# Patient Record
Sex: Female | Born: 1975 | Race: Black or African American | Hispanic: No | Smoking: Former smoker
Health system: Southern US, Community
[De-identification: ages and names within clinical notes are randomized; demographics above are authoritative.]

## PROBLEM LIST (undated history)

## (undated) DIAGNOSIS — E119 Type 2 diabetes mellitus without complications: Secondary | ICD-10-CM

## (undated) DIAGNOSIS — F32A Depression, unspecified: Secondary | ICD-10-CM

## (undated) DIAGNOSIS — G43909 Migraine, unspecified, not intractable, without status migrainosus: Secondary | ICD-10-CM

## (undated) DIAGNOSIS — E785 Hyperlipidemia, unspecified: Secondary | ICD-10-CM

## (undated) DIAGNOSIS — R569 Unspecified convulsions: Secondary | ICD-10-CM

## (undated) DIAGNOSIS — F329 Major depressive disorder, single episode, unspecified: Secondary | ICD-10-CM

## (undated) DIAGNOSIS — I10 Essential (primary) hypertension: Secondary | ICD-10-CM

## (undated) HISTORY — PX: CYST EXCISION: SHX5701

## (undated) HISTORY — DX: Depression, unspecified: F32.A

## (undated) HISTORY — DX: Hyperlipidemia, unspecified: E78.5

## (undated) HISTORY — PX: TUBAL LIGATION: SHX77

## (undated) HISTORY — PX: SHOULDER OPEN ROTATOR CUFF REPAIR: SHX2407

## (undated) HISTORY — PX: CHOLECYSTECTOMY: SHX55

## (undated) HISTORY — PX: TONSILLECTOMY: SUR1361

---
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
## 2003-11-11 ENCOUNTER — Emergency Department (HOSPITAL_COMMUNITY): Admission: EM | Admit: 2003-11-11 | Discharge: 2003-11-12 | Payer: Self-pay | Admitting: Emergency Medicine

## 2005-08-24 ENCOUNTER — Observation Stay (HOSPITAL_COMMUNITY): Admission: AD | Admit: 2005-08-24 | Discharge: 2005-08-25 | Payer: Self-pay | Admitting: Obstetrics & Gynecology

## 2005-08-31 ENCOUNTER — Ambulatory Visit (HOSPITAL_COMMUNITY): Admission: AD | Admit: 2005-08-31 | Discharge: 2005-09-01 | Payer: Self-pay | Admitting: Obstetrics & Gynecology

## 2005-09-22 ENCOUNTER — Inpatient Hospital Stay (HOSPITAL_COMMUNITY): Admission: RE | Admit: 2005-09-22 | Discharge: 2005-09-24 | Payer: Self-pay | Admitting: Obstetrics and Gynecology

## 2005-09-30 ENCOUNTER — Ambulatory Visit (HOSPITAL_COMMUNITY): Admission: AD | Admit: 2005-09-30 | Discharge: 2005-09-30 | Payer: Self-pay | Admitting: Obstetrics & Gynecology

## 2005-10-10 ENCOUNTER — Ambulatory Visit (HOSPITAL_COMMUNITY): Admission: AD | Admit: 2005-10-10 | Discharge: 2005-10-10 | Payer: Self-pay | Admitting: Obstetrics and Gynecology

## 2005-10-13 ENCOUNTER — Ambulatory Visit (HOSPITAL_COMMUNITY): Admission: AD | Admit: 2005-10-13 | Discharge: 2005-10-13 | Payer: Self-pay | Admitting: Obstetrics and Gynecology

## 2005-10-17 ENCOUNTER — Ambulatory Visit (HOSPITAL_COMMUNITY): Admission: AD | Admit: 2005-10-17 | Discharge: 2005-10-17 | Payer: Self-pay | Admitting: Obstetrics & Gynecology

## 2005-10-20 ENCOUNTER — Ambulatory Visit (HOSPITAL_COMMUNITY): Admission: AD | Admit: 2005-10-20 | Discharge: 2005-10-20 | Payer: Self-pay | Admitting: Obstetrics & Gynecology

## 2005-10-24 ENCOUNTER — Ambulatory Visit (HOSPITAL_COMMUNITY): Admission: AD | Admit: 2005-10-24 | Discharge: 2005-10-24 | Payer: Self-pay | Admitting: Obstetrics & Gynecology

## 2005-10-27 ENCOUNTER — Ambulatory Visit (HOSPITAL_COMMUNITY): Admission: AD | Admit: 2005-10-27 | Discharge: 2005-10-27 | Payer: Self-pay | Admitting: Obstetrics & Gynecology

## 2005-10-31 ENCOUNTER — Ambulatory Visit (HOSPITAL_COMMUNITY): Admission: AD | Admit: 2005-10-31 | Discharge: 2005-10-31 | Payer: Self-pay | Admitting: Obstetrics & Gynecology

## 2005-11-03 ENCOUNTER — Ambulatory Visit (HOSPITAL_COMMUNITY): Admission: AD | Admit: 2005-11-03 | Discharge: 2005-11-03 | Payer: Self-pay | Admitting: Obstetrics & Gynecology

## 2005-11-10 ENCOUNTER — Ambulatory Visit (HOSPITAL_COMMUNITY): Admission: AD | Admit: 2005-11-10 | Discharge: 2005-11-10 | Payer: Self-pay | Admitting: Obstetrics & Gynecology

## 2005-11-16 ENCOUNTER — Encounter (INDEPENDENT_AMBULATORY_CARE_PROVIDER_SITE_OTHER): Payer: Self-pay | Admitting: Specialist

## 2005-11-16 ENCOUNTER — Inpatient Hospital Stay (HOSPITAL_COMMUNITY): Admission: RE | Admit: 2005-11-16 | Discharge: 2005-11-19 | Payer: Self-pay | Admitting: Obstetrics & Gynecology

## 2006-08-26 ENCOUNTER — Emergency Department (HOSPITAL_COMMUNITY): Admission: EM | Admit: 2006-08-26 | Discharge: 2006-08-26 | Payer: Self-pay | Admitting: Emergency Medicine

## 2008-05-01 ENCOUNTER — Ambulatory Visit: Payer: Self-pay | Admitting: Orthopedic Surgery

## 2008-05-01 DIAGNOSIS — M7580 Other shoulder lesions, unspecified shoulder: Secondary | ICD-10-CM

## 2008-05-01 DIAGNOSIS — M25519 Pain in unspecified shoulder: Secondary | ICD-10-CM | POA: Insufficient documentation

## 2008-06-02 ENCOUNTER — Encounter (HOSPITAL_COMMUNITY): Admission: RE | Admit: 2008-06-02 | Discharge: 2008-07-03 | Payer: Self-pay | Admitting: Orthopedic Surgery

## 2008-06-11 ENCOUNTER — Encounter: Payer: Self-pay | Admitting: Orthopedic Surgery

## 2008-06-27 ENCOUNTER — Encounter: Payer: Self-pay | Admitting: Orthopedic Surgery

## 2008-06-30 ENCOUNTER — Ambulatory Visit: Payer: Self-pay | Admitting: Orthopedic Surgery

## 2008-07-08 ENCOUNTER — Encounter (HOSPITAL_COMMUNITY): Admission: RE | Admit: 2008-07-08 | Discharge: 2008-08-07 | Payer: Self-pay | Admitting: Orthopedic Surgery

## 2008-07-14 ENCOUNTER — Encounter: Payer: Self-pay | Admitting: Orthopedic Surgery

## 2008-07-22 ENCOUNTER — Ambulatory Visit (HOSPITAL_COMMUNITY): Admission: RE | Admit: 2008-07-22 | Discharge: 2008-07-22 | Payer: Self-pay | Admitting: Family Medicine

## 2008-09-09 ENCOUNTER — Ambulatory Visit: Payer: Self-pay | Admitting: Orthopedic Surgery

## 2008-09-12 ENCOUNTER — Encounter (INDEPENDENT_AMBULATORY_CARE_PROVIDER_SITE_OTHER): Payer: Self-pay | Admitting: *Deleted

## 2008-09-12 ENCOUNTER — Encounter: Payer: Self-pay | Admitting: Orthopedic Surgery

## 2008-09-15 ENCOUNTER — Ambulatory Visit (HOSPITAL_COMMUNITY): Admission: RE | Admit: 2008-09-15 | Discharge: 2008-09-15 | Payer: Self-pay | Admitting: Orthopedic Surgery

## 2008-09-15 ENCOUNTER — Encounter: Payer: Self-pay | Admitting: Orthopedic Surgery

## 2008-09-19 ENCOUNTER — Encounter: Payer: Self-pay | Admitting: Orthopedic Surgery

## 2008-09-22 ENCOUNTER — Ambulatory Visit: Payer: Self-pay | Admitting: Orthopedic Surgery

## 2008-09-25 ENCOUNTER — Telehealth: Payer: Self-pay | Admitting: Orthopedic Surgery

## 2008-09-30 ENCOUNTER — Ambulatory Visit: Payer: Self-pay | Admitting: Orthopedic Surgery

## 2008-09-30 ENCOUNTER — Ambulatory Visit (HOSPITAL_COMMUNITY): Admission: RE | Admit: 2008-09-30 | Discharge: 2008-09-30 | Payer: Self-pay | Admitting: Orthopedic Surgery

## 2008-10-02 ENCOUNTER — Ambulatory Visit: Payer: Self-pay | Admitting: Orthopedic Surgery

## 2008-10-06 ENCOUNTER — Encounter (HOSPITAL_COMMUNITY): Admission: RE | Admit: 2008-10-06 | Discharge: 2008-11-05 | Payer: Self-pay | Admitting: Orthopedic Surgery

## 2008-10-06 ENCOUNTER — Encounter: Payer: Self-pay | Admitting: Orthopedic Surgery

## 2008-10-15 ENCOUNTER — Telehealth: Payer: Self-pay | Admitting: Orthopedic Surgery

## 2008-10-15 ENCOUNTER — Encounter: Payer: Self-pay | Admitting: Orthopedic Surgery

## 2008-10-16 ENCOUNTER — Telehealth: Payer: Self-pay | Admitting: Orthopedic Surgery

## 2008-10-20 ENCOUNTER — Ambulatory Visit: Payer: Self-pay | Admitting: Orthopedic Surgery

## 2008-10-22 ENCOUNTER — Emergency Department (HOSPITAL_COMMUNITY): Admission: EM | Admit: 2008-10-22 | Discharge: 2008-10-22 | Payer: Self-pay | Admitting: Emergency Medicine

## 2008-10-24 ENCOUNTER — Encounter: Payer: Self-pay | Admitting: Orthopedic Surgery

## 2008-10-27 ENCOUNTER — Ambulatory Visit: Payer: Self-pay | Admitting: Orthopedic Surgery

## 2008-10-27 DIAGNOSIS — M19019 Primary osteoarthritis, unspecified shoulder: Secondary | ICD-10-CM | POA: Insufficient documentation

## 2008-11-04 ENCOUNTER — Ambulatory Visit: Payer: Self-pay | Admitting: Orthopedic Surgery

## 2008-11-11 ENCOUNTER — Encounter (HOSPITAL_COMMUNITY): Admission: RE | Admit: 2008-11-11 | Discharge: 2008-12-04 | Payer: Self-pay | Admitting: Orthopedic Surgery

## 2008-11-12 ENCOUNTER — Encounter: Payer: Self-pay | Admitting: Orthopedic Surgery

## 2008-12-01 ENCOUNTER — Ambulatory Visit: Payer: Self-pay | Admitting: Orthopedic Surgery

## 2009-01-13 ENCOUNTER — Ambulatory Visit (HOSPITAL_COMMUNITY): Admission: RE | Admit: 2009-01-13 | Discharge: 2009-01-13 | Payer: Self-pay | Admitting: Neurology

## 2009-01-19 ENCOUNTER — Encounter: Admission: RE | Admit: 2009-01-19 | Discharge: 2009-01-19 | Payer: Self-pay | Admitting: Neurology

## 2009-01-19 ENCOUNTER — Ambulatory Visit: Admission: RE | Admit: 2009-01-19 | Discharge: 2009-01-19 | Payer: Self-pay

## 2009-02-20 ENCOUNTER — Encounter: Payer: Self-pay | Admitting: Orthopedic Surgery

## 2010-06-13 LAB — BASIC METABOLIC PANEL
BUN: 9 mg/dL (ref 6–23)
Creatinine, Ser: 0.69 mg/dL (ref 0.4–1.2)
GFR calc Af Amer: >60 mL/min (ref >60)
Sodium: 132 mEq/L — ABNORMAL LOW (ref 135–145)

## 2010-06-13 LAB — CBC
HCT: 36.5 % (ref 36.0–46.0)
Hemoglobin: 12.7 g/dL (ref 12.0–15.0)
MCHC: 34.8 g/dL (ref 30.0–36.0)
MCV: 90.1 fL (ref 78.0–100.0)
Platelets: 292 10*3/uL (ref 150–400)
RBC: 4.05 MIL/uL (ref 3.87–5.11)
RDW: 13.9 % (ref 11.5–15.5)
WBC: 5.9 10*3/uL (ref 4.0–10.5)

## 2010-07-20 NOTE — Op Note (Signed)
Mariah Miller, GREENLY NO.:  [id number]   MEDICAL RECORD NO.:  [id number]          PATIENT TYPE:  AMB   LOCATION:  DAY                           FACILITY:  [facility]   PHYSICIAN:  Vickki Hearing, M.D.DATE OF BIRTH:  09/07/1975   DATE OF PROCEDURE:  09/30/2008  DATE OF DISCHARGE:                               OPERATIVE REPORT   PREOPERATIVE DIAGNOSIS:  Impingement syndrome, left shoulder.   POSTOPERATIVE DIAGNOSIS:  Impingement syndrome, left shoulder.   PROCEDURE:  Arthroscopic subacromial decompression, left shoulder.   SURGEON:  Vickki Hearing, MD   ASSISTANTS:  None.   ANESTHETIC:  General.   FINDINGS:  Abrasion of the superior bursal side of the rotator cuff;  normal glenohumeral joint bursitis, left shoulder; impingement, left  shoulder.   SPECIMENS:  None.   BLOOD LOSS:  Minimal.   COMPLICATIONS:  None.   COUNTS:  Correct.   The patient went to PACU in good condition.   HISTORY:  This is a 35 year old female.  She had pain in her left  shoulder.  She was treated with injection, physical therapy, Relafen,  Norco, did not improve.  She had an MRI, showed a focal area of  intrasubstance tendonitis in the supraspinatus tendon and subdeltoid  bursitis.  Because she did not improve, we offered her surgical  treatment.  She agreed.   Informed consent was done in the office.   This patient was identified as Santiago Glad in the preop area.  The  left shoulder was marked as a surgical site, countersigned.  History and  physical was updated.  The patient was given a gram of Ancef, taken to  Surgery for general anesthesia.  She was placed in a modified beach-  chair position, left shoulder was prepped with chlorhexidine and draped  sterilely.  Time-out procedure was completed.   Posterior portal was made in the standard fashion.  Scope was introduced  into the glenohumeral joint.  Diagnostic arthroscopy was performed.  Glenohumeral joint  was normal including the biceps anchor, glenohumeral  articular surfaces, glenoid labrum, axillary pouch, rotator cuff,  subscapularis.   A second portal was then made in a lateral superior position and the  scope was placed in the subacromial space, where diagnostic arthroscopy  was again done there.  A lateral portal was established.  A  decompression was performed using a shaver and ArthroCare wand and a  barrel-shaped bur.  The cuff was examined in internal and external  rotation as well as abduction and there was a superior abrasion at the  lateral edge of the acromion, which was most marked by forward elevation  and internal rotation.   This was also debrided.   The wounds were then closed with 3-0 nylon and Steri-Strips.  Marcaine  30 mL was injected in the subacromial space.  The patient was placed in  a Cryo/Cuff and extubated and taken to recovery room.  Postop plan is  for physical therapy to start on Friday.  She will be discharged home  today.   DISCHARGE MEDICINES:  She will resume all preop medications.  She will  be given some Lorcet Plus 1 q.4 p.r.n. pain #42 with 5 refills and  Phenergan 25 mg q.6 h. p.r.n. nausea #28.      Vickki Hearing, M.D.  Electronically Signed     SEH/MEDQ  D:  09/30/2008  T:  09/30/2008  Job:  [id number]

## 2010-07-23 NOTE — Discharge Summary (Signed)
Mariah Miller, GEDDIS NO.:  [id number]   MEDICAL RECORD NO.:  [id number]          PATIENT TYPE:  OIB   LOCATION:  LDR5                          FACILITY:  [facility]   PHYSICIAN:  Lazaro Arms, M.D.   DATE OF BIRTH:  08-31-1975   DATE OF ADMISSION:  09/22/2005  DATE OF DISCHARGE:  07/21/2007LH                                 DISCHARGE SUMMARY   DISCHARGE DIAGNOSES:  1.  Intrauterine pregnancy at 30-weeks' gestation.  2.  Class C diabetic.  3.  Marginal sinus abruption, resolved.   Please refer to the transcribed history and physical as well as the  intrapartum chart and details of admission to the hospital.   HOSPITAL COURSE:  The patient was admitted and had one episode of bleeding  with some right sided discomfort. She was placed on observation. Bleeding  really had stopped before she got to the hospital. She did not any at all  here. She had a reactive NST, a little bit of mild uterine activity that was  not significant. She had an ultrasound which was normal, normal fluid,  normal growth parameters, actually slightly advanced growth for the  gestation consistent with diabetes, and her leading edge of the placenta was  not well visualized, but there was no evidence of a retroplacental clot. Her  laboratory data were normal, except for a very small elevation of the  Kleihauer-Betke of 0.1% which would be consistent with the 5 cc fetal to  maternal hemorrhage. Again, the patient was observed for nearly 72 hours,  had no further bleeding, had reactive NSTs several times. She had  significant diminishment in her pain. Her blood sugars were under variable  control. They are much better at home, and her A1c has dropped  significantly, so I am going to keep her on her usual dose. As a result, she  was discharged to home on the morning of September 24, 2005 to follow up in the  office on Wednesday. She was given strict instructions and precautions  to  return should she have  any more bleeding or any other problems to please  contact us. She voices understanding.      Lazaro Arms, M.D.  Electronically Signed     LHE/MEDQ  D:  09/24/2005  T:  09/24/2005  Job:  [id number]

## 2010-07-23 NOTE — H&P (Signed)
NAME:  Mariah Miller, Mariah Miller NO.:  [id number]   MEDICAL RECORD NO.:  [id number]          PATIENT TYPE:  OIB   LOCATION:  LDR5                          FACILITY:  [facility]   PHYSICIAN:  Tilda Burrow, M.D. DATE OF BIRTH:  02-18-1976   DATE OF ADMISSION:  09/22/2005  DATE OF DISCHARGE:  LH                                HISTORY & PHYSICAL   HISTORY OF PRESENT ILLNESS:  Mariah Miller is a 35 year old gravida 5, para 2, who  was an insulin-dependent diabetic since age 32.  She states she was at work  and had this sharp stabbing pain in her side which was horrendous in her  words.  When she was able to straighten up and walk to the bathroom at the  time she sat down, she had a large gush of bright bleed bleeding.  The pain  continued so she presented to the hospital.   MEDICAL HISTORY:  She is a insulin-dependent diabetic since age 66.   SURGICAL HISTORY:  Positive for gallbladder and C-section.   FAMILY HISTORY:  Positive for hypertension, diabetes, stroke, coronary  artery disease.   Prenatal course has been complicated by an elevated hemoglobin A1c which is  down to 7.2 with last check.  Blood type is O positive.  UDS negative.  Rubella immune.  Hepatitis B surface antigen negative.  HIV nonreactive.  Serologies nonreactive.  Pap is normal.  GC and Chlamydia normal.  AFP  normal.  Hemoglobin A1c initially was 10.2 which 3 months later with  management dropped to 7.2.  Sickle cell screen is negative.  GC and  Chlamydia on repeat is also negative.   PHYSICAL EXAMINATION TODAY:  VITAL SIGNS:  Stable.  HERAT:  Regular rhythm and rate.  LUNGS:  Clear to auscultation bilaterally.  Fetus is stable on external fetal monitor.  ABDOMEN:  Slightly tender to palpation.   PLAN:  I discussed plan and care with Dr. Despina Hidden.  He wrote orders for the  patient in regard to insulin dosage and labs.  She is to have an ultrasound  and labs.  She is to be admitted until all labs are in and  patient is  stable.     Zerita Boers, Lanier Clam      Tilda Burrow, M.D.  Electronically Signed   DL/MEDQ  D:  16/12/9602  T:  09/22/2005  Job:  [id number]

## 2010-07-23 NOTE — Discharge Summary (Signed)
[name], Mariah Miller NO.:  [id number]   MEDICAL RECORD NO.:  [id number]          PATIENT TYPE:  INP   LOCATION:  A401                          FACILITY:  [facility]   PHYSICIAN:  Tilda Burrow, M.D. DATE OF BIRTH:  Feb 09, 1976   DATE OF ADMISSION:  11/16/2005  DATE OF DISCHARGE:  09/15/2007LH                                 DISCHARGE SUMMARY   ADMITTING DIAGNOSES:  1. Pregnancy at 38-and-a-half weeks gestation.  2. Class C diabetes mellitus.  3. Prior cesarean section.  4. Desire for elective permanent sterilization.   DISCHARGE DIAGNOSES:  1. Pregnancy at 38-and-a-half weeks, delivered.  2. Class C diabetes.  3. Repeat cesarean section.  4. Elective sterilization.  5. Postoperative anemia, stable.   DISCHARGE MEDICATIONS:  1. Lantus 23 units q.h.s.  2. Humulin NPH 13 units subcu q.a.m.  3. Humulin R 10 units q.a.m., 17 units q.p.m.  4. Prenatal vitamins one daily.  5. Vicodin 5/500 one q.4h. p.r.n. pain, dispense #30.  6. Motrin 800 mg one q.8h. for cramps.   FOLLOWUP:  November 23, 2005, at 2 p.m. - incision check.   HOSPITAL SUMMARY:  This 35 year old female was admitted for repeat cesarean  section and tubal ligation after pregnancy followed [facility] as  documented in the accompanying prenatal records.  Final pregnancy insulin  doses were 40 units Lantus at bedtime; 26 units NPH and 20 units of regular  in the morning; 26 units of regular in the evenings.   HOSPITAL COURSE:  The patient was admitted, underwent repeat cesarean  section and tubal ligation by Dr. Turner Daniels on November 16, 2005; see his  operative notes and history.  Infant was a healthy female infant, Apgars 8 and  9.  A 500-cc blood loss was estimated.   Admission hemoglobin 11.5, hematocrit 33.6; postoperative hemoglobin 9.0,  hematocrit 26.2.  The patient had maternal blood type O positive.  Was  followed postoperatively uneventfully with reduction in blood sugars due to  the patient's honeymooning period.  She was managed on half of her pre-  delivery insulin regimen and will be discharged home on that dose to  maintain blood sugar monitoring and bring those results to Dr. Forestine Chute visit  on September 19.  Routine post surgical instructions given.      Tilda Burrow, M.D.  Electronically Signed     JVF/MEDQ  D:  11/19/2005  T:  11/21/2005  Job:  [id number]

## 2010-07-23 NOTE — Op Note (Signed)
NAMESHAQUILLA, KEHRES NO.:  [id number]   MEDICAL RECORD NO.:  [id number]          PATIENT TYPE:  INP   LOCATION:  A401                          FACILITY:  [facility]   PHYSICIAN:  Lazaro Arms, M.D.   DATE OF BIRTH:  Oct 11, 1975   DATE OF PROCEDURE:  11/16/2005  DATE OF DISCHARGE:                                 OPERATIVE REPORT   PREOPERATIVE DIAGNOSES:  1. Intrauterine pregnancy at 38-1/2 weeks' gestation.  2. Previous cesarean section.  3. Desires sterilization.  4. Class C diabetes.   POSTOPERATIVE DIAGNOSES:  1. Intrauterine pregnancy at 38-1/2 weeks' gestation.  2. Previous cesarean section.  3. Desires sterilization.  4. Class C diabetes.   PROCEDURES:  1. Repeat cesarean section.  2. Bilateral tubal ligation.   SURGEON:  Lazaro Arms, M.D.   ANESTHESIA:  Spinal.   FINDINGS:  Over a low transverse hysterotomy incision was delivered a viable  female infant at 60 with Apgars of 8 and 9, weight will be determined in the  nursery.  There was a 3-vessel cord.  Cord blood and cord gas were sent.  Placenta was delivered spontaneously and was intact.  It was sent for  evaluation.  The infant underwent routine neonatal resuscitation.   DESCRIPTION OF OPERATION:  The patient was taken to the operating room and  placed in the sitting position and underwent a spinal anesthetic, then  placed in the supine position with a roll under the right hip.  She was  prepped and draped in the usual sterile fashion.  A Pfannenstiel skin  incision was made and carried down sharply to the rectus fascia, scored in  the midline and extended laterally.  The fascia was taken off the muscles  superiorly and inferiorly without difficulty.  The muscles were divided, the  peritoneal cavity was entered, the bladder blade was placed.  A  vesicouterine serosal flap was created.  A low transverse uterine incision  was made.  Over the incision was delivered a viable female infant.  A  vacuum  assist was performed.  A 3-vessel cord was seen and cord blood and cord gas  were sent.  The infant underwent routine neonatal resuscitation by Dr. Boyd Kerbs, who was in attendance.  The placenta was delivered spontaneously.  The uterus was exteriorized, closed in 2 layers after being wiped down, the  first being a running interlocking layer and the second being an imbricating  layer.  There was good hemostasis.  The fallopian tubes were grasped with  Babcock clamps and a modified Pomeroy bilateral tubal ligation was performed  using 0 plain, approximately 2 cm was removed bilaterally.  There was good  hemostasis.  The uterus was replaced in the peritoneal cavity.  The pelvis  was irrigated vigorously, all pedicles found to be hemostatic, including the  tubal pedicles.  The muscles and peritoneum were reapproximated loosely, the  fascia closed using 0 Vicryl running.  The subcutaneous tissue was made  hemostatic and irrigated.  The skin was closed using skin staples.  The  patient tolerated the procedure well.  She experienced 500 mL  of blood loss  and was taken to the recovery room in good, stable condition.  All counts  were correct.      Lazaro Arms, M.D.  Electronically Signed     LHE/MEDQ  D:  11/16/2005  T:  11/16/2005  Job:  [id number]

## 2010-12-22 LAB — DIFFERENTIAL
Basophils Absolute: 0
Lymphocytes Relative: 30
Lymphs Abs: 1.9
Monocytes Absolute: 0.5
Monocytes Relative: 8

## 2010-12-22 LAB — CBC
HCT: 36.1
MCHC: 34.9
MCV: 88.4
WBC: 6.4

## 2010-12-22 LAB — BASIC METABOLIC PANEL
CO2: 25
Calcium: 9.1
GFR calc Af Amer: >60
GFR calc non Af Amer: >60
Glucose, Bld: 238 — ABNORMAL HIGH

## 2012-06-13 ENCOUNTER — Encounter (HOSPITAL_COMMUNITY): Payer: Self-pay | Admitting: Family Medicine

## 2012-06-13 ENCOUNTER — Emergency Department (HOSPITAL_COMMUNITY): Payer: Self-pay

## 2012-06-13 ENCOUNTER — Emergency Department (HOSPITAL_COMMUNITY)
Admission: EM | Admit: 2012-06-13 | Discharge: 2012-06-13 | Disposition: A | Discharge status: Home / Self Care | Payer: Self-pay | Attending: Emergency Medicine | Admitting: Emergency Medicine

## 2012-06-13 DIAGNOSIS — I10 Essential (primary) hypertension: Secondary | ICD-10-CM | POA: Insufficient documentation

## 2012-06-13 DIAGNOSIS — R29810 Facial weakness: Secondary | ICD-10-CM | POA: Insufficient documentation

## 2012-06-13 DIAGNOSIS — Z79899 Other long term (current) drug therapy: Secondary | ICD-10-CM | POA: Insufficient documentation

## 2012-06-13 DIAGNOSIS — R202 Paresthesia of skin: Secondary | ICD-10-CM

## 2012-06-13 DIAGNOSIS — Z794 Long term (current) use of insulin: Secondary | ICD-10-CM | POA: Insufficient documentation

## 2012-06-13 DIAGNOSIS — R200 Anesthesia of skin: Secondary | ICD-10-CM

## 2012-06-13 DIAGNOSIS — R4789 Other speech disturbances: Secondary | ICD-10-CM | POA: Insufficient documentation

## 2012-06-13 DIAGNOSIS — R209 Unspecified disturbances of skin sensation: Secondary | ICD-10-CM

## 2012-06-13 DIAGNOSIS — E119 Type 2 diabetes mellitus without complications: Secondary | ICD-10-CM | POA: Insufficient documentation

## 2012-06-13 DIAGNOSIS — R51 Headache: Secondary | ICD-10-CM

## 2012-06-13 HISTORY — DX: Type 2 diabetes mellitus without complications: E11.9

## 2012-06-13 HISTORY — DX: Essential (primary) hypertension: I10

## 2012-06-13 LAB — PROTIME-INR
INR: 0.9 (ref 0.00–1.49)
Prothrombin Time: 12.1 seconds (ref 11.6–15.2)

## 2012-06-13 LAB — CBC
Hemoglobin: 13.5 g/dL (ref 12.0–15.0)
MCH: 31.1 pg (ref 26.0–34.0)
Platelets: 317 10*3/uL (ref 150–400)
RBC: 4.34 MIL/uL (ref 3.87–5.11)
WBC: 7.1 10*3/uL (ref 4.0–10.5)

## 2012-06-13 LAB — POCT I-STAT, CHEM 8
BUN: 7 mg/dL (ref 6–23)
Calcium, Ion: 1.25 mmol/L — ABNORMAL HIGH (ref 1.12–1.23)
Chloride: 103 mEq/L (ref 96–112)
Creatinine, Ser: 0.7 mg/dL (ref 0.50–1.10)
Glucose, Bld: 381 mg/dL — ABNORMAL HIGH (ref 70–99)

## 2012-06-13 LAB — DIFFERENTIAL
Lymphocytes Relative: 37 % (ref 12–46)
Lymphs Abs: 2.6 10*3/uL (ref 0.7–4.0)
Monocytes Relative: 7 % (ref 3–12)
Neutrophils Relative %: 54 % (ref 43–77)

## 2012-06-13 LAB — RAPID URINE DRUG SCREEN, HOSP PERFORMED
Amphetamines: NOT DETECTED
Benzodiazepines: NOT DETECTED
Cocaine: NOT DETECTED
Opiates: NOT DETECTED
Tetrahydrocannabinol: NOT DETECTED

## 2012-06-13 LAB — POCT PREGNANCY, URINE: Preg Test, Ur: NEGATIVE

## 2012-06-13 LAB — URINALYSIS, ROUTINE W REFLEX MICROSCOPIC
Bilirubin Urine: NEGATIVE
Glucose, UA: >1000 mg/dL — AB
Hgb urine dipstick: NEGATIVE
Ketones, ur: 40 mg/dL — AB
Protein, ur: NEGATIVE mg/dL
Urobilinogen, UA: 0.2 mg/dL (ref 0.0–1.0)

## 2012-06-13 LAB — GLUCOSE, CAPILLARY: Glucose-Capillary: 369 mg/dL — ABNORMAL HIGH (ref 70–99)

## 2012-06-13 LAB — URINE MICROSCOPIC-ADD ON

## 2012-06-13 LAB — TROPONIN I: Troponin I: <0.3 ng/mL (ref <0.30)

## 2012-06-13 MED ORDER — ASPIRIN EC 81 MG PO TBEC
81.0000 mg | DELAYED_RELEASE_TABLET | Freq: Every day | ORAL | Status: DC
  Administered 2012-06-13: 81 mg via ORAL
  Filled 2012-06-13: qty 1

## 2012-06-13 MED ORDER — SODIUM CHLORIDE 0.9 % IV BOLUS (SEPSIS)
1000.0000 mL | Freq: Once | INTRAVENOUS | Status: AC
  Administered 2012-06-13: 1000 mL via INTRAVENOUS

## 2012-06-13 MED ORDER — INSULIN ASPART 100 UNIT/ML ~~LOC~~ SOLN
10.0000 [IU] | Freq: Once | SUBCUTANEOUS | Status: AC
  Administered 2012-06-13: 10 [IU] via INTRAVENOUS
  Filled 2012-06-13: qty 1

## 2012-06-13 NOTE — ED Notes (Signed)
Dr. Reynolds at bedside.

## 2012-06-13 NOTE — Consult Note (Signed)
Referring Physician: Ray     Chief Complaint: left leg numbness, left facial droop,HA  HPI:                                                                                                                                         Mariah Miller is an 37 y.o. female with known uncontrolled DM (admitting BG 381), and HTN. Patient has no history of sickle cell disease in family or known clotting deficiency in family. Patietn has no history of migraine and denies HA in past.  She noted la left supraorbital HA about 11 last night, about 0000 hours she felt like her face was "melting/drooping" on the left cheek. She went to sleep and awoke with continued HA. Her sister went to her house to pick her up and noted her speech was "drawn out" and her left part of her face --"cheek" looked swollen. Shew was brought to ED for further evaluation. On entering ED she felt her left leg had decreased sensation from "knee to foot".  Currently her HA is a 4/10 and significantly improved, she no longer has facial or leg symptoms.    Date last known well: 4.8.14 Time last known well: 10 pm tPA Given: No: out of window and resolved symptoms  Past Medical History  Diagnosis Date  . Hypertension   . Diabetes mellitus without complication     Past surgical history: left subacromial decompression  Family history: M: lupus, HTN, thyroid problems F: unknown  Social History:  reports that she has never smoked. She does not have any smokeless tobacco history on file. She reports that she does not drink alcohol. Her drug history is not on file.  Allergies: No Known Allergies  Medications:                                                                                                                          No current facility-administered medications for this encounter.   Current Outpatient Prescriptions  Medication Sig Dispense Refill  . insulin glargine (LANTUS) 100 UNIT/ML injection Inject 75 Units into the  skin at bedtime.      . insulin lispro (HUMALOG) 100 UNIT/ML injection Inject 10-20 Units into the skin 5 (five) times daily. SSI. Patient has uncontrolled diabetes and may take SSI up to five times daily.      Marland Kitchen lisinopril-hydrochlorothiazide (PRINZIDE,ZESTORETIC)  20-12.5 MG per tablet Take 1 tablet by mouth daily.      . metFORMIN (GLUCOPHAGE) 1000 MG tablet Take 1,000 mg by mouth 2 (two) times daily.         ROS:                                                                                                                                       History obtained from the patient  General ROS: negative for - chills, fatigue, fever, night sweats, weight gain or weight loss Psychological ROS: negative for - behavioral disorder, hallucinations, memory difficulties, mood swings or suicidal ideation Ophthalmic ROS: negative for - blurry vision, double vision, eye pain or loss of vision ENT ROS: negative for - epistaxis, nasal discharge, oral lesions, sore throat, tinnitus or vertigo Allergy and Immunology ROS: negative for - hives or itchy/watery eyes Hematological and Lymphatic ROS: negative for - bleeding problems, bruising or swollen lymph nodes Endocrine ROS: negative for - galactorrhea, hair pattern changes, polydipsia/polyuria or temperature intolerance Respiratory ROS: negative for - cough, hemoptysis, shortness of breath or wheezing Cardiovascular ROS: negative for - chest pain, dyspnea on exertion, edema or irregular heartbeat Gastrointestinal ROS: negative for - abdominal pain, diarrhea, hematemesis, nausea/vomiting or stool incontinence Genito-Urinary ROS: negative for - dysuria, hematuria, incontinence or urinary frequency/urgency Musculoskeletal ROS: negative for - joint swelling or muscular weakness Neurological ROS: as noted in HPI Dermatological ROS: negative for rash and skin lesion changes  Neurologic Examination:                                                                                                       Blood pressure 178/103, pulse 82, temperature 98.7 F (37.1 C), temperature source Oral, resp. rate 18, last menstrual period 06/02/2012, SpO2 100.00%. Mental Status: Alert, oriented, thought content appropriate.  Speech fluent without evidence of aphasia.  Able to follow 3 step commands without difficulty. Cranial Nerves: II: Discs flat bilaterally; Visual fields grossly normal, pupils equal, round, reactive to light and accommodation III,IV, VI: ptosis not present, extra-ocular motions intact bilaterally V,VII: smile symmetric, facial light touch sensation normal bilaterally VIII: hearing normal bilaterally IX,X: gag reflex present XI: bilateral shoulder shrug XII: midline tongue extension Motor: Right : Upper extremity   5/5    Left:     Upper extremity   5/5  Lower extremity   5/5     Lower extremity   5/5 Tone and bulk:normal tone throughout; no atrophy noted Sensory: Pinprick  and light touch intact throughout, bilaterally Deep Tendon Reflexes: 2+ and symmetric throughout Plantars: Right: downgoing   Left: downgoing Cerebellar: normal finger-to-nose,  normal heel-to-shin test CV: pulses palpable throughout    Results for orders placed during the hospital encounter of 06/13/12 (from the past 48 hour(s))  GLUCOSE, CAPILLARY     Status: Abnormal   Collection Time    06/13/12 10:04 AM      Result Value Range   Glucose-Capillary 369 (*) 70 - 99 mg/dL  ETHANOL     Status: None   Collection Time    06/13/12 10:07 AM      Result Value Range   Alcohol, Ethyl (B) <11  0 - 11 mg/dL   Comment:            LOWEST DETECTABLE LIMIT FOR     SERUM ALCOHOL IS 11 mg/dL     FOR MEDICAL PURPOSES ONLY  PROTIME-INR     Status: None   Collection Time    06/13/12 10:07 AM      Result Value Range   Prothrombin Time 12.1  11.6 - 15.2 seconds   INR 0.90  0.00 - 1.49  APTT     Status: None   Collection Time    06/13/12 10:07 AM      Result Value Range    aPTT 30  24 - 37 seconds  CBC     Status: None   Collection Time    06/13/12 10:07 AM      Result Value Range   WBC 7.1  4.0 - 10.5 K/uL   RBC 4.34  3.87 - 5.11 MIL/uL   Hemoglobin 13.5  12.0 - 15.0 g/dL   HCT 16.1  09.6 - 04.5 %   MCV 88.0  78.0 - 100.0 fL   MCH 31.1  26.0 - 34.0 pg   MCHC 35.3  30.0 - 36.0 g/dL   RDW 40.9  81.1 - 91.4 %   Platelets 317  150 - 400 K/uL  DIFFERENTIAL     Status: None   Collection Time    06/13/12 10:07 AM      Result Value Range   Neutrophils Relative 54  43 - 77 %   Neutro Abs 3.8  1.7 - 7.7 K/uL   Lymphocytes Relative 37  12 - 46 %   Lymphs Abs 2.6  0.7 - 4.0 K/uL   Monocytes Relative 7  3 - 12 %   Monocytes Absolute 0.5  0.1 - 1.0 K/uL   Eosinophils Relative 1  0 - 5 %   Eosinophils Absolute 0.1  0.0 - 0.7 K/uL   Basophils Relative 0  0 - 1 %   Basophils Absolute 0.0  0.0 - 0.1 K/uL  TROPONIN I     Status: None   Collection Time    06/13/12 10:07 AM      Result Value Range   Troponin I <0.30  <0.30 ng/mL   Comment:            Due to the release kinetics of cTnI,     a negative result within the first hours     of the onset of symptoms does not rule out     myocardial infarction with certainty.     If myocardial infarction is still suspected,     repeat the test at appropriate intervals.  POCT I-STAT, CHEM 8     Status: Abnormal   Collection Time    06/13/12 10:51 AM  Result Value Range   Sodium 137  135 - 145 mEq/L   Potassium 4.4  3.5 - 5.1 mEq/L   Chloride 103  96 - 112 mEq/L   BUN 7  6 - 23 mg/dL   Creatinine, Ser 1.61  0.50 - 1.10 mg/dL   Glucose, Bld 096 (*) 70 - 99 mg/dL   Calcium, Ion 0.45 (*) 1.12 - 1.23 mmol/L   TCO2 24  0 - 100 mmol/L   Hemoglobin 13.9  12.0 - 15.0 g/dL   HCT 40.9  81.1 - 91.4 %  URINE RAPID DRUG SCREEN (HOSP PERFORMED)     Status: None   Collection Time    06/13/12 11:19 AM      Result Value Range   Opiates NONE DETECTED  NONE DETECTED   Cocaine NONE DETECTED  NONE DETECTED    Benzodiazepines NONE DETECTED  NONE DETECTED   Amphetamines NONE DETECTED  NONE DETECTED   Tetrahydrocannabinol NONE DETECTED  NONE DETECTED   Barbiturates NONE DETECTED  NONE DETECTED   Comment:            DRUG SCREEN FOR MEDICAL PURPOSES     ONLY.  IF CONFIRMATION IS NEEDED     FOR ANY PURPOSE, NOTIFY LAB     WITHIN 5 DAYS.                LOWEST DETECTABLE LIMITS     FOR URINE DRUG SCREEN     Drug Class       Cutoff (ng/mL)     Amphetamine      1000     Barbiturate      200     Benzodiazepine   200     Tricyclics       300     Opiates          300     Cocaine          300     THC              50  URINALYSIS, ROUTINE W REFLEX MICROSCOPIC     Status: Abnormal   Collection Time    06/13/12 11:19 AM      Result Value Range   Color, Urine YELLOW  YELLOW   APPearance CLEAR  CLEAR   Specific Gravity, Urine 1.044 (*) 1.005 - 1.030   pH 5.5  5.0 - 8.0   Glucose, UA >1000 (*) NEGATIVE mg/dL   Hgb urine dipstick NEGATIVE  NEGATIVE   Bilirubin Urine NEGATIVE  NEGATIVE   Ketones, ur 40 (*) NEGATIVE mg/dL   Protein, ur NEGATIVE  NEGATIVE mg/dL   Urobilinogen, UA 0.2  0.0 - 1.0 mg/dL   Nitrite NEGATIVE  NEGATIVE   Leukocytes, UA NEGATIVE  NEGATIVE  URINE MICROSCOPIC-ADD ON     Status: Abnormal   Collection Time    06/13/12 11:19 AM      Result Value Range   Squamous Epithelial / LPF FEW (*) RARE   WBC, UA 0-2  <3 WBC/hpf   Urine-Other TRICHOMONAS PRESENT    POCT PREGNANCY, URINE     Status: None   Collection Time    06/13/12 11:40 AM      Result Value Range   Preg Test, Ur NEGATIVE  NEGATIVE   Comment:            THE SENSITIVITY OF THIS     METHODOLOGY IS >24 mIU/mL   Ct Head Wo Contrast  06/13/2012  *RADIOLOGY REPORT*  Clinical Data: Left sided pain and left leg numbness.  CT HEAD WITHOUT CONTRAST  Technique:  Contiguous axial images were obtained from the base of the skull through the vertex without contrast.  Comparison: MR brain 01/19/2009 and CT head 08/26/2006.  Findings:  No evidence of acute infarct, acute hemorrhage, mass lesion, mass effect or hydrocephalus.  Visualized portions of the paranasal sinuses and mastoid air cells are clear.  IMPRESSION: Negative.   Original Report Authenticated By: Leanna Battles, M.D.     Assessment and plan discussed with with attending physician and they are in agreement.    Felicie Morn PA-C [facility] [phone]  06/13/2012, 1:36 PM   Patient seen and examined.  Clinical course and management discussed.  Necessary edits performed.  I agree with the above.  Assessment and plan of care developed and discussed below.    Assessment: 37 y.o. female presenting with transient symptoms of facial droop, left face and leg numbness, along with headache.  Symptoms have now completely resolved.  MRI of the brain is unremarkable.  If ischemic in origin likely small vessel in origin.  Patient with poorly controlled diabetes.  On no antiplatelet therapy at home.    Stroke Risk Factors - diabetes mellitus and hypertension  Plan: 1. Prophylactic therapy-Antiplatelet med: Aspirin - dose 81mg  daily 2. Patient may be discharged to home.  To follow up with outpatient physician.    Thana Farr, MD [facility] [phone]  06/13/2012  4:05 PM

## 2012-06-13 NOTE — ED Notes (Signed)
Dr Ray at bedside. 

## 2012-06-13 NOTE — ED Notes (Addendum)
Per pt yesterday she started having pain over her eye, left sided facial numbness, slurred speech, and then this am started having left leg numbness and weakness. Pt grip strength weaker on the left and her speech is slightly changed due to left sided facial numbness. No facial droop noted. Pt A&O. BP elevated and triage

## 2012-06-13 NOTE — ED Provider Notes (Addendum)
History     CSN: [id number]  Arrival date & time 06/13/12  4098   First MD Initiated Contact with Patient 06/13/12 1133      Chief Complaint  Patient presents with  . Cerebrovascular Accident    (Consider location/radiation/quality/duration/timing/severity/associated sxs/prior treatment) Patient is a 37 y.o. female presenting with Acute Neurological Problem.  Cerebrovascular Accident   37 y.o. Female with sudden onset of severe left frontal headache at 1230 last night while watching tv followed by sensation of face melting on left.  Speech slurred this a.m. And left leg numb.  When she got out of the car here unable to walk and friend had to get wheelchair.  Patient states she had to be helped to bathroom after getting in room.  Similar facial symptoms in January lasted two days then resolved.  Headache last night 10/10 now 7/10.  No intervention.pmd [place].   Past Medical History  Diagnosis Date  . Hypertension   . Diabetes mellitus without complication     History reviewed. No pertinent past surgical history.  History reviewed. No pertinent family history.  History  Substance Use Topics  . Smoking status: Never Smoker   . Smokeless tobacco: Not on file  . Alcohol Use: No    OB History   Grav Para Term Preterm Abortions TAB SAB Ect Mult Living                  Review of Systems  Constitutional: Negative.   HENT: Negative.   Eyes: Negative.   Respiratory: Negative.   Cardiovascular: Negative.   Gastrointestinal: Negative.   Endocrine: Negative.   Genitourinary: Negative.   Musculoskeletal: Negative.   Skin: Negative.   Allergic/Immunologic: Negative.   Neurological: Negative.   Hematological: Negative.   Psychiatric/Behavioral: Negative.   All other systems reviewed and are negative.    Allergies  Review of patient's allergies indicates no known allergies.  Home Medications   Current Outpatient Rx  Name  Route  Sig  Dispense  Refill  . insulin  glargine (LANTUS) 100 UNIT/ML injection   Subcutaneous   Inject 75 Units into the skin at bedtime.         . insulin lispro (HUMALOG) 100 UNIT/ML injection   Subcutaneous   Inject 10-20 Units into the skin 5 (five) times daily. SSI. Patient has uncontrolled diabetes and may take SSI up to five times daily.         Marland Kitchen lisinopril-hydrochlorothiazide (PRINZIDE,ZESTORETIC) 20-12.5 MG per tablet   Oral   Take 1 tablet by mouth daily.         . metFORMIN (GLUCOPHAGE) 1000 MG tablet   Oral   Take 1,000 mg by mouth 2 (two) times daily.           BP 178/103  Pulse 82  Temp(Src) 98.9 F (37.2 C) (Oral)  Resp 18  SpO2 100%  LMP 06/02/2012  Physical Exam  Nursing note and vitals reviewed. Constitutional: She is oriented to person, place, and time. She appears well-developed and well-nourished.  HENT:  Head: Normocephalic and atraumatic.  Right Ear: External ear normal.  Left Ear: External ear normal.  Nose: Nose normal.  Mouth/Throat: Oropharynx is clear and moist.  Eyes: Conjunctivae and EOM are normal. Pupils are equal, round, and reactive to light. Right eye exhibits no discharge. Left eye exhibits no discharge. No scleral icterus.  Neck: Normal range of motion. Neck supple.  Cardiovascular: Normal rate, regular rhythm, normal heart sounds and intact distal pulses.  Pulmonary/Chest: Effort normal and breath sounds normal.  Abdominal: Soft. Bowel sounds are normal.  Musculoskeletal: Normal range of motion.  Neurological: She is alert and oriented to person, place, and time. She has normal strength and normal reflexes. A sensory deficit is present. She displays a negative Romberg sign. Coordination normal. GCS eye subscore is 4. GCS verbal subscore is 5. GCS motor subscore is 6.  Gait slow and halting but no abnormality noted    ED Course  Procedures (including critical care time)  Labs Reviewed  GLUCOSE, CAPILLARY - Abnormal; Notable for the following:     Glucose-Capillary 369 (*)    All other components within normal limits  POCT I-STAT, CHEM 8 - Abnormal; Notable for the following:    Glucose, Bld 381 (*)    Calcium, Ion 1.25 (*)    All other components within normal limits  CBC  DIFFERENTIAL  ETHANOL  PROTIME-INR  APTT  TROPONIN I  URINE RAPID DRUG SCREEN (HOSP PERFORMED)  URINALYSIS, ROUTINE W REFLEX MICROSCOPIC   Ct Head Wo Contrast  06/13/2012  *RADIOLOGY REPORT*  Clinical Data: Left sided pain and left leg numbness.  CT HEAD WITHOUT CONTRAST  Technique:  Contiguous axial images were obtained from the base of the skull through the vertex without contrast.  Comparison: MR brain 01/19/2009 and CT head 08/26/2006.  Findings: No evidence of acute infarct, acute hemorrhage, mass lesion, mass effect or hydrocephalus.  Visualized portions of the paranasal sinuses and mastoid air cells are clear.  IMPRESSION: Negative.   Original Report Authenticated By: Leanna Battles, M.D.      No diagnosis found.  lmp three weeks normal, s/p btl  Date: 06/13/2012  Rate: 82  Rhythm: normal sinus rhythm  QRS Axis: normal  Intervals: normal  ST/T Wave abnormalities: normal  Conduction Disutrbances: none  Narrative Interpretation: unremarkable        MDM    cva- head ct negative, sensation less on left side throughout.  Discussed with Dr. Thad Ranger, neurology, and will see and assess. Patient is not code stroke due to being outside window  Hyperglycemia- known iddm,  Iv ns and insulin ordered plan recheck.   3- hypertension.      Dr. Thad Ranger saw and evaluated the patient. She has a normal MRI per Dr. Thad Ranger. Her symptoms have resolved. Neurology advises discharging the patient to home to followup with her primary care Dr. Doreatha Lew be advised to take one aspirin per day  Hilario Quarry, MD 06/13/12 1558  Filed Vitals:   06/13/12 1415  BP: 148/85  Pulse: 72  Temp:   Resp:    Dr. Thad Ranger states the patient's headache has  resolved.  Discussed with patient.   Hilario Quarry, MD 06/13/12 [phone]

## 2012-06-13 NOTE — ED Notes (Signed)
Pt states she has been having stroke like sx. Pt c/o left sided facial numbness, " felt like the left side of my face was melting", and left sided weakness. No facial droop noted. Pt ambulated to restroom with minimum assistance.

## 2012-06-13 NOTE — ED Notes (Signed)
Discharge instructions reviewed. Pt verbalized understanding.  

## 2012-12-31 ENCOUNTER — Encounter (HOSPITAL_COMMUNITY): Payer: Self-pay | Admitting: Emergency Medicine

## 2012-12-31 ENCOUNTER — Emergency Department (INDEPENDENT_AMBULATORY_CARE_PROVIDER_SITE_OTHER)
Admission: EM | Admit: 2012-12-31 | Discharge: 2012-12-31 | Disposition: A | Discharge status: Home / Self Care | Payer: Self-pay | Source: Home / Self Care | Attending: Emergency Medicine | Admitting: Emergency Medicine

## 2012-12-31 DIAGNOSIS — E119 Type 2 diabetes mellitus without complications: Secondary | ICD-10-CM

## 2012-12-31 DIAGNOSIS — E785 Hyperlipidemia, unspecified: Secondary | ICD-10-CM

## 2012-12-31 DIAGNOSIS — I10 Essential (primary) hypertension: Secondary | ICD-10-CM

## 2012-12-31 LAB — POCT I-STAT, CHEM 8
Creatinine, Ser: 0.9 mg/dL (ref 0.50–1.10)
HCT: 43 % (ref 36.0–46.0)
Hemoglobin: 14.6 g/dL (ref 12.0–15.0)
Potassium: 4.4 mEq/L (ref 3.5–5.1)
Sodium: 140 mEq/L (ref 135–145)

## 2012-12-31 LAB — POCT URINALYSIS DIP (DEVICE)
Bilirubin Urine: NEGATIVE
Glucose, UA: 250 mg/dL — AB
Ketones, ur: NEGATIVE mg/dL
Leukocytes, UA: NEGATIVE

## 2012-12-31 LAB — LIPID PANEL
Total CHOL/HDL Ratio: 4.2 RATIO
VLDL: 15 mg/dL (ref 0–40)

## 2012-12-31 LAB — HEMOGLOBIN A1C: Mean Plasma Glucose: 263 mg/dL — ABNORMAL HIGH (ref <117)

## 2012-12-31 MED ORDER — METFORMIN HCL 1000 MG PO TABS: 1000.0000 mg | ORAL_TABLET | Freq: Two times a day (BID) | ORAL | Status: DC

## 2012-12-31 MED ORDER — INSULIN LISPRO 100 UNIT/ML ~~LOC~~ SOLN: SUBCUTANEOUS | Status: DC

## 2012-12-31 MED ORDER — INSULIN GLARGINE 100 UNIT/ML ~~LOC~~ SOLN: 75.0000 [IU] | Freq: Every day | SUBCUTANEOUS | Status: DC

## 2012-12-31 MED ORDER — LISINOPRIL-HYDROCHLOROTHIAZIDE 20-12.5 MG PO TABS: 1.0000 | ORAL_TABLET | Freq: Every day | ORAL | Status: DC

## 2012-12-31 NOTE — ED Notes (Signed)
Reports out of DM and BP meds. Last dose taken 4 days ago. Pt states that she doesn't have an appointment with the wellness center until dec 4 and was told to come here for refill.

## 2012-12-31 NOTE — ED Provider Notes (Signed)
Medical screening examination/treatment/procedure(s) were performed by non-physician practitioner and as supervising physician I was immediately available for consultation/collaboration.  Leslee Home, M.D.  Reuben Likes, MD 12/31/12 [phone]

## 2012-12-31 NOTE — ED Provider Notes (Signed)
CSN: [id number]     Arrival date & time 12/31/12  1023 History   First MD Initiated Contact with Patient 12/31/12 1220     Chief Complaint  Patient presents with  . Medication Refill    sent by adult care center. pt has appointment with them in Dec.    (Consider location/radiation/quality/duration/timing/severity/associated sxs/prior Treatment) HPI Comments: Pt without primary provider in this area. Made an appt at [facility] but earliest could get in is December so they referred her here.  Pt out of diabetes and bp meds for 4 days. Reports blood sugar has been in 250's without medicine. Also reports hx of hyperlipidemia but hasn't taken simvistatin for many months. Requests refills of lantus, humalog, lisinopril/htcz and metformin. Does report increased urination.   The history is provided by the patient.    Past Medical History  Diagnosis Date  . Hypertension   . Diabetes mellitus without complication    Past Surgical History  Procedure Laterality Date  . Cholecystectomy    . Tonsillectomy    . Tubal ligation    . Cyst excision     History reviewed. No pertinent family history. History  Substance Use Topics  . Smoking status: Never Smoker   . Smokeless tobacco: Not on file  . Alcohol Use: No   OB History   Grav Para Term Preterm Abortions TAB SAB Ect Mult Living                 Review of Systems  Respiratory: Negative for chest tightness and shortness of breath.   Cardiovascular: Negative for chest pain.  Endocrine: Positive for polydipsia. Negative for polyuria.    Allergies  Review of patient's allergies indicates no known allergies.  Home Medications   Current Outpatient Rx  Name  Route  Sig  Dispense  Refill  . insulin glargine (LANTUS) 100 UNIT/ML injection   Subcutaneous   Inject 75 Units into the skin at bedtime.         . insulin lispro (HUMALOG) 100 UNIT/ML injection   Subcutaneous   Inject 10-20 Units into the skin 5  (five) times daily. SSI. Patient has uncontrolled diabetes and may take SSI up to five times daily.         Marland Kitchen lisinopril-hydrochlorothiazide (PRINZIDE,ZESTORETIC) 20-12.5 MG per tablet   Oral   Take 1 tablet by mouth daily.         . metFORMIN (GLUCOPHAGE) 1000 MG tablet   Oral   Take 1,000 mg by mouth 2 (two) times daily.         . insulin glargine (LANTUS) 100 UNIT/ML injection   Subcutaneous   Inject 0.75 mLs (75 Units total) into the skin at bedtime.   30 mL   2   . insulin lispro (HUMALOG) 100 UNIT/ML injection      Check blood sugar TID AC. If <150: 0 units. If 150-189: 5 units sq. If 190-229: 10 units sq. If 230 or greater: 15 units sq   10 mL   2   . lisinopril-hydrochlorothiazide (PRINZIDE,ZESTORETIC) 20-12.5 MG per tablet   Oral   Take 1 tablet by mouth daily.   30 tablet   2   . metFORMIN (GLUCOPHAGE) 1000 MG tablet   Oral   Take 1 tablet (1,000 mg total) by mouth 2 (two) times daily with a meal.   60 tablet   2    BP 164/97  Pulse 74  Temp(Src) 98.5 F (36.9 C) (Oral)  Resp 20  SpO2 100%  LMP 12/16/2012 Physical Exam  Constitutional: She appears well-developed and well-nourished. No distress.  Cardiovascular: Normal rate and regular rhythm.   Pulmonary/Chest: Effort normal and breath sounds normal.    ED Course  Procedures (including critical care time) Labs Review Labs Reviewed  LIPID PANEL - Abnormal; Notable for the following:    Cholesterol 210 (*)    LDL Cholesterol 145 (*)    All other components within normal limits  POCT I-STAT, CHEM 8 - Abnormal; Notable for the following:    Glucose, Bld 157 (*)    Calcium, Ion 1.30 (*)    All other components within normal limits  POCT URINALYSIS DIP (DEVICE) - Abnormal; Notable for the following:    Glucose, UA 250 (*)    All other components within normal limits  HEMOGLOBIN A1C   Imaging Review No results found.  EKG Interpretation     Ventricular Rate:    PR Interval:    QRS  Duration:   QT Interval:    QTC Calculation:   R Axis:     Text Interpretation:              MDM   1. Diabetes mellitus   2. Hypertension   3. Hyperlipidemia   Hgb A1C, lipid panel for f/u with [facility], and, istat and ua for here. Rx humalog 100u/mL per patient's usual sliding scale (5u for bs 150-189, 10u for bs 190-229, and 15u for bs >/= 230; 30mL with 2 refill. Rx lantus 100u/mL, 75u qhs #17mL, 2 refill. Rx lisinopril/hctz 20/12.5 po qd #30, 2 refill. Rx metformin 1000mg  BID #60, 2 refill.      Cathlyn Parsons, NP 12/31/12 1406

## 2013-01-22 ENCOUNTER — Ambulatory Visit: Payer: Self-pay | Attending: Internal Medicine | Admitting: Internal Medicine

## 2013-01-22 ENCOUNTER — Encounter: Payer: Self-pay | Admitting: Internal Medicine

## 2013-01-22 VITALS — BP 156/91 | HR 109 | Temp 98.4°F | Resp 18 | Ht 64.0 in | Wt 215.0 lb

## 2013-01-22 DIAGNOSIS — Z0000 Encounter for general adult medical examination without abnormal findings: Secondary | ICD-10-CM

## 2013-01-22 DIAGNOSIS — I10 Essential (primary) hypertension: Secondary | ICD-10-CM | POA: Insufficient documentation

## 2013-01-22 DIAGNOSIS — E785 Hyperlipidemia, unspecified: Secondary | ICD-10-CM | POA: Insufficient documentation

## 2013-01-22 DIAGNOSIS — IMO0001 Reserved for inherently not codable concepts without codable children: Secondary | ICD-10-CM | POA: Insufficient documentation

## 2013-01-22 DIAGNOSIS — E1169 Type 2 diabetes mellitus with other specified complication: Secondary | ICD-10-CM | POA: Insufficient documentation

## 2013-01-22 MED ORDER — GLUCOSE BLOOD VI STRP: ORAL_STRIP | Status: DC

## 2013-01-22 MED ORDER — SIMVASTATIN 10 MG PO TABS: 10.0000 mg | ORAL_TABLET | Freq: Every day | ORAL | Status: DC

## 2013-01-22 MED ORDER — INSULIN NPH ISOPHANE & REGULAR (70-30) 100 UNIT/ML ~~LOC~~ SUSP: 30.0000 [IU] | Freq: Two times a day (BID) | SUBCUTANEOUS | Status: DC

## 2013-01-22 MED ORDER — ASPIRIN EC 81 MG PO TBEC: 81.0000 mg | DELAYED_RELEASE_TABLET | Freq: Every day | ORAL | Status: DC

## 2013-01-22 MED ORDER — GABAPENTIN 100 MG PO CAPS: 100.0000 mg | ORAL_CAPSULE | Freq: Three times a day (TID) | ORAL | Status: DC

## 2013-01-22 MED ORDER — ACCU-CHEK MULTICLIX LANCETS MISC: Status: DC

## 2013-01-22 MED ORDER — FREESTYLE SYSTEM KIT: 1.0000 | PACK | Status: DC | PRN

## 2013-01-22 NOTE — Progress Notes (Signed)
PT HERE TO ESTABLISH CARE DIABETES,HTN AND DEPRESSION MEDICATION REFILL LAST A1C-2 WEEKS AGO 10.8 NEUROPATHY LEFT WHOLE SIDE WITH WEAKNESS

## 2013-01-22 NOTE — Progress Notes (Signed)
Patient ID: Mariah Miller, female   DOB: Jul 16, 1975, 37 y.o.   MRN: [id number]   PCP:  No PCP Per Patient    Chief Complaint:  Establish care  HPI: 37 year old obese female with history of type 2 diabetes mellitus (last A1c of 10.8), with associated neuropathy, uncontrolled hypertension, ? depression here to establish care. She moved from [place] about 6 months back and was on Lantus and metformin. She had refills  until recently. She was seen in the urgent care recently and was given prescription for blood pressure medications and metformin. She reports being off of her medications for 1-2 weeks recently as she did not have any refills and also not afford Lantus. He reports having polyuria, nocturia ( gets up 2-3 times during the night. She also reports some headache, denies dizziness or blurry vision. She also reports tingling and numbness of her extremities and burning pain over her left legs. He was seen by neurology back in April for some left-sided weakness and numbness and recommended for starting low-dose aspirin for TIA like symptoms. She reports being adherent to her diet but has not been able to lose any weight. Does not exercise. Denies history of smoking or alcohol use. She denies any chest pain, shortness of breath, abdominal pain, nausea, vomiting, fever, chills, bowel symptoms. Denies any joint pains. Last Pap was several years ago.  Allergies: No Known Allergies  Prior to Admission medications   Medication Sig Start Date End Date Taking? Authorizing Provider  albuterol (PROVENTIL HFA;VENTOLIN HFA) 108 (90 BASE) MCG/ACT inhaler Inhale 2 puffs into the lungs every 6 (six) hours as needed for wheezing or shortness of breath.   Yes Historical Provider, MD  insulin glargine (LANTUS) 100 UNIT/ML injection Inject 0.75 mLs (75 Units total) into the skin at bedtime. 12/31/12  Yes Cathlyn Parsons, NP  insulin lispro (HUMALOG) 100 UNIT/ML injection Inject 10-20 Units into the skin 5  (five) times daily. SSI. Patient has uncontrolled diabetes and may take SSI up to five times daily.   Yes Historical Provider, MD  lisinopril-hydrochlorothiazide (PRINZIDE,ZESTORETIC) 20-12.5 MG per tablet Take 1 tablet by mouth daily. 12/31/12  Yes Cathlyn Parsons, NP  metFORMIN (GLUCOPHAGE) 1000 MG tablet Take 1 tablet (1,000 mg total) by mouth 2 (two) times daily with a meal. 12/31/12  Yes Cathlyn Parsons, NP  glucose blood test strip Use as instructed 01/22/13   Dorethia Jeanmarie, MD  glucose monitoring kit (FREESTYLE) monitoring kit 1 each by Does not apply route as needed for other. 01/22/13   Nyiah Pianka, MD  insulin lispro (HUMALOG) 100 UNIT/ML injection Check blood sugar TID AC. If <150: 0 units. If 150-189: 5 units sq. If 190-229: 10 units sq. If 230 or greater: 15 units sq 12/31/12   Cathlyn Parsons, NP  insulin NPH-regular (NOVOLIN 70/30) (70-30) 100 UNIT/ML injection Inject 30 Units into the skin 2 (two) times daily with a meal. 01/22/13   Litzy Dicker, MD  Lancets (ACCU-CHEK MULTICLIX) lancets Use as instructed 01/22/13   Carmen Vallecillo, MD  lisinopril-hydrochlorothiazide (PRINZIDE,ZESTORETIC) 20-12.5 MG per tablet Take 1 tablet by mouth daily.    Historical Provider, MD  metFORMIN (GLUCOPHAGE) 1000 MG tablet Take 1,000 mg by mouth 2 (two) times daily.    Historical Provider, MD  simvastatin (ZOCOR) 10 MG tablet Take 1 tablet (10 mg total) by mouth at bedtime. 01/22/13   Eddie North, MD    Past Medical History  Diagnosis Date  . Hypertension   . Diabetes mellitus  without complication     Past Surgical History  Procedure Laterality Date  . Cholecystectomy    . Tonsillectomy    . Tubal ligation    . Cyst excision      Social History:  reports that she has never smoked. She does not have any smokeless tobacco history on file. She reports that she does not drink alcohol or use illicit drugs.  History reviewed. No pertinent family history.  Review of Systems:  As  outlined in history of present illness   Physical Exam:  Filed Vitals:   01/22/13 1655  BP: 156/91  Pulse: 109  Temp: 98.4 F (36.9 C)  TempSrc: Oral  Resp: 18  Height: 5\' 4"  (1.626 m)  Weight: 215 lb (97.523 kg)  SpO2: 99%    Constitutional: Vital signs reviewed.  Patient is a well-developed obese female in no acute distress and cooperative with exam.  HEENT: No pallor, moist oral mucosa, no icterus, no cervical lymphadenopathy  Cardiovascular: RRR, S1 normal, S2 normal, no MRG,  Pulmonary/Chest: CTAB, no wheezes, rales, or rhonchi Abdominal: Soft. Non-tender, non-distended, bowel sounds are normal, Musculoskeletal: No joint deformities, erythema, or stiffness, ROM full and no nontender Ext: no edema  Neurological: A&O x3, nonfocal   Labs on Admission:  No results found for this or any previous visit (from the past 48 hour(s)).  Radiological Exams on Admission: No results found.  Assessment/Plan Uncontrolled diabetes mellitus A1c of 10.8. Patient unable to afford Lantus at this time. I would place her on NovoLog 70-30,  30 units bid . I will also prescribe a glucometer and have asked her to keep a log of her blood glucose twice daily and bring it in the next visit Patient is on metformin which she should continue Will add neurontin for neuropathy symptoms  Uncontrolled hypertension Patient was also blood pressure medications for several days prior to recent to urgent care. She should continue current blood pressure medications and followup with [place] in 3 weeks. Her blood pressure is still elevated we will adjust her  blood pressure medication.  hyperlipidemia I will add a note was Zocor  Obesity Counseled on dietary adherence and exercise to lose weight  Health maintenance refer to GYN for Pap smear  Follow up in 3 weeks   Keanen Dohse 01/22/2013, 6:10 PM

## 2013-01-25 ENCOUNTER — Other Ambulatory Visit: Payer: Self-pay | Admitting: Emergency Medicine

## 2013-01-25 MED ORDER — INSULIN SYRINGES (DISPOSABLE) U-100 0.3 ML MISC: Status: DC

## 2013-02-07 ENCOUNTER — Ambulatory Visit: Payer: Self-pay

## 2013-02-25 ENCOUNTER — Ambulatory Visit: Payer: Self-pay | Admitting: Internal Medicine

## 2013-03-18 ENCOUNTER — Ambulatory Visit: Payer: Self-pay | Attending: Internal Medicine

## 2013-03-21 ENCOUNTER — Ambulatory Visit: Payer: No Typology Code available for payment source | Attending: Internal Medicine | Admitting: Internal Medicine

## 2013-03-21 ENCOUNTER — Encounter: Payer: Self-pay | Admitting: Internal Medicine

## 2013-03-21 VITALS — BP 164/88 | HR 100 | Temp 98.0°F | Resp 16 | Ht 64.0 in | Wt 214.0 lb

## 2013-03-21 DIAGNOSIS — E1149 Type 2 diabetes mellitus with other diabetic neurological complication: Secondary | ICD-10-CM | POA: Insufficient documentation

## 2013-03-21 DIAGNOSIS — I10 Essential (primary) hypertension: Secondary | ICD-10-CM

## 2013-03-21 DIAGNOSIS — E1310 Other specified diabetes mellitus with ketoacidosis without coma: Secondary | ICD-10-CM

## 2013-03-21 DIAGNOSIS — E1142 Type 2 diabetes mellitus with diabetic polyneuropathy: Secondary | ICD-10-CM | POA: Insufficient documentation

## 2013-03-21 DIAGNOSIS — E1140 Type 2 diabetes mellitus with diabetic neuropathy, unspecified: Secondary | ICD-10-CM

## 2013-03-21 DIAGNOSIS — E1110 Type 2 diabetes mellitus with ketoacidosis without coma: Secondary | ICD-10-CM | POA: Insufficient documentation

## 2013-03-21 LAB — GLUCOSE, POCT (MANUAL RESULT ENTRY): POC GLUCOSE: 251 mg/dL — AB (ref 70–99)

## 2013-03-21 LAB — POCT GLYCOSYLATED HEMOGLOBIN (HGB A1C): HEMOGLOBIN A1C: 9.1

## 2013-03-21 MED ORDER — INSULIN NPH ISOPHANE & REGULAR (70-30) 100 UNIT/ML ~~LOC~~ SUSP: 40.0000 [IU] | Freq: Two times a day (BID) | SUBCUTANEOUS | Status: DC

## 2013-03-21 MED ORDER — GABAPENTIN 300 MG PO CAPS: 300.0000 mg | ORAL_CAPSULE | Freq: Three times a day (TID) | ORAL | Status: DC

## 2013-03-21 NOTE — Patient Instructions (Signed)
Diabetes Meal Planning Guide The diabetes meal planning guide is a tool to help you plan your meals and snacks. It is important for people with diabetes to manage their blood glucose (sugar) levels. Choosing the right foods and the right amounts throughout your day will help control your blood glucose. Eating right can even help you improve your blood pressure and reach or maintain a healthy weight. CARBOHYDRATE COUNTING MADE EASY When you eat carbohydrates, they turn to sugar. This raises your blood glucose level. Counting carbohydrates can help you control this level so you feel better. When you plan your meals by counting carbohydrates, you can have more flexibility in what you eat and balance your medicine with your food intake. Carbohydrate counting simply means adding up the total amount of carbohydrate grams in your meals and snacks. Try to eat about the same amount at each meal. Foods with carbohydrates are listed below. Each portion below is 1 carbohydrate serving or 15 grams of carbohydrates. Ask your dietician how many grams of carbohydrates you should eat at each meal or snack. Grains and Starches  1 slice bread.   English muffin or hotdog/hamburger bun.   cup cold cereal (unsweetened).   cup cooked pasta or rice.   cup starchy vegetables (corn, potatoes, peas, beans, winter squash).  1 tortilla (6 inches).   bagel.  1 waffle or pancake (size of a CD).   cup cooked cereal.  4 to 6 small crackers. *Whole grain is recommended. Fruit  1 cup fresh unsweetened berries, melon, papaya, pineapple.  1 small fresh fruit.   banana or mango.   cup fruit juice (4 oz unsweetened).   cup canned fruit in natural juice or water.  2 tbs dried fruit.  12 to 15 grapes or cherries. Milk and Yogurt  1 cup fat-free or 1% milk.  1 cup soy milk.  6 oz light yogurt with sugar-free sweetener.  6 oz low-fat soy yogurt.  6 oz plain yogurt. Vegetables  1 cup raw or  cup  cooked is counted as 0 carbohydrates or a "free" food.  If you eat 3 or more servings at 1 meal, count them as 1 carbohydrate serving. Other Carbohydrates   oz chips or pretzels.   cup ice cream or frozen yogurt.   cup sherbet or sorbet.  2 inch square cake, no frosting.  1 tbs honey, sugar, jam, jelly, or syrup.  2 small cookies.  3 squares of graham crackers.  3 cups popcorn.  6 crackers.  1 cup broth-based soup.  Count 1 cup casserole or other mixed foods as 2 carbohydrate servings.  Foods with less than 20 calories in a serving may be counted as 0 carbohydrates or a "free" food. You may want to purchase a book or computer software that lists the carbohydrate gram counts of different foods. In addition, the nutrition facts panel on the labels of the foods you eat are a good source of this information. The label will tell you how big the serving size is and the total number of carbohydrate grams you will be eating per serving. Divide this number by 15 to obtain the number of carbohydrate servings in a portion. Remember, 1 carbohydrate serving equals 15 grams of carbohydrate. SERVING SIZES Measuring foods and serving sizes helps you make sure you are getting the right amount of food. The list below tells how big or small some common serving sizes are.  1 oz.........4 stacked dice.  3 oz.........Deck of cards.  1 tsp........Tip   of little finger.  1 tbs........Thumb.  2 tbs........Golf ball.   cup.......Half of a fist.  1 cup........A fist. SAMPLE DIABETES MEAL PLAN Below is a sample meal plan that includes foods from the grain and starches, dairy, vegetable, fruit, and meat groups. A dietician can individualize a meal plan to fit your calorie needs and tell you the number of servings needed from each food group. However, controlling the total amount of carbohydrates in your meal or snack is more important than making sure you include all of the food groups at every  meal. You may interchange carbohydrate containing foods (dairy, starches, and fruits). The meal plan below is an example of a 2000 calorie diet using carbohydrate counting. This meal plan has 17 carbohydrate servings. Breakfast  1 cup oatmeal (2 carb servings).   cup light yogurt (1 carb serving).  1 cup blueberries (1 carb serving).   cup almonds. Snack  1 large apple (2 carb servings).  1 low-fat string cheese stick. Lunch  Chicken breast salad.  1 cup spinach.   cup chopped tomatoes.  2 oz chicken breast, sliced.  2 tbs low-fat Italian dressing.  12 whole-wheat crackers (2 carb servings).  12 to 15 grapes (1 carb serving).  1 cup low-fat milk (1 carb serving). Snack  1 cup carrots.   cup hummus (1 carb serving). Dinner  3 oz broiled salmon.  1 cup brown rice (3 carb servings). Snack  1  cups steamed broccoli (1 carb serving) drizzled with 1 tsp olive oil and lemon juice.  1 cup light pudding (2 carb servings). DIABETES MEAL PLANNING WORKSHEET Your dietician can use this worksheet to help you decide how many servings of foods and what types of foods are right for you.  BREAKFAST Food Group and Servings / Carb Servings Grain/Starches __________________________________ Dairy __________________________________________ Vegetable ______________________________________ Fruit ___________________________________________ Meat __________________________________________ Fat ____________________________________________ LUNCH Food Group and Servings / Carb Servings Grain/Starches ___________________________________ Dairy ___________________________________________ Fruit ____________________________________________ Meat ___________________________________________ Fat _____________________________________________ DINNER Food Group and Servings / Carb Servings Grain/Starches ___________________________________ Dairy  ___________________________________________ Fruit ____________________________________________ Meat ___________________________________________ Fat _____________________________________________ SNACKS Food Group and Servings / Carb Servings Grain/Starches ___________________________________ Dairy ___________________________________________ Vegetable _______________________________________ Fruit ____________________________________________ Meat ___________________________________________ Fat _____________________________________________ DAILY TOTALS Starches _________________________ Vegetable ________________________ Fruit ____________________________ Dairy ____________________________ Meat ____________________________ Fat ______________________________ Document Released: 11/18/2004 Document Revised: 05/16/2011 Document Reviewed: 09/29/2008 Exit[employer] Patient Information 2014 [employer]. DASH Diet The DASH diet stands for "Dietary Approaches to Stop Hypertension." It is a healthy eating plan that has been shown to reduce high blood pressure (hypertension) in as little as 14 days, while also possibly providing other significant health benefits. These other health benefits include reducing the risk of breast cancer after menopause and reducing the risk of type 2 diabetes, heart disease, colon cancer, and stroke. Health benefits also include weight loss and slowing kidney failure in patients with chronic kidney disease.  DIET GUIDELINES  Limit salt (sodium). Your diet should contain less than 1500 mg of sodium daily.  Limit refined or processed carbohydrates. Your diet should include mostly whole grains. Desserts and added sugars should be used sparingly.  Include small amounts of heart-healthy fats. These types of fats include nuts, oils, and tub margarine. Limit saturated and trans fats. These fats have been shown to be harmful in the body. CHOOSING FOODS  The following food groups  are based on a 2000 calorie diet. See your Registered Dietitian for individual calorie needs. Grains and Grain Products (6 to 8 servings daily)  Eat More Often:   Whole-wheat bread, brown rice, whole-grain or wheat pasta, quinoa, popcorn without added fat or salt (air popped).  Eat Less Often: White bread, white pasta, white rice, cornbread. Vegetables (4 to 5 servings daily)  Eat More Often: Fresh, frozen, and canned vegetables. Vegetables may be raw, steamed, roasted, or grilled with a minimal amount of fat.  Eat Less Often/Avoid: Creamed or fried vegetables. Vegetables in a cheese sauce. Fruit (4 to 5 servings daily)  Eat More Often: All fresh, canned (in natural juice), or frozen fruits. Dried fruits without added sugar. One hundred percent fruit juice ( cup [237 mL] daily).  Eat Less Often: Dried fruits with added sugar. Canned fruit in light or heavy syrup. Lean Meats, Fish, and Poultry (2 servings or less daily. One serving is 3 to 4 oz [85-114 g]).  Eat More Often: Ninety percent or leaner ground beef, tenderloin, sirloin. Round cuts of beef, chicken breast, turkey breast. All fish. Grill, bake, or broil your meat. Nothing should be fried.  Eat Less Often/Avoid: Fatty cuts of meat, turkey, or chicken leg, thigh, or wing. Fried cuts of meat or fish. Dairy (2 to 3 servings)  Eat More Often: Low-fat or fat-free milk, low-fat plain or light yogurt, reduced-fat or part-skim cheese.  Eat Less Often/Avoid: Milk (whole, 2%).Whole milk yogurt. Full-fat cheeses. Nuts, Seeds, and Legumes (4 to 5 servings per week)  Eat More Often: All without added salt.  Eat Less Often/Avoid: Salted nuts and seeds, canned beans with added salt. Fats and Sweets (limited)  Eat More Often: Vegetable oils, tub margarines without trans fats, sugar-free gelatin. Mayonnaise and salad dressings.  Eat Less Often/Avoid: Coconut oils, palm oils, butter, stick margarine, cream, half and half, cookies, candy,  pie. FOR MORE INFORMATION The Dash Diet Eating Plan: www.dashdiet.org Document Released: 02/10/2011 Document Revised: 05/16/2011 Document Reviewed: 02/10/2011 ExitCare Patient Information 2014 [employer].  

## 2013-03-21 NOTE — Progress Notes (Signed)
Patient ID: Mariah Miller, female   DOB: [birth year], 38 y.o.   MRN: [id number] Patient Demographics  Mariah Miller, is a 38 y.o. female  [id number]  [id number]  DOB - [birth year]  Chief Complaint  Patient presents with  . Follow-up  . Hypertension  . Diabetes        Subjective:   Mariah Miller is a 38 y.o. female here today for a follow up visit. Patient is known to have hypertension and diabetes. Blood sugar has been uncontrolled ranging between 200 and 300. She claims compliant with medications and she has been trying very hard on nutritional control and exercise, she is almost at the point of frustration because of uncontrolled blood sugar despite all efforts. She used to be on Lantus insulin 75 units at night but when she lost her insurance she no longer could afford Lantus. She is now on NovoLog 7030 at 30 units twice a day and metformin 1000 mg tablet by mouth twice a day. Had neuropathic pain persists despite 100 mg tablets of gabapentin 3 times a day. Her blood pressure is controlled at home on medications but now she is upset about her blood sugar and her blood pressure is high. Patient has no symptoms today, has not had eye examination in more than a year, had diabetic education in [place] long time ago when she was first diagnosed. She does not smoke cigarette she does not drink alcohol. Patient has No headache, No chest pain, No abdominal pain - No Nausea, No new weakness tingling or numbness, No Cough - SOB.  ALLERGIES: No Known Allergies  PAST MEDICAL HISTORY: Past Medical History  Diagnosis Date  . Hypertension   . Diabetes mellitus without complication     MEDICATIONS AT HOME: Prior to Admission medications   Medication Sig Start Date End Date Taking? Authorizing Provider  albuterol (PROVENTIL HFA;VENTOLIN HFA) 108 (90 BASE) MCG/ACT inhaler Inhale 2 puffs into the lungs every 6 (six) hours as needed for wheezing or shortness of breath.   Yes  Historical Provider, MD  aspirin EC 81 MG tablet Take 1 tablet (81 mg total) by mouth daily. 01/22/13  Yes Nishant Dhungel, MD  glucose blood test strip Use as instructed 01/22/13  Yes Nishant Dhungel, MD  insulin NPH-regular Human (NOVOLIN 70/30) (70-30) 100 UNIT/ML injection Inject 40 Units into the skin 2 (two) times daily with a meal. 03/21/13  Yes Angelica Chessman, MD  metFORMIN (GLUCOPHAGE) 1000 MG tablet Take 1 tablet (1,000 mg total) by mouth 2 (two) times daily with a meal. 12/31/12  Yes Carvel Getting, NP  simvastatin (ZOCOR) 10 MG tablet Take 1 tablet (10 mg total) by mouth at bedtime. 01/22/13  Yes Nishant Dhungel, MD  gabapentin (NEURONTIN) 300 MG capsule Take 1 capsule (300 mg total) by mouth 3 (three) times daily. 03/21/13   Angelica Chessman, MD  glucose monitoring kit (FREESTYLE) monitoring kit 1 each by Does not apply route as needed for other. 01/22/13   Nishant Dhungel, MD  insulin glargine (LANTUS) 100 UNIT/ML injection Inject 0.75 mLs (75 Units total) into the skin at bedtime. 12/31/12   Carvel Getting, NP  Insulin Syringes, Disposable, U-100 0.3 ML MISC Use to inject as directed by the physician 01/25/13   Angelica Chessman, MD  Lancets (ACCU-CHEK MULTICLIX) lancets Use as instructed 01/22/13   Nishant Dhungel, MD  lisinopril-hydrochlorothiazide (PRINZIDE,ZESTORETIC) 20-12.5 MG per tablet Take 1 tablet by mouth daily.    Historical Provider, MD     Objective:  Filed Vitals:   03/21/13 1728  BP: 164/88  Pulse: 100  Temp: 98 F (36.7 C)  TempSrc: Oral  Resp: 16  Height: $Remove'5\' 4"'XyaoDtX$  (1.626 m)  Weight: 214 lb (97.07 kg)  SpO2: 100%    Exam General appearance : Awake, alert, not in any distress. Speech Clear. Not toxic looking, obese HEENT: Atraumatic and Normocephalic, pupils equally reactive to light and accomodation Neck: supple, no JVD. No cervical lymphadenopathy.  Chest:Good air entry bilaterally, no added sounds  CVS: S1 S2 regular, no murmurs.  Abdomen: Bowel  sounds present, Non tender and not distended with no gaurding, rigidity or rebound. Extremities: B/L Lower Ext shows no edema, both legs are warm to touch Neurology: Awake alert, and oriented X 3, CN II-XII intact, Non focal Skin:No Rash Wounds:N/A   Data Review   CBC No results found for this basename: WBC, HGB, HCT, PLT, MCV, MCH, MCHC, RDW, NEUTRABS, LYMPHSABS, MONOABS, EOSABS, BASOSABS, BANDABS, BANDSABD,  in the last 168 hours  Chemistries   No results found for this basename: NA, K, CL, CO2, GLUCOSE, BUN, CREATININE, GFRCGP, CALCIUM, MG, AST, ALT, ALKPHOS, BILITOT,  in the last 168 hours ------------------------------------------------------------------------------------------------------------------ No results found for this basename: HGBA1C,  in the last 72 hours ------------------------------------------------------------------------------------------------------------------ No results found for this basename: CHOL, HDL, LDLCALC, TRIG, CHOLHDL, LDLDIRECT,  in the last 72 hours ------------------------------------------------------------------------------------------------------------------ No results found for this basename: TSH, T4TOTAL, FREET3, T3FREE, THYROIDAB,  in the last 72 hours ------------------------------------------------------------------------------------------------------------------ No results found for this basename: VITAMINB12, FOLATE, FERRITIN, TIBC, IRON, RETICCTPCT,  in the last 72 hours  Coagulation profile  No results found for this basename: INR, PROTIME,  in the last 168 hours    Assessment & Plan   1. DM (diabetes mellitus) type 2, uncontrolled - HgB A1c - Glucose (CBG) Will increase insulin dose to 40 units twice a day - insulin NPH-regular Human (NOVOLIN 70/30) (70-30) 100 UNIT/ML injection; Inject 40 Units into the skin 2 (two) times daily with a meal.  Dispense: 30 mL; Refill: 12 - Ambulatory referral to Ophthalmology - Ambulatory referral  to diabetic education  2. Essential hypertension, benign Patient has been counseled extensively about exercise and nutrition DASH diet Continue present dose of medication and keep a blood pressure log, may need to go up on the dose next visit if blood pressure remains high  3. Diabetic neuropathy, painful We will increase gabapentin dose - gabapentin (NEURONTIN) 300 MG capsule; Take 1 capsule (300 mg total) by mouth 3 (three) times daily.  Dispense: 60 capsule; Refill: 2   Follow up in 2 weeks for blood sugar log check in 3 months for regular followup   The patient was given clear instructions to go to ER or return to medical center if symptoms don't improve, worsen or new problems develop. The patient verbalized understanding. The patient was told to call to get lab results if they haven't heard anything in the next week.    Angelica Chessman, MD, [place], [place], [place] and [place], [place]   03/21/2013, 5:57 PM

## 2013-03-21 NOTE — Progress Notes (Signed)
Pt here f/u Diabetes,HTN  C/o nerve pain. Need gabapentin increased Nosebleeds x 1 week Left side flank pain radiating to left back BP-164/88 Uncontrolled blood sugars

## 2013-04-08 ENCOUNTER — Emergency Department (HOSPITAL_COMMUNITY): Payer: MEDICAID

## 2013-04-08 ENCOUNTER — Inpatient Hospital Stay (HOSPITAL_COMMUNITY)
Admission: EM | Admit: 2013-04-08 | Discharge: 2013-04-10 | DRG: 639 | Disposition: A | Discharge status: Home / Self Care | Payer: MEDICAID | Attending: Internal Medicine | Admitting: Internal Medicine

## 2013-04-08 ENCOUNTER — Encounter (HOSPITAL_COMMUNITY): Payer: Self-pay | Admitting: Emergency Medicine

## 2013-04-08 DIAGNOSIS — E1140 Type 2 diabetes mellitus with diabetic neuropathy, unspecified: Secondary | ICD-10-CM

## 2013-04-08 DIAGNOSIS — R079 Chest pain, unspecified: Secondary | ICD-10-CM | POA: Diagnosis present

## 2013-04-08 DIAGNOSIS — Z79899 Other long term (current) drug therapy: Secondary | ICD-10-CM

## 2013-04-08 DIAGNOSIS — IMO0001 Reserved for inherently not codable concepts without codable children: Secondary | ICD-10-CM

## 2013-04-08 DIAGNOSIS — E1142 Type 2 diabetes mellitus with diabetic polyneuropathy: Secondary | ICD-10-CM | POA: Diagnosis present

## 2013-04-08 DIAGNOSIS — E1149 Type 2 diabetes mellitus with other diabetic neurological complication: Secondary | ICD-10-CM | POA: Diagnosis present

## 2013-04-08 DIAGNOSIS — E1169 Type 2 diabetes mellitus with other specified complication: Principal | ICD-10-CM

## 2013-04-08 DIAGNOSIS — E1310 Other specified diabetes mellitus with ketoacidosis without coma: Secondary | ICD-10-CM

## 2013-04-08 DIAGNOSIS — Z794 Long term (current) use of insulin: Secondary | ICD-10-CM

## 2013-04-08 DIAGNOSIS — IMO0002 Reserved for concepts with insufficient information to code with codable children: Principal | ICD-10-CM | POA: Diagnosis present

## 2013-04-08 DIAGNOSIS — E1165 Type 2 diabetes mellitus with hyperglycemia: Principal | ICD-10-CM | POA: Diagnosis present

## 2013-04-08 DIAGNOSIS — Z7982 Long term (current) use of aspirin: Secondary | ICD-10-CM

## 2013-04-08 DIAGNOSIS — R6883 Chills (without fever): Secondary | ICD-10-CM | POA: Diagnosis present

## 2013-04-08 DIAGNOSIS — I10 Essential (primary) hypertension: Secondary | ICD-10-CM | POA: Diagnosis present

## 2013-04-08 DIAGNOSIS — E1110 Type 2 diabetes mellitus with ketoacidosis without coma: Secondary | ICD-10-CM

## 2013-04-08 DIAGNOSIS — E162 Hypoglycemia, unspecified: Secondary | ICD-10-CM | POA: Diagnosis present

## 2013-04-08 LAB — URINALYSIS, ROUTINE W REFLEX MICROSCOPIC
Bilirubin Urine: NEGATIVE
Bilirubin Urine: NEGATIVE
GLUCOSE, UA: NEGATIVE mg/dL
Glucose, UA: NEGATIVE mg/dL
Hgb urine dipstick: NEGATIVE
Hgb urine dipstick: NEGATIVE
Ketones, ur: 15 mg/dL — AB
Ketones, ur: 15 mg/dL — AB
LEUKOCYTES UA: NEGATIVE
Leukocytes, UA: NEGATIVE
NITRITE: NEGATIVE
Nitrite: NEGATIVE
PH: 8 (ref 5.0–8.0)
PROTEIN: 30 mg/dL — AB
Protein, ur: 30 mg/dL — AB
Specific Gravity, Urine: 1.03 (ref 1.005–1.030)
Specific Gravity, Urine: 1.03 (ref 1.005–1.030)
Urobilinogen, UA: 1 mg/dL (ref 0.0–1.0)
Urobilinogen, UA: 1 mg/dL (ref 0.0–1.0)
pH: 8.5 — ABNORMAL HIGH (ref 5.0–8.0)

## 2013-04-08 LAB — PREGNANCY, URINE: Preg Test, Ur: NEGATIVE

## 2013-04-08 LAB — CBC WITH DIFFERENTIAL/PLATELET
Basophils Absolute: 0 10*3/uL (ref 0.0–0.1)
Basophils Relative: 0 % (ref 0–1)
EOS ABS: 0.1 10*3/uL (ref 0.0–0.7)
Eosinophils Relative: 1 % (ref 0–5)
HEMATOCRIT: 35 % — AB (ref 36.0–46.0)
HEMOGLOBIN: 11.9 g/dL — AB (ref 12.0–15.0)
Lymphocytes Relative: 35 % (ref 12–46)
Lymphs Abs: 2.8 10*3/uL (ref 0.7–4.0)
MCH: 30.7 pg (ref 26.0–34.0)
MCHC: 34 g/dL (ref 30.0–36.0)
MCV: 90.2 fL (ref 78.0–100.0)
MONO ABS: 0.9 10*3/uL (ref 0.1–1.0)
MONOS PCT: 12 % (ref 3–12)
Neutro Abs: 4.2 10*3/uL (ref 1.7–7.7)
Neutrophils Relative %: 52 % (ref 43–77)
Platelets: 373 10*3/uL (ref 150–400)
RBC: 3.88 MIL/uL (ref 3.87–5.11)
RDW: 13.2 % (ref 11.5–15.5)
WBC: 8 10*3/uL (ref 4.0–10.5)

## 2013-04-08 LAB — COMPREHENSIVE METABOLIC PANEL
ALBUMIN: 3.4 g/dL — AB (ref 3.5–5.2)
ALT: 13 U/L (ref 0–35)
AST: 17 U/L (ref 0–37)
Alkaline Phosphatase: 56 U/L (ref 39–117)
BUN: 7 mg/dL (ref 6–23)
CO2: 26 mEq/L (ref 19–32)
CREATININE: 0.83 mg/dL (ref 0.50–1.10)
Calcium: 9.2 mg/dL (ref 8.4–10.5)
Chloride: 106 mEq/L (ref 96–112)
GFR calc Af Amer: >90 mL/min (ref >90)
GFR calc non Af Amer: 90 mL/min — ABNORMAL LOW (ref >90)
Glucose, Bld: 34 mg/dL — CL (ref 70–99)
Potassium: 4 mEq/L (ref 3.7–5.3)
Sodium: 145 mEq/L (ref 137–147)
TOTAL PROTEIN: 7.5 g/dL (ref 6.0–8.3)
Total Bilirubin: <0.2 mg/dL — ABNORMAL LOW (ref 0.3–1.2)

## 2013-04-08 LAB — GLUCOSE, CAPILLARY
GLUCOSE-CAPILLARY: 115 mg/dL — AB (ref 70–99)
GLUCOSE-CAPILLARY: 87 mg/dL (ref 70–99)
Glucose-Capillary: 47 mg/dL — ABNORMAL LOW (ref 70–99)
Glucose-Capillary: 81 mg/dL (ref 70–99)

## 2013-04-08 LAB — BASIC METABOLIC PANEL
BUN: 8 mg/dL (ref 6–23)
CALCIUM: 8.8 mg/dL (ref 8.4–10.5)
CO2: 24 mEq/L (ref 19–32)
Chloride: 105 mEq/L (ref 96–112)
Creatinine, Ser: 0.82 mg/dL (ref 0.50–1.10)
GLUCOSE: 86 mg/dL (ref 70–99)
Potassium: 3.9 mEq/L (ref 3.7–5.3)
Sodium: 141 mEq/L (ref 137–147)

## 2013-04-08 LAB — RAPID URINE DRUG SCREEN, HOSP PERFORMED
Amphetamines: NOT DETECTED
Barbiturates: NOT DETECTED
Benzodiazepines: NOT DETECTED
Cocaine: NOT DETECTED
Opiates: NOT DETECTED
Tetrahydrocannabinol: NOT DETECTED

## 2013-04-08 LAB — URINE MICROSCOPIC-ADD ON

## 2013-04-08 LAB — POCT I-STAT TROPONIN I: Troponin i, poc: 0 ng/mL (ref 0.00–0.08)

## 2013-04-08 LAB — TROPONIN I: Troponin I: <0.3 ng/mL (ref <0.30)

## 2013-04-08 MED ORDER — GLUCAGON HCL (RDNA) 1 MG IJ SOLR
1.0000 mg | Freq: Once | INTRAMUSCULAR | Status: AC
  Administered 2013-04-08: 1 mg via INTRAMUSCULAR
  Filled 2013-04-08: qty 1

## 2013-04-08 MED ORDER — HYDROCHLOROTHIAZIDE 12.5 MG PO CAPS
12.5000 mg | ORAL_CAPSULE | Freq: Every day | ORAL | Status: DC
  Administered 2013-04-09 – 2013-04-10 (×2): 12.5 mg via ORAL
  Filled 2013-04-08 (×2): qty 1

## 2013-04-08 MED ORDER — ONDANSETRON HCL 4 MG PO TABS: 4.0000 mg | ORAL_TABLET | Freq: Four times a day (QID) | ORAL | Status: DC | PRN

## 2013-04-08 MED ORDER — HYDROCODONE-ACETAMINOPHEN 5-325 MG PO TABS: 1.0000 | ORAL_TABLET | ORAL | Status: DC | PRN

## 2013-04-08 MED ORDER — LISINOPRIL 20 MG PO TABS
20.0000 mg | ORAL_TABLET | Freq: Every day | ORAL | Status: DC
  Administered 2013-04-09 – 2013-04-10 (×2): 20 mg via ORAL
  Filled 2013-04-08 (×2): qty 1

## 2013-04-08 MED ORDER — ONDANSETRON HCL 4 MG/2ML IJ SOLN: 4.0000 mg | Freq: Four times a day (QID) | INTRAMUSCULAR | Status: DC | PRN

## 2013-04-08 MED ORDER — DEXTROSE-NACL 5-0.45 % IV SOLN
INTRAVENOUS | Status: DC
  Administered 2013-04-08: 1000 mL via INTRAVENOUS
  Administered 2013-04-09: 07:00:00 via INTRAVENOUS

## 2013-04-08 MED ORDER — ASPIRIN EC 81 MG PO TBEC
81.0000 mg | DELAYED_RELEASE_TABLET | Freq: Every day | ORAL | Status: DC
  Administered 2013-04-08 – 2013-04-10 (×3): 81 mg via ORAL
  Filled 2013-04-08 (×3): qty 1

## 2013-04-08 MED ORDER — ENOXAPARIN SODIUM 40 MG/0.4ML ~~LOC~~ SOLN
40.0000 mg | SUBCUTANEOUS | Status: DC
  Administered 2013-04-08 – 2013-04-09 (×2): 40 mg via SUBCUTANEOUS
  Filled 2013-04-08 (×3): qty 0.4

## 2013-04-08 MED ORDER — SIMVASTATIN 10 MG PO TABS
10.0000 mg | ORAL_TABLET | Freq: Every day | ORAL | Status: DC
  Administered 2013-04-08 – 2013-04-09 (×2): 10 mg via ORAL
  Filled 2013-04-08 (×3): qty 1

## 2013-04-08 MED ORDER — LISINOPRIL-HYDROCHLOROTHIAZIDE 20-12.5 MG PO TABS: 1.0000 | ORAL_TABLET | Freq: Every day | ORAL | Status: DC

## 2013-04-08 MED ORDER — GABAPENTIN 300 MG PO CAPS
300.0000 mg | ORAL_CAPSULE | Freq: Three times a day (TID) | ORAL | Status: DC
  Administered 2013-04-08 – 2013-04-10 (×5): 300 mg via ORAL
  Filled 2013-04-08 (×7): qty 1

## 2013-04-08 NOTE — ED Notes (Signed)
Pt  States she feels lightheaded and nauseated, CBG 87 Dr. Silverio LayYao informed.

## 2013-04-08 NOTE — Progress Notes (Signed)
Pt admitted to the unit. Pt is alert and oriented. Pt oriented to room, staff, and call bell. Bed in lowest position. Full assessment to Epic. Call bell with in reach. Told to call for assists. Will continue to monitor.  Mariah Miller E  

## 2013-04-08 NOTE — ED Notes (Signed)
Patient transported to X-ray 

## 2013-04-08 NOTE — ED Notes (Signed)
Per EMS pt had a CBG of 40 on first arrival with CP and cold sweats . Pt reported CP radiated down Lt arm. Pt given 2 NGT with initial CP 7/10 ,pain decreased to 2/10 . Pt given D10 IV and CBG increased to 78. Pt A/O on arrival to ED.

## 2013-04-08 NOTE — H&P (Signed)
[facility] History and Physical  Mariah Miller XVQ:[id number] DOB: [birth year] DOA: 04/08/2013  Referring physician: ED physician PCP: No PCP Per Patient   Chief Complaint: confusion   HPI:  38 y.o. female hx of HTN, DM presented to [facility] ED with main concern of sudden onset generalized weakness and low sugar levels at home. She explains she is compliant with her medications and she was checking her sugar levels and noted numbers in 40-60's. She called EMS after she broke out in sweats and chills. This was associated with anterior chest discomfort with no specific radiating symptoms, no similar events in the past. Pt currently denies chest pain or shortness of breath, no specific abdominal or urinary concerns, no specific focal neurological symptoms.  In ED, initial CBG in 40's, no signs of acute events that could explain hypoglycemia events, no findings on CXR and UA unremarkable. TR[facility] asked to admit to medical floor. Pt's CBG in 70-80-'s at the time of the interview.  Assessment and Plan: Active Problems:   Hypoglycemia - no clear triggering event other than too much insulin taken    Diabetes mellitus, uncontrolled and with complications of neuropathy  - last A1C 03/2013 = 9.1 - hold home insulin regimen for now until CBG more stable - will monitor per floor protocol and will need to readjust the regimen as indicated - continue neurontin for neuropathies    HTN  - stable on admission - continue home medical regimen    Chest pain - no chest pain at this time, this was likely from hypoglycemia  - will cycle CE's, 12 lead EKG, UDS, TSH   Code Status: Full Family Communication: Pt at bedside Disposition Plan: Admit to medical floor    Review of Systems:  Constitutional: Negative for fever, chills HENT: Negative for hearing loss, ear pain, nosebleeds, congestion, sore throat, neck pain, tinnitus and ear discharge.   Eyes: Negative for blurred vision, double vision,  photophobia, pain, discharge and redness.  Respiratory: Negative for cough, hemoptysis, sputum production, shortness of breath, wheezing and stridor.   Cardiovascular: Negative for palpitations, orthopnea, claudication and leg swelling.  Gastrointestinal: Negative for heartburn, constipation, blood in stool and melena.  Genitourinary: Negative for dysuria, urgency, frequency, hematuria and flank pain.  Musculoskeletal: Negative for myalgias, back pain, joint pain and falls.  Skin: Negative for itching and rash.  Neurological: Negative for tingling, tremors, sensory change, speech change, focal weakness, loss of consciousness and headaches.  Endo/Heme/Allergies: Negative for environmental allergies and polydipsia. Does not bruise/bleed easily.  Psychiatric/Behavioral: Negative for suicidal ideas. The patient is not nervous/anxious.      Past Medical History  Diagnosis Date  . Hypertension   . Diabetes mellitus without complication     Past Surgical History  Procedure Laterality Date  . Cholecystectomy    . Tonsillectomy    . Tubal ligation    . Cyst excision      Social History:  reports that she has never smoked. She does not have any smokeless tobacco history on file. She reports that she does not drink alcohol or use illicit drugs.  No Known Allergies  No known family medical history   Prior to Admission medications   Medication Sig Start Date End Date Taking? Authorizing Provider  aspirin EC 81 MG tablet Take 1 tablet (81 mg total) by mouth daily. 01/22/13  Yes Nishant Dhungel, MD  gabapentin (NEURONTIN) 300 MG capsule Take 1 capsule (300 mg total) by mouth 3 (three) times daily. 03/21/13  Yes  Angelica Chessman, MD  insulin NPH-regular Human (NOVOLIN 70/30) (70-30) 100 UNIT/ML injection Inject 40 Units into the skin 2 (two) times daily with a meal. 03/21/13  Yes Angelica Chessman, MD  lisinopril-hydrochlorothiazide (PRINZIDE,ZESTORETIC) 20-12.5 MG per tablet Take 1 tablet by  mouth daily.   Yes Historical Provider, MD  metFORMIN (GLUCOPHAGE) 1000 MG tablet Take 1 tablet (1,000 mg total) by mouth 2 (two) times daily with a meal. 12/31/12  Yes Carvel Getting, NP  simvastatin (ZOCOR) 10 MG tablet Take 1 tablet (10 mg total) by mouth at bedtime. 01/22/13  Yes Nishant Dhungel, MD  glucose blood test strip Use as instructed 01/22/13   Nishant Dhungel, MD  glucose monitoring kit (FREESTYLE) monitoring kit 1 each by Does not apply route as needed for other. 01/22/13   Nishant Dhungel, MD  Insulin Syringes, Disposable, U-100 0.3 ML MISC Use to inject as directed by the physician 01/25/13   Angelica Chessman, MD  Lancets (ACCU-CHEK MULTICLIX) lancets Use as instructed 01/22/13   Louellen Molder, MD    Physical Exam: Filed Vitals:   04/08/13 1651 04/08/13 1741  BP: 119/69 118/59  Pulse: 106 99  Temp: 98 F (36.7 C)   TempSrc: Oral   Resp: 18 18  SpO2: 100% 100%    Physical Exam  Constitutional: Appears well-developed and well-nourished. No distress.  HENT: Normocephalic. External right and left ear normal. Oropharynx is clear and moist.  Eyes: Conjunctivae and EOM are normal. PERRLA, no scleral icterus.  Neck: Normal ROM. Neck supple. No JVD. No tracheal deviation. No thyromegaly.  CVS: RRR, S1/S2 +, no murmurs, no gallops, no carotid bruit.  Pulmonary: Effort and breath sounds normal, no stridor, rhonchi, wheezes, rales.  Abdominal: Soft. BS +,  no distension, tenderness, rebound or guarding.  Musculoskeletal: Normal range of motion. No edema and no tenderness.  Lymphadenopathy: No lymphadenopathy noted, cervical, inguinal. Neuro: Alert. Normal reflexes, muscle tone coordination. No cranial nerve deficit. Skin: Skin is warm and dry. No rash noted. Not diaphoretic. No erythema. No pallor.  Psychiatric: Normal mood and affect. Behavior, judgment, thought content normal.   Labs on Admission:  Basic Metabolic Panel:  Recent Labs Lab 04/08/13 1659  NA 145  K  4.0  CL 106  CO2 26  GLUCOSE 34*  BUN 7  CREATININE 0.83  CALCIUM 9.2   Liver Function Tests:  Recent Labs Lab 04/08/13 1659  AST 17  ALT 13  ALKPHOS 56  BILITOT <0.2*  PROT 7.5  ALBUMIN 3.4*   CBC:  Recent Labs Lab 04/08/13 1659  WBC 8.0  NEUTROABS 4.2  HGB 11.9*  HCT 35.0*  MCV 90.2  PLT 373   CBG:  Recent Labs Lab 04/08/13 1645 04/08/13 1817 04/08/13 1931  GLUCAP 47* 115* 87    Radiological Exams on Admission: Dg Chest 2 View   04/08/2013   No acute abnormality.    EKG: Normal sinus rhythm, no ST/T wave changes  Faye Ramsay, MD  [facility] Pager [phone]  If 7PM-7AM, please contact night-coverage [email or web address] Password [email or web address] 04/08/2013, 7:34 PM

## 2013-04-08 NOTE — ED Notes (Signed)
PT also reported falling 3 times at home today.

## 2013-04-08 NOTE — Progress Notes (Signed)
Called and received report from ED RN Thayer Ohmhris.

## 2013-04-08 NOTE — ED Provider Notes (Addendum)
CSN: [id number]     Arrival date & time 04/08/13  1637 History   First MD Initiated Contact with Patient 04/08/13 1702     Chief Complaint  Patient presents with  . Hypoglycemia  . Chest Pain   (Consider location/radiation/quality/duration/timing/severity/associated sxs/prior Treatment) The history is provided by the patient.  Mariah Miller is a 38 y.o. female hx of HTN, DM here presenting with hypoglycemia. She's been taking her humalog and metformin. However over the last week she noticed that her blood sugar has been running slightly low. However this morning she noticed that her blood sugar decreased to 40 and she had some cold sweats and blurry vision and chest pain. She called EMS and was given nitroglycerin, D50 and blood sugar increased to 78. She uses Humalog in the morning and at night. She ate breakfast and lunch. Denies any fevers or chills or abdominal pain or vomiting. She has been compliant with her meds.    Past Medical History  Diagnosis Date  . Hypertension   . Diabetes mellitus without complication    Past Surgical History  Procedure Laterality Date  . Cholecystectomy    . Tonsillectomy    . Tubal ligation    . Cyst excision     No family history on file. History  Substance Use Topics  . Smoking status: Never Smoker   . Smokeless tobacco: Not on file  . Alcohol Use: No   OB History   Grav Para Term Preterm Abortions TAB SAB Ect Mult Living                 Review of Systems  Cardiovascular: Positive for chest pain.  Neurological: Positive for weakness.  All other systems reviewed and are negative.    Allergies  Review of patient's allergies indicates no known allergies.  Home Medications   Current Outpatient Rx  Name  Route  Sig  Dispense  Refill  . aspirin EC 81 MG tablet   Oral   Take 1 tablet (81 mg total) by mouth daily.   30 tablet   5   . gabapentin (NEURONTIN) 300 MG capsule   Oral   Take 1 capsule (300 mg total) by mouth 3  (three) times daily.   60 capsule   2   . insulin NPH-regular Human (NOVOLIN 70/30) (70-30) 100 UNIT/ML injection   Subcutaneous   Inject 40 Units into the skin 2 (two) times daily with a meal.   30 mL   12   . lisinopril-hydrochlorothiazide (PRINZIDE,ZESTORETIC) 20-12.5 MG per tablet   Oral   Take 1 tablet by mouth daily.         . metFORMIN (GLUCOPHAGE) 1000 MG tablet   Oral   Take 1 tablet (1,000 mg total) by mouth 2 (two) times daily with a meal.   60 tablet   2   . simvastatin (ZOCOR) 10 MG tablet   Oral   Take 1 tablet (10 mg total) by mouth at bedtime.   90 tablet   3   . glucose blood test strip      Use as instructed   100 each   12   . glucose monitoring kit (FREESTYLE) monitoring kit   Does not apply   1 each by Does not apply route as needed for other.   1 each   0   . Insulin Syringes, Disposable, U-100 0.3 ML MISC      Use to inject as directed by the physician  100 each   0   . Lancets (ACCU-CHEK MULTICLIX) lancets      Use as instructed   100 each   12    BP 118/59  Pulse 99  Temp(Src) 98 F (36.7 C) (Oral)  Resp 18  SpO2 100%  LMP 03/25/2013 Physical Exam  Nursing note and vitals reviewed. Constitutional: She is oriented to person, place, and time.  Overweight, slightly anxious   HENT:  Head: Normocephalic.  Mouth/Throat: Oropharynx is clear and moist.  Eyes: Conjunctivae are normal. Pupils are equal, round, and reactive to light.  Neck: Normal range of motion. Neck supple.  Cardiovascular: Regular rhythm and normal heart sounds.   Slightly tachy   Pulmonary/Chest: Effort normal and breath sounds normal. No respiratory distress. She has no wheezes. She has no rales. She exhibits no tenderness.  Abdominal: Soft. Bowel sounds are normal. She exhibits no distension. There is no tenderness. There is no rebound and no guarding.  Musculoskeletal: Normal range of motion. She exhibits no edema and no tenderness.  Neurological: She is  alert and oriented to person, place, and time. No cranial nerve deficit. Coordination normal.  Skin: Skin is warm and dry.  Psychiatric: She has a normal mood and affect. Her behavior is normal. Judgment and thought content normal.    ED Course  Procedures (including critical care time)  CRITICAL CARE Performed by: Darl Householder, DAVID   Total critical care time: 30 min   Critical care time was exclusive of separately billable procedures and treating other patients.  Critical care was necessary to treat or prevent imminent or life-threatening deterioration.  Critical care was time spent personally by me on the following activities: development of treatment plan with patient and/or surrogate as well as nursing, discussions with consultants, evaluation of patient's response to treatment, examination of patient, obtaining history from patient or surrogate, ordering and performing treatments and interventions, ordering and review of laboratory studies, ordering and review of radiographic studies, pulse oximetry and re-evaluation of patient's condition.   Labs Review Labs Reviewed  GLUCOSE, CAPILLARY - Abnormal; Notable for the following:    Glucose-Capillary 47 (*)    All other components within normal limits  CBC WITH DIFFERENTIAL - Abnormal; Notable for the following:    Hemoglobin 11.9 (*)    HCT 35.0 (*)    All other components within normal limits  COMPREHENSIVE METABOLIC PANEL - Abnormal; Notable for the following:    Glucose, Bld 34 (*)    Albumin 3.4 (*)    Total Bilirubin <0.2 (*)    GFR calc non Af Amer 90 (*)    All other components within normal limits  URINALYSIS, ROUTINE W REFLEX MICROSCOPIC - Abnormal; Notable for the following:    Ketones, ur 15 (*)    Protein, ur 30 (*)    All other components within normal limits  URINE MICROSCOPIC-ADD ON - Abnormal; Notable for the following:    Squamous Epithelial / LPF FEW (*)    All other components within normal limits  GLUCOSE,  CAPILLARY - Abnormal; Notable for the following:    Glucose-Capillary 115 (*)    All other components within normal limits  PREGNANCY, URINE  GLUCOSE, CAPILLARY  POCT I-STAT TROPONIN I   Imaging Review Dg Chest 2 View  04/08/2013   CLINICAL DATA:  Hyperglycemia. Weakness. Left chest pain. Left-sided body numbness.  EXAM: CHEST  2 VIEW  COMPARISON:  None.  FINDINGS: Poor inspiration. Normal sized heart. Clear lungs with normal vascularity. Mild thoracic spine  degenerative changes. Cholecystectomy clips.  IMPRESSION: No acute abnormality.   Electronically Signed   By: Enrique Sack M.D.   On: 04/08/2013 18:39    EKG Interpretation    Date/Time:  Monday April 08 2013 16:50:43 EST Ventricular Rate:  103 PR Interval:  118 QRS Duration: 90 QT Interval:  341 QTC Calculation: 446 R Axis:   68 Text Interpretation:  Sinus tachycardia Nonspecific repol abnormality, inferior leads No significant change since last tracing Confirmed by YAO  MD, DAVID [phone]) on 04/08/2013 5:01:52 PM            MDM   1. Hypoglycemia    Mariah Miller is a 38 y.o. female here with symptomatic hypoglycemia. She is alert now, nl neuro exam with CBG of 47. Will get labs to check kidney function. Will get CXR and UA to r/o infection. Will give glucagon IM and observe blood sugar.   7 PM CBG improved with glucagon and food. Labs otherwise unremarkable. I called Dr. Rockne Menghini regarding med adjustment, he recommend admit for observation for now.   7:36 PM She has chills now. Repeat CBG 87. Will start on D5 1/5 NS drip.   Wandra Arthurs, MD 04/08/13 1902  Wandra Arthurs, MD 04/08/13 [phone]

## 2013-04-09 DIAGNOSIS — I10 Essential (primary) hypertension: Secondary | ICD-10-CM

## 2013-04-09 LAB — BASIC METABOLIC PANEL
BUN: 6 mg/dL (ref 6–23)
CHLORIDE: 106 meq/L (ref 96–112)
CO2: 23 mEq/L (ref 19–32)
CREATININE: 0.85 mg/dL (ref 0.50–1.10)
Calcium: 8.4 mg/dL (ref 8.4–10.5)
GFR, EST NON AFRICAN AMERICAN: 87 mL/min — AB (ref >90)
Glucose, Bld: 84 mg/dL (ref 70–99)
POTASSIUM: 4 meq/L (ref 3.7–5.3)
Sodium: 139 mEq/L (ref 137–147)

## 2013-04-09 LAB — GLUCOSE, CAPILLARY
GLUCOSE-CAPILLARY: 164 mg/dL — AB (ref 70–99)
GLUCOSE-CAPILLARY: 178 mg/dL — AB (ref 70–99)
GLUCOSE-CAPILLARY: 77 mg/dL (ref 70–99)
GLUCOSE-CAPILLARY: 94 mg/dL (ref 70–99)
GLUCOSE-CAPILLARY: 95 mg/dL (ref 70–99)
Glucose-Capillary: 162 mg/dL — ABNORMAL HIGH (ref 70–99)
Glucose-Capillary: 169 mg/dL — ABNORMAL HIGH (ref 70–99)
Glucose-Capillary: 184 mg/dL — ABNORMAL HIGH (ref 70–99)
Glucose-Capillary: 187 mg/dL — ABNORMAL HIGH (ref 70–99)
Glucose-Capillary: 69 mg/dL — ABNORMAL LOW (ref 70–99)
Glucose-Capillary: 92 mg/dL (ref 70–99)
Glucose-Capillary: 93 mg/dL (ref 70–99)

## 2013-04-09 LAB — TSH: TSH: 1.22 u[IU]/mL (ref 0.350–4.500)

## 2013-04-09 LAB — CBC
HEMATOCRIT: 32.6 % — AB (ref 36.0–46.0)
Hemoglobin: 10.8 g/dL — ABNORMAL LOW (ref 12.0–15.0)
MCH: 30 pg (ref 26.0–34.0)
MCHC: 33.1 g/dL (ref 30.0–36.0)
MCV: 90.6 fL (ref 78.0–100.0)
Platelets: 323 10*3/uL (ref 150–400)
RBC: 3.6 MIL/uL — ABNORMAL LOW (ref 3.87–5.11)
RDW: 13.4 % (ref 11.5–15.5)
WBC: 9.4 10*3/uL (ref 4.0–10.5)

## 2013-04-09 LAB — HEMOGLOBIN A1C
HEMOGLOBIN A1C: 9 % — AB (ref <5.7)
MEAN PLASMA GLUCOSE: 212 mg/dL — AB (ref <117)

## 2013-04-09 MED ORDER — ACETAMINOPHEN 325 MG PO TABS
650.0000 mg | ORAL_TABLET | Freq: Four times a day (QID) | ORAL | Status: DC | PRN
  Administered 2013-04-09: 650 mg via ORAL
  Filled 2013-04-09: qty 2

## 2013-04-09 MED ORDER — INSULIN ASPART PROT & ASPART (70-30 MIX) 100 UNIT/ML ~~LOC~~ SUSP
15.0000 [IU] | Freq: Two times a day (BID) | SUBCUTANEOUS | Status: DC
  Administered 2013-04-09 – 2013-04-10 (×2): 15 [IU] via SUBCUTANEOUS
  Filled 2013-04-09 (×3): qty 10

## 2013-04-09 NOTE — Progress Notes (Signed)
Hypoglycemic Event  CBG: 69  Treatment: 15 GM carbohydrate snack  Symptoms: None  Follow-up CBG: Time:12:38 AM CBG Result:77  Possible Reasons for Event: Unknown  Comments/MD notified:    Mariah Miller E  Remember to initiate Hypoglycemia Order Set & complete

## 2013-04-09 NOTE — Progress Notes (Signed)
[facility] PROGRESS NOTE  [name] ZOX:[id number] DOB: May 20, 1975 DOA: 04/08/2013 PCP: No PCP Per Patient  HPI/Subjective: Mariah Miller, 38 yo female, with PMH of HTN and DM was brought to the ED with confusion, generalized weakness and low sugar levels at home. Pt states doing much better this morning. She states that her current regimen of insulin was increased in late December because of high blood sugars. Says it was increased to 40 units of Novolin 70/30.  Everything was going well until about a week ago when she started noticing that her blood sugars would drop down dramatically. She denies skipping meals and states eating enough and frequently. She denies drawing too much insulin out of bottle that may have caused these symptoms. Denies any recent fever or illness.  Assessment/Plan: Hypoglycemia - Unclear etiology-denies inadvertant excessive insulin adminstration, has not missed a meal.Insulin 70/30 was increased from 30 units to 40 units BID - CBG in 40s in ED, currently 93 - TSH is unremarkable - Stopped IVF, will monitor CBGs and will start on half of her insulin regimen if sugars do not drop again-monitor for another 24 hours-if no further hypoglycemic episodes then home on 2/4  Diabetes mellitus, uncontrolled and with complications of neuropathy  - Last A1C  = 9.0  - hold home insulin regimen for now until CBG more stable  - will monitor readjust the regimen as indicated  - continue gabapentin for neuropathies   HTN  - stable on admission  - continue home medical regimen   Chest pain  - no chest pain at this time, this was likely from hypoglycemia  - will cycle CE's, 12 lead EKG, UDS, TSH   DVT Prophylaxis:  Lovenox  Code Status: Full Family Communication: None Disposition Plan: Inpatient  Consultants:  None  Procedures:  None  Antibiotics:  None  Objective: Filed Vitals:   04/08/13 2021 04/08/13 2041 04/09/13 0432 04/09/13 0958  BP:    105/67 125/78  Pulse:   89 84  Temp:  98.6 F (37 C) 97.6 F (36.4 C) 98.9 F (37.2 C)  TempSrc:  Oral Oral Oral  Resp:   18 18  Height: 5\' 4"  (1.626 m)     Weight: 101.3 kg (223 lb 5.2 oz)     SpO2:   99% 100%    Intake/Output Summary (Last 24 hours) at 04/09/13 1116 Last data filed at 04/09/13 1010  Gross per 24 hour  Intake    120 ml  Output      0 ml  Net    120 ml   Filed Weights   04/08/13 2021  Weight: 101.3 kg (223 lb 5.2 oz)    Exam: General: Well developed, well nourished, NAD, appears stated age  HEENT:  PERR, EOMI, Anicteic Sclera, MMM. No pharyngeal erythema or exudates  Neck: Supple, no JVD, no masses  Cardiovascular: RRR, S1 S2 auscultated, no rubs, murmurs or gallops.   Respiratory: Clear to auscultation bilaterally with equal chest rise  Abdomen: Soft, nontender, nondistended, + bowel sounds  Extremities: warm dry without cyanosis clubbing or edema.  Neuro: AAOx3, cranial nerves grossly intact. Strength 5/5 in upper and lower extremities  Skin: Without rashes exudates or nodules.   Psych: Normal affect and demeanor with intact judgement and insight   Data Reviewed: Basic Metabolic Panel:  Recent Labs Lab 04/08/13 1659 04/08/13 2153 04/09/13 0540  NA 145 141 139  K 4.0 3.9 4.0  CL 106 105 106  CO2 26 24 23  GLUCOSE 34* 86 84  BUN 7 8 6   CREATININE 0.83 0.82 0.85  CALCIUM 9.2 8.8 8.4   Liver Function Tests:  Recent Labs Lab 04/08/13 1659  AST 17  ALT 13  ALKPHOS 56  BILITOT <0.2*  PROT 7.5  ALBUMIN 3.4*   No results found for this basename: LIPASE, AMYLASE,  in the last 168 hours No results found for this basename: AMMONIA,  in the last 168 hours CBC:  Recent Labs Lab 04/08/13 1659 04/09/13 0540  WBC 8.0 9.4  NEUTROABS 4.2  --   HGB 11.9* 10.8*  HCT 35.0* 32.6*  MCV 90.2 90.6  PLT 373 323   Cardiac Enzymes:  Recent Labs Lab 04/08/13 1945  TROPONINI <0.30   BNP (last 3 results) No results found for this basename:  PROBNP,  in the last 8760 hours CBG:  Recent Labs Lab 04/09/13 0037 04/09/13 0216 04/09/13 0253 04/09/13 0430 04/09/13 0737  GLUCAP 77 95 94 92 93    No results found for this or any previous visit (from the past 240 hour(s)).   Studies: Dg Chest 2 View  04/08/2013   CLINICAL DATA:  Hyperglycemia. Weakness. Left chest pain. Left-sided body numbness.  EXAM: CHEST  2 VIEW  COMPARISON:  None.  FINDINGS: Poor inspiration. Normal sized heart. Clear lungs with normal vascularity. Mild thoracic spine degenerative changes. Cholecystectomy clips.  IMPRESSION: No acute abnormality.   Electronically Signed   By: Gordan PaymentSteve  Reid M.D.   On: 04/08/2013 18:39    Scheduled Meds: . aspirin EC  81 mg Oral Daily  . enoxaparin (LOVENOX) injection  40 mg Subcutaneous Q24H  . gabapentin  300 mg Oral TID  . lisinopril  20 mg Oral Daily   And  . hydrochlorothiazide  12.5 mg Oral Daily  . simvastatin  10 mg Oral QHS   Continuous Infusions:   Active Problems:   Hypoglycemia    Martina SinnerIsaac Stappas, PA-S  [facility] Pager [phone]. If 7PM-7AM, please contact night-coverage at [email or web address], password [facility] 04/09/2013, 11:16 AM  LOS: 1 day   Attending Patient seen and examined,agree with the assessment and plan. Unclear etiology why she became hypoglycemic, stop IVF, Monitor CBG's if CBG>150 persistently will start half of her home regime of Insulin.  Windell NorfolkS Ghimire MD

## 2013-04-10 DIAGNOSIS — E1165 Type 2 diabetes mellitus with hyperglycemia: Secondary | ICD-10-CM

## 2013-04-10 DIAGNOSIS — IMO0001 Reserved for inherently not codable concepts without codable children: Secondary | ICD-10-CM

## 2013-04-10 LAB — GLUCOSE, CAPILLARY
GLUCOSE-CAPILLARY: 154 mg/dL — AB (ref 70–99)
GLUCOSE-CAPILLARY: 160 mg/dL — AB (ref 70–99)
GLUCOSE-CAPILLARY: 247 mg/dL — AB (ref 70–99)
Glucose-Capillary: 164 mg/dL — ABNORMAL HIGH (ref 70–99)
Glucose-Capillary: 169 mg/dL — ABNORMAL HIGH (ref 70–99)
Glucose-Capillary: 201 mg/dL — ABNORMAL HIGH (ref 70–99)

## 2013-04-10 MED ORDER — INSULIN NPH ISOPHANE & REGULAR (70-30) 100 UNIT/ML ~~LOC~~ SUSP: 30.0000 [IU] | Freq: Two times a day (BID) | SUBCUTANEOUS | Status: DC

## 2013-04-10 NOTE — Progress Notes (Signed)
NURSING PROGRESS NOTE  Mariah Miller [id number] Discharge Data: 04/10/2013 2:39 PM Attending Provider: Verlee Monte, MD YBR:[id number], Gabrielle Dare, MD     Karin Golden to be D/C'd Home per MD order.  Discussed with the patient the After Visit Summary and all questions fully answered. All IV's discontinued with no bleeding noted. All belongings returned to patient for patient to take home.   Last Vital Signs:  Blood pressure 107/69, pulse 87, temperature 98.1 F (36.7 C), temperature source Oral, resp. rate 18, height $RemoveBe'5\' 4"'GmULtgjlh$  (1.626 m), weight 101.3 kg (223 lb 5.2 oz), last menstrual period 03/25/2013, SpO2 100.00%.  Discharge Medication List   Medication List         accu-chek multiclix lancets  Use as instructed     aspirin EC 81 MG tablet  Take 1 tablet (81 mg total) by mouth daily.     gabapentin 300 MG capsule  Commonly known as:  NEURONTIN  Take 1 capsule (300 mg total) by mouth 3 (three) times daily.     glucose blood test strip  Use as instructed     glucose monitoring kit monitoring kit  1 each by Does not apply route as needed for other.     insulin NPH-regular Human (70-30) 100 UNIT/ML injection  Commonly known as:  NOVOLIN 70/30  Inject 30 Units into the skin 2 (two) times daily with a meal.     Insulin Syringes (Disposable) U-100 0.3 ML Misc  Use to inject as directed by the physician     lisinopril-hydrochlorothiazide 20-12.5 MG per tablet  Commonly known as:  PRINZIDE,ZESTORETIC  Take 1 tablet by mouth daily.     metFORMIN 1000 MG tablet  Commonly known as:  GLUCOPHAGE  Take 1 tablet (1,000 mg total) by mouth 2 (two) times daily with a meal.     simvastatin 10 MG tablet  Commonly known as:  ZOCOR  Take 1 tablet (10 mg total) by mouth at bedtime.

## 2013-04-10 NOTE — Discharge Summary (Signed)
Physician Discharge Summary  Mariah Miller JQB:[id number] DOB: [birth year] DOA: 04/08/2013  PCP: No PCP Per Patient  Admit date: 04/08/2013 Discharge date: 04/10/2013  Time spent: 45 minutes  Recommendations for Outpatient Follow-up:  1. F/u with PCP for reevaluation of current insulin regimen in 1 week. Was discharged on 30 units of current regimen  Discharge Diagnoses:  Active Problems:   Hypoglycemia   Discharge Condition: Stable  Diet recommendation: Heart healthy and carb modified  Filed Weights   04/08/13 2021  Weight: 101.3 kg (223 lb 5.2 oz)    History of present illness:  Mariah Miller, 38 yo female, with PMH of HTN and DM was brought to the ED with confusion, generalized weakness and low sugar levels at home.  Pt states doing much better this morning. She states that her current regimen of insulin was increased in late December because of high blood sugars. Says it was increased to 40 units of Novolin 70/30. Everything was going well until about a week ago when she started noticing that her blood sugars would drop down dramatically. She denies skipping meals and states eating enough and frequently. She denies drawing too much insulin out of bottle that may have caused these symptoms. Denies any recent fever or illness. She understands that she must f/u with her PCP within 7-10 days for evaluation of insulin regimen.   Hospital Course:  Hypoglycemia  - Unclear etiology-denies inadvertant excessive insulin adminstration, has not missed a meal. Insulin 70/30 was increased from 30 units to 40 units BID  - CBG in 40s in ED, currently 247 - TSH is unremarkable  - Stopped IVF, monitored CBGs for 24 hours.  - Will discharge pt on 30 units of current regimen and have her f/u with PCP within 1 week. -Instructed to followup with primary care physician, check her blood sugars twice a day at least and review the log with her PCP.  Diabetes mellitus, uncontrolled and with  complications of neuropathy  - Last A1C = 9.0  - held home insulin regimen until CBG were more stable  - continued gabapentin for neuropathies   HTN  - stable on admission  - continued home medical regimen   Chest pain  - no chest pain at this time, this was likely from hypoglycemia    Procedures: None  Consultations:  None  Discharge Exam: Filed Vitals:   04/09/13 2107  BP: 107/69  Pulse: 87  Temp: 98.1 F (36.7 C)  Resp: 18    General: Well developed, well nourished, NAD, appears stated age  HEENT: PERR, EOMI, Anicteic Sclera, MMM. No pharyngeal erythema or exudates  Neck: Supple, no JVD, no masses  Cardiovascular: RRR, S1 S2 auscultated, no rubs, murmurs or gallops.  Respiratory: Clear to auscultation bilaterally with equal chest rise  Abdomen: Soft, nontender, nondistended, + bowel sounds  Extremities: warm dry without cyanosis clubbing or edema.  Neuro: AAOx3, cranial nerves grossly intact. Strength 5/5 in upper and lower extremities  Skin: Without rashes exudates or nodules.  Psych: Normal affect and demeanor with intact judgement and insight  Discharge Instructions   Future Appointments Provider Department Dept Phone   04/25/2013 1:45 PM Catalina Antigua, MD [facility] [phone]   05/07/2013 4:45 PM Vevelyn Royals, [place] [facility] [phone]   06/17/2013 5:00 PM Jeanann Lewandowsky, MD [facility] [phone]       Medication List         accu-chek multiclix lancets  Use as instructed     aspirin EC 81 MG tablet  Take 1 tablet (81 mg total) by mouth daily.     gabapentin 300 MG capsule  Commonly known as:  NEURONTIN  Take 1 capsule (300 mg total) by mouth 3 (three) times daily.     glucose blood test strip  Use as instructed     glucose monitoring kit monitoring kit  1 each by Does not apply route as needed for other.     insulin NPH-regular Human (70-30) 100  UNIT/ML injection  Commonly known as:  NOVOLIN 70/30  Inject 30 Units into the skin 2 (two) times daily with a meal.     Insulin Syringes (Disposable) U-100 0.3 ML Misc  Use to inject as directed by the physician     lisinopril-hydrochlorothiazide 20-12.5 MG per tablet  Commonly known as:  PRINZIDE,ZESTORETIC  Take 1 tablet by mouth daily.     metFORMIN 1000 MG tablet  Commonly known as:  GLUCOPHAGE  Take 1 tablet (1,000 mg total) by mouth 2 (two) times daily with a meal.     simvastatin 10 MG tablet  Commonly known as:  ZOCOR  Take 1 tablet (10 mg total) by mouth at bedtime.       No Known Allergies     Follow-up Information   Follow up with No PCP Per Patient In 1 week.   Specialty:  General Practice   Contact information:   [place] [address] [phone]        The results of significant diagnostics from this hospitalization (including imaging, microbiology, ancillary and laboratory) are listed below for reference.    Significant Diagnostic Studies: Dg Chest 2 View  04/08/2013   CLINICAL DATA:  Hyperglycemia. Weakness. Left chest pain. Left-sided body numbness.  EXAM: CHEST  2 VIEW  COMPARISON:  None.  FINDINGS: Poor inspiration. Normal sized heart. Clear lungs with normal vascularity. Mild thoracic spine degenerative changes. Cholecystectomy clips.  IMPRESSION: No acute abnormality.   Electronically Signed   By: Enrique Sack M.D.   On: 04/08/2013 18:39     Labs: Basic Metabolic Panel:  Recent Labs Lab 04/08/13 1659 04/08/13 2153 04/09/13 0540  NA 145 141 139  K 4.0 3.9 4.0  CL 106 105 106  CO2 $Re'26 24 23  'zfE$ GLUCOSE 34* 86 84  BUN $Re'7 8 6  'bnZ$ CREATININE 0.83 0.82 0.85  CALCIUM 9.2 8.8 8.4   Liver Function Tests:  Recent Labs Lab 04/08/13 1659  AST 17  ALT 13  ALKPHOS 56  BILITOT <0.2*  PROT 7.5  ALBUMIN 3.4*   CBC:  Recent Labs Lab 04/08/13 1659 04/09/13 0540  WBC 8.0 9.4  NEUTROABS 4.2  --   HGB 11.9* 10.8*  HCT 35.0*  32.6*  MCV 90.2 90.6  PLT 373 323   Cardiac Enzymes:  Recent Labs Lab 04/08/13 1945  TROPONINI <0.30   CBG:  Recent Labs Lab 04/10/13 0240 04/10/13 0448 04/10/13 0648 04/10/13 0752 04/10/13 1122  GLUCAP 164* 169* 154* 201* 247*       Signed:  Carolan Clines, PA-S  [facility] 04/10/2013, 11:56 AM   Addendum  Patient seen and examined, chart and data base reviewed.  I agree with the above assessment and plan.  For full details please see Mr. Carolan Clines, PA-S note.  Mariah Miller, came in to the hospital with hypoglycemia after recent change in her diabetic insulin regimen.  Patient said since insulin was changed she  was struggling with low blood sugar.  Insulin switched back to 30 units twice a day, patient to followup with primary care physician.   Birdie Hopes, MD [facility] Pager: [phone] 04/10/2013, 2:20 PM

## 2013-04-10 NOTE — Care Management Note (Signed)
    Page 1 of 1   04/10/2013     3:00:21 PM   CARE MANAGEMENT NOTE 04/10/2013  Patient:  Mariah Miller,Mariah Miller   Account Number:  [id number]  Date Initiated:  04/10/2013  Documentation initiated by:  Letha CapeAYLOR,Jamileth Putzier  Subjective/Objective Assessment:   dx hypoglycemia/cp  admit- lives with mother.  Has orange card.     Action/Plan:   Anticipated DC Date:  04/10/2013   Anticipated DC Plan:  HOME/SELF CARE      DC Planning Services  CM consult      Choice offered to / List presented to:             Status of service:  Completed, signed off Medicare Important Message given?   (If response is "NO", the following Medicare IM given date fields will be blank) Date Medicare IM given:   Date Additional Medicare IM given:    Discharge Disposition:  HOME/SELF CARE  Per UR Regulation:  Reviewed for med. necessity/level of care/duration of stay  If discussed at Long Length of Stay Meetings, dates discussed:    Comments:

## 2013-04-10 NOTE — Progress Notes (Signed)
Inpatient Diabetes Program Recommendations  AACE/ADA: New Consensus Statement on Inpatient Glycemic Control (2013)  Target Ranges:  Prepandial:   less than 140 mg/dL      Peak postprandial:   less than 180 mg/dL (1-2 hours)      Critically ill patients:  140 - 180 mg/dL   Discharge orders:Please change to 30 units 70/30 bid  Pt was taking a total of 80 units per day: 40 units in the am and 40 units ac supper. Current orders for Inpatient glycemic control:  Inpatient Diabetes Program Recommendations Insulin - Basal: Noted 70/30 decreased to 15 units bid, starting yesterday evening. (Home dose is 40 units 70/30?  20 units bid or 40 units bid) Spoke with patient who states her PCP increased her to 40 units bid and understands her discharge orders to be 30 units bid (due to hypoglycemia with the 40 units/day. Text paged Dr Arthor CaptainElmahi.  Pt waiting for discharge orders. Thank you, Lenor CoffinAnn Tinita Brooker, RN, CNS, Diabetes Coordinator [phone])

## 2013-04-10 NOTE — Discharge Instructions (Signed)
You were admitted because your blood sugar had decreased to dangerous levels. Consequently, you suffered the symptoms that you presented with. You are being discharged on 30 units of your current regimen. Follow up with your PCP within 7-10 days for reevaluation of your regimen.

## 2013-04-11 ENCOUNTER — Telehealth: Payer: Self-pay | Admitting: Internal Medicine

## 2013-04-11 NOTE — Telephone Encounter (Signed)
Pt left message saying her sugar level felt like it was dropping and she felt tingling in her hands and feet. Please f/u with pt.

## 2013-04-11 NOTE — Telephone Encounter (Signed)
PT.  Dropped off disability paperwork. Paperwork needs to be filled out by Dr. Hyman HopesJegede.

## 2013-04-25 ENCOUNTER — Encounter: Payer: Self-pay | Admitting: Obstetrics and Gynecology

## 2013-05-07 ENCOUNTER — Encounter: Payer: No Typology Code available for payment source | Attending: Internal Medicine | Admitting: *Deleted

## 2013-05-07 ENCOUNTER — Encounter: Payer: Self-pay | Admitting: *Deleted

## 2013-05-07 VITALS — Ht 64.0 in | Wt 221.4 lb

## 2013-05-07 DIAGNOSIS — Z713 Dietary counseling and surveillance: Secondary | ICD-10-CM | POA: Insufficient documentation

## 2013-05-07 DIAGNOSIS — E119 Type 2 diabetes mellitus without complications: Secondary | ICD-10-CM | POA: Insufficient documentation

## 2013-05-07 DIAGNOSIS — E1110 Type 2 diabetes mellitus with ketoacidosis without coma: Secondary | ICD-10-CM

## 2013-05-07 NOTE — Progress Notes (Signed)
Appt start time: 1700 end time:  1800.  Assessment:  Patient was seen on  05/07/13 for individual diabetes education. She has had diabetes for about 15 years. Recent hospitalization for hypoglycemia last month. She buys and cooks her own foods. She exercises by taking the stairs when she can't get out to walk. SMBG 4 times a day, with reported range in last 2 weeks was 68-178. She lives with her Mom who attended about half of the visit.  Current HbA1c: 9.0%  Preferred Learning Style:   No preference indicated   Learning Readiness:   Ready  Change in progress  MEDICATIONS: see list. Diabetes medications are Metformin and Humulin 70/30  DIETARY INTAKE:  24-hr recall:  B ( AM): eggs, with bacon or sausage OR regular oatmeal, milk or juice to drink Snk ( AM): no  L ( PM): left overs  Snk ( PM): no D ( PM): meat, occasionally a starch, vegetables, diet soda or Kool - aid Snk ( PM): no Beverages: milk, juice or diet soda  Usual physical activity: walks the steps at her apartment complex  Estimated energy needs: 1400 calories 158 g carbohydrates 105 g protein 39 g fat    Intervention:  Nutrition counseling provided.  Discussed diabetes disease process and treatment options.  Discussed physiology of diabetes and role of obesity on insulin resistance.  Encouraged moderate weight reduction to improve glucose levels.  Discussed role of medications and diet in glucose control  Provided education on macronutrients on glucose levels.  Provided education on carb counting, importance of regularly scheduled meals/snacks, and meal planning  Discussed effects of physical activity on glucose levels and long-term glucose control.  Recommended she include daily activity as tolerated.  Reviewed patient medications.  Discussed role of medication on blood glucose and possible side effects. Instructed on insulin action and risk of hypoglycemia if not taken correctly.  Discussed blood glucose  monitoring and interpretation.  Discussed recommended target ranges and individual ranges.    Described short-term complications: hyper- and hypo-glycemia.  Discussed causes,symptoms, and treatment options.  Discussed prevention, detection, and treatment of long-term complications.  Discussed the role of prolonged elevated glucose levels on body systems.  Discussed role of stress on blood glucose levels and discussed strategies to manage psychosocial issues.  Discussed recommendations for long-term diabetes self-care.  Established checklist for medical, dental, and emotional self-care.  Plan to discuss grocery list and meal planning at next visit.  Plan:  Aim for 2 Carb Choices per meal (30 grams) +/- 1 either way  Aim for 0-1 Carbs per snack if hungry  Consider reading food labels for Total Carbohydrate of foods Continue with your activity level daily as tolerated Continue  checking BG at alternate times per day as directed by MD  Continue taking medication: Humulin 70/30 before breakfast and supper as directed by MD  Teaching Method Utilized: Visual, Auditory and Hands on  Handouts given during visit include: Living Well with Diabetes Carb Counting and Food Label handouts Meal Plan Card Insulin action handout  Barriers to learning/adherence to lifestyle change: none  Diabetes self-care support plan:   [facility] support group offered  Demonstrated degree of understanding via:  Teach Back   Monitoring/Evaluation:  Dietary intake, exercise, SMBG, and body weight in 6 week(s).

## 2013-05-07 NOTE — Patient Instructions (Signed)
Plan:  Aim for 2 Carb Choices per meal (30 grams) +/- 1 either way  Aim for 0-1 Carbs per snack if hungry  Consider reading food labels for Total Carbohydrate of foods Continue with your activity level daily as tolerated Continue  checking BG at alternate times per day as directed by MD  Continue taking medication: Humulin 70/30 before breakfast and supper as directed by MD

## 2013-05-10 NOTE — Telephone Encounter (Signed)
Pt called about disability paperwork. Says she dropped it off but has not heard anything back. Pt dropped off paperwork on 04/11/13

## 2013-06-05 ENCOUNTER — Encounter: Payer: Self-pay | Admitting: Obstetrics & Gynecology

## 2013-06-17 ENCOUNTER — Encounter: Payer: Self-pay | Admitting: Internal Medicine

## 2013-06-17 ENCOUNTER — Ambulatory Visit: Payer: No Typology Code available for payment source | Attending: Internal Medicine | Admitting: Internal Medicine

## 2013-06-17 VITALS — BP 145/83 | HR 83 | Temp 98.3°F | Resp 16 | Ht 64.0 in | Wt 223.0 lb

## 2013-06-17 DIAGNOSIS — E119 Type 2 diabetes mellitus without complications: Secondary | ICD-10-CM | POA: Insufficient documentation

## 2013-06-17 DIAGNOSIS — IMO0002 Reserved for concepts with insufficient information to code with codable children: Secondary | ICD-10-CM | POA: Insufficient documentation

## 2013-06-17 DIAGNOSIS — E1165 Type 2 diabetes mellitus with hyperglycemia: Secondary | ICD-10-CM | POA: Insufficient documentation

## 2013-06-17 DIAGNOSIS — IMO0001 Reserved for inherently not codable concepts without codable children: Secondary | ICD-10-CM | POA: Insufficient documentation

## 2013-06-17 LAB — GLUCOSE, POCT (MANUAL RESULT ENTRY): POC Glucose: 188 mg/dl — AB (ref 70–99)

## 2013-06-17 MED ORDER — INSULIN ASPART 100 UNIT/ML ~~LOC~~ SOLN: SUBCUTANEOUS | Status: DC

## 2013-06-17 NOTE — Progress Notes (Signed)
Pt is here following up on her HTN and diabetes. Pt reports having occasional swelling of her feet and legs w/ pain.

## 2013-06-17 NOTE — Patient Instructions (Signed)

## 2013-06-19 ENCOUNTER — Ambulatory Visit: Payer: No Typology Code available for payment source | Attending: Internal Medicine

## 2013-06-19 NOTE — Progress Notes (Signed)
Patient ID: Mariah Miller, female   DOB: 18-Feb-1976, 38 y.o.   MRN: [id number]   Klare Criss, is a 38 y.o. female  ZTI:[id number]  ASN:[id number]  DOB - 1976-01-31  Chief Complaint  Patient presents with  . Follow-up        Subjective:   Mariah Miller is a 38 y.o. female here today for a follow up visit. Pt is here following up on her HTN and diabetes.  Pt reports having occasional swelling of her feet and legs w/ pain especially after standing for long time. Patient's blood sugar is still uncontrolled with some blood sugar readings above 250 despite current insulin regimen of 35 units of NovoLog 70/30 every 12 hr. because of uncontrolled blood sugar it was increased to 40 units twice a day but she developed hypoglycemic episode and had to go to the ER where it was reduced to 35 and blood sugar is uncontrolled yet. She is also on metformin 1000 mg tablet daily twice a day. Her blood pressure is better controlled. Her leg swelling is only occasionally.  Denies chest pain or shortness of breath. She does not smoke cigarette she does not drink alcohol Patient has No headache, No chest pain, No abdominal pain - No Nausea, No new weakness tingling or numbness, No Cough - SOB.  No problems updated.  ALLERGIES: No Known Allergies  PAST MEDICAL HISTORY: Past Medical History  Diagnosis Date  . Hypertension   . Diabetes mellitus without complication     MEDICATIONS AT HOME: Prior to Admission medications   Medication Sig Start Date End Date Taking? Authorizing Provider  aspirin EC 81 MG tablet Take 1 tablet (81 mg total) by mouth daily. 01/22/13  Yes Nishant Dhungel, MD  gabapentin (NEURONTIN) 300 MG capsule Take 1 capsule (300 mg total) by mouth 3 (three) times daily. 03/21/13  Yes Angelica Chessman, MD  glucose blood test strip Use as instructed 01/22/13  Yes Nishant Dhungel, MD  glucose monitoring kit (FREESTYLE) monitoring kit 1 each by Does not apply route as needed for  other. 01/22/13  Yes Nishant Dhungel, MD  insulin NPH-regular Human (NOVOLIN 70/30) (70-30) 100 UNIT/ML injection Inject 30 Units into the skin 2 (two) times daily with a meal. 04/10/13  Yes Verlee Monte, MD  Insulin Syringes, Disposable, U-100 0.3 ML MISC Use to inject as directed by the physician 01/25/13  Yes Angelica Chessman, MD  Lancets (ACCU-CHEK MULTICLIX) lancets Use as instructed 01/22/13  Yes Nishant Dhungel, MD  lisinopril-hydrochlorothiazide (PRINZIDE,ZESTORETIC) 20-12.5 MG per tablet Take 1 tablet by mouth daily.   Yes Historical Provider, MD  metFORMIN (GLUCOPHAGE) 1000 MG tablet Take 1 tablet (1,000 mg total) by mouth 2 (two) times daily with a meal. 12/31/12  Yes Carvel Getting, NP  simvastatin (ZOCOR) 10 MG tablet Take 1 tablet (10 mg total) by mouth at bedtime. 01/22/13  Yes Nishant Dhungel, MD  insulin aspart (NOVOLOG) 100 UNIT/ML injection 150 - 200 = 3 units, 201 - 250 = 5 units, 251 - 300 = 7 units 301 - 350 = 10 units, > 350, call or come to clinic 06/17/13   Angelica Chessman, MD     Objective:   Filed Vitals:   06/17/13 1737  BP: 145/83  Pulse: 83  Temp: 98.3 F (36.8 C)  TempSrc: Oral  Resp: 16  Height: $Remove'5\' 4"'BdXHVmC$  (1.626 m)  Weight: 223 lb (101.152 kg)  SpO2: 100%    Exam General appearance : Awake, alert, not in any distress. Speech Clear. Not  toxic looking, obese HEENT: Atraumatic and Normocephalic, pupils equally reactive to light and accomodation Neck: supple, no JVD. No cervical lymphadenopathy.  Chest:Good air entry bilaterally, no added sounds  CVS: S1 S2 regular, no murmurs.  Abdomen: Bowel sounds present, Non tender and not distended with no gaurding, rigidity or rebound. Extremities: B/L Lower Ext shows no edema, both legs are warm to touch Neurology: Awake alert, and oriented X 3, CN II-XII intact, Non focal Skin:No Rash Wounds:N/A  Data Review Lab Results  Component Value Date   HGBA1C 9.0* 04/08/2013   HGBA1C 9.1 03/21/2013   HGBA1C 10.8*  12/31/2012     Assessment & Plan   2. Uncontrolled diabetes mellitus Will add insulin to scale, patient has been advised to record blood sugar and how much insulin to scale she uses, no determined how much to her to use insulin going forward. 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  The patient was given clear instructions to go to ER or return to medical center if symptoms don't improve, worsen or new problems develop. The patient verbalized understanding. The patient was told to call to get lab results if they haven't heard anything in the next week.   This note has been created with Surveyor, quantity. Any transcriptional errors are unintentional.    Angelica Chessman, MD, [place], [place], [place] and [facility] [place], [place]   06/19/2013, 8:47 PM

## 2013-06-20 ENCOUNTER — Ambulatory Visit: Payer: Self-pay | Admitting: Internal Medicine

## 2013-06-20 ENCOUNTER — Encounter: Payer: Self-pay | Admitting: *Deleted

## 2013-06-20 ENCOUNTER — Other Ambulatory Visit: Payer: Self-pay | Admitting: Internal Medicine

## 2013-06-20 DIAGNOSIS — E119 Type 2 diabetes mellitus without complications: Secondary | ICD-10-CM

## 2013-06-20 DIAGNOSIS — E1110 Type 2 diabetes mellitus with ketoacidosis without coma: Secondary | ICD-10-CM

## 2013-06-20 MED ORDER — INSULIN NPH ISOPHANE & REGULAR (70-30) 100 UNIT/ML ~~LOC~~ SUSP: 30.0000 [IU] | Freq: Two times a day (BID) | SUBCUTANEOUS | Status: DC

## 2013-06-20 MED ORDER — INSULIN ASPART 100 UNIT/ML ~~LOC~~ SOLN: SUBCUTANEOUS | Status: DC

## 2013-06-25 ENCOUNTER — Ambulatory Visit: Payer: No Typology Code available for payment source | Admitting: *Deleted

## 2013-07-05 ENCOUNTER — Ambulatory Visit: Payer: No Typology Code available for payment source | Admitting: Internal Medicine

## 2013-07-11 ENCOUNTER — Ambulatory Visit: Payer: No Typology Code available for payment source | Admitting: Internal Medicine

## 2013-08-14 ENCOUNTER — Telehealth: Payer: Self-pay | Admitting: Internal Medicine

## 2013-08-14 NOTE — Telephone Encounter (Signed)
Pt has called in today to request a letter with documentation about her being diabetic; pt has just recently started new employment; pt says she would like to come by the office to pick up the letter at your earliest convenience; please f/u with pt for a pick up time/date

## 2013-08-14 NOTE — Telephone Encounter (Signed)
Pt has called in today to request a letterhead detailing her diagnosis as a diabetic; pt has just recently began new employment and needs to provide this documentation; please f/u with pt to coordinate a date/time for pick up

## 2013-08-14 NOTE — Telephone Encounter (Signed)
Please f/u with pt

## 2013-08-15 ENCOUNTER — Ambulatory Visit: Payer: No Typology Code available for payment source | Admitting: Internal Medicine

## 2013-08-15 NOTE — Telephone Encounter (Signed)
Pt requesting letterhead of Diagnosis of Diabetes for recent employment.  Is this ok to write letter?

## 2013-08-18 ENCOUNTER — Emergency Department (HOSPITAL_COMMUNITY): Payer: No Typology Code available for payment source

## 2013-08-18 ENCOUNTER — Encounter (HOSPITAL_COMMUNITY): Payer: Self-pay | Admitting: Emergency Medicine

## 2013-08-18 ENCOUNTER — Emergency Department (HOSPITAL_COMMUNITY)
Admission: EM | Admit: 2013-08-18 | Discharge: 2013-08-18 | Disposition: A | Discharge status: Home / Self Care | Payer: Self-pay | Attending: Emergency Medicine | Admitting: Emergency Medicine

## 2013-08-18 DIAGNOSIS — Z79899 Other long term (current) drug therapy: Secondary | ICD-10-CM | POA: Insufficient documentation

## 2013-08-18 DIAGNOSIS — R519 Headache, unspecified: Secondary | ICD-10-CM

## 2013-08-18 DIAGNOSIS — Z794 Long term (current) use of insulin: Secondary | ICD-10-CM | POA: Insufficient documentation

## 2013-08-18 DIAGNOSIS — R638 Other symptoms and signs concerning food and fluid intake: Secondary | ICD-10-CM | POA: Insufficient documentation

## 2013-08-18 DIAGNOSIS — E119 Type 2 diabetes mellitus without complications: Secondary | ICD-10-CM | POA: Insufficient documentation

## 2013-08-18 DIAGNOSIS — I10 Essential (primary) hypertension: Secondary | ICD-10-CM | POA: Insufficient documentation

## 2013-08-18 DIAGNOSIS — G43909 Migraine, unspecified, not intractable, without status migrainosus: Secondary | ICD-10-CM | POA: Insufficient documentation

## 2013-08-18 DIAGNOSIS — R51 Headache: Secondary | ICD-10-CM

## 2013-08-18 DIAGNOSIS — R42 Dizziness and giddiness: Secondary | ICD-10-CM | POA: Insufficient documentation

## 2013-08-18 DIAGNOSIS — Z7982 Long term (current) use of aspirin: Secondary | ICD-10-CM | POA: Insufficient documentation

## 2013-08-18 HISTORY — DX: Migraine, unspecified, not intractable, without status migrainosus: G43.909

## 2013-08-18 LAB — BASIC METABOLIC PANEL
BUN: 12 mg/dL (ref 6–23)
CHLORIDE: 101 meq/L (ref 96–112)
CO2: 23 meq/L (ref 19–32)
Calcium: 9.6 mg/dL (ref 8.4–10.5)
Creatinine, Ser: 0.8 mg/dL (ref 0.50–1.10)
GFR calc Af Amer: >90 mL/min (ref >90)
GFR calc non Af Amer: >90 mL/min (ref >90)
Glucose, Bld: 189 mg/dL — ABNORMAL HIGH (ref 70–99)
POTASSIUM: 4 meq/L (ref 3.7–5.3)
SODIUM: 139 meq/L (ref 137–147)

## 2013-08-18 LAB — CBC
HCT: 36.3 % (ref 36.0–46.0)
Hemoglobin: 12.1 g/dL (ref 12.0–15.0)
MCH: 28.9 pg (ref 26.0–34.0)
MCHC: 33.3 g/dL (ref 30.0–36.0)
MCV: 86.6 fL (ref 78.0–100.0)
PLATELETS: 351 10*3/uL (ref 150–400)
RBC: 4.19 MIL/uL (ref 3.87–5.11)
RDW: 13.7 % (ref 11.5–15.5)
WBC: 7.6 10*3/uL (ref 4.0–10.5)

## 2013-08-18 LAB — CBG MONITORING, ED: GLUCOSE-CAPILLARY: 184 mg/dL — AB (ref 70–99)

## 2013-08-18 MED ORDER — METOCLOPRAMIDE HCL 5 MG/ML IJ SOLN
10.0000 mg | Freq: Once | INTRAMUSCULAR | Status: AC
  Administered 2013-08-18: 10 mg via INTRAVENOUS
  Filled 2013-08-18: qty 2

## 2013-08-18 MED ORDER — DIPHENHYDRAMINE HCL 50 MG/ML IJ SOLN
25.0000 mg | Freq: Once | INTRAMUSCULAR | Status: AC
  Administered 2013-08-18: 25 mg via INTRAVENOUS
  Filled 2013-08-18: qty 1

## 2013-08-18 MED ORDER — NAPROXEN 375 MG PO TABS: 375.0000 mg | ORAL_TABLET | Freq: Two times a day (BID) | ORAL | Status: DC

## 2013-08-18 MED ORDER — SODIUM CHLORIDE 0.9 % IV BOLUS (SEPSIS)
1000.0000 mL | Freq: Once | INTRAVENOUS | Status: AC
  Administered 2013-08-18: 1000 mL via INTRAVENOUS

## 2013-08-18 MED ORDER — ONDANSETRON 4 MG PO TBDP
8.0000 mg | ORAL_TABLET | Freq: Once | ORAL | Status: AC
  Administered 2013-08-18: 8 mg via ORAL
  Filled 2013-08-18: qty 2

## 2013-08-18 MED ORDER — METOCLOPRAMIDE HCL 10 MG PO TABS: 10.0000 mg | ORAL_TABLET | Freq: Four times a day (QID) | ORAL | Status: DC

## 2013-08-18 NOTE — ED Notes (Signed)
Treatment delayed the pt has been in mri for the past 45 minutes and she still needs to get her c-t head done.  Med and iv was not started before she left and her blood has not been drawn

## 2013-08-18 NOTE — ED Notes (Signed)
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  She is due to work [employer] and  She is afraid that she will get woozy and fall into one of the large fans

## 2013-08-18 NOTE — ED Notes (Signed)
The pt returned from mri c-t cancelled.  Iv started and iv med given

## 2013-08-18 NOTE — ED Notes (Signed)
Pt reports having headache since Thursday night. Having n/v today and blurred vision. Hx of migraines.

## 2013-08-18 NOTE — ED Notes (Signed)
The pt is c/o a headache since Friday with nausea.

## 2013-08-18 NOTE — ED Notes (Signed)
Pt med given vitals checked she is drowxy from the meds

## 2013-08-18 NOTE — ED Notes (Signed)
Marcelle SmilingNatasha, NT aware of pt

## 2013-08-18 NOTE — ED Provider Notes (Signed)
CSN: [id number]     Arrival date & time 08/18/13  1520 History   First MD Initiated Contact with Patient 08/18/13 1637     Chief Complaint  Patient presents with  . Migraine     (Consider location/radiation/quality/duration/timing/severity/associated sxs/prior Treatment) HPI Comments: 38 year old female with history of diabetes, high blood pressure, hypoglycemia, migraines presents with headache and vision changes. Patient had gradual onset headache since after her night shift on Thursday has gradually worsened since. This is overall similar migraines however she's not had one over a year. No head injury, no neck stiffness or fever. Mild nausea no vomiting.  Patient feels intermittent dizziness worse with standing and walking. Patient has floaters in bilateral eyes without decreased vision loss clarified after reading nurses note. Headache generalized.  Patient is a 38 y.o. female presenting with migraines. The history is provided by the patient.  Migraine Pertinent negatives include no chest pain, no abdominal pain, no headaches and no shortness of breath.    Past Medical History  Diagnosis Date  . Hypertension   . Diabetes mellitus without complication   . Migraine    Past Surgical History  Procedure Laterality Date  . Cholecystectomy    . Tonsillectomy    . Tubal ligation    . Cyst excision     History reviewed. No pertinent family history. History  Substance Use Topics  . Smoking status: Never Smoker   . Smokeless tobacco: Not on file  . Alcohol Use: No   OB History   Grav Para Term Preterm Abortions TAB SAB Ect Mult Living                 Review of Systems  Constitutional: Positive for appetite change. Negative for fever and chills.  HENT: Negative for congestion.   Eyes: Positive for photophobia and visual disturbance.  Respiratory: Negative for shortness of breath.   Cardiovascular: Negative for chest pain.  Gastrointestinal: Negative for vomiting and abdominal  pain.  Genitourinary: Negative for dysuria and flank pain.  Musculoskeletal: Negative for back pain, neck pain and neck stiffness.  Skin: Negative for rash.  Neurological: Positive for dizziness and light-headedness. Negative for headaches.      Allergies  Review of patient's allergies indicates no known allergies.  Home Medications   Prior to Admission medications   Medication Sig Start Date End Date Taking? Authorizing Provider  aspirin EC 81 MG tablet Take 1 tablet (81 mg total) by mouth daily. 01/22/13   Nishant Dhungel, MD  gabapentin (NEURONTIN) 300 MG capsule Take 1 capsule (300 mg total) by mouth 3 (three) times daily. 03/21/13   Angelica Chessman, MD  glucose blood test strip Use as instructed 01/22/13   Nishant Dhungel, MD  glucose monitoring kit (FREESTYLE) monitoring kit 1 each by Does not apply route as needed for other. 01/22/13   Nishant Dhungel, MD  insulin aspart (NOVOLOG) 100 UNIT/ML injection 150 - 200 = 3 units, 201 - 250 = 5 units, 251 - 300 = 7 units 301 - 350 = 10 units, > 350, call or come to clinic 06/20/13   Angelica Chessman, MD  insulin NPH-regular Human (NOVOLIN 70/30) (70-30) 100 UNIT/ML injection Inject 30 Units into the skin 2 (two) times daily with a meal. 06/20/13   Angelica Chessman, MD  Insulin Syringes, Disposable, U-100 0.3 ML MISC Use to inject as directed by the physician 01/25/13   Angelica Chessman, MD  Lancets (ACCU-CHEK MULTICLIX) lancets Use as instructed 01/22/13   Louellen Molder, MD  lisinopril-hydrochlorothiazide (  PRINZIDE,ZESTORETIC) 20-12.5 MG per tablet Take 1 tablet by mouth daily.    Historical Provider, MD  metFORMIN (GLUCOPHAGE) 1000 MG tablet Take 1 tablet (1,000 mg total) by mouth 2 (two) times daily with a meal. 12/31/12   Carvel Getting, NP  simvastatin (ZOCOR) 10 MG tablet Take 1 tablet (10 mg total) by mouth at bedtime. 01/22/13   Nishant Dhungel, MD   BP 150/67  Pulse 90  Temp(Src) 98.5 F (36.9 C) (Oral)  Resp 18  Ht _0   (1.626 m)  Wt 220 lb 12.8 oz (100.154 kg)  BMI 37.88 kg/m2  SpO2 100%  LMP 07/28/2013 Physical Exam  Nursing note and vitals reviewed. Constitutional: She is oriented to person, place, and time. She appears well-developed and well-nourished.  HENT:  Head: Normocephalic and atraumatic.  Mild dry mucous membranes  Eyes: Conjunctivae are normal. Right eye exhibits no discharge. Left eye exhibits no discharge.  Neck: Normal range of motion. Neck supple. No tracheal deviation present.  Cardiovascular: Normal rate and regular rhythm.   Pulmonary/Chest: Effort normal and breath sounds normal.  Abdominal: Soft. She exhibits no distension. There is no tenderness. There is no guarding.  Musculoskeletal: She exhibits no edema.  Neurological: She is alert and oriented to person, place, and time. Coordination and gait normal.  5+ strength in UE and LE with f/e at major joints. Sensation to palpation intact in UE and LE. CNs 2-12 grossly intact except-in in the right eye both visual fields  EOMFI.  PERRL.   Finger nose and coordination intact bilateral.      Skin: Skin is warm. No rash noted.  Psychiatric: She has a normal mood and affect.    ED Course  Procedures (including critical care time) Labs Review Labs Reviewed  BASIC METABOLIC PANEL - Abnormal; Notable for the following:    Glucose, Bld 189 (*)    All other components within normal limits  CBG MONITORING, ED - Abnormal; Notable for the following:    Glucose-Capillary 184 (*)    All other components within normal limits  CBC    Imaging Review Mr Brain Wo Contrast  08/18/2013   CLINICAL DATA:  Headache.  Diplopia.  Dizziness.  EXAM: MRI HEAD WITHOUT CONTRAST  TECHNIQUE: Multiplanar, multiecho pulse sequences of the brain and surrounding structures were obtained without intravenous contrast.  COMPARISON:  MRI brain 06/13/2012.  FINDINGS: The diffusion-weighted images demonstrate no evidence for acute or subacute infarction. T1  marrow signal is depressed in the upper cervical spine. Midline structures are otherwise normal. Minimal periventricular white matter changes are stable. No hemorrhage or mass lesion is present. The ventricles are of normal size. No significant extra-axial fluid collection is present.  Flow is present in the major intracranial arteries. The globes and orbits are intact. The paranasal sinuses and mastoid air cells are clear.  IMPRESSION: 1. No acute intracranial abnormality. 2. Similar appearance of periventricular white matter changes adjacent to the frontal horn of the lateral ventricles bilaterally. The finding is nonspecific but can be seen in the setting of chronic microvascular ischemia, a demyelinating process such as multiple sclerosis, vasculitis, complicated migraine headaches, or as the sequelae of a prior infectious or inflammatory process.   Electronically Signed   By: Lawrence Santiago M.D.   On: 08/18/2013 18:13     EKG Interpretation None      MDM   Final diagnoses:  Headache  Hyperglycemia DM  Overall headache similar to previous migraines however it has been greater than a  year and patient does have double vision in the right visual field which is new per her. Normal gait and other posterior circulation testing unremarkable. Plan for IV fluid bolus, migraine medicines, CT head and MRI brain to look for bleeding or posterior circulation stroke.  Patient improved significantly on recheck. MRI negative for bleeding or stroke. Followup outpatient primary doctor with possible referral to neurology of headaches continue. Vision issues resolved in ED.  Results and differential diagnosis were discussed with the patient/parent/guardian. Close follow up outpatient was discussed, comfortable with the plan.   Medications  ondansetron (ZOFRAN-ODT) disintegrating tablet 8 mg (8 mg Oral Given 08/18/13 1658)  sodium chloride 0.9 % bolus 1,000 mL (1,000 mLs Intravenous New Bag/Given 08/18/13  1820)  metoCLOPramide (REGLAN) injection 10 mg (10 mg Intravenous Given 08/18/13 1822)  diphenhydrAMINE (BENADRYL) injection 25 mg (25 mg Intravenous Given 08/18/13 1821)    Filed Vitals:   08/18/13 1534 08/18/13 1839  BP: 150/67 143/80  Pulse: 90 78  Temp: 98.5 F (36.9 C) 98.8 F (37.1 C)  TempSrc: Oral   Resp: 18 16  Height: _0  (1.626 m)   Weight: 220 lb 12.8 oz (100.154 kg)   SpO2: 100% 100%         Mariea Clonts, MD 08/18/13 1950

## 2013-08-18 NOTE — Discharge Instructions (Signed)
If you were given medicines take as directed.  If you are on coumadin or contraceptives realize their levels and effectiveness is altered by many different medicines.  If you have any reaction (rash, tongues swelling, other) to the medicines stop taking and see a physician.   Take benadryl with reglan. Please follow up as directed and return to the ER or see a physician for new or worsening symptoms.  Thank you. Filed Vitals:   08/18/13 1534 08/18/13 1839  BP: 150/67 143/80  Pulse: 90 78  Temp: 98.5 F (36.9 C) 98.8 F (37.1 C)  TempSrc: Oral   Resp: 18 16  Height: 5\' 4"  (1.626 m)   Weight: 220 lb 12.8 oz (100.154 kg)   SpO2: 100% 100%    Headaches, Frequently Asked Questions MIGRAINE HEADACHES Q: What is migraine? What causes it? How can I treat it? A: Generally, migraine headaches begin as a dull ache. Then they develop into a constant, throbbing, and pulsating pain. You may experience pain at the temples. You may experience pain at the front or back of one or both sides of the head. The pain is usually accompanied by a combination of:  Nausea.  Vomiting.  Sensitivity to light and noise. Some people (about 15%) experience an aura (see below) before an attack. The cause of migraine is believed to be chemical reactions in the brain. Treatment for migraine may include over-the-counter or prescription medications. It may also include self-help techniques. These include relaxation training and biofeedback.  Q: What is an aura? A: About 15% of people with migraine get an "aura". This is a sign of neurological symptoms that occur before a migraine headache. You may see wavy or jagged lines, dots, or flashing lights. You might experience tunnel vision or blind spots in one or both eyes. The aura can include visual or auditory hallucinations (something imagined). It may include disruptions in smell (such as strange odors), taste or touch. Other symptoms include:  Numbness.  A "pins and  needles" sensation.  Difficulty in recalling or speaking the correct word. These neurological events may last as long as 60 minutes. These symptoms will fade as the headache begins. Q: What is a trigger? A: Certain physical or environmental factors can lead to or "trigger" a migraine. These include:  Foods.  Hormonal changes.  Weather.  Stress. It is important to remember that triggers are different for everyone. To help prevent migraine attacks, you need to figure out which triggers affect you. Keep a headache diary. This is a good way to track triggers. The diary will help you talk to your healthcare professional about your condition. Q: Does weather affect migraines? A: Bright sunshine, hot, humid conditions, and drastic changes in barometric pressure may lead to, or "trigger," a migraine attack in some people. But studies have shown that weather does not act as a trigger for everyone with migraines. Q: What is the link between migraine and hormones? A: Hormones start and regulate many of your body's functions. Hormones keep your body in balance within a constantly changing environment. The levels of hormones in your body are unbalanced at times. Examples are during menstruation, pregnancy, or menopause. That can lead to a migraine attack. In fact, about three quarters of all women with migraine report that their attacks are related to the menstrual cycle.  Q: Is there an increased risk of stroke for migraine sufferers? A: The likelihood of a migraine attack causing a stroke is very remote. That is not to say  that migraine sufferers cannot have a stroke associated with their migraines. In persons under age 3, the most common associated factor for stroke is migraine headache. But over the course of a person's normal life span, the occurrence of migraine headache may actually be associated with a reduced risk of dying from cerebrovascular disease due to stroke.  Q: What are acute medications  for migraine? A: Acute medications are used to treat the pain of the headache after it has started. Examples over-the-counter medications, NSAIDs, ergots, and triptans.  Q: What are the triptans? A: Triptans are the newest class of abortive medications. They are specifically targeted to treat migraine. Triptans are vasoconstrictors. They moderate some chemical reactions in the brain. The triptans work on receptors in your brain. Triptans help to restore the balance of a neurotransmitter called serotonin. Fluctuations in levels of serotonin are thought to be a main cause of migraine.  Q: Are over-the-counter medications for migraine effective? A: Over-the-counter, or "OTC," medications may be effective in relieving mild to moderate pain and associated symptoms of migraine. But you should see your caregiver before beginning any treatment regimen for migraine.  Q: What are preventive medications for migraine? A: Preventive medications for migraine are sometimes referred to as "prophylactic" treatments. They are used to reduce the frequency, severity, and length of migraine attacks. Examples of preventive medications include antiepileptic medications, antidepressants, beta-blockers, calcium channel blockers, and NSAIDs (nonsteroidal anti-inflammatory drugs). Q: Why are anticonvulsants used to treat migraine? A: During the past few years, there has been an increased interest in antiepileptic drugs for the prevention of migraine. They are sometimes referred to as "anticonvulsants". Both epilepsy and migraine may be caused by similar reactions in the brain.  Q: Why are antidepressants used to treat migraine? A: Antidepressants are typically used to treat people with depression. They may reduce migraine frequency by regulating chemical levels, such as serotonin, in the brain.  Q: What alternative therapies are used to treat migraine? A: The term "alternative therapies" is often used to describe treatments  considered outside the scope of conventional Western medicine. Examples of alternative therapy include acupuncture, acupressure, and yoga. Another common alternative treatment is herbal therapy. Some herbs are believed to relieve headache pain. Always discuss alternative therapies with your caregiver before proceeding. Some herbal products contain arsenic and other toxins. TENSION HEADACHES Q: What is a tension-type headache? What causes it? How can I treat it? A: Tension-type headaches occur randomly. They are often the result of temporary stress, anxiety, fatigue, or anger. Symptoms include soreness in your temples, a tightening band-like sensation around your head (a "vice-like" ache). Symptoms can also include a pulling feeling, pressure sensations, and contracting head and neck muscles. The headache begins in your forehead, temples, or the back of your head and neck. Treatment for tension-type headache may include over-the-counter or prescription medications. Treatment may also include self-help techniques such as relaxation training and biofeedback. CLUSTER HEADACHES Q: What is a cluster headache? What causes it? How can I treat it? A: Cluster headache gets its name because the attacks come in groups. The pain arrives with little, if any, warning. It is usually on one side of the head. A tearing or bloodshot eye and a runny nose on the same side of the headache may also accompany the pain. Cluster headaches are believed to be caused by chemical reactions in the brain. They have been described as the most severe and intense of any headache type. Treatment for cluster headache includes prescription medication and  oxygen. SINUS HEADACHES Q: What is a sinus headache? What causes it? How can I treat it? A: When a cavity in the bones of the face and skull (a sinus) becomes inflamed, the inflammation will cause localized pain. This condition is usually the result of an allergic reaction, a tumor, or an  infection. If your headache is caused by a sinus blockage, such as an infection, you will probably have a fever. An x-ray will confirm a sinus blockage. Your caregiver's treatment might include antibiotics for the infection, as well as antihistamines or decongestants.  REBOUND HEADACHES Q: What is a rebound headache? What causes it? How can I treat it? A: A pattern of taking acute headache medications too often can lead to a condition known as "rebound headache." A pattern of taking too much headache medication includes taking it more than 2 days per week or in excessive amounts. That means more than the label or a caregiver advises. With rebound headaches, your medications not only stop relieving pain, they actually begin to cause headaches. Doctors treat rebound headache by tapering the medication that is being overused. Sometimes your caregiver will gradually substitute a different type of treatment or medication. Stopping may be a challenge. Regularly overusing a medication increases the potential for serious side effects. Consult a caregiver if you regularly use headache medications more than 2 days per week or more than the label advises. ADDITIONAL QUESTIONS AND ANSWERS Q: What is biofeedback? A: Biofeedback is a self-help treatment. Biofeedback uses special equipment to monitor your body's involuntary physical responses. Biofeedback monitors:  Breathing.  Pulse.  Heart rate.  Temperature.  Muscle tension.  Brain activity. Biofeedback helps you refine and perfect your relaxation exercises. You learn to control the physical responses that are related to stress. Once the technique has been mastered, you do not need the equipment any more. Q: Are headaches hereditary? A: Four out of five (80%) of people that suffer report a family history of migraine. Scientists are not sure if this is genetic or a family predisposition. Despite the uncertainty, a child has a 50% chance of having migraine if  one parent suffers. The child has a 75% chance if both parents suffer.  Q: Can children get headaches? A: By the time they reach high school, most young people have experienced some type of headache. Many safe and effective approaches or medications can prevent a headache from occurring or stop it after it has begun.  Q: What type of doctor should I see to diagnose and treat my headache? A: Start with your primary caregiver. Discuss his or her experience and approach to headaches. Discuss methods of classification, diagnosis, and treatment. Your caregiver may decide to recommend you to a headache specialist, depending upon your symptoms or other physical conditions. Having diabetes, allergies, etc., may require a more comprehensive and inclusive approach to your headache. The National Headache Foundation will provide, upon request, a list of [facility] Center[facility] physician members in your state. Document Released: 05/14/2003 Document Revised: 05/16/2011 Document Reviewed: 10/22/2007 [facility] Patient Information 2014 [place]ExitCare, [place]LLC.

## 2013-08-18 NOTE — ED Notes (Signed)
Patient brought to hallway bed

## 2013-08-23 ENCOUNTER — Telehealth: Payer: Self-pay | Admitting: Emergency Medicine

## 2013-08-23 ENCOUNTER — Encounter: Payer: Self-pay | Admitting: Emergency Medicine

## 2013-08-23 NOTE — Telephone Encounter (Signed)
Spoke with pt regarding letter for employer. Pt is requesting general header of diagnosis and medication management. Letter done and sent to front desk for pt to pick up. Pt is aware

## 2013-09-02 ENCOUNTER — Encounter (HOSPITAL_COMMUNITY): Payer: Self-pay | Admitting: Emergency Medicine

## 2013-09-02 ENCOUNTER — Emergency Department (HOSPITAL_COMMUNITY): Payer: MEDICAID

## 2013-09-02 ENCOUNTER — Emergency Department (HOSPITAL_COMMUNITY)
Admission: EM | Admit: 2013-09-02 | Discharge: 2013-09-02 | Disposition: A | Discharge status: Home / Self Care | Payer: MEDICAID | Attending: Emergency Medicine | Admitting: Emergency Medicine

## 2013-09-02 DIAGNOSIS — Z794 Long term (current) use of insulin: Secondary | ICD-10-CM | POA: Insufficient documentation

## 2013-09-02 DIAGNOSIS — I10 Essential (primary) hypertension: Secondary | ICD-10-CM | POA: Insufficient documentation

## 2013-09-02 DIAGNOSIS — Z7982 Long term (current) use of aspirin: Secondary | ICD-10-CM | POA: Insufficient documentation

## 2013-09-02 DIAGNOSIS — S46909A Unspecified injury of unspecified muscle, fascia and tendon at shoulder and upper arm level, unspecified arm, initial encounter: Secondary | ICD-10-CM | POA: Insufficient documentation

## 2013-09-02 DIAGNOSIS — Z87828 Personal history of other (healed) physical injury and trauma: Secondary | ICD-10-CM | POA: Insufficient documentation

## 2013-09-02 DIAGNOSIS — Z791 Long term (current) use of non-steroidal anti-inflammatories (NSAID): Secondary | ICD-10-CM | POA: Insufficient documentation

## 2013-09-02 DIAGNOSIS — E119 Type 2 diabetes mellitus without complications: Secondary | ICD-10-CM | POA: Insufficient documentation

## 2013-09-02 DIAGNOSIS — M25512 Pain in left shoulder: Secondary | ICD-10-CM

## 2013-09-02 DIAGNOSIS — G43909 Migraine, unspecified, not intractable, without status migrainosus: Secondary | ICD-10-CM | POA: Insufficient documentation

## 2013-09-02 DIAGNOSIS — Y9389 Activity, other specified: Secondary | ICD-10-CM | POA: Insufficient documentation

## 2013-09-02 DIAGNOSIS — Y929 Unspecified place or not applicable: Secondary | ICD-10-CM | POA: Insufficient documentation

## 2013-09-02 DIAGNOSIS — X58XXXA Exposure to other specified factors, initial encounter: Secondary | ICD-10-CM | POA: Insufficient documentation

## 2013-09-02 DIAGNOSIS — S4980XA Other specified injuries of shoulder and upper arm, unspecified arm, initial encounter: Secondary | ICD-10-CM | POA: Insufficient documentation

## 2013-09-02 DIAGNOSIS — Z79899 Other long term (current) drug therapy: Secondary | ICD-10-CM | POA: Insufficient documentation

## 2013-09-02 MED ORDER — METHOCARBAMOL 500 MG PO TABS: 500.0000 mg | ORAL_TABLET | Freq: Two times a day (BID) | ORAL | Status: DC

## 2013-09-02 MED ORDER — HYDROCODONE-ACETAMINOPHEN 5-325 MG PO TABS
2.0000 | ORAL_TABLET | Freq: Once | ORAL | Status: AC
  Administered 2013-09-02: 2 via ORAL
  Filled 2013-09-02: qty 2

## 2013-09-02 MED ORDER — HYDROCODONE-ACETAMINOPHEN 5-325 MG PO TABS: 1.0000 | ORAL_TABLET | ORAL | Status: DC | PRN

## 2013-09-02 MED ORDER — METHOCARBAMOL 500 MG PO TABS
500.0000 mg | ORAL_TABLET | Freq: Once | ORAL | Status: AC
  Administered 2013-09-02: 500 mg via ORAL
  Filled 2013-09-02: qty 1

## 2013-09-02 MED ORDER — IBUPROFEN 400 MG PO TABS
400.0000 mg | ORAL_TABLET | Freq: Once | ORAL | Status: AC
  Administered 2013-09-02: 400 mg via ORAL
  Filled 2013-09-02: qty 1

## 2013-09-02 NOTE — ED Provider Notes (Signed)
Medical screening examination/treatment/procedure(s) were performed by non-physician practitioner and as supervising physician I was immediately available for consultation/collaboration.   EKG Interpretation None        Kristen N Ward, DO 09/02/13 2320 

## 2013-09-02 NOTE — ED Provider Notes (Signed)
CSN: [id number]     Arrival date & time 09/02/13  1657 History  This chart was scribed for non-physician practitioner Theodis Sato, PA-C working with [place], DO by Eston Mould, ED Scribe. This patient was seen in room TR07C/TR07C and the patient's care was started at 9:22 PM .   Chief Complaint  Patient presents with  . Shoulder Pain   The history is provided by the patient and medical records. No language interpreter was used.   HPI Comments: Mariah Miller is a 38 y.o. female with a hx of HTN and DM who presents to the Emergency Department complaining of acute left shoulder pain that began 3 days ago. Pt states she had surgery in the L shoulder for bone spur and rotator cuff tear in 2009.  She reports she was pushing something heavy on Friday when she heard something "pop" and she had pain immediately and now has pain in the left shoulder.  Pt has been taking ibuprofen without relief.  Pt reports nothing makes it better and movement makes it worse.  Pt also endorses paresthesias in the fingertips and her arm feels heavy.  Pt reports no initial neck pain but has gradually worked its way into the left side of the neck.  Pt denies fever, chills, history of frozen shoulder.   Past Medical History  Diagnosis Date  . Hypertension   . Diabetes mellitus without complication   . Migraine    Past Surgical History  Procedure Laterality Date  . Cholecystectomy    . Tonsillectomy    . Tubal ligation    . Cyst excision     History reviewed. No pertinent family history. History  Substance Use Topics  . Smoking status: Never Smoker   . Smokeless tobacco: Not on file  . Alcohol Use: No   OB History   Grav Para Term Preterm Abortions TAB SAB Ect Mult Living                 Review of Systems  Constitutional: Negative for fever and chills.  Gastrointestinal: Negative for nausea and vomiting.  Musculoskeletal: Positive for arthralgias and joint swelling. Negative  for back pain, neck pain and neck stiffness.  Skin: Negative for wound.  Neurological: Negative for numbness.  Hematological: Does not bruise/bleed easily.  Psychiatric/Behavioral: The patient is not nervous/anxious.   All other systems reviewed and are negative.   Allergies  Review of patient's allergies indicates no known allergies.  Home Medications   Prior to Admission medications   Medication Sig Start Date End Date Taking? Authorizing Provider  aspirin EC 81 MG tablet Take 1 tablet (81 mg total) by mouth daily. 01/22/13  Yes Nishant Dhungel, MD  gabapentin (NEURONTIN) 300 MG capsule Take 1 capsule (300 mg total) by mouth 3 (three) times daily. 03/21/13  Yes Angelica Chessman, MD  insulin aspart (NOVOLOG) 100 UNIT/ML injection Inject 3-10 Units into the skin once. 150 - 200 = 3 units, 201 - 250 = 5 units, 251 - 300 = 7 units 301 - 350 = 10 units, > 350, call or come to clinic 06/20/13  Yes Angelica Chessman, MD  insulin NPH-regular Human (NOVOLIN 70/30) (70-30) 100 UNIT/ML injection Inject 30 Units into the skin 2 (two) times daily with a meal. 06/20/13  Yes Angelica Chessman, MD  lisinopril-hydrochlorothiazide (PRINZIDE,ZESTORETIC) 20-12.5 MG per tablet Take 1 tablet by mouth daily.   Yes Historical Provider, MD  metFORMIN (GLUCOPHAGE) 1000 MG tablet Take 1 tablet (1,000 mg total) by  mouth 2 (two) times daily with a meal. 12/31/12  Yes Carvel Getting, NP  metoCLOPramide (REGLAN) 10 MG tablet Take 1 tablet (10 mg total) by mouth every 6 (six) hours. 08/18/13  Yes Mariea Clonts, MD  naproxen (NAPROSYN) 375 MG tablet Take 1 tablet (375 mg total) by mouth 2 (two) times daily. 08/18/13  Yes Mariea Clonts, MD  simvastatin (ZOCOR) 10 MG tablet Take 1 tablet (10 mg total) by mouth at bedtime. 01/22/13  Yes Nishant Dhungel, MD  glucose blood test strip Use as instructed 01/22/13   Nishant Dhungel, MD  glucose monitoring kit (FREESTYLE) monitoring kit 1 each by Does not apply route as needed for  other. 01/22/13   Nishant Dhungel, MD  HYDROcodone-acetaminophen (NORCO/VICODIN) 5-325 MG per tablet Take 1-2 tablets by mouth every 4 (four) hours as needed for moderate pain or severe pain. 09/02/13   Jamariyah Johannsen, PA-C  Insulin Syringes, Disposable, U-100 0.3 ML MISC Use to inject as directed by the physician 01/25/13   Angelica Chessman, MD  Lancets (ACCU-CHEK MULTICLIX) lancets Use as instructed 01/22/13   Nishant Dhungel, MD  methocarbamol (ROBAXIN) 500 MG tablet Take 1 tablet (500 mg total) by mouth 2 (two) times daily. 09/02/13   Regina Coppolino, PA-C   BP 136/69  Pulse 68  Temp(Src) 98.6 F (37 C) (Oral)  Resp 18  Ht $R'5\' 4"'eQ$  (1.626 m)  Wt 220 lb (99.791 kg)  BMI 37.74 kg/m2  SpO2 100%  LMP 08/22/2013  Physical Exam  Nursing note and vitals reviewed. Constitutional: She appears well-developed and well-nourished. No distress.  HENT:  Head: Normocephalic and atraumatic.  Eyes: Conjunctivae are normal.  Neck: Normal range of motion.  Cardiovascular: Normal rate, regular rhythm, normal heart sounds and intact distal pulses.   No murmur heard. Capillary refill < 3 sec  Pulmonary/Chest: Effort normal and breath sounds normal.  Musculoskeletal: She exhibits tenderness. She exhibits no edema.  ROM: full ROM of the left hand, wrist and elbow Significantly decreased ROM to the left shoulder  Neurological: She is alert. Coordination normal.  Sensation intact to dull and sharp Strength 5/5 in the left hand and elbow,  2/5 in the left shoulder due to pain  Skin: Skin is warm and dry. She is not diaphoretic.  No tenting of the skin  Psychiatric: She has a normal mood and affect.    ED Course  Procedures  DIAGNOSTIC STUDIES: Oxygen Saturation is 100% on RA, normal by my interpretation.    COORDINATION OF CARE: 9:22 PM-Discussed treatment plan which includes pain medicine, swelling and muscle relaxer along with orthopedic followup with pt at bedside and pt agreed to plan.    Labs Review Labs Reviewed - No data to display  Imaging Review Dg Shoulder Left  09/02/2013   CLINICAL DATA:  Left shoulder pain  EXAM: LEFT SHOULDER - 2+ VIEW  COMPARISON:  None.  FINDINGS: There is no evidence of fracture or dislocation. There is no evidence of arthropathy or other focal bone abnormality. Soft tissues are unremarkable.  IMPRESSION: There is no acute fracture dislocation.   Electronically Signed   By: Abelardo Diesel M.D.   On: 09/02/2013 18:29     EKG Interpretation None     MDM   Final diagnoses:  Arthralgia of shoulder region, left    Mariah Miller presents with left shoulder pain.  No fall or traumatic injury.  Patient reports paresthesias in the arm and weakness however it is difficult to assess due to  poor effort from pain.  No indication for x-ray this time. Pain managed in ED. Pt advised to follow up with orthopedics if symptoms persist for further evaluation. Patient given sling while in ED, conservative therapy recommended and discussed. Patient will be dc home & is agreeable with above plan.  I have personally reviewed patient's vitals, nursing note and any pertinent labs or imaging.  I performed an undressed physical exam.  At this time, it has been determined that no acute conditions requiring further emergency intervention. The patient/guardian have been advised of the diagnosis and plan. I reviewed all labs and imaging including any potential incidental findings. We have discussed signs and symptoms that warrant return to the ED, such as loading the shoulder, complete loss of shoulder movement.  Patient/guardian has voiced understanding and agreed to follow-up with the PCP or specialist in one week.  Vital signs are stable at discharge.   BP 136/69  Pulse 68  Temp(Src) 98.6 F (37 C) (Oral)  Resp 18  Ht $R'5\' 4"'io$  (1.626 m)  Wt 220 lb (99.791 kg)  BMI 37.74 kg/m2  SpO2 100%  LMP 08/22/2013   I personally performed the services described in this  documentation, which was scribed in my presence. The recorded information has been reviewed and is accurate.    Jarrett Soho Khai Torbert, PA-C 09/02/13 2122

## 2013-09-02 NOTE — ED Notes (Signed)
Pt in stating that on Friday while at work she pushed a heavy box and felt a pop, history of surgery on same shoulder, since that time pain has increased, pt has numbness and tingling in arm, pain worse with movement and patient with limited movement due to that, no distress noted.

## 2013-09-02 NOTE — Discharge Instructions (Signed)
1. Medications: Vicodin, arthralgia, usual home medications 2. Treatment: rest, drink plenty of fluids, take medications as prescribed, use sling as needed 3. Follow Up: Please followup with your primary doctor for discussion of your diagnoses and further evaluation after today's visit;     Arthralgia Your caregiver has diagnosed you as suffering from an arthralgia. Arthralgia means there is pain in a joint. This can come from many reasons including:  Bruising the joint which causes soreness (inflammation) in the joint.  Wear and tear on the joints which occur as we grow older (osteoarthritis).  Overusing the joint.  Various forms of arthritis.  Infections of the joint. Regardless of the cause of pain in your joint, most of these different pains respond to anti-inflammatory drugs and rest. The exception to this is when a joint is infected, and these cases are treated with antibiotics, if it is a bacterial infection. HOME CARE INSTRUCTIONS   Rest the injured area for as long as directed by your caregiver. Then slowly start using the joint as directed by your caregiver and as the pain allows. Crutches as directed may be useful if the ankles, knees or hips are involved. If the knee was splinted or casted, continue use and care as directed. If an stretchy or elastic wrapping bandage has been applied today, it should be removed and re-applied every 3 to 4 hours. It should not be applied tightly, but firmly enough to keep swelling down. Watch toes and feet for swelling, bluish discoloration, coldness, numbness or excessive pain. If any of these problems (symptoms) occur, remove the ace bandage and re-apply more loosely. If these symptoms persist, contact your caregiver or return to this location.  For the first 24 hours, keep the injured extremity elevated on pillows while lying down.  Apply ice for 15-20 minutes to the sore joint every couple hours while awake for the first half day. Then 03-04  times per day for the first 48 hours. Put the ice in a plastic bag and place a towel between the bag of ice and your skin.  Wear any splinting, casting, elastic bandage applications, or slings as instructed.  Only take over-the-counter or prescription medicines for pain, discomfort, or fever as directed by your caregiver. Do not use aspirin immediately after the injury unless instructed by your physician. Aspirin can cause increased bleeding and bruising of the tissues.  If you were given crutches, continue to use them as instructed and do not resume weight bearing on the sore joint until instructed. Persistent pain and inability to use the sore joint as directed for more than 2 to 3 days are warning signs indicating that you should see a caregiver for a follow-up visit as soon as possible. Initially, a hairline fracture (break in bone) may not be evident on X-rays. Persistent pain and swelling indicate that further evaluation, non-weight bearing or use of the joint (use of crutches or slings as instructed), or further X-rays are indicated. X-rays may sometimes not show a small fracture until a week or 10 days later. Make a follow-up appointment with your own caregiver or one to whom we have referred you. A radiologist (specialist in reading X-rays) may read your X-rays. Make sure you know how you are to obtain your X-ray results. Do not assume everything is normal if you do not hear from us. SEEK MEDICAL CARE IF: Bruising, swelling, or pain increases. SEEK IMMEDIATE MEDICAL CARE IF:   Your fingers or toes are numb or blue.  The pain  is not responding to medications and continues to stay the same or get worse.  The pain in your joint becomes severe.  You develop a fever over 102 F (38.9 C).  It becomes impossible to move or use the joint. MAKE SURE YOU:   Understand these instructions.  Will watch your condition.  Will get help right away if you are not doing well or get worse. Document  Released: 02/21/2005 Document Revised: 05/16/2011 Document Reviewed: 10/10/2007 [facility] Patient Information 2015 [place]ExitCare, [place]. This information is not intended to replace advice given to you by your health care provider. Make sure you discuss any questions you have with your health care provider.

## 2013-09-02 NOTE — ED Notes (Signed)
Sling applied for support of left shoulder.

## 2013-09-13 ENCOUNTER — Ambulatory Visit: Payer: No Typology Code available for payment source | Attending: Internal Medicine | Admitting: Internal Medicine

## 2013-09-13 ENCOUNTER — Encounter: Payer: Self-pay | Admitting: Internal Medicine

## 2013-09-13 VITALS — BP 140/80 | HR 62 | Temp 97.6°F | Resp 17 | Wt 221.8 lb

## 2013-09-13 DIAGNOSIS — M25512 Pain in left shoulder: Secondary | ICD-10-CM

## 2013-09-13 DIAGNOSIS — E139 Other specified diabetes mellitus without complications: Secondary | ICD-10-CM

## 2013-09-13 DIAGNOSIS — M25519 Pain in unspecified shoulder: Secondary | ICD-10-CM | POA: Insufficient documentation

## 2013-09-13 DIAGNOSIS — I10 Essential (primary) hypertension: Secondary | ICD-10-CM | POA: Insufficient documentation

## 2013-09-13 DIAGNOSIS — Z7982 Long term (current) use of aspirin: Secondary | ICD-10-CM | POA: Insufficient documentation

## 2013-09-13 DIAGNOSIS — Z79899 Other long term (current) drug therapy: Secondary | ICD-10-CM | POA: Insufficient documentation

## 2013-09-13 DIAGNOSIS — Z794 Long term (current) use of insulin: Secondary | ICD-10-CM | POA: Insufficient documentation

## 2013-09-13 DIAGNOSIS — E119 Type 2 diabetes mellitus without complications: Secondary | ICD-10-CM | POA: Insufficient documentation

## 2013-09-13 DIAGNOSIS — E089 Diabetes mellitus due to underlying condition without complications: Secondary | ICD-10-CM

## 2013-09-13 LAB — GLUCOSE, POCT (MANUAL RESULT ENTRY): POC GLUCOSE: 275 mg/dL — AB (ref 70–99)

## 2013-09-13 LAB — POCT GLYCOSYLATED HEMOGLOBIN (HGB A1C): HEMOGLOBIN A1C: 8.2

## 2013-09-13 MED ORDER — INSULIN ASPART 100 UNIT/ML ~~LOC~~ SOLN
10.0000 [IU] | Freq: Once | SUBCUTANEOUS | Status: AC
  Administered 2013-09-13: 10 [IU] via SUBCUTANEOUS

## 2013-09-13 MED ORDER — LISINOPRIL-HYDROCHLOROTHIAZIDE 20-12.5 MG PO TABS: 1.0000 | ORAL_TABLET | Freq: Every day | ORAL | Status: DC

## 2013-09-13 MED ORDER — METFORMIN HCL 1000 MG PO TABS: 1000.0000 mg | ORAL_TABLET | Freq: Two times a day (BID) | ORAL | Status: DC

## 2013-09-13 NOTE — Patient Instructions (Addendum)
DASH Eating Plan DASH stands for "Dietary Approaches to Stop Hypertension." The DASH eating plan is a healthy eating plan that has been shown to reduce high blood pressure (hypertension). Additional health benefits may include reducing the risk of type 2 diabetes mellitus, heart disease, and stroke. The DASH eating plan may also help with weight loss. WHAT DO I NEED TO KNOW ABOUT THE DASH EATING PLAN? For the DASH eating plan, you will follow these general guidelines:  Choose foods with a percent daily value for sodium of less than 5% (as listed on the food label).  Use salt-free seasonings or herbs instead of table salt or sea salt.  Check with your health care provider or pharmacist before using salt substitutes.  Eat lower-sodium products, often labeled as "lower sodium" or "no salt added."  Eat fresh foods.  Eat more vegetables, fruits, and low-fat dairy products.  Choose whole grains. Look for the word "whole" as the first word in the ingredient list.  Choose fish and skinless chicken or turkey more often than red meat. Limit fish, poultry, and meat to 6 oz (170 g) each day.  Limit sweets, desserts, sugars, and sugary drinks.  Choose heart-healthy fats.  Limit cheese to 1 oz (28 g) per day.  Eat more home-cooked food and less restaurant, buffet, and fast food.  Limit fried foods.  Cook foods using methods other than frying.  Limit canned vegetables. If you do use them, rinse them well to decrease the sodium.  When eating at a restaurant, ask that your food be prepared with less salt, or no salt if possible. WHAT FOODS CAN I EAT? Seek help from a dietitian for individual calorie needs. Grains Whole grain or whole wheat bread. Brown rice. Whole grain or whole wheat pasta. Quinoa, bulgur, and whole grain cereals. Low-sodium cereals. Corn or whole wheat flour tortillas. Whole grain cornbread. Whole grain crackers. Low-sodium crackers. Vegetables Fresh or frozen vegetables  (raw, steamed, roasted, or grilled). Low-sodium or reduced-sodium tomato and vegetable juices. Low-sodium or reduced-sodium tomato sauce and paste. Low-sodium or reduced-sodium canned vegetables.  Fruits All fresh, canned (in natural juice), or frozen fruits. Meat and Other Protein Products Ground beef (85% or leaner), grass-fed beef, or beef trimmed of fat. Skinless chicken or turkey. Ground chicken or turkey. Pork trimmed of fat. All fish and seafood. Eggs. Dried beans, peas, or lentils. Unsalted nuts and seeds. Unsalted canned beans. Dairy Low-fat dairy products, such as skim or 1% milk, 2% or reduced-fat cheeses, low-fat ricotta or cottage cheese, or plain low-fat yogurt. Low-sodium or reduced-sodium cheeses. Fats and Oils Tub margarines without trans fats. Light or reduced-fat mayonnaise and salad dressings (reduced sodium). Avocado. Safflower, olive, or canola oils. Natural peanut or almond butter. Other Unsalted popcorn and pretzels. The items listed above may not be a complete list of recommended foods or beverages. Contact your dietitian for more options. WHAT FOODS ARE NOT RECOMMENDED? Grains White bread. White pasta. White rice. Refined cornbread. Bagels and croissants. Crackers that contain trans fat. Vegetables Creamed or fried vegetables. Vegetables in a cheese sauce. Regular canned vegetables. Regular canned tomato sauce and paste. Regular tomato and vegetable juices. Fruits Dried fruits. Canned fruit in light or heavy syrup. Fruit juice. Meat and Other Protein Products Fatty cuts of meat. Ribs, chicken wings, bacon, sausage, bologna, salami, chitterlings, fatback, hot dogs, bratwurst, and packaged luncheon meats. Salted nuts and seeds. Canned beans with salt. Dairy Whole or 2% milk, cream, half-and-half, and cream cheese. Whole-fat or sweetened yogurt. Full-fat   cheeses or blue cheese. Nondairy creamers and whipped toppings. Processed cheese, cheese spreads, or cheese  curds. Condiments Onion and garlic salt, seasoned salt, table salt, and sea salt. Canned and packaged gravies. Worcestershire sauce. Tartar sauce. Barbecue sauce. Teriyaki sauce. Soy sauce, including reduced sodium. Steak sauce. Fish sauce. Oyster sauce. Cocktail sauce. Horseradish. Ketchup and mustard. Meat flavorings and tenderizers. Bouillon cubes. Hot sauce. Tabasco sauce. Marinades. Taco seasonings. Relishes. Fats and Oils Butter, stick margarine, lard, shortening, ghee, and bacon fat. Coconut, palm kernel, or palm oils. Regular salad dressings. Other Pickles and olives. Salted popcorn and pretzels. The items listed above may not be a complete list of foods and beverages to avoid. Contact your dietitian for more information. WHERE CAN I FIND MORE INFORMATION? National Heart, [employer], [employer]: www.nhlbi.nih.gov/health/health-topics/topics/dash/ Document Released: 02/10/2011 Document Revised: 02/26/2013 Document Reviewed: 12/26/2012 ExitCare Patient Information 2015 [employer]. This information is not intended to replace advice given to you by your health care provider. Make sure you discuss any questions you have with your health care provider. Diabetes Mellitus and Food It is important for you to manage your blood sugar (glucose) level. Your blood glucose level can be greatly affected by what you eat. Eating healthier foods in the appropriate amounts throughout the day at about the same time each day will help you control your blood glucose level. It can also help slow or prevent worsening of your diabetes mellitus. Healthy eating may even help you improve the level of your blood pressure and reach or maintain a healthy weight.  HOW CAN FOOD AFFECT ME? Carbohydrates Carbohydrates affect your blood glucose level more than any other type of food. Your dietitian will help you determine how many carbohydrates to eat at each meal and teach you how to count carbohydrates. Counting  carbohydrates is important to keep your blood glucose at a healthy level, especially if you are using insulin or taking certain medicines for diabetes mellitus. Alcohol Alcohol can cause sudden decreases in blood glucose (hypoglycemia), especially if you use insulin or take certain medicines for diabetes mellitus. Hypoglycemia can be a life-threatening condition. Symptoms of hypoglycemia (sleepiness, dizziness, and disorientation) are similar to symptoms of having too much alcohol.  If your health care provider has given you approval to drink alcohol, do so in moderation and use the following guidelines:  Women should not have more than one drink per day, and men should not have more than two drinks per day. One drink is equal to:  12 oz of beer.  5 oz of wine.  1 oz of hard liquor.  Do not drink on an empty stomach.  Keep yourself hydrated. Have water, diet soda, or unsweetened iced tea.  Regular soda, juice, and other mixers might contain a lot of carbohydrates and should be counted. WHAT FOODS ARE NOT RECOMMENDED? As you make food choices, it is important to remember that all foods are not the same. Some foods have fewer nutrients per serving than other foods, even though they might have the same number of calories or carbohydrates. It is difficult to get your body what it needs when you eat foods with fewer nutrients. Examples of foods that you should avoid that are high in calories and carbohydrates but low in nutrients include:  Trans fats (most processed foods list trans fats on the Nutrition Facts label).  Regular soda.  Juice.  Candy.  Sweets, such as cake, pie, doughnuts, and cookies.  Fried foods. WHAT FOODS CAN I EAT? Have nutrient-rich foods,   which will nourish your body and keep you healthy. The food you should eat also will depend on several factors, including:  The calories you need.  The medicines you take.  Your weight.  Your blood glucose level.  Your  blood pressure level.  Your cholesterol level. You also should eat a variety of foods, including:  Protein, such as meat, poultry, fish, tofu, nuts, and seeds (lean animal proteins are best).  Fruits.  Vegetables.  Dairy products, such as milk, cheese, and yogurt (low fat is best).  Breads, grains, pasta, cereal, rice, and beans.  Fats such as olive oil, trans fat-free margarine, canola oil, avocado, and olives. DOES EVERYONE WITH DIABETES MELLITUS HAVE THE SAME MEAL PLAN? Because every person with diabetes mellitus is different, there is not one meal plan that works for everyone. It is very important that you meet with a dietitian who will help you create a meal plan that is just right for you. Document Released: 11/18/2004 Document Revised: 02/26/2013 Document Reviewed: 01/18/2013 ExitCare Patient Information 2015 [employer]. This information is not intended to replace advice given to you by your health care provider. Make sure you discuss any questions you have with your health care provider.  

## 2013-09-13 NOTE — Progress Notes (Signed)
MRN: [id number] Name: Mariah Miller  Sex: female Age: 38 y.o. DOB: November 20, 1975  Allergies: Review of patient's allergies indicates no known allergies.  Chief Complaint  Patient presents with  . Diabetes    HPI: Patient is 38 y.o. female who has history of diabetes hypertension comes today for followup, she denies any hypoglycemic symptoms, she is on NovoLog 70/30 30 units twice a day and insulin sliding scale, and is taking metformin 1 g twice a day, today her A1c is 8.2% which has slightly improved, she also has history of chronic left shoulder pain, as per patient she is going to schedule appointment with her sports medicine Dr. she is taking Neurontin, today's blood pressure is borderline elevated, repeat manual blood pressure is 140/80, she reported to have right leg swelling, denies any orthopnea PND.  Past Medical History  Diagnosis Date  . Hypertension   . Diabetes mellitus without complication   . Migraine     Past Surgical History  Procedure Laterality Date  . Cholecystectomy    . Tonsillectomy    . Tubal ligation    . Cyst excision        Medication List       This list is accurate as of: 09/13/13  5:14 PM.  Always use your most recent med list.               accu-chek multiclix lancets  Use as instructed     aspirin EC 81 MG tablet  Take 1 tablet (81 mg total) by mouth daily.     gabapentin 300 MG capsule  Commonly known as:  NEURONTIN  Take 1 capsule (300 mg total) by mouth 3 (three) times daily.     glucose blood test strip  Use as instructed     glucose monitoring kit monitoring kit  1 each by Does not apply route as needed for other.     HYDROcodone-acetaminophen 5-325 MG per tablet  Commonly known as:  NORCO/VICODIN  Take 1-2 tablets by mouth every 4 (four) hours as needed for moderate pain or severe pain.     insulin aspart 100 UNIT/ML injection  Commonly known as:  novoLOG  Inject 3-10 Units into the skin once. 150 - 200 = 3  units, 201 - 250 = 5 units, 251 - 300 = 7 units 301 - 350 = 10 units, > 350, call or come to clinic     insulin NPH-regular Human (70-30) 100 UNIT/ML injection  Commonly known as:  NOVOLIN 70/30  Inject 30 Units into the skin 2 (two) times daily with a meal.     Insulin Syringes (Disposable) U-100 0.3 ML Misc  Use to inject as directed by the physician     lisinopril-hydrochlorothiazide 20-12.5 MG per tablet  Commonly known as:  PRINZIDE,ZESTORETIC  Take 1 tablet by mouth daily.     metFORMIN 1000 MG tablet  Commonly known as:  GLUCOPHAGE  Take 1 tablet (1,000 mg total) by mouth 2 (two) times daily with a meal.     methocarbamol 500 MG tablet  Commonly known as:  ROBAXIN  Take 1 tablet (500 mg total) by mouth 2 (two) times daily.     metoCLOPramide 10 MG tablet  Commonly known as:  REGLAN  Take 1 tablet (10 mg total) by mouth every 6 (six) hours.     naproxen 375 MG tablet  Commonly known as:  NAPROSYN  Take 1 tablet (375 mg total) by mouth 2 (two) times daily.  simvastatin 10 MG tablet  Commonly known as:  ZOCOR  Take 1 tablet (10 mg total) by mouth at bedtime.        Meds ordered this encounter  Medications  . insulin aspart (novoLOG) injection 10 Units    Sig:     Per office protocol  . metFORMIN (GLUCOPHAGE) 1000 MG tablet    Sig: Take 1 tablet (1,000 mg total) by mouth 2 (two) times daily with a meal.    Dispense:  60 tablet    Refill:  2    Order Specific Question:  Supervising Provider    Answer:  Jake Michaelis, DAVID C [[id number]]    There is no immunization history for the selected administration types on file for this patient.  History reviewed. No pertinent family history.  History  Substance Use Topics  . Smoking status: Never Smoker   . Smokeless tobacco: Not on file  . Alcohol Use: No    Review of Systems   As noted in HPI  Filed Vitals:   09/13/13 1714  BP: 140/80  Pulse:   Temp:   Resp:     Physical Exam  Physical Exam    Constitutional: No distress.  Eyes: EOM are normal. Pupils are equal, round, and reactive to light.  Cardiovascular: Normal rate and regular rhythm.   Pulmonary/Chest: Breath sounds normal. No respiratory distress. She has no wheezes. She has no rales.  Musculoskeletal:  No leg swelling or edema, no calf  tenderness, 2+ dorsalis pedis pulse    CBC    Component Value Date/Time   WBC 7.6 08/18/2013 1659   RBC 4.19 08/18/2013 1659   HGB 12.1 08/18/2013 1659   HCT 36.3 08/18/2013 1659   PLT 351 08/18/2013 1659   MCV 86.6 08/18/2013 1659   LYMPHSABS 2.8 04/08/2013 1659   MONOABS 0.9 04/08/2013 1659   EOSABS 0.1 04/08/2013 1659   BASOSABS 0.0 04/08/2013 1659    CMP     Component Value Date/Time   NA 139 08/18/2013 1659   K 4.0 08/18/2013 1659   CL 101 08/18/2013 1659   CO2 23 08/18/2013 1659   GLUCOSE 189* 08/18/2013 1659   BUN 12 08/18/2013 1659   CREATININE 0.80 08/18/2013 1659   CALCIUM 9.6 08/18/2013 1659   PROT 7.5 04/08/2013 1659   ALBUMIN 3.4* 04/08/2013 1659   AST 17 04/08/2013 1659   ALT 13 04/08/2013 1659   ALKPHOS 56 04/08/2013 1659   BILITOT <0.2* 04/08/2013 1659   GFRNONAA >90 08/18/2013 1659   GFRAA >90 08/18/2013 1659    Lab Results  Component Value Date/Time   CHOL 210* 12/31/2012 11:51 AM    No components found with this basename: hga1c    Lab Results  Component Value Date/Time   AST 17 04/08/2013  4:59 PM    Assessment and Plan  Diabetes mellitus due to underlying condition without complications - Plan:  Results for orders placed in visit on 09/13/13  GLUCOSE, POCT (MANUAL RESULT ENTRY)      Result Value Ref Range   POC Glucose 275 (*) 70 - 99 mg/dl  POCT GLYCOSYLATED HEMOGLOBIN (HGB A1C)      Result Value Ref Range   Hemoglobin A1C 8.2     I advised patient to increase the dose of NovoLog to 32 units twice a day and then she can gradually increased to 33 units, her fasting sugar is still higher than 130 milligrams deciliter, continue with metformin as well as insulin  sliding scale, advised for diabetes  meal planning. , insulin aspart (novoLOG) injection 10 Units, metFORMIN (GLUCOPHAGE) 1000 MG tablet  Essential hypertension, benign Borderline elevated blood pressure, advise for DASH diet continue with lisinopril/hydrochlorothiazide.  Shoulder pain, left Patient is on Neurontin she will follow up with sports medicine.    Return in about 3 months (around 12/14/2013) for diabetes, hypertension.  Lorayne Marek, MD

## 2013-09-13 NOTE — Progress Notes (Signed)
Patient here for follow up on her DM Complains of left shoulder pain and right foot has  Been swollen as well as the right leg

## 2013-09-18 ENCOUNTER — Emergency Department (HOSPITAL_COMMUNITY)
Admission: EM | Admit: 2013-09-18 | Discharge: 2013-09-18 | Disposition: A | Discharge status: Home / Self Care | Payer: MEDICAID | Attending: Emergency Medicine | Admitting: Emergency Medicine

## 2013-09-18 ENCOUNTER — Encounter (HOSPITAL_COMMUNITY): Payer: Self-pay | Admitting: Emergency Medicine

## 2013-09-18 DIAGNOSIS — I10 Essential (primary) hypertension: Secondary | ICD-10-CM | POA: Insufficient documentation

## 2013-09-18 DIAGNOSIS — E119 Type 2 diabetes mellitus without complications: Secondary | ICD-10-CM | POA: Insufficient documentation

## 2013-09-18 DIAGNOSIS — Z79899 Other long term (current) drug therapy: Secondary | ICD-10-CM | POA: Insufficient documentation

## 2013-09-18 DIAGNOSIS — Z7982 Long term (current) use of aspirin: Secondary | ICD-10-CM | POA: Insufficient documentation

## 2013-09-18 DIAGNOSIS — Z794 Long term (current) use of insulin: Secondary | ICD-10-CM | POA: Insufficient documentation

## 2013-09-18 DIAGNOSIS — G43909 Migraine, unspecified, not intractable, without status migrainosus: Secondary | ICD-10-CM

## 2013-09-18 LAB — CBG MONITORING, ED: GLUCOSE-CAPILLARY: 252 mg/dL — AB (ref 70–99)

## 2013-09-18 MED ORDER — SODIUM CHLORIDE 0.9 % IV BOLUS (SEPSIS)
1000.0000 mL | Freq: Once | INTRAVENOUS | Status: AC
  Administered 2013-09-18: 1000 mL via INTRAVENOUS

## 2013-09-18 MED ORDER — METOCLOPRAMIDE HCL 5 MG/ML IJ SOLN
10.0000 mg | Freq: Once | INTRAMUSCULAR | Status: AC
  Administered 2013-09-18: 10 mg via INTRAVENOUS
  Filled 2013-09-18: qty 2

## 2013-09-18 MED ORDER — SUMATRIPTAN SUCCINATE 25 MG PO TABS: 50.0000 mg | ORAL_TABLET | ORAL | Status: DC | PRN

## 2013-09-18 MED ORDER — NAPROXEN 500 MG PO TABS: 500.0000 mg | ORAL_TABLET | Freq: Two times a day (BID) | ORAL | Status: DC

## 2013-09-18 MED ORDER — DIPHENHYDRAMINE HCL 50 MG/ML IJ SOLN
25.0000 mg | Freq: Once | INTRAMUSCULAR | Status: AC
  Administered 2013-09-18: 25 mg via INTRAVENOUS
  Filled 2013-09-18: qty 1

## 2013-09-18 MED ORDER — KETOROLAC TROMETHAMINE 30 MG/ML IJ SOLN
30.0000 mg | Freq: Once | INTRAMUSCULAR | Status: AC
  Administered 2013-09-18: 30 mg via INTRAVENOUS
  Filled 2013-09-18: qty 1

## 2013-09-18 NOTE — ED Provider Notes (Signed)
CSN: [id number]     Arrival date & time 09/18/13  0049 History   First MD Initiated Contact with Patient 09/18/13 0220     Chief Complaint  Patient presents with  . Numbness  . Headache     (Consider location/radiation/quality/duration/timing/severity/associated sxs/prior Treatment) HPI Comments: 38 year old female with a history of headaches for the last year. She has had multiple evaluations for her headaches including an MRI which was performed last month and showedoccupying lesions. She reports having intermittent headaches which are becoming more frequent and this one has been present for several days. Over the last 2 days the headache has worsened and then constant throbbing and bitemporal. She has associated photophobia and mild nausea. She also endorses having intermittent blurred vision with the headaches. These symptoms resolved and the headache goes away. She has had no medications prior to arrival. She states that she works third shift and went to her job tonight stating that her headache was bad, her leg started to feel weak and she had to sit down. She states that her left side was weaker than the right side causing difficulty walking.Marland Kitchen  of note prior medical record entries show that the patient had similar symptoms though not exactly the same in the past, she reports having intermittent seizure-like activity over the last couple of months, this evening she states that when she was at home laying on the couch she felt like she could hear people talking around her but could not open her eyes and could not talk. Other people around her stated that they saw some shaking-like activity, this resolved spontaneously and has not recurred later in the day and this evening. She has not had evaluation for these symptoms. She denies fevers chills  vomiting cough shortness of breath chest pain back pain abdominal pain rash and swelling dysuria or diarrhea.  Patient is a 38 y.o. female presenting with  headaches. The history is provided by the patient and medical records.  Headache   Past Medical History  Diagnosis Date  . Hypertension   . Diabetes mellitus without complication   . Migraine    Past Surgical History  Procedure Laterality Date  . Cholecystectomy    . Tonsillectomy    . Tubal ligation    . Cyst excision     History reviewed. No pertinent family history. History  Substance Use Topics  . Smoking status: Never Smoker   . Smokeless tobacco: Not on file  . Alcohol Use: No   OB History   Grav Para Term Preterm Abortions TAB SAB Ect Mult Living                 Review of Systems  Neurological: Positive for headaches.  All other systems reviewed and are negative.     Allergies  Review of patient's allergies indicates no known allergies.  Home Medications   Prior to Admission medications   Medication Sig Start Date End Date Taking? Authorizing Provider  albuterol (PROVENTIL HFA;VENTOLIN HFA) 108 (90 BASE) MCG/ACT inhaler Inhale 2 puffs into the lungs every 6 (six) hours as needed for wheezing or shortness of breath.   Yes Historical Provider, MD  aspirin EC 81 MG tablet Take 1 tablet (81 mg total) by mouth daily. 01/22/13  Yes Nishant Dhungel, MD  gabapentin (NEURONTIN) 300 MG capsule Take 1 capsule (300 mg total) by mouth 3 (three) times daily. 03/21/13  Yes Jeanann Lewandowsky, MD  HYDROcodone-acetaminophen (NORCO/VICODIN) 5-325 MG per tablet Take 1-2 tablets by mouth every 4 (  four) hours as needed for moderate pain or severe pain. 09/02/13  Yes Hannah Muthersbaugh, PA-C  insulin aspart (NOVOLOG) 100 UNIT/ML injection Inject 3-10 Units into the skin 3 (three) times daily as needed for high blood sugar. 150 - 200 = 3 units, 201 - 250 = 5 units, 251 - 300 = 7 units 301 - 350 = 10 units, > 350, call or come to clinic 06/20/13  Yes Jeanann Lewandowskylugbemiga Jegede, MD  Insulin NPH Isophane & Regular (NOVOLIN 70/30 [place]) Inject 32 Units into the skin 2 (two) times daily.   Yes Historical  Provider, MD  lisinopril-hydrochlorothiazide (PRINZIDE,ZESTORETIC) 20-12.5 MG per tablet Take 1 tablet by mouth daily. 09/13/13  Yes Doris Cheadleeepak Advani, MD  metFORMIN (GLUCOPHAGE) 1000 MG tablet Take 1 tablet (1,000 mg total) by mouth 2 (two) times daily with a meal. 09/13/13  Yes Deepak Advani, MD  methocarbamol (ROBAXIN) 500 MG tablet Take 1 tablet (500 mg total) by mouth 2 (two) times daily. 09/02/13  Yes Hannah Muthersbaugh, PA-C  simvastatin (ZOCOR) 10 MG tablet Take 1 tablet (10 mg total) by mouth at bedtime. 01/22/13  Yes Nishant Dhungel, MD  naproxen (NAPROSYN) 500 MG tablet Take 1 tablet (500 mg total) by mouth 2 (two) times daily with a meal. 09/18/13   Vida RollerBrian D Tracie Dore, MD  SUMAtriptan (IMITREX) 25 MG tablet Take 2 tablets (50 mg total) by mouth every 2 (two) hours as needed for migraine (ongoing headache). Maximum daily dose 200mg  09/18/13   Vida RollerBrian D Myia Bergh, MD   BP 151/70  Pulse 71  Temp(Src) 98.3 F (36.8 C) (Oral)  Resp 11  Wt 221 lb (100.245 kg)  SpO2 100%  LMP 09/16/2013 Physical Exam  Nursing note and vitals reviewed. Constitutional: She appears well-developed and well-nourished. No distress.  HENT:  Head: Normocephalic and atraumatic.  Mouth/Throat: Oropharynx is clear and moist. No oropharyngeal exudate.  No tenderness over the temporal artery bilaterally, and no TMJ, clear oropharynx and moist mucous membranes  Eyes: Conjunctivae and EOM are normal. Pupils are equal, round, and reactive to light. Right eye exhibits no discharge. Left eye exhibits no discharge. No scleral icterus.  Neck: Normal range of motion. Neck supple. No JVD present. No thyromegaly present.  Cardiovascular: Normal rate, regular rhythm, normal heart sounds and intact distal pulses.  Exam reveals no gallop and no friction rub.   No murmur heard. Pulmonary/Chest: Effort normal and breath sounds normal. No respiratory distress. She has no wheezes. She has no rales.  Abdominal: Soft. Bowel sounds are normal. She  exhibits no distension and no mass. There is no tenderness.  Musculoskeletal: Normal range of motion. She exhibits no edema and no tenderness.  Lymphadenopathy:    She has no cervical adenopathy.  Neurological: She is alert. Coordination normal.  Speech is clear, cranial nerves III through XII are intact, memory is intact, strength is normal in all 4 extremities including grips, sensation is intact to light touch and pinprick in all 4 extremities. Coordination as tested by finger-nose-finger is normal, no limb ataxia. Normal gait, normal reflexes at the patellar tendons bilaterally  Skin: Skin is warm and dry. No rash noted. No erythema.  Psychiatric: She has a normal mood and affect. Her behavior is normal.    ED Course  Procedures (including critical care time) Labs Review Labs Reviewed  CBG MONITORING, ED - Abnormal; Notable for the following:    Glucose-Capillary 252 (*)    All other components within normal limits    Imaging Review No results found.  ED ECG REPORT  I personally interpreted this EKG   Date: 09/18/2013   Rate: 70  Rhythm: normal sinus rhythm  QRS Axis: normal  Intervals: normal  ST/T Wave abnormalities: normal  Conduction Disutrbances:none  Narrative Interpretation:   Old EKG Reviewed: none available   MDM   Final diagnoses:  Migraine without status migrainosus, not intractable, unspecified migraine type    The patient is well-appearing, there is no focality to her neurologic exam, she is not having any seizures and is very clearly alert and oriented. She has normal strength in all 4 extremities and is able to follow my commands without difficulty, vital signs normal, we'll treat headache, assumed complicated migraine.  Improved after medications as below, stable for discharge   Meds given in ED:  Medications  ketorolac (TORADOL) 30 MG/ML injection 30 mg (30 mg Intravenous Given 09/18/13 0341)  metoCLOPramide (REGLAN) injection 10 mg (10 mg  Intravenous Given 09/18/13 0341)  diphenhydrAMINE (BENADRYL) injection 25 mg (25 mg Intravenous Given 09/18/13 0341)  sodium chloride 0.9 % bolus 1,000 mL (1,000 mLs Intravenous New Bag/Given 09/18/13 0341)    New Prescriptions   NAPROXEN (NAPROSYN) 500 MG TABLET    Take 1 tablet (500 mg total) by mouth 2 (two) times daily with a meal.   SUMATRIPTAN (IMITREX) 25 MG TABLET    Take 2 tablets (50 mg total) by mouth every 2 (two) hours as needed for migraine (ongoing headache). Maximum daily dose 200mg        Vida Roller, MD 09/18/13 [phone]

## 2013-09-18 NOTE — ED Notes (Signed)
CBG-[id number]. Notified triage RN

## 2013-09-18 NOTE — ED Notes (Signed)
LAst known well at 10 am on 09/17/13- pt works third shift, woke up at 2 pm this afternoon with a headache and left leg numbness and tingling. Went back to sleep and went to work this evening and she states, "I could not walk well due to left leg numbness" she staes, "I had an absent seizure at 7 pm, "my eyes rolled back into my head and my head fell back. I could not move I could not say anything"  No facial droop, no slurred speech, left arm grip weaker than right, states her sensation in the left leg is different as well.

## 2013-10-22 ENCOUNTER — Ambulatory Visit: Payer: No Typology Code available for payment source | Admitting: Sports Medicine

## 2013-12-20 ENCOUNTER — Emergency Department (HOSPITAL_COMMUNITY): Payer: No Typology Code available for payment source

## 2013-12-20 ENCOUNTER — Emergency Department (HOSPITAL_COMMUNITY)
Admission: EM | Admit: 2013-12-20 | Discharge: 2013-12-21 | Disposition: A | Discharge status: Home / Self Care | Payer: No Typology Code available for payment source | Attending: Emergency Medicine | Admitting: Emergency Medicine

## 2013-12-20 ENCOUNTER — Encounter (HOSPITAL_COMMUNITY): Payer: Self-pay | Admitting: Emergency Medicine

## 2013-12-20 DIAGNOSIS — J029 Acute pharyngitis, unspecified: Secondary | ICD-10-CM | POA: Insufficient documentation

## 2013-12-20 DIAGNOSIS — R05 Cough: Secondary | ICD-10-CM | POA: Insufficient documentation

## 2013-12-20 DIAGNOSIS — R0602 Shortness of breath: Secondary | ICD-10-CM | POA: Insufficient documentation

## 2013-12-20 DIAGNOSIS — G43909 Migraine, unspecified, not intractable, without status migrainosus: Secondary | ICD-10-CM | POA: Insufficient documentation

## 2013-12-20 DIAGNOSIS — Z7982 Long term (current) use of aspirin: Secondary | ICD-10-CM | POA: Insufficient documentation

## 2013-12-20 DIAGNOSIS — I10 Essential (primary) hypertension: Secondary | ICD-10-CM | POA: Insufficient documentation

## 2013-12-20 DIAGNOSIS — Z794 Long term (current) use of insulin: Secondary | ICD-10-CM | POA: Insufficient documentation

## 2013-12-20 DIAGNOSIS — E119 Type 2 diabetes mellitus without complications: Secondary | ICD-10-CM | POA: Insufficient documentation

## 2013-12-20 DIAGNOSIS — Z79899 Other long term (current) drug therapy: Secondary | ICD-10-CM | POA: Insufficient documentation

## 2013-12-20 DIAGNOSIS — R059 Cough, unspecified: Secondary | ICD-10-CM

## 2013-12-20 MED ORDER — AZITHROMYCIN 250 MG PO TABS: 250.0000 mg | ORAL_TABLET | Freq: Every day | ORAL | Status: DC

## 2013-12-20 MED ORDER — ALBUTEROL SULFATE HFA 108 (90 BASE) MCG/ACT IN AERS
2.0000 | INHALATION_SPRAY | RESPIRATORY_TRACT | Status: DC | PRN
  Administered 2013-12-21: 2 via RESPIRATORY_TRACT
  Filled 2013-12-20: qty 6.7

## 2013-12-20 NOTE — Discharge Instructions (Signed)
Cough, Adult  A cough is a reflex that helps clear your throat and airways. It can help heal the body or may be a reaction to an irritated airway. A cough may only last 2 or 3 weeks (acute) or may last more than 8 weeks (chronic).  CAUSES Acute cough:  Viral or bacterial infections. Chronic cough:  Infections.  Allergies.  Asthma.  Post-nasal drip.  Smoking.  Heartburn or acid reflux.  Some medicines.  Chronic lung problems (COPD).  Cancer. SYMPTOMS   Cough.  Fever.  Chest pain.  Increased breathing rate.  High-pitched whistling sound when breathing (wheezing).  Colored mucus that you cough up (sputum). TREATMENT   A bacterial cough may be treated with antibiotic medicine.  A viral cough must run its course and will not respond to antibiotics.  Your caregiver may recommend other treatments if you have a chronic cough. HOME CARE INSTRUCTIONS   Only take over-the-counter or prescription medicines for pain, discomfort, or fever as directed by your caregiver. Use cough suppressants only as directed by your caregiver.  Use a cold steam vaporizer or humidifier in your bedroom or home to help loosen secretions.  Sleep in a semi-upright position if your cough is worse at night.  Rest as needed.  Stop smoking if you smoke. SEEK IMMEDIATE MEDICAL CARE IF:   You have pus in your sputum.  Your cough starts to worsen.  You cannot control your cough with suppressants and are losing sleep.  You begin coughing up blood.  You have difficulty breathing.  You develop pain which is getting worse or is uncontrolled with medicine.  You have a fever. MAKE SURE YOU:   Understand these instructions.  Will watch your condition.  Will get help right away if you are not doing well or get worse. Document Released: 08/20/2010 Document Revised: 05/16/2011 Document Reviewed: 08/20/2010 [employer] Patient Information 2015 [employer]. This information is not intended  to replace advice given to you by your health care provider. Make sure you discuss any questions you have with your health care provider.  

## 2013-12-20 NOTE — ED Notes (Signed)
Pt c/o persistent cough x's 3 weeks.  St's has taken OTC cough meds without relief.

## 2013-12-20 NOTE — ED Provider Notes (Signed)
CSN: [id number]     Arrival date & time 12/20/13  2227 History  This chart was scribed for non-physician practitioner working with Gilda Creasehristopher J. Pollina, * by Angelene GiovanniEmmanuella Mensah, ED Scribe. The patient was seen in room TR05C/TR05C and the patient's care was started at 10:51 PM     Chief Complaint  Patient presents with  . Cough    The history is provided by the patient. No language interpreter was used.   HPI Comments: Junius FinnerLatoria M Myung is a 38 y.o. female who presents to the Emergency Department complaining of a persistent worsening cough onset 3 weeks ago. She reports associated sore throat, mucous thickness and dyspnea. She has tried multiple OTC cough medicine with no relief. She states that she was diagnosed with asthma years ago. She denies fever. Patient is a non smoker with not no allergies to medications.   Past Medical History  Diagnosis Date  . Hypertension   . Diabetes mellitus without complication   . Migraine    Past Surgical History  Procedure Laterality Date  . Cholecystectomy    . Tonsillectomy    . Tubal ligation    . Cyst excision     No family history on file. History  Substance Use Topics  . Smoking status: Never Smoker   . Smokeless tobacco: Not on file  . Alcohol Use: No   OB History   Grav Para Term Preterm Abortions TAB SAB Ect Mult Living                 Review of Systems  Constitutional: Negative for fever and chills.  HENT: Positive for sore throat.   Respiratory: Positive for cough and shortness of breath.       Allergies  Review of patient's allergies indicates no known allergies.  Home Medications   Prior to Admission medications   Medication Sig Start Date End Date Taking? Authorizing Provider  aspirin EC 81 MG tablet Take 1 tablet (81 mg total) by mouth daily. 01/22/13  Yes Nishant Dhungel, MD  insulin aspart (NOVOLOG) 100 UNIT/ML injection Inject 3-10 Units into the skin 3 (three) times daily as needed for high blood sugar.  150 - 200 = 3 units, 201 - 250 = 5 units, 251 - 300 = 7 units 301 - 350 = 10 units, > 350, call or come to clinic 06/20/13  Yes Olugbemiga E Hyman HopesJegede, MD  Insulin NPH Isophane & Regular (NOVOLIN 70/30 [place]) Inject 32 Units into the skin 2 (two) times daily.   Yes Historical Provider, MD  lisinopril-hydrochlorothiazide (PRINZIDE,ZESTORETIC) 20-12.5 MG per tablet Take 1 tablet by mouth daily. 09/13/13  Yes Doris Cheadleeepak Advani, MD  metFORMIN (GLUCOPHAGE) 1000 MG tablet Take 1 tablet (1,000 mg total) by mouth 2 (two) times daily with a meal. 09/13/13  Yes Deepak Advani, MD  simvastatin (ZOCOR) 10 MG tablet Take 1 tablet (10 mg total) by mouth at bedtime. 01/22/13  Yes Nishant Dhungel, MD   BP 131/75  Pulse 87  Temp(Src) 98.5 F (36.9 C) (Oral)  Resp 14  Ht 5\' 4"  (1.626 m)  Wt 211 lb (95.709 kg)  BMI 36.20 kg/m2  SpO2 99%  LMP 12/06/2013 Physical Exam  Nursing note and vitals reviewed. Constitutional: She is oriented to person, place, and time. She appears well-developed and well-nourished. No distress.  HENT:  Head: Normocephalic and atraumatic.  Eyes: Conjunctivae and EOM are normal. Pupils are equal, round, and reactive to light.  Neck: Normal range of motion. Neck supple. No tracheal deviation present.  Cardiovascular: Normal rate and regular rhythm.  Exam reveals no gallop and no friction rub.   No murmur heard. Pulmonary/Chest: Effort normal and breath sounds normal. No respiratory distress. She has no wheezes. She has no rales. She exhibits no tenderness.  Clear to auscultation bilaterally  Abdominal: Soft. She exhibits no distension and no mass. There is no tenderness. There is no rebound and no guarding.  Musculoskeletal: Normal range of motion. She exhibits no edema and no tenderness.  Neurological: She is alert and oriented to person, place, and time.  Skin: Skin is warm and dry.  Psychiatric: She has a normal mood and affect. Her behavior is normal. Judgment and thought content normal.     ED Course  Procedures (including critical care time) DIAGNOSTIC STUDIES: Oxygen Saturation is 99% on RA, normal by my interpretation.    COORDINATION OF CARE: 10:57 PM- Pt advised of plan for treatment and pt agrees.  Labs Review Labs Reviewed - No data to display  Imaging Review Dg Chest 2 View  12/20/2013   CLINICAL DATA:  Cough for 3 weeks, shortness of breath for 1 week. Asthma.  EXAM: CHEST  2 VIEW  COMPARISON:  Chest radiograph April 08, 2013  FINDINGS: Cardiomediastinal silhouette is unremarkable. Minimal prominence of the central bronchovascular markings. The lungs are clear without pleural effusions or focal consolidations. Trachea projects midline and there is no pneumothorax. Soft tissue planes and included osseous structures are non-suspicious.  IMPRESSION: Minimal prominence of central bronchovascular markings may reflect reactive airway disease without focal consolidation.   Electronically Signed   By: Awilda Metroourtnay  Bloomer   On: 12/20/2013 23:50     EKG Interpretation None      MDM   Final diagnoses:  Cough    Patient with history of cough x3 weeks. Also has history of asthma. Will treat with a Z-Pak. Plain films negative for pneumonia. No fevers. Patient is stable and ready for discharge.  I personally performed the services described in this documentation, which was scribed in my presence. The recorded information has been reviewed and is accurate.     Roxy Horsemanobert Caydence Enck, PA-C 12/21/13 [phone]

## 2013-12-20 NOTE — ED Notes (Signed)
Pt. reports persistent productive cough for 3 weeks with tenacious phlegm , denies pain , respirations unlabored , denies fever or chills.

## 2013-12-21 NOTE — ED Provider Notes (Signed)
Medical screening examination/treatment/procedure(s) were performed by non-physician practitioner and as supervising physician I was immediately available for consultation/collaboration.  Christopher J. Pollina, MD 12/21/13 1706 

## 2014-01-02 ENCOUNTER — Ambulatory Visit: Payer: No Typology Code available for payment source | Admitting: Internal Medicine

## 2014-02-16 ENCOUNTER — Encounter (HOSPITAL_COMMUNITY): Payer: Self-pay | Admitting: Emergency Medicine

## 2014-02-16 ENCOUNTER — Emergency Department (HOSPITAL_COMMUNITY)
Admission: EM | Admit: 2014-02-16 | Discharge: 2014-02-17 | Disposition: A | Discharge status: Home / Self Care | Payer: MEDICAID | Attending: Emergency Medicine | Admitting: Emergency Medicine

## 2014-02-16 DIAGNOSIS — Z79899 Other long term (current) drug therapy: Secondary | ICD-10-CM | POA: Insufficient documentation

## 2014-02-16 DIAGNOSIS — Z7982 Long term (current) use of aspirin: Secondary | ICD-10-CM | POA: Insufficient documentation

## 2014-02-16 DIAGNOSIS — R739 Hyperglycemia, unspecified: Secondary | ICD-10-CM

## 2014-02-16 DIAGNOSIS — R51 Headache: Secondary | ICD-10-CM | POA: Insufficient documentation

## 2014-02-16 DIAGNOSIS — I10 Essential (primary) hypertension: Secondary | ICD-10-CM | POA: Insufficient documentation

## 2014-02-16 DIAGNOSIS — H538 Other visual disturbances: Secondary | ICD-10-CM | POA: Insufficient documentation

## 2014-02-16 DIAGNOSIS — E1165 Type 2 diabetes mellitus with hyperglycemia: Secondary | ICD-10-CM | POA: Insufficient documentation

## 2014-02-16 DIAGNOSIS — Z792 Long term (current) use of antibiotics: Secondary | ICD-10-CM | POA: Insufficient documentation

## 2014-02-16 DIAGNOSIS — Z8669 Personal history of other diseases of the nervous system and sense organs: Secondary | ICD-10-CM | POA: Insufficient documentation

## 2014-02-16 DIAGNOSIS — R519 Headache, unspecified: Secondary | ICD-10-CM

## 2014-02-16 LAB — COMPREHENSIVE METABOLIC PANEL
ALBUMIN: 3.3 g/dL — AB (ref 3.5–5.2)
ALT: 8 U/L (ref 0–35)
AST: 11 U/L (ref 0–37)
Alkaline Phosphatase: 62 U/L (ref 39–117)
Anion gap: 10 (ref 5–15)
BILIRUBIN TOTAL: 0.3 mg/dL (ref 0.3–1.2)
BUN: 7 mg/dL (ref 6–23)
CHLORIDE: 100 meq/L (ref 96–112)
CO2: 25 mEq/L (ref 19–32)
CREATININE: 0.8 mg/dL (ref 0.50–1.10)
Calcium: 9 mg/dL (ref 8.4–10.5)
GFR calc Af Amer: >90 mL/min (ref >90)
GFR calc non Af Amer: >90 mL/min (ref >90)
Glucose, Bld: 270 mg/dL — ABNORMAL HIGH (ref 70–99)
Potassium: 4.6 mEq/L (ref 3.7–5.3)
Sodium: 135 mEq/L — ABNORMAL LOW (ref 137–147)
Total Protein: 6.9 g/dL (ref 6.0–8.3)

## 2014-02-16 LAB — URINALYSIS, ROUTINE W REFLEX MICROSCOPIC
Bilirubin Urine: NEGATIVE
Glucose, UA: 250 mg/dL — AB
Hgb urine dipstick: NEGATIVE
Ketones, ur: NEGATIVE mg/dL
Leukocytes, UA: NEGATIVE
NITRITE: NEGATIVE
PH: 5.5 (ref 5.0–8.0)
Protein, ur: NEGATIVE mg/dL
SPECIFIC GRAVITY, URINE: 1.027 (ref 1.005–1.030)
Urobilinogen, UA: 0.2 mg/dL (ref 0.0–1.0)

## 2014-02-16 LAB — CBC WITH DIFFERENTIAL/PLATELET
BASOS ABS: 0 10*3/uL (ref 0.0–0.1)
Basophils Relative: 1 % (ref 0–1)
Eosinophils Absolute: 0.1 10*3/uL (ref 0.0–0.7)
Eosinophils Relative: 2 % (ref 0–5)
HEMATOCRIT: 33.6 % — AB (ref 36.0–46.0)
Hemoglobin: 11.1 g/dL — ABNORMAL LOW (ref 12.0–15.0)
LYMPHS PCT: 49 % — AB (ref 12–46)
Lymphs Abs: 2.6 10*3/uL (ref 0.7–4.0)
MCH: 28.8 pg (ref 26.0–34.0)
MCHC: 33 g/dL (ref 30.0–36.0)
MCV: 87 fL (ref 78.0–100.0)
Monocytes Absolute: 0.4 10*3/uL (ref 0.1–1.0)
Monocytes Relative: 8 % (ref 3–12)
NEUTROS ABS: 2.1 10*3/uL (ref 1.7–7.7)
NEUTROS PCT: 40 % — AB (ref 43–77)
Platelets: 353 10*3/uL (ref 150–400)
RBC: 3.86 MIL/uL — ABNORMAL LOW (ref 3.87–5.11)
RDW: 13.5 % (ref 11.5–15.5)
WBC: 5.2 10*3/uL (ref 4.0–10.5)

## 2014-02-16 LAB — CBG MONITORING, ED: GLUCOSE-CAPILLARY: 234 mg/dL — AB (ref 70–99)

## 2014-02-16 MED ORDER — SODIUM CHLORIDE 0.9 % IV BOLUS (SEPSIS)
2000.0000 mL | Freq: Once | INTRAVENOUS | Status: AC
  Administered 2014-02-17: 2000 mL via INTRAVENOUS

## 2014-02-16 MED ORDER — MORPHINE SULFATE 4 MG/ML IJ SOLN
4.0000 mg | Freq: Once | INTRAMUSCULAR | Status: AC
  Administered 2014-02-17: 4 mg via INTRAVENOUS
  Filled 2014-02-16: qty 1

## 2014-02-16 NOTE — ED Provider Notes (Signed)
CSN: [id number]     Arrival date & time 02/16/14  2209 History   First MD Initiated Contact with Patient 02/16/14 2309     Chief Complaint  Patient presents with  . Hyperglycemia     (Consider location/radiation/quality/duration/timing/severity/associated sxs/prior Treatment) HPI Comments: Mariah Miller is a 38 y.o. female with a PMHx of HTN, DM (controlled on metformin 1000mg  BID, novolin 32U BID, and novolog SSI), and chronic migraines, with a  PSHx of tubal ligation, who presents to the ED with complaints of hyperglycemia( her blood sugar was 298 prior to arrival she has an associated left-sided headache and blurry vision which always occurs when blood sugar is above 240. She reports the headache is gradual onset, 8/10 sharp constant nonradiating left-sided pain worse with light and improved with ibuprofen, denies this is the worse headache of her life or had a thunderclap onset. She reports that her blurry vision is similar to prior hyperglycemic episodes. She states that she last took her insulin yesterday, but that she ran out of insulin syringes, and sent her brother to purchase them he never returned with the syringes. She denies any fevers, chills, chest pain, shortness of breath, abdominal pain, nausea, vomiting, diarrhea, constipation, dysuria, hematuria, vaginal discharge, vaginal bleeding, lightheadedness, dizziness, weakness, paresthesias, numbness, or focal neuro deficits. Denies any other symptoms.    Patient is a 38 y.o. female presenting with hyperglycemia. The history is provided by the patient. No language interpreter was used.  Hyperglycemia Blood sugar level PTA:  298 Severity:  Mild Onset quality:  Sudden Duration:  1 hour Timing:  Constant Progression:  Unchanged Chronicity:  Recurrent Diabetes status:  Controlled with insulin and controlled with oral medications Current diabetic therapy:  Humulin 70/30 32U BID, metformin 1000mg  BID, and SSI novolin with  meals Time since last antidiabetic medication:  24 hours Context comment:  Ran out of insulin syringes Relieved by:  None tried Ineffective treatments:  None tried Associated symptoms: blurred vision   Associated symptoms: no abdominal pain, no altered mental status, no chest pain, no confusion, no diaphoresis, no dizziness, no dysuria, no fever, no increased appetite, no increased thirst, no malaise, no nausea, no shortness of breath, no syncope, no vomiting and no weakness   Risk factors: obesity   Risk factors: no hx of DKA     Past Medical History  Diagnosis Date  . Hypertension   . Diabetes mellitus without complication   . Migraine    Past Surgical History  Procedure Laterality Date  . Cholecystectomy    . Tonsillectomy    . Tubal ligation    . Cyst excision     No family history on file. History  Substance Use Topics  . Smoking status: Never Smoker   . Smokeless tobacco: Not on file  . Alcohol Use: No   OB History    No data available     Review of Systems  Constitutional: Negative for fever, chills and diaphoresis.  HENT: Negative for congestion and sinus pressure.   Eyes: Positive for blurred vision and visual disturbance (blurry).  Respiratory: Negative for shortness of breath.   Cardiovascular: Negative for chest pain and syncope.  Gastrointestinal: Negative for nausea, vomiting, abdominal pain, diarrhea and constipation.  Endocrine: Negative for polydipsia.  Genitourinary: Negative for dysuria, hematuria, vaginal bleeding and vaginal discharge.  Musculoskeletal: Negative for myalgias, back pain, arthralgias and neck pain.  Skin: Negative for color change.  Neurological: Positive for headaches (L temporal). Negative for dizziness, weakness, light-headedness and  numbness.  Psychiatric/Behavioral: Negative for confusion.   10 Systems reviewed and are negative for acute change except as noted in the HPI.    Allergies  Review of patient's allergies  indicates no known allergies.  Home Medications   Prior to Admission medications   Medication Sig Start Date End Date Taking? Authorizing Provider  aspirin EC 81 MG tablet Take 1 tablet (81 mg total) by mouth daily. 01/22/13   Nishant Dhungel, MD  azithromycin (ZITHROMAX Z-PAK) 250 MG tablet Take 1 tablet (250 mg total) by mouth daily. 500mg  PO day 1, then 250mg  PO days 205 12/20/13   Roxy Horsemanobert Browning, PA-C  insulin aspart (NOVOLOG) 100 UNIT/ML injection Inject 3-10 Units into the skin 3 (three) times daily as needed for high blood sugar. 150 - 200 = 3 units, 201 - 250 = 5 units, 251 - 300 = 7 units 301 - 350 = 10 units, > 350, call or come to clinic 06/20/13   Quentin Angstlugbemiga E Jegede, MD  Insulin NPH Isophane & Regular (NOVOLIN 70/30 [place]) Inject 32 Units into the skin 2 (two) times daily.    Historical Provider, MD  lisinopril-hydrochlorothiazide (PRINZIDE,ZESTORETIC) 20-12.5 MG per tablet Take 1 tablet by mouth daily. 09/13/13   Doris Cheadleeepak Advani, MD  metFORMIN (GLUCOPHAGE) 1000 MG tablet Take 1 tablet (1,000 mg total) by mouth 2 (two) times daily with a meal. 09/13/13   Doris Cheadleeepak Advani, MD  simvastatin (ZOCOR) 10 MG tablet Take 1 tablet (10 mg total) by mouth at bedtime. 01/22/13   Nishant Dhungel, MD   BP 154/66 mmHg  Pulse 70  Temp(Src) 98.8 F (37.1 C) (Oral)  Resp 13  Ht 5\' 4"  (1.626 m)  Wt 212 lb (96.163 kg)  BMI 36.37 kg/m2  SpO2 100%  LMP 02/10/2014 Physical Exam  Constitutional: She is oriented to person, place, and time. Vital signs are normal. She appears well-developed and well-nourished.  Non-toxic appearance. No distress.  Afebrile, nontoxic, NAD  HENT:  Head: Normocephalic and atraumatic.  Nose: Nose normal.  Mouth/Throat: Uvula is midline, oropharynx is clear and moist and mucous membranes are normal. No trismus in the jaw.  Eyes: Conjunctivae and EOM are normal. Pupils are equal, round, and reactive to light. Right eye exhibits no discharge. Left eye exhibits no discharge.  PERRL,  EOMI  Neck: Normal range of motion. Neck supple.  Cardiovascular: Normal rate, regular rhythm, normal heart sounds and intact distal pulses.  Exam reveals no gallop and no friction rub.   No murmur heard. Pulmonary/Chest: Effort normal and breath sounds normal. No respiratory distress. She has no decreased breath sounds. She has no wheezes. She has no rhonchi. She has no rales.  Abdominal: Soft. Normal appearance and bowel sounds are normal. She exhibits no distension. There is no tenderness. There is no rigidity, no rebound, no guarding, no tenderness at McBurney's point and negative Murphy's sign.  Musculoskeletal: Normal range of motion.  MAE x4 Strength 5/5 in all extremities Sensation grossly intact in all extremities Gait steady  Neurological: She is alert and oriented to person, place, and time. She has normal strength. No cranial nerve deficit or sensory deficit. Gait normal. GCS eye subscore is 4. GCS verbal subscore is 5. GCS motor subscore is 6.  CN 2-12 grossly intact A&O x4 GCS 15 Sensation and strength intact Gait nontaxic and steady  Skin: Skin is warm, dry and intact. No rash noted.  Psychiatric: She has a normal mood and affect.  Nursing note and vitals reviewed.   ED Course  Procedures (including critical care time) Labs Review Labs Reviewed  CBC WITH DIFFERENTIAL - Abnormal; Notable for the following:    RBC 3.86 (*)    Hemoglobin 11.1 (*)    HCT 33.6 (*)    Neutrophils Relative % 40 (*)    Lymphocytes Relative 49 (*)    All other components within normal limits  COMPREHENSIVE METABOLIC PANEL - Abnormal; Notable for the following:    Sodium 135 (*)    Glucose, Bld 270 (*)    Albumin 3.3 (*)    All other components within normal limits  URINALYSIS, ROUTINE W REFLEX MICROSCOPIC - Abnormal; Notable for the following:    Glucose, UA 250 (*)    All other components within normal limits  CBG MONITORING, ED - Abnormal; Notable for the following:     Glucose-Capillary 234 (*)    All other components within normal limits    Imaging Review No results found.   EKG Interpretation None      MDM   Final diagnoses:  Hyperglycemia without ketosis  Nonintractable episodic headache, unspecified headache type  Blurry vision, bilateral    38 y.o. female here with hyperglycemia. States she always gets HA and vision blurring with CBG>240. Hasn't had her insulin in 24hrs due to running out of syringes. Neuro exam benign with no red flag s/sx for HA, therefore doubt need for CT or LP. CBC w/diff showing baseline anemia but otherwise WNL. U/A with no ketones. CMP pending. Will give 2L bolus and see if CBG improves. Will give small dose of morphine for her HA now. Will reassess after CMP and boluses. Might consider giving subQ insulin if boluses don't help.  1:37 AM CMP showing Na 135 which corrects to 137. Gluc 270, no AG. HA improved with 1L bolus and morphine. Pt care signed out to Dr. Judd Lienelo at shift change who will continue pt's care. Please see his dictation for further documentation of care.   BP 147/67 mmHg  Pulse 61  Temp(Src) 98.8 F (37.1 C) (Oral)  Resp 16  Ht 5\' 4"  (1.626 m)  Wt 212 lb (96.163 kg)  BMI 36.37 kg/m2  SpO2 99%  LMP 02/10/2014  Meds ordered this encounter  Medications  . sodium chloride 0.9 % bolus 2,000 mL    Sig:   . morphine 4 MG/ML injection 4 mg    Sig:      Donnita FallsMercedes Strupp Burlingtonamprubi-Soms, PA-C 02/17/14 0147  Geoffery Lyonsouglas Delo, MD 02/17/14 [phone]

## 2014-02-16 NOTE — ED Notes (Signed)
Pt monitored by pulse ox, bp cuff, and 5-lead. 

## 2014-02-16 NOTE — ED Notes (Signed)
Pt. reports elevated blood sugar at home = 298 , pt. stated she did not take her insulin ( ran out of insulin syringes) this evening , mild blurred vision , no nausea or vomitting / respirations unlabored.

## 2014-02-17 LAB — CBG MONITORING, ED: Glucose-Capillary: 210 mg/dL — ABNORMAL HIGH (ref 70–99)

## 2014-02-17 MED ORDER — INSULIN ASPART PROT & ASPART (70-30 MIX) 100 UNIT/ML ~~LOC~~ SUSP
32.0000 [IU] | Freq: Once | SUBCUTANEOUS | Status: AC
  Administered 2014-02-17: 32 [IU] via SUBCUTANEOUS
  Filled 2014-02-17: qty 10

## 2014-02-17 NOTE — Discharge Instructions (Signed)
Resume your insulin dose as before.  Return to the emergency department if you experience any new and concerning symptoms.   General Headache Without Cause A headache is pain or discomfort felt around the head or neck area. The specific cause of a headache may not be found. There are many causes and types of headaches. A few common ones are:  Tension headaches.  Migraine headaches.  Cluster headaches.  Chronic daily headaches. HOME CARE INSTRUCTIONS   Keep all follow-up appointments with your caregiver or any specialist referral.  Only take over-the-counter or prescription medicines for pain or discomfort as directed by your caregiver.  Lie down in a dark, quiet room when you have a headache.  Keep a headache journal to find out what may trigger your migraine headaches. For example, write down:  What you eat and drink.  How much sleep you get.  Any change to your diet or medicines.  Try massage or other relaxation techniques.  Put ice packs or heat on the head and neck. Use these 3 to 4 times per day for 15 to 20 minutes each time, or as needed.  Limit stress.  Sit up straight, and do not tense your muscles.  Quit smoking if you smoke.  Limit alcohol use.  Decrease the amount of caffeine you drink, or stop drinking caffeine.  Eat and sleep on a regular schedule.  Get 7 to 9 hours of sleep, or as recommended by your caregiver.  Keep lights dim if bright lights bother you and make your headaches worse. SEEK MEDICAL CARE IF:   You have problems with the medicines you were prescribed.  Your medicines are not working.  You have a change from the usual headache.  You have nausea or vomiting. SEEK IMMEDIATE MEDICAL CARE IF:   Your headache becomes severe.  You have a fever.  You have a stiff neck.  You have loss of vision.  You have muscular weakness or loss of muscle control.  You start losing your balance or have trouble walking.  You feel faint or  pass out.  You have severe symptoms that are different from your first symptoms. MAKE SURE YOU:   Understand these instructions.  Will watch your condition.  Will get help right away if you are not doing well or get worse. Document Released: 02/21/2005 Document Revised: 05/16/2011 Document Reviewed: 03/09/2011 [facility] Patient Information 2015 [place], [place]. This information is not intended to replace advice given to you by your health care provider. Make sure you discuss any questions you have with your health care provider.  Hyperglycemia Hyperglycemia occurs when the glucose (sugar) in your blood is too high. Hyperglycemia can happen for many reasons, but it most often happens to people who do not know they have diabetes or are not managing their diabetes properly.  CAUSES  Whether you have diabetes or not, there are other causes of hyperglycemia. Hyperglycemia can occur when you have diabetes, but it can also occur in other situations that you might not be as aware of, such as: Diabetes  If you have diabetes and are having problems controlling your blood glucose, hyperglycemia could occur because of some of the following reasons:  Not following your meal plan.  Not taking your diabetes medications or not taking it properly.  Exercising less or doing less activity than you normally do.  Being sick. Pre-diabetes  This cannot be ignored. Before people develop Type 2 diabetes, they almost always have "pre-diabetes." This is when your blood glucose  levels are higher than normal, but not yet high enough to be diagnosed as diabetes. Research has shown that some long-term damage to the body, especially the heart and circulatory system, may already be occurring during pre-diabetes. If you take action to manage your blood glucose when you have pre-diabetes, you may delay or prevent Type 2 diabetes from developing. Stress  If you have diabetes, you may be "diet" controlled or on oral  medications or insulin to control your diabetes. However, you may find that your blood glucose is higher than usual in the hospital whether you have diabetes or not. This is often referred to as "stress hyperglycemia." Stress can elevate your blood glucose. This happens because of hormones put out by the body during times of stress. If stress has been the cause of your high blood glucose, it can be followed regularly by your caregiver. That way he/she can make sure your hyperglycemia does not continue to get worse or progress to diabetes. Steroids  Steroids are medications that act on the infection fighting system (immune system) to block inflammation or infection. One side effect can be a rise in blood glucose. Most people can produce enough extra insulin to allow for this rise, but for those who cannot, steroids make blood glucose levels go even higher. It is not unusual for steroid treatments to "uncover" diabetes that is developing. It is not always possible to determine if the hyperglycemia will go away after the steroids are stopped. A special blood test called an A1c is sometimes done to determine if your blood glucose was elevated before the steroids were started. SYMPTOMS  Thirsty.  Frequent urination.  Dry mouth.  Blurred vision.  Tired or fatigue.  Weakness.  Sleepy.  Tingling in feet or leg. DIAGNOSIS  Diagnosis is made by monitoring blood glucose in one or all of the following ways:  A1c test. This is a chemical found in your blood.  Fingerstick blood glucose monitoring.  Laboratory results. TREATMENT  First, knowing the cause of the hyperglycemia is important before the hyperglycemia can be treated. Treatment may include, but is not be limited to:  Education.  Change or adjustment in medications.  Change or adjustment in meal plan.  Treatment for an illness, infection, etc.  More frequent blood glucose monitoring.  Change in exercise plan.  Decreasing or  stopping steroids.  Lifestyle changes. HOME CARE INSTRUCTIONS   Test your blood glucose as directed.  Exercise regularly. Your caregiver will give you instructions about exercise. Pre-diabetes or diabetes which comes on with stress is helped by exercising.  Eat wholesome, balanced meals. Eat often and at regular, fixed times. Your caregiver or nutritionist will give you a meal plan to guide your sugar intake.  Being at an ideal weight is important. If needed, losing as little as 10 to 15 pounds may help improve blood glucose levels. SEEK MEDICAL CARE IF:   You have questions about medicine, activity, or diet.  You continue to have symptoms (problems such as increased thirst, urination, or weight gain). SEEK IMMEDIATE MEDICAL CARE IF:   You are vomiting or have diarrhea.  Your breath smells fruity.  You are breathing faster or slower.  You are very sleepy or incoherent.  You have numbness, tingling, or pain in your feet or hands.  You have chest pain.  Your symptoms get worse even though you have been following your caregiver's orders.  If you have any other questions or concerns. Document Released: 08/17/2000 Document Revised: 05/16/2011 Document Reviewed:  06/20/2011 ExitCare Patient Information 2015 [place], [place]. This information is not intended to replace advice given to you by your health care provider. Make sure you discuss any questions you have with your health care provider.

## 2014-02-18 ENCOUNTER — Other Ambulatory Visit: Payer: Self-pay | Admitting: Internal Medicine

## 2014-02-26 ENCOUNTER — Other Ambulatory Visit: Payer: Self-pay | Admitting: Emergency Medicine

## 2014-02-26 MED ORDER — GLUCOSE BLOOD VI STRP: ORAL_STRIP | Status: DC

## 2014-03-11 ENCOUNTER — Ambulatory Visit: Payer: No Typology Code available for payment source | Admitting: Internal Medicine

## 2014-03-17 ENCOUNTER — Ambulatory Visit (HOSPITAL_BASED_OUTPATIENT_CLINIC_OR_DEPARTMENT_OTHER): Payer: No Typology Code available for payment source | Admitting: *Deleted

## 2014-03-17 ENCOUNTER — Encounter: Payer: Self-pay | Admitting: Internal Medicine

## 2014-03-17 ENCOUNTER — Ambulatory Visit: Payer: No Typology Code available for payment source | Attending: Internal Medicine | Admitting: Internal Medicine

## 2014-03-17 VITALS — BP 145/77 | HR 85 | Temp 98.2°F | Resp 16 | Wt 220.0 lb

## 2014-03-17 DIAGNOSIS — Z79899 Other long term (current) drug therapy: Secondary | ICD-10-CM | POA: Insufficient documentation

## 2014-03-17 DIAGNOSIS — I10 Essential (primary) hypertension: Secondary | ICD-10-CM

## 2014-03-17 DIAGNOSIS — Z23 Encounter for immunization: Secondary | ICD-10-CM

## 2014-03-17 DIAGNOSIS — E089 Diabetes mellitus due to underlying condition without complications: Secondary | ICD-10-CM

## 2014-03-17 DIAGNOSIS — Z794 Long term (current) use of insulin: Secondary | ICD-10-CM | POA: Insufficient documentation

## 2014-03-17 DIAGNOSIS — E785 Hyperlipidemia, unspecified: Secondary | ICD-10-CM

## 2014-03-17 DIAGNOSIS — E119 Type 2 diabetes mellitus without complications: Secondary | ICD-10-CM

## 2014-03-17 DIAGNOSIS — D509 Iron deficiency anemia, unspecified: Secondary | ICD-10-CM

## 2014-03-17 DIAGNOSIS — Z7982 Long term (current) use of aspirin: Secondary | ICD-10-CM | POA: Insufficient documentation

## 2014-03-17 LAB — LIPID PANEL
CHOL/HDL RATIO: 3.8 ratio
Cholesterol: 169 mg/dL (ref 0–200)
HDL: 44 mg/dL (ref >39)
LDL CALC: 105 mg/dL — AB (ref 0–99)
TRIGLYCERIDES: 98 mg/dL (ref <150)
VLDL: 20 mg/dL (ref 0–40)

## 2014-03-17 LAB — CBC WITH DIFFERENTIAL/PLATELET
BASOS ABS: 0 10*3/uL (ref 0.0–0.1)
BASOS PCT: 0 % (ref 0–1)
EOS ABS: 0.1 10*3/uL (ref 0.0–0.7)
EOS PCT: 1 % (ref 0–5)
HCT: 33.9 % — ABNORMAL LOW (ref 36.0–46.0)
Hemoglobin: 10.9 g/dL — ABNORMAL LOW (ref 12.0–15.0)
Lymphocytes Relative: 45 % (ref 12–46)
Lymphs Abs: 3.9 10*3/uL (ref 0.7–4.0)
MCH: 28.5 pg (ref 26.0–34.0)
MCHC: 32.2 g/dL (ref 30.0–36.0)
MCV: 88.7 fL (ref 78.0–100.0)
MPV: 9.5 fL (ref 8.6–12.4)
Monocytes Absolute: 0.9 10*3/uL (ref 0.1–1.0)
Monocytes Relative: 10 % (ref 3–12)
NEUTROS PCT: 44 % (ref 43–77)
Neutro Abs: 3.8 10*3/uL (ref 1.7–7.7)
PLATELETS: 385 10*3/uL (ref 150–400)
RBC: 3.82 MIL/uL — ABNORMAL LOW (ref 3.87–5.11)
RDW: 14.9 % (ref 11.5–15.5)
WBC: 8.7 10*3/uL (ref 4.0–10.5)

## 2014-03-17 LAB — FERRITIN: FERRITIN: 7 ng/mL — AB (ref 10–291)

## 2014-03-17 LAB — IRON AND TIBC
%SAT: 9 % — AB (ref 20–55)
Iron: 35 ug/dL — ABNORMAL LOW (ref 42–145)
TIBC: 373 ug/dL (ref 250–470)
UIBC: 338 ug/dL (ref 125–400)

## 2014-03-17 LAB — POCT GLYCOSYLATED HEMOGLOBIN (HGB A1C): Hemoglobin A1C: 8

## 2014-03-17 LAB — GLUCOSE, POCT (MANUAL RESULT ENTRY): POC GLUCOSE: 85 mg/dL (ref 70–99)

## 2014-03-17 MED ORDER — SIMVASTATIN 10 MG PO TABS: 10.0000 mg | ORAL_TABLET | Freq: Every day | ORAL | Status: DC

## 2014-03-17 MED ORDER — METFORMIN HCL 1000 MG PO TABS: 1000.0000 mg | ORAL_TABLET | Freq: Two times a day (BID) | ORAL | Status: DC

## 2014-03-17 MED ORDER — GABAPENTIN 300 MG PO CAPS: 300.0000 mg | ORAL_CAPSULE | Freq: Three times a day (TID) | ORAL | Status: DC

## 2014-03-17 MED ORDER — LISINOPRIL-HYDROCHLOROTHIAZIDE 20-12.5 MG PO TABS
1.0000 | ORAL_TABLET | Freq: Every day | ORAL | Status: DC
Start: 2014-03-17 — End: 2015-04-30

## 2014-03-17 NOTE — Progress Notes (Signed)
Patient ID: Mariah Miller, female   DOB: April 14, 1975, 39 y.o.   MRN: [id number]   Mariah Miller, is a 39 y.o. female  [id number]  [id number]  DOB - November 22, 1975  Chief Complaint  Patient presents with  . Follow-up        Subjective:   Mariah Miller is a 39 y.o. female here today for a follow up visit. Patient has hypertension, diabetes mellitus uncontrolled, dyslipidemia here today for routine follow-up. Patient has not followed up in the clinic for more than 6 months. Her major complaint today is occasional hypoglycemia. She claimed about 2 weeks ago she was very weak at work and almost fell , 911 was called, her blood sugar was found to be 63 , she was given some drinks and her blood sugar went up to about 100, her blood pressure was found to be in the 200s systolic , she took her medication and felt better. Now she occasionally feels headache , but when she repeats her blood pressure sometimes is normal sometimes only slightly high especially when at work. She works third shift, at her job is intense. No fainting attack, no blurry vision, no outright fall. She feels weak occasionally, she would like to take a day off to rest as suggested by her supervisor. She does not smoke cigarettes, she does not drink alcohol. Patient has No chest pain, No abdominal pain - No Nausea, No new weakness tingling or numbness, No Cough - SOB.  Problem  Anemia, Iron Deficiency  Dyslipidemia  Essential Hypertension    ALLERGIES: No Known Allergies  PAST MEDICAL HISTORY: Past Medical History  Diagnosis Date  . Hypertension   . Diabetes mellitus without complication   . Migraine     MEDICATIONS AT HOME: Prior to Admission medications   Medication Sig Start Date End Date Taking? Authorizing Provider  albuterol (PROVENTIL HFA;VENTOLIN HFA) 108 (90 BASE) MCG/ACT inhaler Inhale 2 puffs into the lungs every 6 (six) hours as needed for wheezing or shortness of breath.   Yes Historical  Provider, MD  aspirin EC 81 MG tablet Take 1 tablet (81 mg total) by mouth daily. 01/22/13  Yes Nishant Dhungel, MD  gabapentin (NEURONTIN) 300 MG capsule Take 1 capsule (300 mg total) by mouth 3 (three) times daily. 03/17/14  Yes Quentin Angst, MD  glucose blood test strip Use as instructed 02/26/14  Yes Naithen Rivenburg E Hyman Hopes, MD  insulin aspart (NOVOLOG) 100 UNIT/ML injection Inject 3-10 Units into the skin 3 (three) times daily as needed for high blood sugar. 150 - 200 = 3 units, 201 - 250 = 5 units, 251 - 300 = 7 units 301 - 350 = 10 units, > 350, call or come to clinic 06/20/13  Yes Claire Dolores E Hyman Hopes, MD  Insulin NPH Isophane & Regular (NOVOLIN 70/30 [place]) Inject 32 Units into the skin 2 (two) times daily.   Yes Historical Provider, MD  lisinopril-hydrochlorothiazide (PRINZIDE,ZESTORETIC) 20-12.5 MG per tablet Take 1 tablet by mouth daily. 03/17/14  Yes Quentin Angst, MD  metFORMIN (GLUCOPHAGE) 1000 MG tablet Take 1 tablet (1,000 mg total) by mouth 2 (two) times daily with a meal. 03/17/14  Yes Quentin Angst, MD  simvastatin (ZOCOR) 10 MG tablet Take 1 tablet (10 mg total) by mouth at bedtime. 03/17/14  Yes Quentin Angst, MD  azithromycin (ZITHROMAX Z-PAK) 250 MG tablet Take 1 tablet (250 mg total) by mouth daily.  PO day 1, then  PO days 205 Patient not taking: Reported on 02/16/2014  12/20/13   Roxy Horsemanobert Browning, PA-C     Objective:   Filed Vitals:   03/17/14 0935  BP: 145/77  Pulse: 85  Temp: 98.2 F (36.8 C)  Resp: 16  Weight: 220 lb (99.791 kg)  SpO2: 100%  PF: 54 L/min    Exam General appearance : Awake, alert, not in any distress. Speech Clear. Not toxic looking, obese HEENT: Atraumatic and Normocephalic, pupils equally reactive to light and accomodation Neck: supple, no JVD. No cervical lymphadenopathy.  Chest:Good air entry bilaterally, no added sounds  CVS: S1 S2 regular, no murmurs.  Abdomen: Bowel sounds present, Non tender and not distended  with no gaurding, rigidity or rebound. Extremities: B/L Lower Ext shows no edema, both legs are warm to touch Neurology: Awake alert, and oriented X 3, CN II-XII intact, Non focal Skin:No Rash Wounds:N/A  Data Review Lab Results  Component Value Date   HGBA1C 8.0 03/17/2014   HGBA1C 8.2 09/13/2013   HGBA1C 9.0* 04/08/2013     Assessment & Plan   1. Type 2 diabetes mellitus without complication  - HgB A1c is 8.0% today from 8.2% 3 months ago - Glucose (CBG)  - CBC with Differential - Lipid panel - Urinalysis, Complete   - metFORMIN (GLUCOPHAGE) 1000 MG tablet; Take 1 tablet (1,000 mg total) by mouth 2 (two) times daily with a meal.  Dispense: 180 tablet; Refill: 3 - gabapentin (NEURONTIN) 300 MG capsule; Take 1 capsule (300 mg total) by mouth 3 (three) times daily.  Dispense: 270 capsule; Refill: 3   Aim for 2-3 Carb Choices per meal (30-45 grams) +/- 1 either way  Aim for 0-15 Carbs per snack if hungry  Include protein in moderation with your meals and snacks  Consider reading food labels for Total Carbohydrate and Fat Grams of foods  Consider checking BG at alternate times per day  Continue taking medication as directed Fruit Punch - find one with no sugar  Measure and decrease portions of carbohydrate foods  Make your plate and don't go back for seconds   2. Anemia, iron deficiency:  May be the cause of patient's weakness and easy fatigability  - Ferritin - Iron and TIBC  3. Dyslipidemia  - simvastatin (ZOCOR) 10 MG tablet; Take 1 tablet (10 mg total) by mouth at bedtime.  Dispense: 90 tablet; Refill: 3  4. Essential hypertension  - lisinopril-hydrochlorothiazide (PRINZIDE,ZESTORETIC) 20-12.5 MG per tablet; Take 1 tablet by mouth daily.  Dispense: 90 tablet; Refill: 3   Patient was extensively counseled about nutrition and exercise Patient has been educated on blood sugar AS well as blood pressure goals, she is encouraged to keep blood sugar log for the next  2 weeks and bring same to the clinic for review, will know how to adjust her medications then.   Return for Hemoglobin A1C and Follow up, DM, Follow up HTN, Follow up Pain and comorbidities.  The patient was given clear instructions to go to ER or return to medical center if symptoms don't improve, worsen or new problems develop. The patient verbalized understanding. The patient was told to call to get lab results if they haven't heard anything in the next week.   This note has been created with Education officer, environmentalDragon speech recognition software and smart phrase technology. Any transcriptional errors are unintentional.    Jeanann LewandowskyJEGEDE, Emmanuelle Hibbitts, MD, MHA, FACP, FAAP [facility] [place], [place] [phone]   03/17/2014, 10:33 AM

## 2014-03-17 NOTE — Patient Instructions (Signed)
DASH Eating Plan DASH stands for "Dietary Approaches to Stop Hypertension." The DASH eating plan is a healthy eating plan that has been shown to reduce high blood pressure (hypertension). Additional health benefits may include reducing the risk of type 2 diabetes mellitus, heart disease, and stroke. The DASH eating plan may also help with weight loss. WHAT DO I NEED TO KNOW ABOUT THE DASH EATING PLAN? For the DASH eating plan, you will follow these general guidelines:  Choose foods with a percent daily value for sodium of less than 5% (as listed on the food label).  Use salt-free seasonings or herbs instead of table salt or sea salt.  Check with your health care provider or pharmacist before using salt substitutes.  Eat lower-sodium products, often labeled as "lower sodium" or "no salt added."  Eat fresh foods.  Eat more vegetables, fruits, and low-fat dairy products.  Choose whole grains. Look for the word "whole" as the first word in the ingredient list.  Choose fish and skinless chicken or turkey more often than red meat. Limit fish, poultry, and meat to 6 oz (170 g) each day.  Limit sweets, desserts, sugars, and sugary drinks.  Choose heart-healthy fats.  Limit cheese to 1 oz (28 g) per day.  Eat more home-cooked food and less restaurant, buffet, and fast food.  Limit fried foods.  Cook foods using methods other than frying.  Limit canned vegetables. If you do use them, rinse them well to decrease the sodium.  When eating at a restaurant, ask that your food be prepared with less salt, or no salt if possible. WHAT FOODS CAN I EAT? Seek help from a dietitian for individual calorie needs. Grains Whole grain or whole wheat bread. Brown rice. Whole grain or whole wheat pasta. Quinoa, bulgur, and whole grain cereals. Low-sodium cereals. Corn or whole wheat flour tortillas. Whole grain cornbread. Whole grain crackers. Low-sodium crackers. Vegetables Fresh or frozen vegetables  (raw, steamed, roasted, or grilled). Low-sodium or reduced-sodium tomato and vegetable juices. Low-sodium or reduced-sodium tomato sauce and paste. Low-sodium or reduced-sodium canned vegetables.  Fruits All fresh, canned (in natural juice), or frozen fruits. Meat and Other Protein Products Ground beef (85% or leaner), grass-fed beef, or beef trimmed of fat. Skinless chicken or turkey. Ground chicken or turkey. Pork trimmed of fat. All fish and seafood. Eggs. Dried beans, peas, or lentils. Unsalted nuts and seeds. Unsalted canned beans. Dairy Low-fat dairy products, such as skim or 1% milk, 2% or reduced-fat cheeses, low-fat ricotta or cottage cheese, or plain low-fat yogurt. Low-sodium or reduced-sodium cheeses. Fats and Oils Tub margarines without trans fats. Light or reduced-fat mayonnaise and salad dressings (reduced sodium). Avocado. Safflower, olive, or canola oils. Natural peanut or almond butter. Other Unsalted popcorn and pretzels. The items listed above may not be a complete list of recommended foods or beverages. Contact your dietitian for more options. WHAT FOODS ARE NOT RECOMMENDED? Grains White bread. White pasta. White rice. Refined cornbread. Bagels and croissants. Crackers that contain trans fat. Vegetables Creamed or fried vegetables. Vegetables in a cheese sauce. Regular canned vegetables. Regular canned tomato sauce and paste. Regular tomato and vegetable juices. Fruits Dried fruits. Canned fruit in light or heavy syrup. Fruit juice. Meat and Other Protein Products Fatty cuts of meat. Ribs, chicken wings, bacon, sausage, bologna, salami, chitterlings, fatback, hot dogs, bratwurst, and packaged luncheon meats. Salted nuts and seeds. Canned beans with salt. Dairy Whole or 2% milk, cream, half-and-half, and cream cheese. Whole-fat or sweetened yogurt. Full-fat   cheeses or blue cheese. Nondairy creamers and whipped toppings. Processed cheese, cheese spreads, or cheese  curds. Condiments Onion and garlic salt, seasoned salt, table salt, and sea salt. Canned and packaged gravies. Worcestershire sauce. Tartar sauce. Barbecue sauce. Teriyaki sauce. Soy sauce, including reduced sodium. Steak sauce. Fish sauce. Oyster sauce. Cocktail sauce. Horseradish. Ketchup and mustard. Meat flavorings and tenderizers. Bouillon cubes. Hot sauce. Tabasco sauce. Marinades. Taco seasonings. Relishes. Fats and Oils Butter, stick margarine, lard, shortening, ghee, and bacon fat. Coconut, palm kernel, or palm oils. Regular salad dressings. Other Pickles and olives. Salted popcorn and pretzels. The items listed above may not be a complete list of foods and beverages to avoid. Contact your dietitian for more information. WHERE CAN I FIND MORE INFORMATION? [employer], [employer]: www.nhlbi.nih.gov/health/health-topics/topics/dash/ Document Released: 02/10/2011 Document Revised: 07/08/2013 Document Reviewed: 12/26/2012 Exit[employer] Patient Information 2015 [employer]. This information is not intended to replace advice given to you by your health care provider. Make sure you discuss any questions you have with your health care provider. Hypertension Hypertension, commonly called high blood pressure, is when the force of blood pumping through your arteries is too strong. Your arteries are the blood vessels that carry blood from your heart throughout your body. A blood pressure reading consists of a higher number over a lower number, such as 110/72. The higher number (systolic) is the pressure inside your arteries when your heart pumps. The lower number (diastolic) is the pressure inside your arteries when your heart relaxes. Ideally you want your blood pressure below 120/80. Hypertension forces your heart to work harder to pump blood. Your arteries may become narrow or stiff. Having hypertension puts you at risk for heart disease, stroke, and other problems.  RISK  FACTORS Some risk factors for high blood pressure are controllable. Others are not.  Risk factors you cannot control include:   Race. You may be at higher risk if you are African American.  Age. Risk increases with age.  Gender. Men are at higher risk than women before age 45 years. After age 65, women are at higher risk than men. Risk factors you can control include:  Not getting enough exercise or physical activity.  Being overweight.  Getting too much fat, sugar, calories, or salt in your diet.  Drinking too much alcohol. SIGNS AND SYMPTOMS Hypertension does not usually cause signs or symptoms. Extremely high blood pressure (hypertensive crisis) may cause headache, anxiety, shortness of breath, and nosebleed. DIAGNOSIS  To check if you have hypertension, your health care provider will measure your blood pressure while you are seated, with your arm held at the level of your heart. It should be measured at least twice using the same arm. Certain conditions can cause a difference in blood pressure between your right and left arms. A blood pressure reading that is higher than normal on one occasion does not mean that you need treatment. If one blood pressure reading is high, ask your health care provider about having it checked again. TREATMENT  Treating high blood pressure includes making lifestyle changes and possibly taking medicine. Living a healthy lifestyle can help lower high blood pressure. You may need to change some of your habits. Lifestyle changes may include:  Following the DASH diet. This diet is high in fruits, vegetables, and whole grains. It is low in salt, red meat, and added sugars.  Getting at least 2 hours of brisk physical activity every week.  Losing weight if necessary.  Not smoking.  Limiting   alcoholic beverages.  Learning ways to reduce stress. If lifestyle changes are not enough to get your blood pressure under control, your health care provider may  prescribe medicine. You may need to take more than one. Work closely with your health care provider to understand the risks and benefits. HOME CARE INSTRUCTIONS  Have your blood pressure rechecked as directed by your health care provider.   Take medicines only as directed by your health care provider. Follow the directions carefully. Blood pressure medicines must be taken as prescribed. The medicine does not work as well when you skip doses. Skipping doses also puts you at risk for problems.   Do not smoke.   Monitor your blood pressure at home as directed by your health care provider. SEEK MEDICAL CARE IF:   You think you are having a reaction to medicines taken.  You have recurrent headaches or feel dizzy.  You have swelling in your ankles.  You have trouble with your vision. SEEK IMMEDIATE MEDICAL CARE IF:  You develop a severe headache or confusion.  You have unusual weakness, numbness, or feel faint.  You have severe chest or abdominal pain.  You vomit repeatedly.  You have trouble breathing. MAKE SURE YOU:   Understand these instructions.  Will watch your condition.  Will get help right away if you are not doing well or get worse. Document Released: 02/21/2005 Document Revised: 07/08/2013 Document Reviewed: 12/14/2012 Exit[employer] Patient Information 2015 [employer]. This information is not intended to replace advice given to you by your health care provider. Make sure you discuss any questions you have with your health care provider. Diabetes and Exercise Exercising regularly is important. It is not just about losing weight. It has many health benefits, such as:  Improving your overall fitness, flexibility, and endurance.  Increasing your bone density.  Helping with weight control.  Decreasing your body fat.  Increasing your muscle strength.  Reducing stress and tension.  Improving your overall health. People with diabetes who exercise gain  additional benefits because exercise:  Reduces appetite.  Improves the body's use of blood sugar (glucose).  Helps lower or control blood glucose.  Decreases blood pressure.  Helps control blood lipids (such as cholesterol and triglycerides).  Improves the body's use of the hormone insulin by:  Increasing the body's insulin sensitivity.  Reducing the body's insulin needs.  Decreases the risk for heart disease because exercising:  Lowers cholesterol and triglycerides levels.  Increases the levels of good cholesterol (such as high-density lipoproteins [HDL]) in the body.  Lowers blood glucose levels. YOUR ACTIVITY PLAN  Choose an activity that you enjoy and set realistic goals. Your health care provider or diabetes educator can help you make an activity plan that works for you. Exercise regularly as directed by your health care provider. This includes:  Performing resistance training twice a week such as push-ups, sit-ups, lifting weights, or using resistance bands.  Performing 150 minutes of cardio exercises each week such as walking, running, or playing sports.  Staying active and spending no more than 90 minutes at one time being inactive. Even short bursts of exercise are good for you. Three 10-minute sessions spread throughout the day are just as beneficial as a single 30-minute session. Some exercise ideas include:  Taking the dog for a walk.  Taking the stairs instead of the elevator.  Dancing to your favorite song.  Doing an exercise video.  Doing your favorite exercise with a friend. RECOMMENDATIONS FOR EXERCISING WITH TYPE 1 OR   TYPE 2 DIABETES   Check your blood glucose before exercising. If blood glucose levels are greater than 240 mg/dL, check for urine ketones. Do not exercise if ketones are present.  Avoid injecting insulin into areas of the body that are going to be exercised. For example, avoid injecting insulin into:  The arms when playing  tennis.  The legs when jogging.  Keep a record of:  Food intake before and after you exercise.  Expected peak times of insulin action.  Blood glucose levels before and after you exercise.  The type and amount of exercise you have done.  Review your records with your health care provider. Your health care provider will help you to develop guidelines for adjusting food intake and insulin amounts before and after exercising.  If you take insulin or oral hypoglycemic agents, watch for signs and symptoms of hypoglycemia. They include:  Dizziness.  Shaking.  Sweating.  Chills.  Confusion.  Drink plenty of water while you exercise to prevent dehydration or heat stroke. Body water is lost during exercise and must be replaced.  Talk to your health care provider before starting an exercise program to make sure it is safe for you. Remember, almost any type of activity is better than none. Document Released: 05/14/2003 Document Revised: 07/08/2013 Document Reviewed: 07/31/2012 [employer] Patient Information 2015 [employer]. This information is not intended to replace advice given to you by your health care provider. Make sure you discuss any questions you have with your health care provider. Basic Carbohydrate Counting for Diabetes Mellitus Carbohydrate counting is a method for keeping track of the amount of carbohydrates you eat. Eating carbohydrates naturally increases the level of sugar (glucose) in your blood, so it is important for you to know the amount that is okay for you to have in every meal. Carbohydrate counting helps keep the level of glucose in your blood within normal limits. The amount of carbohydrates allowed is different for every person. A dietitian can help you calculate the amount that is right for you. Once you know the amount of carbohydrates you can have, you can count the carbohydrates in the foods you want to eat. Carbohydrates are found in the following  foods:  Grains, such as breads and cereals.  Dried beans and soy products.  Starchy vegetables, such as potatoes, peas, and corn.  Fruit and fruit juices.  Milk and yogurt.  Sweets and snack foods, such as cake, cookies, candy, chips, soft drinks, and fruit drinks. CARBOHYDRATE COUNTING There are two ways to count the carbohydrates in your food. You can use either of the methods or a combination of both. Reading the "Nutrition Facts" on Packaged Food The "Nutrition Facts" is an area that is included on the labels of almost all packaged food and beverages in the [place]. It includes the serving size of that food or beverage and information about the nutrients in each serving of the food, including the grams (g) of carbohydrate per serving.  Decide the number of servings of this food or beverage that you will be able to eat or drink. Multiply that number of servings by the number of grams of carbohydrate that is listed on the label for that serving. The total will be the amount of carbohydrates you will be having when you eat or drink this food or beverage. Learning Standard Serving Sizes of Food When you eat food that is not packaged or does not include "Nutrition Facts" on the label, you need to measure the   servings in order to count the amount of carbohydrates.A serving of most carbohydrate-rich foods contains about 15 g of carbohydrates. The following list includes serving sizes of carbohydrate-rich foods that provide 15 g ofcarbohydrate per serving:   1 slice of bread (1 oz) or 1 six-inch tortilla.    of a hamburger bun or English muffin.  4-6 crackers.   cup unsweetened dry cereal.    cup hot cereal.   cup rice or pasta.    cup mashed potatoes or  of a large baked potato.  1 cup fresh fruit or one small piece of fruit.    cup canned or frozen fruit or fruit juice.  1 cup milk.   cup plain fat-free yogurt or yogurt sweetened with artificial  sweeteners.   cup cooked dried beans or starchy vegetable, such as peas, corn, or potatoes.  Decide the number of standard-size servings that you will eat. Multiply that number of servings by 15 (the grams of carbohydrates in that serving). For example, if you eat 2 cups of strawberries, you will have eaten 2 servings and 30 g of carbohydrates (2 servings x 15 g = 30 g). For foods such as soups and casseroles, in which more than one food is mixed in, you will need to count the carbohydrates in each food that is included. EXAMPLE OF CARBOHYDRATE COUNTING Sample Dinner  3 oz chicken breast.   cup of brown rice.   cup of corn.  1 cup milk.   1 cup strawberries with sugar-free whipped topping.  Carbohydrate Calculation Step 1: Identify the foods that contain carbohydrates:   Rice.   Corn.   Milk.   Strawberries. Step 2:Calculate the number of servings eaten of each:   2 servings of rice.   1 serving of corn.   1 serving of milk.   1 serving of strawberries. Step 3: Multiply each of those number of servings by 15 g:   2 servings of rice x 15 g = 30 g.   1 serving of corn x 15 g = 15 g.   1 serving of milk x 15 g = 15 g.   1 serving of strawberries x 15 g = 15 g. Step 4: Add together all of the amounts to find the total grams of carbohydrates eaten: 30 g + 15 g + 15 g + 15 g = 75 g. Document Released: 02/21/2005 Document Revised: 07/08/2013 Document Reviewed: 01/18/2013 [employer] Patient Information 2015 [employer]. This information is not intended to replace advice given to you by your health care provider. Make sure you discuss any questions you have with your health care provider.  

## 2014-03-17 NOTE — Progress Notes (Signed)
DM2 follow up EMS called while pt at work CBG 63 BP 239/108 Pt. Not transported to hospital Pt. Complains of headaches and being tired Wants flu shot

## 2014-03-18 LAB — URINALYSIS, COMPLETE
BACTERIA UA: NONE SEEN
Bilirubin Urine: NEGATIVE
CRYSTALS: NONE SEEN
Casts: NONE SEEN
GLUCOSE, UA: NEGATIVE mg/dL
Hgb urine dipstick: NEGATIVE
Ketones, ur: NEGATIVE mg/dL
Leukocytes, UA: NEGATIVE
Nitrite: NEGATIVE
PH: 6 (ref 5.0–8.0)
Protein, ur: NEGATIVE mg/dL
Specific Gravity, Urine: 1.028 (ref 1.005–1.030)
Squamous Epithelial / LPF: NONE SEEN
Urobilinogen, UA: 0.2 mg/dL (ref 0.0–1.0)

## 2014-03-27 ENCOUNTER — Ambulatory Visit: Payer: No Typology Code available for payment source | Attending: Internal Medicine | Admitting: Internal Medicine

## 2014-03-27 ENCOUNTER — Encounter: Payer: Self-pay | Admitting: Internal Medicine

## 2014-03-27 VITALS — BP 150/86 | HR 83 | Temp 98.4°F | Resp 16 | Ht 64.0 in | Wt 218.0 lb

## 2014-03-27 DIAGNOSIS — Z0000 Encounter for general adult medical examination without abnormal findings: Secondary | ICD-10-CM

## 2014-03-27 DIAGNOSIS — N852 Hypertrophy of uterus: Secondary | ICD-10-CM

## 2014-03-27 DIAGNOSIS — D509 Iron deficiency anemia, unspecified: Secondary | ICD-10-CM

## 2014-03-27 DIAGNOSIS — E119 Type 2 diabetes mellitus without complications: Secondary | ICD-10-CM

## 2014-03-27 LAB — POCT URINALYSIS DIPSTICK
Bilirubin, UA: NEGATIVE
Blood, UA: NEGATIVE
Glucose, UA: NEGATIVE
Ketones, UA: NEGATIVE
Leukocytes, UA: NEGATIVE
Nitrite, UA: NEGATIVE
Protein, UA: 100
Spec Grav, UA: >=1.03
Urobilinogen, UA: 0.2
pH, UA: 6

## 2014-03-27 LAB — GLUCOSE, POCT (MANUAL RESULT ENTRY): POC Glucose: 64 mg/dl — AB (ref 70–99)

## 2014-03-27 MED ORDER — GLUCOSE BLOOD VI STRP: ORAL_STRIP | Status: DC

## 2014-03-27 MED ORDER — DAPAGLIFLOZIN PROPANEDIOL 10 MG PO TABS: 10.0000 mg | ORAL_TABLET | Freq: Every day | ORAL | Status: DC

## 2014-03-27 MED ORDER — FERROUS SULFATE 325 (65 FE) MG PO TABS: 325.0000 mg | ORAL_TABLET | Freq: Two times a day (BID) | ORAL | Status: DC

## 2014-03-27 MED ORDER — INSULIN NPH ISOPHANE & REGULAR (70-30) 100 UNIT/ML ~~LOC~~ SUSP: 20.0000 [IU] | Freq: Two times a day (BID) | SUBCUTANEOUS | Status: DC

## 2014-03-27 NOTE — Patient Instructions (Signed)
DASH Eating Plan DASH stands for "Dietary Approaches to Stop Hypertension." The DASH eating plan is a healthy eating plan that has been shown to reduce high blood pressure (hypertension). Additional health benefits may include reducing the risk of type 2 diabetes mellitus, heart disease, and stroke. The DASH eating plan may also help with weight loss. WHAT DO I NEED TO KNOW ABOUT THE DASH EATING PLAN? For the DASH eating plan, you will follow these general guidelines:  Choose foods with a percent daily value for sodium of less than 5% (as listed on the food label).  Use salt-free seasonings or herbs instead of table salt or sea salt.  Check with your health care provider or pharmacist before using salt substitutes.  Eat lower-sodium products, often labeled as "lower sodium" or "no salt added."  Eat fresh foods.  Eat more vegetables, fruits, and low-fat dairy products.  Choose whole grains. Look for the word "whole" as the first word in the ingredient list.  Choose fish and skinless chicken or turkey more often than red meat. Limit fish, poultry, and meat to 6 oz (170 g) each day.  Limit sweets, desserts, sugars, and sugary drinks.  Choose heart-healthy fats.  Limit cheese to 1 oz (28 g) per day.  Eat more home-cooked food and less restaurant, buffet, and fast food.  Limit fried foods.  Cook foods using methods other than frying.  Limit canned vegetables. If you do use them, rinse them well to decrease the sodium.  When eating at a restaurant, ask that your food be prepared with less salt, or no salt if possible. WHAT FOODS CAN I EAT? Seek help from a dietitian for individual calorie needs. Grains Whole grain or whole wheat bread. Brown rice. Whole grain or whole wheat pasta. Quinoa, bulgur, and whole grain cereals. Low-sodium cereals. Corn or whole wheat flour tortillas. Whole grain cornbread. Whole grain crackers. Low-sodium crackers. Vegetables Fresh or frozen vegetables  (raw, steamed, roasted, or grilled). Low-sodium or reduced-sodium tomato and vegetable juices. Low-sodium or reduced-sodium tomato sauce and paste. Low-sodium or reduced-sodium canned vegetables.  Fruits All fresh, canned (in natural juice), or frozen fruits. Meat and Other Protein Products Ground beef (85% or leaner), grass-fed beef, or beef trimmed of fat. Skinless chicken or turkey. Ground chicken or turkey. Pork trimmed of fat. All fish and seafood. Eggs. Dried beans, peas, or lentils. Unsalted nuts and seeds. Unsalted canned beans. Dairy Low-fat dairy products, such as skim or 1% milk, 2% or reduced-fat cheeses, low-fat ricotta or cottage cheese, or plain low-fat yogurt. Low-sodium or reduced-sodium cheeses. Fats and Oils Tub margarines without trans fats. Light or reduced-fat mayonnaise and salad dressings (reduced sodium). Avocado. Safflower, olive, or canola oils. Natural peanut or almond butter. Other Unsalted popcorn and pretzels. The items listed above may not be a complete list of recommended foods or beverages. Contact your dietitian for more options. WHAT FOODS ARE NOT RECOMMENDED? Grains White bread. White pasta. White rice. Refined cornbread. Bagels and croissants. Crackers that contain trans fat. Vegetables Creamed or fried vegetables. Vegetables in a cheese sauce. Regular canned vegetables. Regular canned tomato sauce and paste. Regular tomato and vegetable juices. Fruits Dried fruits. Canned fruit in light or heavy syrup. Fruit juice. Meat and Other Protein Products Fatty cuts of meat. Ribs, chicken wings, bacon, sausage, bologna, salami, chitterlings, fatback, hot dogs, bratwurst, and packaged luncheon meats. Salted nuts and seeds. Canned beans with salt. Dairy Whole or 2% milk, cream, half-and-half, and cream cheese. Whole-fat or sweetened yogurt. Full-fat   cheeses or blue cheese. Nondairy creamers and whipped toppings. Processed cheese, cheese spreads, or cheese  curds. Condiments Onion and garlic salt, seasoned salt, table salt, and sea salt. Canned and packaged gravies. Worcestershire sauce. Tartar sauce. Barbecue sauce. Teriyaki sauce. Soy sauce, including reduced sodium. Steak sauce. Fish sauce. Oyster sauce. Cocktail sauce. Horseradish. Ketchup and mustard. Meat flavorings and tenderizers. Bouillon cubes. Hot sauce. Tabasco sauce. Marinades. Taco seasonings. Relishes. Fats and Oils Butter, stick margarine, lard, shortening, ghee, and bacon fat. Coconut, palm kernel, or palm oils. Regular salad dressings. Other Pickles and olives. Salted popcorn and pretzels. The items listed above may not be a complete list of foods and beverages to avoid. Contact your dietitian for more information. WHERE CAN I FIND MORE INFORMATION? National Heart, Lung, and [employer] Institute: www.nhlbi.nih.gov/health/health-topics/topics/dash/ Document Released: 02/10/2011 Document Revised: 07/08/2013 Document Reviewed: 12/26/2012 ExitCare Patient Information 2015 [employer]. This information is not intended to replace advice given to you by your health care provider. Make sure you discuss any questions you have with your health care provider. Diabetes and Exercise Exercising regularly is important. It is not just about losing weight. It has many health benefits, such as:  Improving your overall fitness, flexibility, and endurance.  Increasing your bone density.  Helping with weight control.  Decreasing your body fat.  Increasing your muscle strength.  Reducing stress and tension.  Improving your overall health. People with diabetes who exercise gain additional benefits because exercise:  Reduces appetite.  Improves the body's use of blood sugar (glucose).  Helps lower or control blood glucose.  Decreases blood pressure.  Helps control blood lipids (such as cholesterol and triglycerides).  Improves the body's use of the hormone insulin by:  Increasing the  body's insulin sensitivity.  Reducing the body's insulin needs.  Decreases the risk for heart disease because exercising:  Lowers cholesterol and triglycerides levels.  Increases the levels of good cholesterol (such as high-density lipoproteins [HDL]) in the body.  Lowers blood glucose levels. YOUR ACTIVITY PLAN  Choose an activity that you enjoy and set realistic goals. Your health care provider or diabetes educator can help you make an activity plan that works for you. Exercise regularly as directed by your health care provider. This includes:  Performing resistance training twice a week such as push-ups, sit-ups, lifting weights, or using resistance bands.  Performing 150 minutes of cardio exercises each week such as walking, running, or playing sports.  Staying active and spending no more than 90 minutes at one time being inactive. Even short bursts of exercise are good for you. Three 10-minute sessions spread throughout the day are just as beneficial as a single 30-minute session. Some exercise ideas include:  Taking the dog for a walk.  Taking the stairs instead of the elevator.  Dancing to your favorite song.  Doing an exercise video.  Doing your favorite exercise with a friend. RECOMMENDATIONS FOR EXERCISING WITH TYPE 1 OR TYPE 2 DIABETES   Check your blood glucose before exercising. If blood glucose levels are greater than 240 mg/dL, check for urine ketones. Do not exercise if ketones are present.  Avoid injecting insulin into areas of the body that are going to be exercised. For example, avoid injecting insulin into:  The arms when playing tennis.  The legs when jogging.  Keep a record of:  Food intake before and after you exercise.  Expected peak times of insulin action.  Blood glucose levels before and after you exercise.  The type and amount of exercise   you have done.  Review your records with your health care provider. Your health care provider will  help you to develop guidelines for adjusting food intake and insulin amounts before and after exercising.  If you take insulin or oral hypoglycemic agents, watch for signs and symptoms of hypoglycemia. They include:  Dizziness.  Shaking.  Sweating.  Chills.  Confusion.  Drink plenty of water while you exercise to prevent dehydration or heat stroke. Body water is lost during exercise and must be replaced.  Talk to your health care provider before starting an exercise program to make sure it is safe for you. Remember, almost any type of activity is better than none. Document Released: 05/14/2003 Document Revised: 07/08/2013 Document Reviewed: 07/31/2012 ExitCare Patient Information 2015 [employer]. This information is not intended to replace advice given to you by your health care provider. Make sure you discuss any questions you have with your health care provider. Basic Carbohydrate Counting for Diabetes Mellitus Carbohydrate counting is a method for keeping track of the amount of carbohydrates you eat. Eating carbohydrates naturally increases the level of sugar (glucose) in your blood, so it is important for you to know the amount that is okay for you to have in every meal. Carbohydrate counting helps keep the level of glucose in your blood within normal limits. The amount of carbohydrates allowed is different for every person. A dietitian can help you calculate the amount that is right for you. Once you know the amount of carbohydrates you can have, you can count the carbohydrates in the foods you want to eat. Carbohydrates are found in the following foods:  Grains, such as breads and cereals.  Dried beans and soy products.  Starchy vegetables, such as potatoes, peas, and corn.  Fruit and fruit juices.  Milk and yogurt.  Sweets and snack foods, such as cake, cookies, candy, chips, soft drinks, and fruit drinks. CARBOHYDRATE COUNTING There are two ways to count the  carbohydrates in your food. You can use either of the methods or a combination of both. Reading the "Nutrition Facts" on Packaged Food The "Nutrition Facts" is an area that is included on the labels of almost all packaged food and beverages in the [place]. It includes the serving size of that food or beverage and information about the nutrients in each serving of the food, including the grams (g) of carbohydrate per serving.  Decide the number of servings of this food or beverage that you will be able to eat or drink. Multiply that number of servings by the number of grams of carbohydrate that is listed on the label for that serving. The total will be the amount of carbohydrates you will be having when you eat or drink this food or beverage. Learning Standard Serving Sizes of Food When you eat food that is not packaged or does not include "Nutrition Facts" on the label, you need to measure the servings in order to count the amount of carbohydrates.A serving of most carbohydrate-rich foods contains about 15 g of carbohydrates. The following list includes serving sizes of carbohydrate-rich foods that provide 15 g ofcarbohydrate per serving:   1 slice of bread (1 oz) or 1 six-inch tortilla.    of a hamburger bun or English muffin.  4-6 crackers.   cup unsweetened dry cereal.    cup hot cereal.   cup rice or pasta.    cup mashed potatoes or  of a large baked potato.  1 cup fresh fruit or one   small piece of fruit.    cup canned or frozen fruit or fruit juice.  1 cup milk.   cup plain fat-free yogurt or yogurt sweetened with artificial sweeteners.   cup cooked dried beans or starchy vegetable, such as peas, corn, or potatoes.  Decide the number of standard-size servings that you will eat. Multiply that number of servings by 15 (the grams of carbohydrates in that serving). For example, if you eat 2 cups of strawberries, you will have eaten 2 servings and 30 g of  carbohydrates (2 servings x 15 g = 30 g). For foods such as soups and casseroles, in which more than one food is mixed in, you will need to count the carbohydrates in each food that is included. EXAMPLE OF CARBOHYDRATE COUNTING Sample Dinner  3 oz chicken breast.   cup of brown rice.   cup of corn.  1 cup milk.   1 cup strawberries with sugar-free whipped topping.  Carbohydrate Calculation Step 1: Identify the foods that contain carbohydrates:   Rice.   Corn.   Milk.   Strawberries. Step 2:Calculate the number of servings eaten of each:   2 servings of rice.   1 serving of corn.   1 serving of milk.   1 serving of strawberries. Step 3: Multiply each of those number of servings by 15 g:   2 servings of rice x 15 g = 30 g.   1 serving of corn x 15 g = 15 g.   1 serving of milk x 15 g = 15 g.   1 serving of strawberries x 15 g = 15 g. Step 4: Add together all of the amounts to find the total grams of carbohydrates eaten: 30 g + 15 g + 15 g + 15 g = 75 g. Document Released: 02/21/2005 Document Revised: 07/08/2013 Document Reviewed: 01/18/2013 ExitCare Patient Information 2015 [employer]. This information is not intended to replace advice given to you by your health care provider. Make sure you discuss any questions you have with your health care provider.  

## 2014-03-27 NOTE — Progress Notes (Signed)
Pt comes in for annual physical/pap smear with STD screening Last pap over 3 yrs ago, normal Pt denies vaginal d/c or pain LMP- 03/08/14 States after taking her 70/30 insulin last night CBG 58 this am,asymptomatic Recheck 64 will follow hypoglycemia protocol Need refill on test strips  Pt has CBG log

## 2014-03-27 NOTE — Progress Notes (Signed)
Patient ID: Mariah Miller, female   DOB: [birth year], 39 y.o.   MRN: [id number]   Mariah Miller, is a 39 y.o. female  [id number]  [id number]  DOB - 28-Dec-1975  Chief Complaint  Patient presents with  . Follow-up  . Annual Exam  . Gynecologic Exam        Subjective:   Mariah Miller is a 39 y.o. female here today for a follow up visit. Patient is not to have hypertension and diabetes, blood sugar is better controlled but with occasional hypoglycemia, asymptomatic, blood pressure is also better controlled. Patient is here today for Pap smear. No new complaints. Patient is compliant with medication. She is currently on insulin 70/30, 32 units every 12 hourly. She is also on metformin. She reports no side effects to medications. Patient has No headache, No chest pain, No abdominal pain - No Nausea, No new weakness tingling or numbness, No Cough - SOB.  No problems updated.  ALLERGIES: No Known Allergies  PAST MEDICAL HISTORY: Past Medical History  Diagnosis Date  . Hypertension   . Diabetes mellitus without complication   . Migraine     MEDICATIONS AT HOME: Prior to Admission medications   Medication Sig Start Date End Date Taking? Authorizing Provider  aspirin EC 81 MG tablet Take 1 tablet (81 mg total) by mouth daily. 01/22/13  Yes Nishant Dhungel, MD  gabapentin (NEURONTIN) 300 MG capsule Take 1 capsule (300 mg total) by mouth 3 (three) times daily. 03/17/14  Yes Jaleal Schliep Annitta Needs, MD  insulin aspart (NOVOLOG) 100 UNIT/ML injection Inject 3-10 Units into the skin 3 (three) times daily as needed for high blood sugar. 150 - 200 = 3 units, 201 - 250 = 5 units, 251 - 300 = 7 units 301 - 350 = 10 units, > 350, call or come to clinic 06/20/13  Yes Debora Stockdale E Hyman Hopes, MD  insulin NPH-regular Human (NOVOLIN 70/30) (70-30) 100 UNIT/ML injection Inject 20 Units into the skin 2 (two) times daily. 03/27/14  Yes Quentin Angst, MD  lisinopril-hydrochlorothiazide  (PRINZIDE,ZESTORETIC) 20-12.5 MG per tablet Take 1 tablet by mouth daily. 03/17/14  Yes Quentin Angst, MD  metFORMIN (GLUCOPHAGE) 1000 MG tablet Take 1 tablet (1,000 mg total) by mouth 2 (two) times daily with a meal. 03/17/14  Yes Quentin Angst, MD  simvastatin (ZOCOR) 10 MG tablet Take 1 tablet (10 mg total) by mouth at bedtime. 03/17/14  Yes Quentin Angst, MD  albuterol (PROVENTIL HFA;VENTOLIN HFA) 108 (90 BASE) MCG/ACT inhaler Inhale 2 puffs into the lungs every 6 (six) hours as needed for wheezing or shortness of breath.    Historical Provider, MD  azithromycin (ZITHROMAX Z-PAK) 250 MG tablet Take 1 tablet (250 mg total) by mouth daily.  PO day 1, then  PO days 205 Patient not taking: Reported on 02/16/2014 12/20/13   Roxy Horseman, PA-C  dapagliflozin propanediol (FARXIGA) 10 MG TABS tablet Take 10 mg by mouth daily. 03/27/14   Quentin Angst, MD  ferrous sulfate 325 (65 FE) MG tablet Take 1 tablet (325 mg total) by mouth 2 (two) times daily with a meal. 03/27/14   Quentin Angst, MD  glucose blood test strip Use as instructed 03/27/14   Quentin Angst, MD     Objective:   Filed Vitals:   03/27/14 0916  BP: 150/86  Pulse: 83  Temp: 98.4 F (36.9 C)  TempSrc: Oral  Resp: 16  Height:  (1.626 m)  Weight: 218 lb (98.884  kg)  SpO2: 100%    Exam General appearance : Awake, alert, not in any distress. Speech Clear. Not toxic looking, obese HEENT: Atraumatic and Normocephalic, pupils equally reactive to light and accomodation Neck: supple, no JVD. No cervical lymphadenopathy.  Chest:Good air entry bilaterally, no added sounds  CVS: S1 S2 regular, no murmurs.  Abdomen: Bowel sounds present, Non tender and not distended with no gaurding, rigidity or rebound. Extremities: B/L Lower Ext shows no edema, both legs are warm to touch Neurology: Awake alert, and oriented X 3, CN II-XII intact, Non focal Pelvic examination: Normal female external  genitalia, bulky and irregular Cervix with anterior lip, bulky uterus, some bleeding to contact noticed, negative cervical excitation tenderness.  Data Review Lab Results  Component Value Date   HGBA1C 8.0 03/17/2014   HGBA1C 8.2 09/13/2013   HGBA1C 9.0* 04/08/2013     Assessment & Plan   1. Type 2 diabetes mellitus without complication From patient's blood sugar log, there is recurrent hypoglycemic episodes, given the patient is asymptomatic. We'll reduce patient's insulin dose from 32 units every 12 hourly to 20 units twice a day Add Farxiga to regimen  We discussed blood sugar goal, patient encouraged to continue low carbohydrate, low cholesterol, low sugar diet and also to continue to keep blood sugar log.  - glucose blood test strip; Use as instructed  Dispense: 100 each; Refill: 12 - dapagliflozin propanediol (FARXIGA) 10 MG TABS tablet; Take 10 mg by mouth daily.  Dispense: 30 tablet; Refill: 3 - insulin NPH-regular Human (NOVOLIN 70/30) (70-30) 100 UNIT/ML injection; Inject 20 Units into the skin 2 (two) times daily.  Dispense: 30 mL; Refill: 3 -  Aim for 2-3 Carb Choices per meal (30-45 grams) +/- 1 either way  Aim for 0-15 Carbs per snack if hungry  Include protein in moderation with your meals and snacks  Consider reading food labels for Total Carbohydrate and Fat Grams of foods  Consider checking BG at alternate times per day  Continue taking medication as directed Fruit Punch - find one with no sugar  Measure and decrease portions of carbohydrate foods  Make your plate and don't go back for seconds   2. Annual physical exam  - Urinalysis Dipstick - Cytology - PAP [place] - Cervicovaginal ancillary only - HIV antibody (with reflex)  3. Anemia, iron deficiency  - ferrous sulfate 325 (65 FE) MG tablet; Take 1 tablet (325 mg total) by mouth 2 (two) times daily with a meal.  Dispense: 180 tablet; Refill: 3   Patient was counseled extensively about nutrition  and exercise.   Return in about 4 weeks (around 04/24/2014), or if symptoms worsen or fail to improve, for BP Check, Nurse Visit, CBG, Lab/Nurse Visit.  The patient was given clear instructions to go to ER or return to medical center if symptoms don't improve, worsen or new problems develop. The patient verbalized understanding. The patient was told to call to get lab results if they haven't heard anything in the next week.   This note has been created with Education officer, environmentalDragon speech recognition software and smart phrase technology. Any transcriptional errors are unintentional.    Jeanann LewandowskyJEGEDE, Kemper Hochman, MD, MHA, FACP, FAAP [facility], [place] [phone]   03/27/2014, 10:21 AM

## 2014-03-28 LAB — CERVICOVAGINAL ANCILLARY ONLY
Chlamydia: NEGATIVE
Neisseria Gonorrhea: NEGATIVE
Wet Prep (BD Affirm): NEGATIVE
Wet Prep (BD Affirm): NEGATIVE
Wet Prep (BD Affirm): POSITIVE — AB

## 2014-03-28 LAB — HIV ANTIBODY (ROUTINE TESTING W REFLEX): HIV 1&2 Ab, 4th Generation: NONREACTIVE

## 2014-03-28 LAB — CYTOLOGY - PAP

## 2014-03-31 ENCOUNTER — Telehealth: Payer: Self-pay | Admitting: Emergency Medicine

## 2014-03-31 MED ORDER — METRONIDAZOLE 500 MG PO TABS: 500.0000 mg | ORAL_TABLET | Freq: Two times a day (BID) | ORAL | Status: DC

## 2014-03-31 NOTE — Telephone Encounter (Signed)
Left message for to call for lab results Medication Flagyl 500 mg tab e-scribed to [facility] pharmacy

## 2014-03-31 NOTE — Telephone Encounter (Signed)
Left message for pt to call nurse line for pap smear results 

## 2014-03-31 NOTE — Telephone Encounter (Signed)
-----   Message from Quentin Angstlugbemiga E Jegede, MD sent at 03/31/2014 10:03 AM EST ----- Please inform patient that her laboratory test results are mostly within normal limits, HIV is negative as of 03/27/2014. Her Pap smear shows infection with bacterial vaginosis, not a sexually transmitted disease and treatable with antibiotics. We are awaiting pap smear cytology report. Please call in prescription metronidazole 500 mg tablet by mouth twice a day for 7 days, advised patient to abstain from alcohol and its treatment, continue other medications for diabetes.

## 2014-04-01 ENCOUNTER — Ambulatory Visit (HOSPITAL_COMMUNITY): Payer: MEDICAID

## 2014-04-01 ENCOUNTER — Telehealth: Payer: Self-pay | Admitting: Emergency Medicine

## 2014-04-01 NOTE — Telephone Encounter (Signed)
Pt given lab/pap smear results with instructions to start taking prescribed Flagyl 500 mg tab BID x 7 days [facility]

## 2014-04-02 ENCOUNTER — Other Ambulatory Visit: Payer: Self-pay | Admitting: Internal Medicine

## 2014-04-02 DIAGNOSIS — IMO0002 Reserved for concepts with insufficient information to code with codable children: Secondary | ICD-10-CM

## 2014-04-08 ENCOUNTER — Telehealth: Payer: Self-pay | Admitting: Emergency Medicine

## 2014-04-08 ENCOUNTER — Encounter: Payer: Self-pay | Admitting: Obstetrics & Gynecology

## 2014-04-08 NOTE — Telephone Encounter (Signed)
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 Referral Placed.

## 2014-04-08 NOTE — Telephone Encounter (Signed)
Left message for pt to return call.

## 2014-04-10 ENCOUNTER — Telehealth: Payer: Self-pay | Admitting: Internal Medicine

## 2014-04-10 ENCOUNTER — Telehealth: Payer: Self-pay

## 2014-04-10 NOTE — Telephone Encounter (Signed)
Returned patient phone call Patient not available Left message on machine to return our call  

## 2014-04-10 NOTE — Telephone Encounter (Signed)
Pt is calling to inquire about her results. Please follow up with pt. Pt works 3rd shift and for this reason has not been able to answer previous call.

## 2014-04-11 ENCOUNTER — Ambulatory Visit (HOSPITAL_COMMUNITY): Payer: MEDICAID

## 2014-04-11 ENCOUNTER — Ambulatory Visit (HOSPITAL_COMMUNITY)
Admission: RE | Admit: 2014-04-11 | Discharge: 2014-04-11 | Disposition: A | Discharge status: Home / Self Care | Payer: No Typology Code available for payment source | Source: Ambulatory Visit | Attending: Internal Medicine | Admitting: Internal Medicine

## 2014-04-11 DIAGNOSIS — N852 Hypertrophy of uterus: Secondary | ICD-10-CM | POA: Insufficient documentation

## 2014-04-14 ENCOUNTER — Telehealth: Payer: Self-pay | Admitting: Emergency Medicine

## 2014-04-14 NOTE — Telephone Encounter (Signed)
Pt given pap smear results and to f/u with scheduled GYN referral due to high risk HPV Pt instructed to keep appt. Requesting results u/s

## 2014-04-17 ENCOUNTER — Telehealth: Payer: Self-pay | Admitting: Emergency Medicine

## 2014-04-17 NOTE — Telephone Encounter (Signed)
-----   Message from Quentin Angstlugbemiga E Jegede, MD sent at 04/16/2014  4:55 PM EST ----- Please inform patient that her pelvic ultrasound shows normal appearing uterus with a small right cyst, no mass.

## 2014-04-17 NOTE — Telephone Encounter (Signed)
Pt given ultrasound results with instructions to keep scheduled [facility] appt

## 2014-05-12 ENCOUNTER — Encounter: Payer: No Typology Code available for payment source | Admitting: Obstetrics & Gynecology

## 2014-05-15 ENCOUNTER — Emergency Department (HOSPITAL_COMMUNITY)
Admission: EM | Admit: 2014-05-15 | Discharge: 2014-05-15 | Disposition: A | Discharge status: Home / Self Care | Payer: No Typology Code available for payment source | Attending: Emergency Medicine | Admitting: Emergency Medicine

## 2014-05-15 ENCOUNTER — Encounter (HOSPITAL_COMMUNITY): Payer: Self-pay

## 2014-05-15 DIAGNOSIS — I10 Essential (primary) hypertension: Secondary | ICD-10-CM | POA: Insufficient documentation

## 2014-05-15 DIAGNOSIS — Z7982 Long term (current) use of aspirin: Secondary | ICD-10-CM | POA: Insufficient documentation

## 2014-05-15 DIAGNOSIS — Z79899 Other long term (current) drug therapy: Secondary | ICD-10-CM | POA: Insufficient documentation

## 2014-05-15 DIAGNOSIS — R51 Headache: Secondary | ICD-10-CM

## 2014-05-15 DIAGNOSIS — G43909 Migraine, unspecified, not intractable, without status migrainosus: Secondary | ICD-10-CM | POA: Insufficient documentation

## 2014-05-15 DIAGNOSIS — R519 Headache, unspecified: Secondary | ICD-10-CM

## 2014-05-15 DIAGNOSIS — E119 Type 2 diabetes mellitus without complications: Secondary | ICD-10-CM | POA: Insufficient documentation

## 2014-05-15 DIAGNOSIS — Z794 Long term (current) use of insulin: Secondary | ICD-10-CM | POA: Insufficient documentation

## 2014-05-15 DIAGNOSIS — Z792 Long term (current) use of antibiotics: Secondary | ICD-10-CM | POA: Insufficient documentation

## 2014-05-15 MED ORDER — METOCLOPRAMIDE HCL 5 MG/ML IJ SOLN
10.0000 mg | INTRAMUSCULAR | Status: AC
  Administered 2014-05-15: 10 mg via INTRAVENOUS
  Filled 2014-05-15: qty 2

## 2014-05-15 MED ORDER — SODIUM CHLORIDE 0.9 % IV BOLUS (SEPSIS)
1000.0000 mL | Freq: Once | INTRAVENOUS | Status: AC
Start: 2014-05-15 — End: 2014-05-15
  Administered 2014-05-15: 1000 mL via INTRAVENOUS

## 2014-05-15 MED ORDER — DEXAMETHASONE SODIUM PHOSPHATE 10 MG/ML IJ SOLN
10.0000 mg | Freq: Once | INTRAMUSCULAR | Status: AC
  Administered 2014-05-15: 10 mg via INTRAVENOUS
  Filled 2014-05-15: qty 1

## 2014-05-15 MED ORDER — DIPHENHYDRAMINE HCL 50 MG/ML IJ SOLN
25.0000 mg | Freq: Once | INTRAMUSCULAR | Status: AC
  Administered 2014-05-15: 25 mg via INTRAVENOUS
  Filled 2014-05-15: qty 1

## 2014-05-15 NOTE — ED Notes (Signed)
Pt sts she awoke this am with a headache.  sts she developed blurred vision as the day went on.  Pain 10/10.  Denies photophobia, nausea/vomiting.

## 2014-05-15 NOTE — Discharge Instructions (Signed)
Follow-up with your primary care physician. °Return to the ED for new or worsening symptoms. °

## 2014-05-15 NOTE — ED Provider Notes (Signed)
CSN: [id number]     Arrival date & time 05/15/14  1611 History   First MD Initiated Contact with Patient 05/15/14 1952     Chief Complaint  Patient presents with  . Headache  . Blurred Vision     (Consider location/radiation/quality/duration/timing/severity/associated sxs/prior Treatment) Patient is a 39 y.o. female presenting with headaches. The history is provided by the patient and medical records.  Headache  This is a 39 year old female with past medical history significant for hypertension, diabetes, migraine headaches, presenting to the ED for headache. Patient states headache was present upon waking and has been progressively worsening throughout the day today. She states is localized throughout her forehead and both sides of her head, described as a throbbing sensation. She endorses associated photophobia, phonophobia, nausea, and sensation of blurred vision.  She denies any numbness, weakness, paresthesias, changes in speech, tinnitus, vomiting, gait disturbance, or confusion.  Past Medical History  Diagnosis Date  . Hypertension   . Diabetes mellitus without complication   . Migraine    Past Surgical History  Procedure Laterality Date  . Cholecystectomy    . Tonsillectomy    . Tubal ligation    . Cyst excision     History reviewed. No pertinent family history. History  Substance Use Topics  . Smoking status: Never Smoker   . Smokeless tobacco: Not on file  . Alcohol Use: No   OB History    No data available     Review of Systems  Neurological: Positive for headaches.  All other systems reviewed and are negative.     Allergies  Review of patient's allergies indicates no known allergies.  Home Medications   Prior to Admission medications   Medication Sig Start Date End Date Taking? Authorizing Provider  aspirin EC 81 MG tablet Take 1 tablet (81 mg total) by mouth daily. 01/22/13  Yes Nishant Dhungel, MD  dapagliflozin propanediol (FARXIGA) 10 MG TABS  tablet Take 10 mg by mouth daily. 03/27/14  Yes Quentin Angstlugbemiga E Jegede, MD  ferrous sulfate 325 (65 FE) MG tablet Take 1 tablet (325 mg total) by mouth 2 (two) times daily with a meal. 03/27/14  Yes Olugbemiga E Hyman HopesJegede, MD  gabapentin (NEURONTIN) 300 MG capsule Take 1 capsule (300 mg total) by mouth 3 (three) times daily. 03/17/14  Yes Olugbemiga Annitta NeedsE Jegede, MD  insulin aspart (NOVOLOG) 100 UNIT/ML injection Inject 3-10 Units into the skin 3 (three) times daily as needed for high blood sugar. 150 - 200 = 3 units, 201 - 250 = 5 units, 251 - 300 = 7 units 301 - 350 = 10 units, > 350, call or come to clinic 06/20/13  Yes Olugbemiga E Hyman HopesJegede, MD  insulin NPH-regular Human (NOVOLIN 70/30) (70-30) 100 UNIT/ML injection Inject 20 Units into the skin 2 (two) times daily. 03/27/14  Yes Quentin Angstlugbemiga E Jegede, MD  lisinopril-hydrochlorothiazide (PRINZIDE,ZESTORETIC) 20-12.5 MG per tablet Take 1 tablet by mouth daily. 03/17/14  Yes Quentin Angstlugbemiga E Jegede, MD  metFORMIN (GLUCOPHAGE) 1000 MG tablet Take 1 tablet (1,000 mg total) by mouth 2 (two) times daily with a meal. 03/17/14  Yes Quentin Angstlugbemiga E Jegede, MD  simvastatin (ZOCOR) 10 MG tablet Take 1 tablet (10 mg total) by mouth at bedtime. 03/17/14  Yes Quentin Angstlugbemiga E Jegede, MD  azithromycin (ZITHROMAX Z-PAK) 250 MG tablet Take 1 tablet (250 mg total) by mouth daily. 500mg  PO day 1, then 250mg  PO days 205 Patient not taking: Reported on 02/16/2014 12/20/13   Roxy Horsemanobert Browning, PA-C  metroNIDAZOLE (FLAGYL) 500  MG tablet Take 1 tablet (500 mg total) by mouth 2 (two) times daily. Patient not taking: Reported on 05/15/2014 03/31/14   Quentin Angst, MD   BP 158/81 mmHg  Pulse 90  Temp(Src) 98.1 F (36.7 C) (Oral)  Resp 14  Ht  (1.626 m)  Wt 215 lb (97.523 kg)  BMI 36.89 kg/m2  SpO2 98%  LMP 04/28/2014   Physical Exam  Constitutional: She is oriented to person, place, and time. She appears well-developed and well-nourished. No distress.  texting on cell phone without  difficulty  HENT:  Head: Normocephalic and atraumatic.  Mouth/Throat: Oropharynx is clear and moist.  Eyes: Conjunctivae and EOM are normal. Pupils are equal, round, and reactive to light.  EOMs intact; normal confrontation, no field cuts, no nystagmus  Neck: Normal range of motion and full passive range of motion without pain. Neck supple. No spinous process tenderness and no muscular tenderness present. No rigidity.  Full ROM, no rigidity  Cardiovascular: Normal rate, regular rhythm and normal heart sounds.   Pulmonary/Chest: Effort normal and breath sounds normal. No respiratory distress. She has no wheezes.  Abdominal: Soft. Bowel sounds are normal. There is no tenderness. There is no guarding.  Musculoskeletal: Normal range of motion. She exhibits no edema.  Neurological: She is alert and oriented to person, place, and time.  AAOx3, answering questions appropriately; equal strength UE and LE bilaterally; CN grossly intact; moves all extremities appropriately without ataxia; no focal neuro deficits or facial asymmetry appreciated  Skin: Skin is warm and dry. She is not diaphoretic.  Psychiatric: She has a normal mood and affect.  Nursing note and vitals reviewed.   ED Course  Procedures (including critical care time) Labs Review Labs Reviewed - No data to display  Imaging Review No results found.   EKG Interpretation None      MDM   Final diagnoses:  Headache, unspecified headache type   39 year old female here with progressively worsening headache and blurred vision throughout the day today. On exam, patient is afebrile and nontoxic in appearance. She is able to text on her cell phone without difficulty. She has no clinical signs of meningitis and her neurologic exam is nonfocal.  Suspect migraine type headache, will treat with migraine cocktail.  After medications and IV fluids headache has resolved. Patient has been resting comfortably and states she feels much better.  Her neurologic exam remains nonfocal. Patient be discharged home with close PCP follow-up.  Discussed plan with patient, he/she acknowledged understanding and agreed with plan of care.  Return precautions given for new or worsening symptoms.  Garlon Hatchet, PA-C 05/15/14 2350  Rolland Porter, MD 05/24/14 [phone]

## 2014-06-06 ENCOUNTER — Ambulatory Visit (INDEPENDENT_AMBULATORY_CARE_PROVIDER_SITE_OTHER): Payer: Self-pay | Admitting: Obstetrics & Gynecology

## 2014-06-06 ENCOUNTER — Encounter: Payer: Self-pay | Admitting: Obstetrics & Gynecology

## 2014-06-06 ENCOUNTER — Other Ambulatory Visit (HOSPITAL_COMMUNITY)
Admission: RE | Admit: 2014-06-06 | Discharge: 2014-06-06 | Disposition: A | Discharge status: Home / Self Care | Payer: MEDICAID | Source: Ambulatory Visit | Attending: Obstetrics & Gynecology | Admitting: Obstetrics & Gynecology

## 2014-06-06 VITALS — BP 149/69 | HR 79 | Temp 98.7°F | Ht 64.0 in | Wt 208.4 lb

## 2014-06-06 DIAGNOSIS — N942 Vaginismus: Secondary | ICD-10-CM

## 2014-06-06 DIAGNOSIS — R87610 Atypical squamous cells of undetermined significance on cytologic smear of cervix (ASC-US): Secondary | ICD-10-CM

## 2014-06-06 DIAGNOSIS — R87619 Unspecified abnormal cytological findings in specimens from cervix uteri: Secondary | ICD-10-CM | POA: Insufficient documentation

## 2014-06-06 DIAGNOSIS — R87810 Cervical high risk human papillomavirus (HPV) DNA test positive: Secondary | ICD-10-CM | POA: Insufficient documentation

## 2014-06-06 DIAGNOSIS — Z3202 Encounter for pregnancy test, result negative: Secondary | ICD-10-CM

## 2014-06-06 LAB — POCT PREGNANCY, URINE: PREG TEST UR: NEGATIVE

## 2014-06-06 NOTE — Patient Instructions (Signed)
Colposcopy Care After Colposcopy is a procedure in which a special tool is used to magnify the surface of the cervix. A tissue sample (biopsy) may also be taken. This sample will be looked at for cervical cancer or other problems. After the test:  You may have some cramping.  Lie down for a few minutes if you feel lightheaded.   You may have some bleeding which should stop in a few days. HOME CARE  Do not have sex or use tampons for 2 to 3 days or as told.  Only take medicine as told by your doctor.  Continue to take your birth control pills as usual. Finding out the results of your test Ask when your test results will be ready. Make sure you get your test results. GET HELP RIGHT AWAY IF:  You are bleeding a lot or are passing blood clots.  You develop a fever of 102 F (38.9 C) or higher.  You have abnormal vaginal discharge.  You have cramps that do not go away with medicine.  You feel lightheaded, dizzy, or pass out (faint). MAKE SURE YOU:   Understand these instructions.  Will watch your condition.  Will get help right away if you are not doing well or get worse. Document Released: 08/10/2007 Document Revised: 05/16/2011 Document Reviewed: 09/20/2012 ExitCare Patient Information 2015 [employer]. This information is not intended to replace advice given to you by your health care provider. Make sure you discuss any questions you have with your health care provider.  

## 2014-06-06 NOTE — Progress Notes (Signed)
Patient ID: Mariah Miller, female   DOB: Jan 23, 1976, 39 y.o.   MRN: [id number]  Chief Complaint  Patient presents with  . Colposcopy    HPI Mariah FinnerLatoria M Deadwyler is a 39 y.o. female.  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 Previous colposcopy: no  Prior cervical treatment: no treatment.  Past Medical History  Diagnosis Date  . Hypertension   . Diabetes mellitus without complication   . Migraine     Past Surgical History  Procedure Laterality Date  . Cholecystectomy    . Tonsillectomy    . Tubal ligation    . Cyst excision      Family History  Problem Relation Age of Onset  . Hypertension Mother   . Thyroid disease Mother   . Bipolar disorder Sister     Social History History  Substance Use Topics  . Smoking status: Never Smoker   . Smokeless tobacco: Not on file  . Alcohol Use: No    No Known Allergies  Current Outpatient Prescriptions  Medication Sig Dispense Refill  . aspirin EC 81 MG tablet Take 1 tablet (81 mg total) by mouth daily. 30 tablet 5  . ferrous sulfate 325 (65 FE) MG tablet Take 1 tablet (325 mg total) by mouth 2 (two) times daily with a meal. 180 tablet 3  . gabapentin (NEURONTIN) 300 MG capsule Take 1 capsule (300 mg total) by mouth 3 (three) times daily. 270 capsule 3  . insulin aspart (NOVOLOG) 100 UNIT/ML injection Inject 3-10 Units into the skin 3 (three) times daily as needed for high blood sugar. 150 - 200 = 3 units, 201 - 250 = 5 units, 251 - 300 = 7 units 301 - 350 = 10 units, > 350, call or come to clinic    . insulin NPH-regular Human (NOVOLIN 70/30) (70-30) 100 UNIT/ML injection Inject 20 Units into the skin 2 (two) times daily. 30 mL 3  . lisinopril-hydrochlorothiazide (PRINZIDE,ZESTORETIC) 20-12.5 MG per tablet Take 1 tablet by mouth daily. 90 tablet 3  . metFORMIN (GLUCOPHAGE) 1000 MG tablet Take 1 tablet (1,000 mg total) by mouth 2 (two) times daily with a meal.  180 tablet 3  . simvastatin (ZOCOR) 10 MG tablet Take 1 tablet (10 mg total) by mouth at bedtime. 90 tablet 3   No current facility-administered medications for this visit.    Review of Systems Review of Systems  Blood pressure 149/69, pulse 79, temperature 98.7 F (37.1 C), height 5\' 4"  (1.626 m), weight 208 lb 6.4 oz (94.53 kg), last menstrual period 05/11/2014.  Physical Exam Physical Exam  Data Reviewed Pap result  Assessment    Procedure Details  The risks and benefits of the procedure and Written informed consent obtained. Nabothian cysts, TX and SCJ seen mild AWE ant and posterior, no abnl vascular pattern Speculum placed in vagina and excellent visualization of cervix achieved, cervix swabbed x 3 with acetic acid solution.  Specimens: ECC, 1200 and 600 bx  Complications: none.     Plan    Specimens labelled and sent to Pathology. Return to discuss Pathology results in 2 weeks.      Keonta Alsip 06/06/2014, 11:29 AM

## 2014-06-10 ENCOUNTER — Telehealth: Payer: Self-pay

## 2014-06-10 NOTE — Telephone Encounter (Signed)
Attempted to contact patient. No answer. Left message stating we are calling with results, please call clinic and state whether or not it is OK to leave detailed message on voicemail.

## 2014-06-10 NOTE — Telephone Encounter (Signed)
Patient returned call and stated it is OK to leave detailed message on voicemail. Called patient. No answer. Left message informing her of results and recommendation to have repeat pap with HPV testing in one year, advised she call with any questions or concerns.

## 2014-06-10 NOTE — Telephone Encounter (Signed)
-----   Message from Adam PhenixJames G Arnold, MD sent at 06/09/2014  5:16 PM EDT ----- cotesting in 12 mo

## 2014-06-25 ENCOUNTER — Encounter: Payer: Self-pay | Admitting: *Deleted

## 2014-07-01 ENCOUNTER — Encounter: Payer: Self-pay | Admitting: *Deleted

## 2014-07-01 ENCOUNTER — Telehealth: Payer: Self-pay | Admitting: Internal Medicine

## 2014-07-01 NOTE — Telephone Encounter (Signed)
Patient asking for letter to her employer stating that she is diabetic and that she may need to carry candy in case her sugar drops.  According to patient, her office is requiring a letter in order for her to have candy on her person.  Letter done and patient to pick up.

## 2014-07-01 NOTE — Telephone Encounter (Signed)
Patient called to request a letter for her employeer stating that she has diabetes, she states that where she works she is not allowed to have candy on her but she needs to have something in case her sugar drops. Patient requests the letter before Thursday. Please f/u with pt.

## 2014-07-03 ENCOUNTER — Ambulatory Visit: Payer: No Typology Code available for payment source | Admitting: Obstetrics & Gynecology

## 2014-07-11 ENCOUNTER — Telehealth: Payer: Self-pay | Admitting: Internal Medicine

## 2014-07-11 NOTE — Telephone Encounter (Signed)
Patient came into office and picked up letter for work.

## 2014-07-27 ENCOUNTER — Emergency Department (INDEPENDENT_AMBULATORY_CARE_PROVIDER_SITE_OTHER)
Admission: EM | Admit: 2014-07-27 | Discharge: 2014-07-27 | Disposition: A | Discharge status: Home / Self Care | Payer: Self-pay | Source: Home / Self Care | Attending: Family Medicine | Admitting: Family Medicine

## 2014-07-27 ENCOUNTER — Encounter (HOSPITAL_COMMUNITY): Payer: Self-pay | Admitting: Emergency Medicine

## 2014-07-27 ENCOUNTER — Emergency Department (INDEPENDENT_AMBULATORY_CARE_PROVIDER_SITE_OTHER): Payer: No Typology Code available for payment source

## 2014-07-27 DIAGNOSIS — M25559 Pain in unspecified hip: Secondary | ICD-10-CM

## 2014-07-27 MED ORDER — TRAMADOL HCL 50 MG PO TABS: 50.0000 mg | ORAL_TABLET | Freq: Four times a day (QID) | ORAL | Status: DC | PRN

## 2014-07-27 NOTE — ED Provider Notes (Signed)
Mariah Miller is a 39 y.o. female who presents to Urgent Care today for right hip pain. Patient has a 2 day history of right hip pain occurring without injury. The pain is located in her groin and is worse with hip motion. No radiating pain. She notes that her leg gave way yesterday and she fell to the ground landing on her right lateral hip which is nontender. The pain in her preceded the fall. She denies any numbness bowel bladder dysfunction fevers or chills. She's tried ibuprofen BC and getting powder which helped only a little.   Past Medical History  Diagnosis Date  . Hypertension   . Diabetes mellitus without complication   . Migraine    Past Surgical History  Procedure Laterality Date  . Cholecystectomy    . Tonsillectomy    . Tubal ligation    . Cyst excision     History  Substance Use Topics  . Smoking status: Never Smoker   . Smokeless tobacco: Not on file  . Alcohol Use: No   ROS as above Medications: No current facility-administered medications for this encounter.   Current Outpatient Prescriptions  Medication Sig Dispense Refill  . aspirin EC 81 MG tablet Take 1 tablet (81 mg total) by mouth daily. 30 tablet 5  . ferrous sulfate 325 (65 FE) MG tablet Take 1 tablet (325 mg total) by mouth 2 (two) times daily with a meal. 180 tablet 3  . gabapentin (NEURONTIN) 300 MG capsule Take 1 capsule (300 mg total) by mouth 3 (three) times daily. 270 capsule 3  . insulin aspart (NOVOLOG) 100 UNIT/ML injection Inject 3-10 Units into the skin 3 (three) times daily as needed for high blood sugar. 150 - 200 = 3 units, 201 - 250 = 5 units, 251 - 300 = 7 units 301 - 350 = 10 units, > 350, call or come to clinic    . insulin NPH-regular Human (NOVOLIN 70/30) (70-30) 100 UNIT/ML injection Inject 20 Units into the skin 2 (two) times daily. 30 mL 3  . lisinopril-hydrochlorothiazide (PRINZIDE,ZESTORETIC) 20-12.5 MG per tablet Take 1 tablet by mouth daily. 90 tablet 3  . metFORMIN  (GLUCOPHAGE) 1000 MG tablet Take 1 tablet (1,000 mg total) by mouth 2 (two) times daily with a meal. 180 tablet 3  . simvastatin (ZOCOR) 10 MG tablet Take 1 tablet (10 mg total) by mouth at bedtime. 90 tablet 3  . traMADol (ULTRAM) 50 MG tablet Take 1 tablet (50 mg total) by mouth every 6 (six) hours as needed. 15 tablet 0   No Known Allergies   Exam:  BP 166/90 mmHg  Pulse 79  Temp(Src) 98.2 F (36.8 C) (Oral)  Resp 20  SpO2 100%  LMP 06/27/2014 Gen: Well NAD HEENT: EOMI,  MMM Lungs: Normal work of breathing. CTABL Heart: RRR no MRG Abd: NABS, Soft. Nondistended, Nontender Exts: Brisk capillary refill, warm and well perfused.  Hips Right nontender. Limited rotational range of motion by pain especially with internal rotation. Pain with flexion. Left hip nontender normal range of motion  No results found for this or any previous visit (from the past 24 hour(s)). Dg Hip Unilat With Pelvis 2-3 Views Right  07/27/2014   CLINICAL DATA:  Post fall on Saturday, now with persistent intermittent right-sided hip pain. Initial encounter.  EXAM: RIGHT HIP (WITH PELVIS) 2-3 VIEWS  COMPARISON:  None.  FINDINGS: No fracture or dislocation. Right hip joint spaces are preserved. No evidence avascular necrosis. Regional soft tissues appear normal.  No radiopaque foreign body.  Limited visualization of the bilateral SI joints, pubic symphysis and contralateral left hip is normal.  IMPRESSION: No explanation for patient's intermittent right hip pain.   Electronically Signed   By: Simonne Come M.D.   On: 07/27/2014 18:01    Assessment and Plan: 39 y.o. female with right hip pain. Unclear etiology possibly labrum injury. Physical exam and history suggest a femoral acetabular source of pain. Plan for watchful waiting and treat with tramadol. Avoid high dose NSAIDs due to diabetes and hypertension. Follow-up with sports medicine.  Discussed warning signs or symptoms. Please see discharge instructions. Patient  expresses understanding.     Rodolph Bong, MD 07/27/14 Rickey Primus

## 2014-07-27 NOTE — Discharge Instructions (Signed)
Thank you for coming in today. Use tramadol for severe pain. Do not drive after taking this medicine. Follow-up with sports medicine Center. Please call or see Ms Antionette CharMaggy Mena for assistance with your bill.  You may qualify for reduced or free services.  Her phone number is [phone]. Her email is [email or web address]  Hip Pain Your hip is the joint between your upper legs and your lower pelvis. The bones, cartilage, tendons, and muscles of your hip joint perform a lot of work each day supporting your body weight and allowing you to move around. Hip pain can range from a minor ache to severe pain in one or both of your hips. Pain may be felt on the inside of the hip joint near the groin, or the outside near the buttocks and upper thigh. You may have swelling or stiffness as well.  HOME CARE INSTRUCTIONS   Take medicines only as directed by your health care provider.  Apply ice to the injured area:  Put ice in a plastic bag.  Place a towel between your skin and the bag.  Leave the ice on for 15-20 minutes at a time, 3-4 times a day.  Keep your leg raised (elevated) when possible to lessen swelling.  Avoid activities that cause pain.  Follow specific exercises as directed by your health care provider.  Sleep with a pillow between your legs on your most comfortable side.  Record how often you have hip pain, the location of the pain, and what it feels like. SEEK MEDICAL CARE IF:   You are unable to put weight on your leg.  Your hip is red or swollen or very tender to touch.  Your pain or swelling continues or worsens after 1 week.  You have increasing difficulty walking.  You have a fever. SEEK IMMEDIATE MEDICAL CARE IF:   You have fallen.  You have a sudden increase in pain and swelling in your hip. MAKE SURE YOU:   Understand these instructions.  Will watch your condition.  Will get help right away if you are not doing well or get worse. Document  Released: 08/11/2009 Document Revised: 07/08/2013 Document Reviewed: 10/18/2012 [facility] Patient Information 2015 [employer], [place]. This information is not intended to replace advice given to you by your health care provider. Make sure you discuss any questions you have with your health care provider.

## 2014-07-27 NOTE — ED Notes (Signed)
Right hip pain, no known injury.onset of symptoms Saturday.

## 2014-08-05 ENCOUNTER — Emergency Department (HOSPITAL_COMMUNITY)
Admission: EM | Admit: 2014-08-05 | Discharge: 2014-08-05 | Disposition: A | Discharge status: Home / Self Care | Payer: No Typology Code available for payment source | Attending: Emergency Medicine | Admitting: Emergency Medicine

## 2014-08-05 ENCOUNTER — Encounter (HOSPITAL_COMMUNITY): Payer: Self-pay | Admitting: Nurse Practitioner

## 2014-08-05 DIAGNOSIS — Z79899 Other long term (current) drug therapy: Secondary | ICD-10-CM | POA: Insufficient documentation

## 2014-08-05 DIAGNOSIS — E119 Type 2 diabetes mellitus without complications: Secondary | ICD-10-CM | POA: Insufficient documentation

## 2014-08-05 DIAGNOSIS — Z7982 Long term (current) use of aspirin: Secondary | ICD-10-CM | POA: Insufficient documentation

## 2014-08-05 DIAGNOSIS — G40909 Epilepsy, unspecified, not intractable, without status epilepticus: Secondary | ICD-10-CM | POA: Insufficient documentation

## 2014-08-05 DIAGNOSIS — I10 Essential (primary) hypertension: Secondary | ICD-10-CM | POA: Insufficient documentation

## 2014-08-05 DIAGNOSIS — Z794 Long term (current) use of insulin: Secondary | ICD-10-CM | POA: Insufficient documentation

## 2014-08-05 DIAGNOSIS — G43809 Other migraine, not intractable, without status migrainosus: Secondary | ICD-10-CM | POA: Insufficient documentation

## 2014-08-05 HISTORY — DX: Unspecified convulsions: R56.9

## 2014-08-05 MED ORDER — LORAZEPAM 2 MG/ML IJ SOLN
1.0000 mg | Freq: Once | INTRAMUSCULAR | Status: AC
  Administered 2014-08-05: 1 mg via INTRAVENOUS
  Filled 2014-08-05: qty 1

## 2014-08-05 MED ORDER — SODIUM CHLORIDE 0.9 % IV BOLUS (SEPSIS)
500.0000 mL | Freq: Once | INTRAVENOUS | Status: AC
  Administered 2014-08-05: 500 mL via INTRAVENOUS

## 2014-08-05 MED ORDER — PROCHLORPERAZINE EDISYLATE 5 MG/ML IJ SOLN
10.0000 mg | Freq: Once | INTRAMUSCULAR | Status: AC
  Administered 2014-08-05: 10 mg via INTRAVENOUS
  Filled 2014-08-05: qty 2

## 2014-08-05 MED ORDER — KETOROLAC TROMETHAMINE 30 MG/ML IJ SOLN
30.0000 mg | Freq: Once | INTRAMUSCULAR | Status: AC
  Administered 2014-08-05: 30 mg via INTRAVENOUS
  Filled 2014-08-05: qty 1

## 2014-08-05 NOTE — ED Notes (Signed)
Per EMS pt called out EMS for left sided headaches similar to past headaches that were followed by seizures. No seizure activity noted by EMS. Stroke screen negative, no unilateral weakness, blurry vision. Pt endorses generalized weakness and headache that started today at 3pm with one episode of emesis. Patient in NAD at present time- started EMS in hallway. Pt NSR, EKG- WNL.

## 2014-08-05 NOTE — Discharge Instructions (Signed)

## 2014-08-05 NOTE — ED Provider Notes (Signed)
CSN: [id number]     Arrival date & time 08/05/14  1617 History   First MD Initiated Contact with Patient 08/05/14 1633     Chief Complaint  Patient presents with  . Headache     (Consider location/radiation/quality/duration/timing/severity/associated sxs/prior Treatment) HPI Comments: Patient presents to the ER for evaluation of headache. Patient reports that she had onset of a headache on the left side of her head behind her left eye earlier today. Pain has migrated to the posterior aspect of her head on the left side. Patient has had migraine headaches with similar symptoms to this in the past. She does not feel that this headache is unusual in any way. She has, however, in the past had seizure associated with this type of headache. She has not, however, on antiseizure medication. She reports her doctor told her the seizures were from "stress".  Patient did not have a seizure today. Headache is moderate to severe, constant on the left side of her head, similar to previous headaches.  Patient is a 39 y.o. female presenting with headaches.  Headache   Past Medical History  Diagnosis Date  . Hypertension   . Diabetes mellitus without complication   . Migraine   . Seizures     self reported   Past Surgical History  Procedure Laterality Date  . Cholecystectomy    . Tonsillectomy    . Tubal ligation    . Cyst excision    . Shoulder open rotator cuff repair Left    Family History  Problem Relation Age of Onset  . Hypertension Mother   . Thyroid disease Mother   . Bipolar disorder Sister    History  Substance Use Topics  . Smoking status: Never Smoker   . Smokeless tobacco: Not on file  . Alcohol Use: No   OB History    Gravida Para Term Preterm AB TAB SAB Ectopic Multiple Living   Review of Systems  Neurological: Positive for headaches.  All other systems reviewed and are negative.     Allergies  Review of patient's allergies indicates no  known allergies.  Home Medications   Prior to Admission medications   Medication Sig Start Date End Date Taking? Authorizing Provider  aspirin EC 81 MG tablet Take 1 tablet (81 mg total) by mouth daily. 01/22/13  Yes Nishant Dhungel, MD  ferrous sulfate 325 (65 FE) MG tablet Take 1 tablet (325 mg total) by mouth 2 (two) times daily with a meal. 03/27/14  Yes Olugbemiga E Hyman Hopes, MD  gabapentin (NEURONTIN) 300 MG capsule Take 1 capsule (300 mg total) by mouth 3 (three) times daily. 03/17/14  Yes Olugbemiga Annitta Needs, MD  insulin aspart (NOVOLOG) 100 UNIT/ML injection Inject 3-10 Units into the skin 3 (three) times daily as needed for high blood sugar. 150 - 200 = 3 units, 201 - 250 = 5 units, 251 - 300 = 7 units 301 - 350 = 10 units, > 350, call or come to clinic 06/20/13  Yes Olugbemiga E Hyman Hopes, MD  insulin NPH-regular Human (NOVOLIN 70/30) (70-30) 100 UNIT/ML injection Inject 20 Units into the skin 2 (two) times daily. 03/27/14  Yes Quentin Angst, MD  lisinopril-hydrochlorothiazide (PRINZIDE,ZESTORETIC) 20-12.5 MG per tablet Take 1 tablet by mouth daily. 03/17/14  Yes Quentin Angst, MD  metFORMIN (GLUCOPHAGE) 1000 MG tablet Take 1 tablet (1,000 mg total) by mouth 2 (two) times daily with a meal.  03/17/14  Yes Quentin Angstlugbemiga E Jegede, MD  simvastatin (ZOCOR) 10 MG tablet Take 1 tablet (10 mg total) by mouth at bedtime. 03/17/14  Yes Olugbemiga Annitta NeedsE Jegede, MD  traMADol (ULTRAM) 50 MG tablet Take 1 tablet (50 mg total) by mouth every 6 (six) hours as needed. Patient not taking: Reported on 08/05/2014 07/27/14   Rodolph BongEvan S Corey, MD   BP 126/73 mmHg  Pulse 77  Temp(Src) 99 F (37.2 C) (Oral)  Resp 17  Ht 5\' 4"  (1.626 m)  Wt 208 lb (94.348 kg)  BMI 35.69 kg/m2  SpO2 100%  LMP 07/29/2014 Physical Exam  Constitutional: She is oriented to person, place, and time. She appears well-developed and well-nourished. No distress.  HENT:  Head: Normocephalic and atraumatic.  Right Ear: Hearing normal.  Left  Ear: Hearing normal.  Nose: Nose normal.  Mouth/Throat: Oropharynx is clear and moist and mucous membranes are normal.  Eyes: Conjunctivae and EOM are normal. Pupils are equal, round, and reactive to light.  Neck: Normal range of motion. Neck supple.  Cardiovascular: Regular rhythm, S1 normal and S2 normal.  Exam reveals no gallop and no friction rub.   No murmur heard. Pulmonary/Chest: Effort normal and breath sounds normal. No respiratory distress. She exhibits no tenderness.  Abdominal: Soft. Normal appearance and bowel sounds are normal. There is no hepatosplenomegaly. There is no tenderness. There is no rebound, no guarding, no tenderness at McBurney's point and negative Murphy's sign. No hernia.  Musculoskeletal: Normal range of motion.  Neurological: She is alert and oriented to person, place, and time. She has normal strength. No cranial nerve deficit or sensory deficit. Coordination normal. GCS eye subscore is 4. GCS verbal subscore is 5. GCS motor subscore is 6.  Extraocular muscle movement: normal No visual field cut Pupils: equal and reactive both direct and consensual response is normal No nystagmus present    Sensory function is intact to light touch, pinprick Proprioception intact  Grip strength 5/5 symmetric in upper extremities No pronator drift Normal finger to nose bilaterally  Lower extremity strength 5/5 against gravity Normal heel to shin bilaterally  Gait: normal   Skin: Skin is warm, dry and intact. No rash noted. No cyanosis.  Psychiatric: She has a normal mood and affect. Her speech is normal and behavior is normal. Thought content normal.  Nursing note and vitals reviewed.   ED Course  Procedures (including critical care time) Labs Review Labs Reviewed - No data to display  Imaging Review No results found.   EKG Interpretation None      MDM   Final diagnoses:  None  migraine   Patient presents to the ER for evaluation of headache.  Patient does have a history of migraine headaches. She does report that the headache today is similar to previous migraines that she has had. There are no unusual features. Her neurologic examination was normal. She did not require any imaging. Patient does report that she has had seizures with her headaches in the past. She is not on any seizure medication. It's not clear what her true diagnosis is, or even if her seizures were actually seizures versus nonepileptic episodes. She did not have a seizure today. She was given Ativan to prevent any seizure type activity. She was also treated for migraine. Patient will be discharged, follow-up with neurology for further management.    Gilda Creasehristopher J Pollina, MD 08/05/14 [phone]

## 2014-12-23 ENCOUNTER — Emergency Department (HOSPITAL_COMMUNITY): Payer: No Typology Code available for payment source

## 2014-12-23 ENCOUNTER — Emergency Department (HOSPITAL_COMMUNITY)
Admission: EM | Admit: 2014-12-23 | Discharge: 2014-12-23 | Disposition: A | Discharge status: Home / Self Care | Payer: No Typology Code available for payment source | Attending: Emergency Medicine | Admitting: Emergency Medicine

## 2014-12-23 ENCOUNTER — Encounter (HOSPITAL_COMMUNITY): Payer: Self-pay

## 2014-12-23 DIAGNOSIS — R079 Chest pain, unspecified: Secondary | ICD-10-CM

## 2014-12-23 DIAGNOSIS — R0602 Shortness of breath: Secondary | ICD-10-CM | POA: Insufficient documentation

## 2014-12-23 DIAGNOSIS — Z79899 Other long term (current) drug therapy: Secondary | ICD-10-CM | POA: Insufficient documentation

## 2014-12-23 DIAGNOSIS — Z7982 Long term (current) use of aspirin: Secondary | ICD-10-CM | POA: Insufficient documentation

## 2014-12-23 DIAGNOSIS — Z794 Long term (current) use of insulin: Secondary | ICD-10-CM | POA: Insufficient documentation

## 2014-12-23 DIAGNOSIS — G40909 Epilepsy, unspecified, not intractable, without status epilepticus: Secondary | ICD-10-CM | POA: Insufficient documentation

## 2014-12-23 DIAGNOSIS — R0789 Other chest pain: Secondary | ICD-10-CM | POA: Insufficient documentation

## 2014-12-23 DIAGNOSIS — E1165 Type 2 diabetes mellitus with hyperglycemia: Secondary | ICD-10-CM | POA: Insufficient documentation

## 2014-12-23 DIAGNOSIS — I10 Essential (primary) hypertension: Secondary | ICD-10-CM | POA: Insufficient documentation

## 2014-12-23 DIAGNOSIS — G43909 Migraine, unspecified, not intractable, without status migrainosus: Secondary | ICD-10-CM | POA: Insufficient documentation

## 2014-12-23 DIAGNOSIS — E7800 Pure hypercholesterolemia, unspecified: Secondary | ICD-10-CM | POA: Insufficient documentation

## 2014-12-23 DIAGNOSIS — R739 Hyperglycemia, unspecified: Secondary | ICD-10-CM

## 2014-12-23 LAB — CBC
HCT: 34.4 % — ABNORMAL LOW (ref 36.0–46.0)
Hemoglobin: 11.2 g/dL — ABNORMAL LOW (ref 12.0–15.0)
MCH: 28 pg (ref 26.0–34.0)
MCHC: 32.6 g/dL (ref 30.0–36.0)
MCV: 86 fL (ref 78.0–100.0)
Platelets: 317 10*3/uL (ref 150–400)
RBC: 4 MIL/uL (ref 3.87–5.11)
RDW: 14.1 % (ref 11.5–15.5)
WBC: 6.5 10*3/uL (ref 4.0–10.5)

## 2014-12-23 LAB — BASIC METABOLIC PANEL
ANION GAP: 9 (ref 5–15)
BUN: 7 mg/dL (ref 6–20)
CHLORIDE: 102 mmol/L (ref 101–111)
CO2: 24 mmol/L (ref 22–32)
Calcium: 9.4 mg/dL (ref 8.9–10.3)
Creatinine, Ser: 0.8 mg/dL (ref 0.44–1.00)
GFR calc Af Amer: >60 mL/min (ref >60)
GFR calc non Af Amer: >60 mL/min (ref >60)
Glucose, Bld: 339 mg/dL — ABNORMAL HIGH (ref 65–99)
Potassium: 3.9 mmol/L (ref 3.5–5.1)
Sodium: 135 mmol/L (ref 135–145)

## 2014-12-23 LAB — CBG MONITORING, ED: Glucose-Capillary: 255 mg/dL — ABNORMAL HIGH (ref 65–99)

## 2014-12-23 LAB — I-STAT TROPONIN, ED: Troponin i, poc: 0 ng/mL (ref 0.00–0.08)

## 2014-12-23 MED ORDER — SODIUM CHLORIDE 0.9 % IV BOLUS (SEPSIS)
1000.0000 mL | Freq: Once | INTRAVENOUS | Status: AC
  Administered 2014-12-23: 1000 mL via INTRAVENOUS

## 2014-12-23 NOTE — ED Notes (Signed)
Pt reports gradually worsening "sharp" left sided CP since last night. Pt reports she was checked by EMS last night for the same. Pain worse with palpation. Intermittent SOB. NAD.

## 2014-12-23 NOTE — ED Provider Notes (Signed)
CSN: [id number]     Arrival date & time 12/23/14  1147 History   First MD Initiated Contact with Patient 12/23/14 1318     Chief Complaint  Patient presents with  . Chest Pain     (Consider location/radiation/quality/duration/timing/severity/associated sxs/prior Treatment) HPI Comments: Patient with history of diabetes, high cholesterol, high blood sugar, former smoker -- presents with complaint of acute onset of sharp left sided chest pain beginning at 2 AM while she was at work. Pain came on very quickly and was very intense for several minutes, associated with shortness of breath. EMS was called and patient was released. She went home. She has had mild residual pain since that time. She reports shortness of breath that comes and goes. Patient does not do any heavy lifting at her job but does do repetitive motions. Patient denies risk factors for pulmonary embolism including: unilateral leg swelling, history of DVT/PE/other blood clots, use of estrogens, recent immobilizations, recent surgery, recent travel (>4hr segment), malignancy, hemoptysis.   Patient does report that her blood sugar has been elevated recently. She reports increased urination.    The history is provided by the patient.    Past Medical History  Diagnosis Date  . Hypertension   . Diabetes mellitus without complication (HCC)   . Migraine   . Seizures (HCC)     self reported   Past Surgical History  Procedure Laterality Date  . Cholecystectomy    . Tonsillectomy    . Tubal ligation    . Cyst excision    . Shoulder open rotator cuff repair Left    Family History  Problem Relation Age of Onset  . Hypertension Mother   . Thyroid disease Mother   . Bipolar disorder Sister    Social History  Substance Use Topics  . Smoking status: Never Smoker   . Smokeless tobacco: None  . Alcohol Use: No   OB History    Gravida Para Term Preterm AB TAB SAB Ectopic Multiple Living   6 3 3  3  2   3      Review of  Systems  Constitutional: Negative for fever and diaphoresis.  Eyes: Negative for redness.  Respiratory: Negative for cough and shortness of breath.   Cardiovascular: Positive for chest pain. Negative for palpitations and leg swelling.  Gastrointestinal: Negative for nausea, vomiting and abdominal pain.  Genitourinary: Negative for dysuria.  Musculoskeletal: Negative for back pain and neck pain.  Skin: Negative for rash.  Neurological: Negative for syncope and light-headedness.  Psychiatric/Behavioral: The patient is not nervous/anxious.     Allergies  Review of patient's allergies indicates no known allergies.  Home Medications   Prior to Admission medications   Medication Sig Start Date End Date Taking? Authorizing Provider  aspirin EC 81 MG tablet Take 1 tablet (81 mg total) by mouth daily. 01/22/13  Yes Nishant Dhungel, MD  ferrous sulfate 325 (65 FE) MG tablet Take 1 tablet (325 mg total) by mouth 2 (two) times daily with a meal. 03/27/14  Yes Olugbemiga E Hyman HopesJegede, MD  gabapentin (NEURONTIN) 300 MG capsule Take 1 capsule (300 mg total) by mouth 3 (three) times daily. 03/17/14  Yes Olugbemiga Annitta NeedsE Jegede, MD  insulin aspart (NOVOLOG) 100 UNIT/ML injection Inject 3-10 Units into the skin 3 (three) times daily as needed for high blood sugar. 150 - 200 = 3 units, 201 - 250 = 5 units, 251 - 300 = 7 units 301 - 350 = 10 units, > 350, call or come  to clinic 06/20/13  Yes Quentin Angst, MD  insulin NPH-regular Human (NOVOLIN 70/30) (70-30) 100 UNIT/ML injection Inject 20 Units into the skin 2 (two) times daily. 03/27/14  Yes Quentin Angst, MD  lisinopril-hydrochlorothiazide (PRINZIDE,ZESTORETIC) 20-12.5 MG per tablet Take 1 tablet by mouth daily. 03/17/14  Yes Quentin Angst, MD  metFORMIN (GLUCOPHAGE) 1000 MG tablet Take 1 tablet (1,000 mg total) by mouth 2 (two) times daily with a meal. 03/17/14  Yes Quentin Angst, MD  simvastatin (ZOCOR) 10 MG tablet Take 1 tablet (10 mg total)  by mouth at bedtime. 03/17/14  Yes Olugbemiga Annitta Needs, MD  traMADol (ULTRAM) 50 MG tablet Take 1 tablet (50 mg total) by mouth every 6 (six) hours as needed. 07/27/14  Yes Rodolph Bong, MD   BP 167/82 mmHg  Pulse 81  Temp(Src) 98.3 F (36.8 C) (Oral)  Ht  (1.626 m)  SpO2 96%  LMP 12/02/2014   Physical Exam  Constitutional: She appears well-developed and well-nourished.  HENT:  Head: Normocephalic and atraumatic.  Mouth/Throat: Mucous membranes are dry.  Eyes: Conjunctivae are normal. Right eye exhibits no discharge. Left eye exhibits no discharge.  Neck: Normal range of motion. Neck supple.  Cardiovascular: Normal rate, regular rhythm and normal heart sounds.   No murmur heard. Pulmonary/Chest: Effort normal and breath sounds normal. No respiratory distress. She has no wheezes. She has no rales.  Abdominal: Soft. There is no tenderness. There is no rebound and no guarding.  Musculoskeletal: She exhibits no edema or tenderness.  No clinical findings of DVT.  Neurological: She is alert.  Skin: Skin is warm and dry.  Psychiatric: She has a normal mood and affect.  Nursing note and vitals reviewed.   ED Course  Procedures (including critical care time) Labs Review Labs Reviewed  BASIC METABOLIC PANEL - Abnormal; Notable for the following:    Glucose, Bld 339 (*)    All other components within normal limits  CBC - Abnormal; Notable for the following:    Hemoglobin 11.2 (*)    HCT 34.4 (*)    All other components within normal limits  CBG MONITORING, ED - Abnormal; Notable for the following:    Glucose-Capillary 255 (*)    All other components within normal limits  I-STAT TROPOININ, ED    Imaging Review Dg Chest 2 View  12/23/2014  CLINICAL DATA:  Chest pain starting at 2 a.m.  Shortness of breath. EXAM: CHEST  2 VIEW COMPARISON:  12/20/2013 FINDINGS: The lungs appear clear.  Cardiac and mediastinal contours normal. No pleural effusion identified. Mild to moderate  thoracic spondylosis.  Cholecystectomy clips noted. IMPRESSION: 1. No acute thoracic findings. 2. Mild to moderate thoracic spondylosis. Electronically Signed   By: Gaylyn Rong M.D.   On: 12/23/2014 13:26   I have personally reviewed and evaluated these images and lab results as part of my medical decision-making.   EKG Interpretation   Date/Time:  Tuesday December 23 2014 11:50:17 EDT Ventricular Rate:  86 PR Interval:  136 QRS Duration: 90 QT Interval:  362 QTC Calculation: 433 R Axis:   58 Text Interpretation:  Poor data quality, interpretation may be  adversely affected Normal sinus rhythm Normal ECG Confirmed by COOK  MD,  BRIAN ([id number]) on 12/23/2014 4:48:20 PM      1:54 PM Patient seen and examined. Workup findings reviewed with patient. Blood testing and chest x-ray are reassuring. Given elevated blood sugar and dry mucous membranes, will hydrate with 1 L  IV fluids.   Vital signs reviewed and are as follows: BP 167/82 mmHg  Pulse 81  Temp(Src) 98.3 F (36.8 C) (Oral)  Ht  (1.626 m)  SpO2 96%  LMP 12/02/2014  Patient feels much better after IV hydration. Blood sugar decreased into the 200s. Will discharge to home.  MDM   Final diagnoses:  Chest pain, unspecified chest pain type  Hyperglycemia   CP: Patient with chest tightness. Feel patient is low risk for ACS given history (poor story for ACS/MI), negative troponin(s), non-ischemic EKG.   Hyperglycemia: Note evidence of DKA. Treated with IV fluids with good response.    Renne Crigler, PA-C 12/23/14 2029  Melene Plan, DO 12/24/14 [phone]

## 2014-12-23 NOTE — Discharge Instructions (Signed)
Please read and follow all provided instructions.  Your diagnoses today include:  1. Chest pain, unspecified chest pain type   2. Hyperglycemia    Tests performed today include:  An EKG of your heart  A chest x-ray  Cardiac enzymes - a blood test for heart muscle damage  Blood counts and electrolytes - shows high blood sugar  Vital signs. See below for your results today.   Medications prescribed:   None  Take any prescribed medications only as directed.  Follow-up instructions: Please follow-up with your primary care provider as soon as you can for further evaluation of your symptoms.   Return instructions:  SEEK IMMEDIATE MEDICAL ATTENTION IF:  You have severe chest pain, especially if the pain is crushing or pressure-like and spreads to the arms, back, neck, or jaw, or if you have sweating, nausea (feeling sick to your stomach), or shortness of breath. THIS IS AN EMERGENCY. Don't wait to see if the pain will go away. Get medical help at once. Call 911 or 0 (operator). DO NOT drive yourself to the hospital.   Your chest pain gets worse and does not go away with rest.   You have an attack of chest pain lasting longer than usual, despite rest and treatment with the medications your caregiver has prescribed.   You wake from sleep with chest pain or shortness of breath.  You feel dizzy or faint.  You have chest pain not typical of your usual pain for which you originally saw your caregiver.   You have any other emergent concerns regarding your health.  Additional Information: Chest pain comes from many different causes. Your caregiver has diagnosed you as having chest pain that is not specific for one problem, but does not require admission.  You are at low risk for an acute heart condition or other serious illness.   Your vital signs today were: BP 158/82 mmHg   Pulse 79   Temp(Src) 98.3 F (36.8 C) (Oral)   Resp 14   Ht 5\' 4"  (1.626 m)   SpO2 100%   LMP 12/02/2014 If  your blood pressure (BP) was elevated above 135/85 this visit, please have this repeated by your doctor within one month. --------------

## 2015-01-01 ENCOUNTER — Ambulatory Visit: Payer: No Typology Code available for payment source | Admitting: Internal Medicine

## 2015-02-05 ENCOUNTER — Emergency Department (HOSPITAL_COMMUNITY)
Admission: EM | Admit: 2015-02-05 | Discharge: 2015-02-06 | Disposition: A | Discharge status: Home / Self Care | Payer: No Typology Code available for payment source | Attending: Emergency Medicine | Admitting: Emergency Medicine

## 2015-02-05 ENCOUNTER — Encounter (HOSPITAL_COMMUNITY): Payer: Self-pay

## 2015-02-05 DIAGNOSIS — Z794 Long term (current) use of insulin: Secondary | ICD-10-CM | POA: Insufficient documentation

## 2015-02-05 DIAGNOSIS — Z79899 Other long term (current) drug therapy: Secondary | ICD-10-CM | POA: Insufficient documentation

## 2015-02-05 DIAGNOSIS — Z7982 Long term (current) use of aspirin: Secondary | ICD-10-CM | POA: Insufficient documentation

## 2015-02-05 DIAGNOSIS — G43909 Migraine, unspecified, not intractable, without status migrainosus: Secondary | ICD-10-CM | POA: Insufficient documentation

## 2015-02-05 DIAGNOSIS — R0602 Shortness of breath: Secondary | ICD-10-CM | POA: Insufficient documentation

## 2015-02-05 DIAGNOSIS — R110 Nausea: Secondary | ICD-10-CM | POA: Insufficient documentation

## 2015-02-05 DIAGNOSIS — E1165 Type 2 diabetes mellitus with hyperglycemia: Secondary | ICD-10-CM | POA: Insufficient documentation

## 2015-02-05 DIAGNOSIS — Z7984 Long term (current) use of oral hypoglycemic drugs: Secondary | ICD-10-CM | POA: Insufficient documentation

## 2015-02-05 DIAGNOSIS — R200 Anesthesia of skin: Secondary | ICD-10-CM | POA: Insufficient documentation

## 2015-02-05 DIAGNOSIS — I10 Essential (primary) hypertension: Secondary | ICD-10-CM | POA: Insufficient documentation

## 2015-02-05 DIAGNOSIS — R079 Chest pain, unspecified: Secondary | ICD-10-CM | POA: Insufficient documentation

## 2015-02-05 LAB — CBC WITH DIFFERENTIAL/PLATELET
BASOS ABS: 0.1 10*3/uL (ref 0.0–0.1)
BASOS PCT: 1 %
EOS ABS: 0.2 10*3/uL (ref 0.0–0.7)
Eosinophils Relative: 4 %
HCT: 34.2 % — ABNORMAL LOW (ref 36.0–46.0)
Hemoglobin: 11.3 g/dL — ABNORMAL LOW (ref 12.0–15.0)
LYMPHS ABS: 3.2 10*3/uL (ref 0.7–4.0)
LYMPHS PCT: 53 %
MCH: 28.7 pg (ref 26.0–34.0)
MCHC: 33 g/dL (ref 30.0–36.0)
MCV: 86.8 fL (ref 78.0–100.0)
MONO ABS: 0.5 10*3/uL (ref 0.1–1.0)
Monocytes Relative: 8 %
NEUTROS ABS: 2.1 10*3/uL (ref 1.7–7.7)
Neutrophils Relative %: 34 %
PLATELETS: 336 10*3/uL (ref 150–400)
RBC: 3.94 MIL/uL (ref 3.87–5.11)
RDW: 13.7 % (ref 11.5–15.5)
WBC: 6.1 10*3/uL (ref 4.0–10.5)

## 2015-02-05 LAB — BASIC METABOLIC PANEL
Anion gap: 8 (ref 5–15)
BUN: 6 mg/dL (ref 6–20)
CALCIUM: 8.8 mg/dL — AB (ref 8.9–10.3)
CO2: 20 mmol/L — ABNORMAL LOW (ref 22–32)
CREATININE: 0.82 mg/dL (ref 0.44–1.00)
Chloride: 105 mmol/L (ref 101–111)
GFR calc Af Amer: >60 mL/min (ref >60)
GLUCOSE: 390 mg/dL — AB (ref 65–99)
Potassium: 4.2 mmol/L (ref 3.5–5.1)
Sodium: 133 mmol/L — ABNORMAL LOW (ref 135–145)

## 2015-02-05 LAB — TROPONIN I: TROPONIN I: 0.03 ng/mL (ref <0.031)

## 2015-02-05 MED ORDER — ACETAMINOPHEN 325 MG PO TABS
650.0000 mg | ORAL_TABLET | Freq: Once | ORAL | Status: AC
  Administered 2015-02-05: 650 mg via ORAL
  Filled 2015-02-05: qty 2

## 2015-02-05 NOTE — ED Provider Notes (Signed)
CSN: [id number]     Arrival date & time 02/05/15  2159 History   First MD Initiated Contact with Patient 02/05/15 2212     Chief Complaint  Patient presents with  . Chest Pain  . Numbness     (Consider location/radiation/quality/duration/timing/severity/associated sxs/prior Treatment) HPI Comments: Patient with a history of Type 2 DM, HTN, migraines, seizures, presents with complaint of left sided chest pain that woke her from sleep at 5:00 pm yesterday. It has been constant since it started. It radiates into the left arm causing tingling of the hand and is associated with mild SOB. She had a similar episode 3 months ago and was seen in the emergency department and reports a negative cardiac evaluation. No alleviating or aggravating factors. She took nothing prior to arrival to help symptoms.   Patient is a 39 y.o. female presenting with chest pain. The history is provided by the patient. No language interpreter was used.  Chest Pain Associated symptoms: nausea and shortness of breath   Associated symptoms: no fever and not vomiting     Past Medical History  Diagnosis Date  . Hypertension   . Diabetes mellitus without complication (HCC)   . Migraine   . Seizures (HCC)     self reported   Past Surgical History  Procedure Laterality Date  . Cholecystectomy    . Tonsillectomy    . Tubal ligation    . Cyst excision    . Shoulder open rotator cuff repair Left    Family History  Problem Relation Age of Onset  . Hypertension Mother   . Thyroid disease Mother   . Bipolar disorder Sister    Social History  Substance Use Topics  . Smoking status: Never Smoker   . Smokeless tobacco: None  . Alcohol Use: No   OB History    Gravida Para Term Preterm AB TAB SAB Ectopic Multiple Living   Review of Systems  Constitutional: Negative for fever and chills.  Respiratory: Positive for shortness of breath.   Cardiovascular: Positive for chest pain.   Gastrointestinal: Positive for nausea. Negative for vomiting.  Musculoskeletal: Negative.   Skin: Negative.   Neurological: Negative.        Tingling of left UE.      Allergies  Review of patient's allergies indicates no known allergies.  Home Medications   Prior to Admission medications   Medication Sig Start Date End Date Taking? Authorizing Provider  aspirin EC 81 MG tablet Take 1 tablet (81 mg total) by mouth daily. 01/22/13  Yes Nishant Dhungel, MD  ferrous sulfate 325 (65 FE) MG tablet Take 1 tablet (325 mg total) by mouth 2 (two) times daily with a meal. 03/27/14  Yes Olugbemiga E Hyman Hopes, MD  gabapentin (NEURONTIN) 300 MG capsule Take 1 capsule (300 mg total) by mouth 3 (three) times daily. 03/17/14  Yes Olugbemiga Annitta Needs, MD  insulin aspart (NOVOLOG) 100 UNIT/ML injection Inject 3-10 Units into the skin 3 (three) times daily as needed for high blood sugar. 150 - 200 = 3 units, 201 - 250 = 5 units, 251 - 300 = 7 units 301 - 350 = 10 units, > 350, call or come to clinic 06/20/13  Yes Olugbemiga E Hyman Hopes, MD  insulin NPH-regular Human (NOVOLIN 70/30) (70-30) 100 UNIT/ML injection Inject 20 Units into the skin 2 (two) times daily. 03/27/14  Yes Quentin Angst, MD  lisinopril-hydrochlorothiazide (PRINZIDE,ZESTORETIC) 20-12.5  MG per tablet Take 1 tablet by mouth daily. 03/17/14  Yes Quentin Angstlugbemiga E Jegede, MD  metFORMIN (GLUCOPHAGE) 1000 MG tablet Take 1 tablet (1,000 mg total) by mouth 2 (two) times daily with a meal. 03/17/14  Yes Quentin Angstlugbemiga E Jegede, MD  simvastatin (ZOCOR) 10 MG tablet Take 1 tablet (10 mg total) by mouth at bedtime. 03/17/14  Yes Olugbemiga Annitta NeedsE Jegede, MD  traMADol (ULTRAM) 50 MG tablet Take 1 tablet (50 mg total) by mouth every 6 (six) hours as needed. Patient not taking: Reported on 02/05/2015 07/27/14   Rodolph BongEvan S Corey, MD   BP 134/73 mmHg  Pulse 76  Resp 16  SpO2 99%  LMP 01/22/2015 (Exact Date) Physical Exam  Constitutional: She is oriented to person, place, and  time. She appears well-developed and well-nourished.  HENT:  Head: Normocephalic.  Neck: Normal range of motion. Neck supple.  Cardiovascular: Normal rate and regular rhythm.   Pulmonary/Chest: Effort normal and breath sounds normal. She has no wheezes. She has no rales. She exhibits tenderness.  Tenderness to left chest.  Abdominal: Soft. Bowel sounds are normal. There is no tenderness. There is no rebound and no guarding.  Musculoskeletal: Normal range of motion. She exhibits no edema.  Neurological: She is alert and oriented to person, place, and time.  Skin: Skin is warm and dry. No rash noted.  Psychiatric: She has a normal mood and affect.    ED Course  Procedures (including critical care time) Labs Review Labs Reviewed - No data to display  Imaging Review No results found. I have personally reviewed and evaluated these images and lab results as part of my medical decision-making.   EKG Interpretation None      MDM   Final diagnoses:  None    1. Chest pain, nonspecific 2. Hyperglycemia in known diabetic.  Hyperglycemia responds well to IV fluids, down to 243 from 390. No acidosis, normal anion gap. She reports missing 2 days of her metformin this week. She is encouraged to take her medications as prescribed.  Chest pain is atypical for cardiogenic pain. She has risk factors including DM, HTN, obesity, but work up today negative. Review of chart show negative evaluation in August 2016 for same symptom set. Doubt ACS. She is evaluated by Dr. Ethelda ChickJacubowitz who is in agreement. She is stable for discharge home.     Elpidio AnisShari Jong Rickman, PA-C 02/06/15 [id number]  Doug SouSam Jacubowitz, MD 02/06/15 1430

## 2015-02-05 NOTE — ED Notes (Signed)
Pt arrived via [facility] from home c/o left sided chest pain and left 3rd and 4th finger tingling/numbness for the last 5 hours.  Hx of anxiety.

## 2015-02-06 LAB — CBG MONITORING, ED: Glucose-Capillary: 243 mg/dL — ABNORMAL HIGH (ref 65–99)

## 2015-02-06 MED ORDER — SODIUM CHLORIDE 0.9 % IV BOLUS (SEPSIS)
2000.0000 mL | Freq: Once | INTRAVENOUS | Status: AC
  Administered 2015-02-06: 2000 mL via INTRAVENOUS

## 2015-02-06 NOTE — ED Provider Notes (Signed)
Complains of left anterior chest pain with tingling in her left arm onset 5 PM today accompanied by shortness of breath pain is pleuritic in quality, mild at present. Other associated symptoms include mild nausea. Pain is nonexertional. No treatment prior to coming here. She feels improved since being here. No other associated symptoms cart risk factors obesity, diabetes hypertension hypercholesterolemia. On exam no distress neck supple. No JVD no bruit lungs clear to auscultation heart regular rate and rhythm no murmurs abdomen obese nontender extremity without edema. All for some reason her neurovascular intact. Heart score equals 2 based on risk factors. Low clinical suspicion for acute coronary syndrome.  Doug SouSam Kristion Holifield, MD 02/06/15 [phone]

## 2015-02-06 NOTE — Discharge Instructions (Signed)
Hyperglycemia °Hyperglycemia occurs when the glucose (sugar) in your blood is too high. Hyperglycemia can happen for many reasons, but it most often happens to people who do not know they have diabetes or are not managing their diabetes properly.  °CAUSES  °Whether you have diabetes or not, there are other causes of hyperglycemia. Hyperglycemia can occur when you have diabetes, but it can also occur in other situations that you might not be as aware of, such as: °Diabetes °· If you have diabetes and are having problems controlling your blood glucose, hyperglycemia could occur because of some of the following reasons: °¨ Not following your meal plan. °¨ Not taking your diabetes medications or not taking it properly. °¨ Exercising less or doing less activity than you normally do. °¨ Being sick. °Pre-diabetes °· This cannot be ignored. Before people develop Type 2 diabetes, they almost always have "pre-diabetes." This is when your blood glucose levels are higher than normal, but not yet high enough to be diagnosed as diabetes. Research has shown that some long-term damage to the body, especially the heart and circulatory system, may already be occurring during pre-diabetes. If you take action to manage your blood glucose when you have pre-diabetes, you may delay or prevent Type 2 diabetes from developing. °Stress °· If you have diabetes, you may be "diet" controlled or on oral medications or insulin to control your diabetes. However, you may find that your blood glucose is higher than usual in the hospital whether you have diabetes or not. This is often referred to as "stress hyperglycemia." Stress can elevate your blood glucose. This happens because of hormones put out by the body during times of stress. If stress has been the cause of your high blood glucose, it can be followed regularly by your caregiver. That way he/she can make sure your hyperglycemia does not continue to get worse or progress to  diabetes. °Steroids °· Steroids are medications that act on the infection fighting system (immune system) to block inflammation or infection. One side effect can be a rise in blood glucose. Most people can produce enough extra insulin to allow for this rise, but for those who cannot, steroids make blood glucose levels go even higher. It is not unusual for steroid treatments to "uncover" diabetes that is developing. It is not always possible to determine if the hyperglycemia will go away after the steroids are stopped. A special blood test called an A1c is sometimes done to determine if your blood glucose was elevated before the steroids were started. °SYMPTOMS °· Thirsty. °· Frequent urination. °· Dry mouth. °· Blurred vision. °· Tired or fatigue. °· Weakness. °· Sleepy. °· Tingling in feet or leg. °DIAGNOSIS  °Diagnosis is made by monitoring blood glucose in one or all of the following ways: °· A1c test. This is a chemical found in your blood. °· Fingerstick blood glucose monitoring. °· Laboratory results. °TREATMENT  °First, knowing the cause of the hyperglycemia is important before the hyperglycemia can be treated. Treatment may include, but is not be limited to: °· Education. °· Change or adjustment in medications. °· Change or adjustment in meal plan. °· Treatment for an illness, infection, etc. °· More frequent blood glucose monitoring. °· Change in exercise plan. °· Decreasing or stopping steroids. °· Lifestyle changes. °HOME CARE INSTRUCTIONS  °· Test your blood glucose as directed. °· Exercise regularly. Your caregiver will give you instructions about exercise. Pre-diabetes or diabetes which comes on with stress is helped by exercising. °· Eat wholesome,   balanced meals. Eat often and at regular, fixed times. Your caregiver or nutritionist will give you a meal plan to guide your sugar intake. °· Being at an ideal weight is important. If needed, losing as little as 10 to 15 pounds may help improve blood  glucose levels. °SEEK MEDICAL CARE IF:  °· You have questions about medicine, activity, or diet. °· You continue to have symptoms (problems such as increased thirst, urination, or weight gain). °SEEK IMMEDIATE MEDICAL CARE IF:  °· You are vomiting or have diarrhea. °· Your breath smells fruity. °· You are breathing faster or slower. °· You are very sleepy or incoherent. °· You have numbness, tingling, or pain in your feet or hands. °· You have chest pain. °· Your symptoms get worse even though you have been following your caregiver's orders. °· If you have any other questions or concerns. °  °This information is not intended to replace advice given to you by your health care provider. Make sure you discuss any questions you have with your health care provider. °  °Document Released: 08/17/2000 Document Revised: 05/16/2011 Document Reviewed: 10/28/2014 °Elsevier Interactive Patient Education ©2016 [employer] ° °Nonspecific Chest Pain  °Chest pain can be caused by many different conditions. There is always a chance that your pain could be related to something serious, such as a heart attack or a blood clot in your lungs. Chest pain can also be caused by conditions that are not life-threatening. If you have chest pain, it is very important to follow up with your health care provider. °CAUSES  °Chest pain can be caused by: °· Heartburn. °· Pneumonia or bronchitis. °· Anxiety or stress. °· Inflammation around your heart (pericarditis) or lung (pleuritis or pleurisy). °· A blood clot in your lung. °· A collapsed lung (pneumothorax). It can develop suddenly on its own (spontaneous pneumothorax) or from trauma to the chest. °· Shingles infection (varicella-zoster virus). °· Heart attack. °· Damage to the bones, muscles, and cartilage that make up your chest wall. This can include: °¨ Bruised bones due to injury. °¨ Strained muscles or cartilage due to frequent or repeated coughing or overwork. °¨ Fracture to one or more  ribs. °¨ Sore cartilage due to inflammation (costochondritis). °RISK FACTORS  °Risk factors for chest pain may include: °· Activities that increase your risk for trauma or injury to your chest. °· Respiratory infections or conditions that cause frequent coughing. °· Medical conditions or overeating that can cause heartburn. °· Heart disease or family history of heart disease. °· Conditions or health behaviors that increase your risk of developing a blood clot. °· Having had chicken pox (varicella zoster). °SIGNS AND SYMPTOMS °Chest pain can feel like: °· Burning or tingling on the surface of your chest or deep in your chest. °· Crushing, pressure, aching, or squeezing pain. °· Dull or sharp pain that is worse when you move, cough, or take a deep breath. °· Pain that is also felt in your back, neck, shoulder, or arm, or pain that spreads to any of these areas. °Your chest pain may come and go, or it may stay constant. °DIAGNOSIS °Lab tests or other studies may be needed to find the cause of your pain. Your health care provider may have you take a test called an ambulatory ECG (electrocardiogram). An ECG records your heartbeat patterns at the time the test is performed. You may also have other tests, such as: °· Transthoracic echocardiogram (TTE). During echocardiography, sound waves are used to create a   picture of all of the heart structures and to look at how blood flows through your heart. °· Transesophageal echocardiogram (TEE). This is a more advanced imaging test that obtains images from inside your body. It allows your health care provider to see your heart in finer detail. °· Cardiac monitoring. This allows your health care provider to monitor your heart rate and rhythm in real time. °· Holter monitor. This is a portable device that records your heartbeat and can help to diagnose abnormal heartbeats. It allows your health care provider to track your heart activity for several days, if needed. °· Stress tests.  These can be done through exercise or by taking medicine that makes your heart beat more quickly. °· Blood tests. °· Imaging tests. °TREATMENT  °Your treatment depends on what is causing your chest pain. Treatment may include: °· Medicines. These may include: °¨ Acid blockers for heartburn. °¨ Anti-inflammatory medicine. °¨ Pain medicine for inflammatory conditions. °¨ Antibiotic medicine, if an infection is present. °¨ Medicines to dissolve blood clots. °¨ Medicines to treat coronary artery disease. °· Supportive care for conditions that do not require medicines. This may include: °¨ Resting. °¨ Applying heat or cold packs to injured areas. °¨ Limiting activities until pain decreases. °HOME CARE INSTRUCTIONS °· If you were prescribed an antibiotic medicine, finish it all even if you start to feel better. °· Avoid any activities that bring on chest pain. °· Do not use any tobacco products, including cigarettes, chewing tobacco, or electronic cigarettes. If you need help quitting, ask your health care provider. °· Do not drink alcohol. °· Take medicines only as directed by your health care provider. °· Keep all follow-up visits as directed by your health care provider. This is important. This includes any further testing if your chest pain does not go away. °· If heartburn is the cause for your chest pain, you may be told to keep your head raised (elevated) while sleeping. This reduces the chance that acid will go from your stomach into your esophagus. °· Make lifestyle changes as directed by your health care provider. These may include: °¨ Getting regular exercise. Ask your health care provider to suggest some activities that are safe for you. °¨ Eating a heart-healthy diet. A registered dietitian can help you to learn healthy eating options. °¨ Maintaining a healthy weight. °¨ Managing diabetes, if necessary. °¨ Reducing stress. °SEEK MEDICAL CARE IF: °· Your chest pain does not go away after treatment. °· You have  a rash with blisters on your chest. °· You have a fever. °SEEK IMMEDIATE MEDICAL CARE IF:  °· Your chest pain is worse. °· You have an increasing cough, or you cough up blood. °· You have severe abdominal pain. °· You have severe weakness. °· You faint. °· You have chills. °· You have sudden, unexplained chest discomfort. °· You have sudden, unexplained discomfort in your arms, back, neck, or jaw. °· You have shortness of breath at any time. °· You suddenly start to sweat, or your skin gets clammy. °· You feel nauseous or you vomit. °· You suddenly feel light-headed or dizzy. °· Your heart begins to beat quickly, or it feels like it is skipping beats. °These symptoms may represent a serious problem that is an emergency. Do not wait to see if the symptoms will go away. Get medical help right away. Call your local emergency services (911 in the [place] Do not drive yourself to the hospital. °  °This information is not intended to replace   advice given to you by your health care provider. Make sure you discuss any questions you have with your health care provider. °  °Document Released: 12/01/2004 Document Revised: 03/14/2014 Document Reviewed: 09/27/2013 °Elsevier Interactive Patient Education ©2016 [employer] ° °

## 2015-02-06 NOTE — ED Notes (Signed)
Pt stable, ambulatory, states understanding of discharge instructions 

## 2015-02-06 NOTE — ED Notes (Signed)
Shari PA notified of pt's glucose level of 390, verbal order given for 2L bolus of NS and then recheck CBG. Pt states that she was out of her metformin for 2 days last week but has been back on it since Monday. Pt takes insulin at home.

## 2015-04-27 ENCOUNTER — Observation Stay (HOSPITAL_COMMUNITY)
Admission: EM | Admit: 2015-04-27 | Discharge: 2015-04-29 | Disposition: A | Discharge status: Home / Self Care | Payer: No Typology Code available for payment source | Attending: Internal Medicine | Admitting: Internal Medicine

## 2015-04-27 DIAGNOSIS — Z7982 Long term (current) use of aspirin: Secondary | ICD-10-CM | POA: Insufficient documentation

## 2015-04-27 DIAGNOSIS — I10 Essential (primary) hypertension: Secondary | ICD-10-CM | POA: Insufficient documentation

## 2015-04-27 DIAGNOSIS — G43909 Migraine, unspecified, not intractable, without status migrainosus: Secondary | ICD-10-CM | POA: Insufficient documentation

## 2015-04-27 DIAGNOSIS — Z23 Encounter for immunization: Secondary | ICD-10-CM | POA: Insufficient documentation

## 2015-04-27 DIAGNOSIS — Z794 Long term (current) use of insulin: Secondary | ICD-10-CM | POA: Insufficient documentation

## 2015-04-27 DIAGNOSIS — R079 Chest pain, unspecified: Principal | ICD-10-CM | POA: Insufficient documentation

## 2015-04-27 DIAGNOSIS — E1165 Type 2 diabetes mellitus with hyperglycemia: Secondary | ICD-10-CM

## 2015-04-27 DIAGNOSIS — R55 Syncope and collapse: Secondary | ICD-10-CM | POA: Insufficient documentation

## 2015-04-27 DIAGNOSIS — Z7984 Long term (current) use of oral hypoglycemic drugs: Secondary | ICD-10-CM | POA: Insufficient documentation

## 2015-04-27 DIAGNOSIS — Z87891 Personal history of nicotine dependence: Secondary | ICD-10-CM | POA: Insufficient documentation

## 2015-04-27 DIAGNOSIS — E119 Type 2 diabetes mellitus without complications: Secondary | ICD-10-CM

## 2015-04-27 DIAGNOSIS — Z79899 Other long term (current) drug therapy: Secondary | ICD-10-CM | POA: Insufficient documentation

## 2015-04-28 ENCOUNTER — Emergency Department (HOSPITAL_COMMUNITY): Payer: Self-pay

## 2015-04-28 ENCOUNTER — Encounter (HOSPITAL_COMMUNITY): Payer: Self-pay | Admitting: Emergency Medicine

## 2015-04-28 DIAGNOSIS — Z794 Long term (current) use of insulin: Secondary | ICD-10-CM | POA: Diagnosis not present

## 2015-04-28 DIAGNOSIS — R0600 Dyspnea, unspecified: Secondary | ICD-10-CM | POA: Diagnosis not present

## 2015-04-28 DIAGNOSIS — R55 Syncope and collapse: Secondary | ICD-10-CM | POA: Diagnosis not present

## 2015-04-28 DIAGNOSIS — E1142 Type 2 diabetes mellitus with diabetic polyneuropathy: Secondary | ICD-10-CM | POA: Diagnosis not present

## 2015-04-28 DIAGNOSIS — E1165 Type 2 diabetes mellitus with hyperglycemia: Secondary | ICD-10-CM

## 2015-04-28 DIAGNOSIS — R072 Precordial pain: Secondary | ICD-10-CM

## 2015-04-28 DIAGNOSIS — I10 Essential (primary) hypertension: Secondary | ICD-10-CM | POA: Diagnosis not present

## 2015-04-28 DIAGNOSIS — R079 Chest pain, unspecified: Secondary | ICD-10-CM | POA: Diagnosis present

## 2015-04-28 DIAGNOSIS — E119 Type 2 diabetes mellitus without complications: Secondary | ICD-10-CM

## 2015-04-28 LAB — GLUCOSE, CAPILLARY
GLUCOSE-CAPILLARY: 294 mg/dL — AB (ref 65–99)
GLUCOSE-CAPILLARY: 358 mg/dL — AB (ref 65–99)
Glucose-Capillary: 348 mg/dL — ABNORMAL HIGH (ref 65–99)

## 2015-04-28 LAB — INFLUENZA PANEL BY PCR (TYPE A & B)
H1N1 flu by pcr: NOT DETECTED
Influenza A By PCR: NEGATIVE
Influenza B By PCR: NEGATIVE

## 2015-04-28 LAB — BASIC METABOLIC PANEL
ANION GAP: 9 (ref 5–15)
BUN: 9 mg/dL (ref 6–20)
CALCIUM: 9.2 mg/dL (ref 8.9–10.3)
CO2: 21 mmol/L — ABNORMAL LOW (ref 22–32)
CREATININE: 0.93 mg/dL (ref 0.44–1.00)
Chloride: 105 mmol/L (ref 101–111)
Glucose, Bld: 256 mg/dL — ABNORMAL HIGH (ref 65–99)
Potassium: 4.1 mmol/L (ref 3.5–5.1)
Sodium: 135 mmol/L (ref 135–145)

## 2015-04-28 LAB — CBG MONITORING, ED
GLUCOSE-CAPILLARY: 120 mg/dL — AB (ref 65–99)
GLUCOSE-CAPILLARY: 103 mg/dL — AB (ref 65–99)
GLUCOSE-CAPILLARY: 133 mg/dL — AB (ref 65–99)
Glucose-Capillary: 161 mg/dL — ABNORMAL HIGH (ref 65–99)

## 2015-04-28 LAB — CBC
HCT: 33.7 % — ABNORMAL LOW (ref 36.0–46.0)
HEMOGLOBIN: 11.1 g/dL — AB (ref 12.0–15.0)
MCH: 29.2 pg (ref 26.0–34.0)
MCHC: 32.9 g/dL (ref 30.0–36.0)
MCV: 88.7 fL (ref 78.0–100.0)
PLATELETS: 303 10*3/uL (ref 150–400)
RBC: 3.8 MIL/uL — AB (ref 3.87–5.11)
RDW: 14.3 % (ref 11.5–15.5)
WBC: 6.2 10*3/uL (ref 4.0–10.5)

## 2015-04-28 LAB — TROPONIN I: Troponin I: <0.03 ng/mL (ref <0.031)

## 2015-04-28 LAB — MAGNESIUM: Magnesium: 1.6 mg/dL — ABNORMAL LOW (ref 1.7–2.4)

## 2015-04-28 LAB — I-STAT TROPONIN, ED: TROPONIN I, POC: 0 ng/mL (ref 0.00–0.08)

## 2015-04-28 LAB — D-DIMER, QUANTITATIVE (NOT AT ARMC)

## 2015-04-28 MED ORDER — ENOXAPARIN SODIUM 40 MG/0.4ML ~~LOC~~ SOLN
40.0000 mg | SUBCUTANEOUS | Status: DC
Start: 2015-04-28 — End: 2015-04-29
  Administered 2015-04-28 – 2015-04-29 (×2): 40 mg via SUBCUTANEOUS
  Filled 2015-04-28 (×3): qty 0.4

## 2015-04-28 MED ORDER — OSELTAMIVIR PHOSPHATE 75 MG PO CAPS
75.0000 mg | ORAL_CAPSULE | Freq: Two times a day (BID) | ORAL | Status: DC
  Administered 2015-04-28: 75 mg via ORAL
  Filled 2015-04-28 (×2): qty 1

## 2015-04-28 MED ORDER — INSULIN NPH (HUMAN) (ISOPHANE) 100 UNIT/ML ~~LOC~~ SUSP
10.0000 [IU] | Freq: Two times a day (BID) | SUBCUTANEOUS | Status: DC
  Administered 2015-04-28 – 2015-04-29 (×2): 10 [IU] via SUBCUTANEOUS
  Filled 2015-04-28 (×2): qty 10

## 2015-04-28 MED ORDER — ASPIRIN EC 81 MG PO TBEC
81.0000 mg | DELAYED_RELEASE_TABLET | Freq: Every day | ORAL | Status: DC
  Administered 2015-04-28 – 2015-04-29 (×2): 81 mg via ORAL
  Filled 2015-04-28 (×2): qty 1

## 2015-04-28 MED ORDER — ASPIRIN 81 MG PO CHEW
324.0000 mg | CHEWABLE_TABLET | Freq: Once | ORAL | Status: DC
  Filled 2015-04-28: qty 4

## 2015-04-28 MED ORDER — MAGNESIUM SULFATE 2 GM/50ML IV SOLN
2.0000 g | Freq: Once | INTRAVENOUS | Status: AC
  Administered 2015-04-28: 2 g via INTRAVENOUS
  Filled 2015-04-28: qty 50

## 2015-04-28 MED ORDER — FERROUS SULFATE 325 (65 FE) MG PO TABS
325.0000 mg | ORAL_TABLET | Freq: Two times a day (BID) | ORAL | Status: DC
  Administered 2015-04-28 – 2015-04-29 (×2): 325 mg via ORAL
  Filled 2015-04-28 (×2): qty 1

## 2015-04-28 MED ORDER — INSULIN ASPART PROT & ASPART (70-30 MIX) 100 UNIT/ML ~~LOC~~ SUSP: 20.0000 [IU] | Freq: Two times a day (BID) | SUBCUTANEOUS | Status: DC

## 2015-04-28 MED ORDER — ONDANSETRON HCL 4 MG/2ML IJ SOLN: 4.0000 mg | Freq: Four times a day (QID) | INTRAMUSCULAR | Status: DC | PRN

## 2015-04-28 MED ORDER — ACETAMINOPHEN 325 MG PO TABS: 650.0000 mg | ORAL_TABLET | ORAL | Status: DC | PRN

## 2015-04-28 MED ORDER — NITROGLYCERIN 0.4 MG SL SUBL
0.4000 mg | SUBLINGUAL_TABLET | SUBLINGUAL | Status: DC | PRN
  Administered 2015-04-28 (×2): 0.4 mg via SUBLINGUAL
  Filled 2015-04-28: qty 1

## 2015-04-28 MED ORDER — INFLUENZA VAC SPLIT QUAD 0.5 ML IM SUSY
0.5000 mL | PREFILLED_SYRINGE | INTRAMUSCULAR | Status: AC
  Administered 2015-04-29: 0.5 mL via INTRAMUSCULAR

## 2015-04-28 MED ORDER — LISINOPRIL-HYDROCHLOROTHIAZIDE 20-12.5 MG PO TABS: 1.0000 | ORAL_TABLET | Freq: Every day | ORAL | Status: DC

## 2015-04-28 MED ORDER — HYDROCHLOROTHIAZIDE 12.5 MG PO CAPS
12.5000 mg | ORAL_CAPSULE | Freq: Every day | ORAL | Status: DC
  Administered 2015-04-28 – 2015-04-29 (×2): 12.5 mg via ORAL
  Filled 2015-04-28 (×2): qty 1

## 2015-04-28 MED ORDER — MORPHINE SULFATE (PF) 4 MG/ML IV SOLN
4.0000 mg | Freq: Once | INTRAVENOUS | Status: AC
  Administered 2015-04-28: 4 mg via INTRAVENOUS
  Filled 2015-04-28: qty 1

## 2015-04-28 MED ORDER — INSULIN ASPART 100 UNIT/ML ~~LOC~~ SOLN
0.0000 [IU] | SUBCUTANEOUS | Status: DC
  Administered 2015-04-28: 7 [IU] via SUBCUTANEOUS
  Administered 2015-04-28: 5 [IU] via SUBCUTANEOUS
  Administered 2015-04-28: 9 [IU] via SUBCUTANEOUS
  Administered 2015-04-28: 2 [IU] via SUBCUTANEOUS
  Administered 2015-04-29: 5 [IU] via SUBCUTANEOUS
  Administered 2015-04-29 (×2): 3 [IU] via SUBCUTANEOUS
  Filled 2015-04-28: qty 1

## 2015-04-28 MED ORDER — ONDANSETRON HCL 4 MG/2ML IJ SOLN
4.0000 mg | Freq: Once | INTRAMUSCULAR | Status: AC
  Administered 2015-04-28: 4 mg via INTRAVENOUS
  Filled 2015-04-28: qty 2

## 2015-04-28 MED ORDER — LISINOPRIL 20 MG PO TABS
20.0000 mg | ORAL_TABLET | Freq: Every day | ORAL | Status: DC
  Administered 2015-04-28 – 2015-04-29 (×2): 20 mg via ORAL
  Filled 2015-04-28 (×2): qty 1

## 2015-04-28 MED ORDER — SIMVASTATIN 10 MG PO TABS
10.0000 mg | ORAL_TABLET | Freq: Every day | ORAL | Status: DC
  Administered 2015-04-28: 10 mg via ORAL
  Filled 2015-04-28: qty 1

## 2015-04-28 MED ORDER — GABAPENTIN 300 MG PO CAPS
300.0000 mg | ORAL_CAPSULE | Freq: Three times a day (TID) | ORAL | Status: DC
  Administered 2015-04-28 – 2015-04-29 (×4): 300 mg via ORAL
  Filled 2015-04-28 (×4): qty 1

## 2015-04-28 NOTE — ED Notes (Signed)
Pt arrives via EMS from work with c/o substernal CP ongoing over the last several days, worse today. Substernal. Non radiating, described as a pressure. Reports nausea and vomiting, one episode of SHOB that has since resolved. Received 1 SL nitro from EMS, 324 MG aspirin prior to their arrival on scene. IV in place. Hx of similar episode attributed to stress in December. States it feels the same.

## 2015-04-28 NOTE — ED Notes (Signed)
Dr. Julian Reil paged for change in bed. Pt still having cp 2/10.

## 2015-04-28 NOTE — Progress Notes (Signed)
Patient seen and examined. HPI reviewed.  Patient presents with chest pain, constants, worse with deep breath. Develops cough overnight. Some runny nose, sore throat.  Sick contact at work.  Mon at bedside relates patient has had this chest pain in the past, there is family history of heart diseases.  Per Admitted Note and ED physician note  patient admitted overnight for stress test.   1-Chest pain: Dyspnea; atypical. She does has risk factors, DM, HTN, Obesity. Cardiology consulted.  -Suspect related to vital PNA. Will check for influenza.  Troponin times 2 negative.  ECHO ordered.  EKG with r'sr V 1 V 2.   2-Viral illness; report cough, runny nose, sick contact. Check for influenza. Start tamiflu. Chest x ray with minimal left mid lung opacity . Hold on start antibiotics for PNA. WBC normal.   3-DM; on NPH BID. Check CBG  4-HNT; lisinopril, HCTZ  Hartley Barefoot, MD.

## 2015-04-28 NOTE — H&P (Signed)
[facility] History and Physical  Mariah Miller ZOX:[id number] DOB: Apr 12, 1975 DOA: 04/27/2015  Referring physician: EDP PCP: Jeanann Lewandowsky, MD   Chief Complaint: Chest pain   HPI: Mariah Miller is a 40 y.o. female with h/o HTN, DM.  Patient presents to the ED with c/o constant, ongoing, substernal CP.  Symptoms onset 7:30 AM yesterday while working.  Nothing makes symptoms better or worse, not clear if they are exertional.  There is associated SOB, nausea and vomiting.  Former smoker as well.  Has "heart issues" in family but unclear if these are related to CAD.  No h/o blood clots, no exogenous estrogen.  Questionable episode of syncope.  No fevers, no cough.  Review of Systems: Systems reviewed.  As above, otherwise negative  Past Medical History  Diagnosis Date  . Hypertension   . Diabetes mellitus without complication (HCC)   . Migraine   . Seizures (HCC)     self reported   Past Surgical History  Procedure Laterality Date  . Cholecystectomy    . Tonsillectomy    . Tubal ligation    . Cyst excision    . Shoulder open rotator cuff repair Left    Social History:  reports that she has never smoked. She does not have any smokeless tobacco history on file. She reports that she does not drink alcohol or use illicit drugs.  No Known Allergies  Family History  Problem Relation Age of Onset  . Hypertension Mother   . Thyroid disease Mother   . Bipolar disorder Sister      Prior to Admission medications   Medication Sig Start Date End Date Taking? Authorizing Provider  aspirin EC 81 MG tablet Take 1 tablet (81 mg total) by mouth daily. 01/22/13  Yes Nishant Dhungel, MD  ferrous sulfate 325 (65 FE) MG tablet Take 1 tablet (325 mg total) by mouth 2 (two) times daily with a meal. 03/27/14  Yes Olugbemiga E Hyman Hopes, MD  gabapentin (NEURONTIN) 300 MG capsule Take 1 capsule (300 mg total) by mouth 3 (three) times daily. 03/17/14  Yes Olugbemiga Annitta Needs, MD   insulin aspart (NOVOLOG) 100 UNIT/ML injection Inject 3-10 Units into the skin 3 (three) times daily as needed for high blood sugar. 150 - 200 = 3 units, 201 - 250 = 5 units, 251 - 300 = 7 units 301 - 350 = 10 units, > 350, call or come to clinic 06/20/13  Yes Olugbemiga E Hyman Hopes, MD  insulin NPH-regular Human (NOVOLIN 70/30) (70-30) 100 UNIT/ML injection Inject 20 Units into the skin 2 (two) times daily. 03/27/14  Yes Quentin Angst, MD  lisinopril-hydrochlorothiazide (PRINZIDE,ZESTORETIC) 20-12.5 MG per tablet Take 1 tablet by mouth daily. 03/17/14  Yes Quentin Angst, MD  metFORMIN (GLUCOPHAGE) 1000 MG tablet Take 1 tablet (1,000 mg total) by mouth 2 (two) times daily with a meal. 03/17/14  Yes Quentin Angst, MD  simvastatin (ZOCOR) 10 MG tablet Take 1 tablet (10 mg total) by mouth at bedtime. 03/17/14  Yes Quentin Angst, MD   Physical Exam: Filed Vitals:   04/28/15 0315 04/28/15 0331  BP: 145/88 153/83  Pulse: 85 80  Temp:    Resp: 17 15    BP 153/83 mmHg  Pulse 80  Temp(Src) 99.2 F (37.3 C) (Oral)  Resp 15  SpO2 100%  LMP 04/06/2015 (Approximate)  General Appearance:    Alert, oriented, no distress, appears stated age  Head:    Normocephalic, atraumatic  Eyes:  PERRL, EOMI, sclera non-icteric        Nose:   Nares without drainage or epistaxis. Mucosa, turbinates normal  Throat:   Moist mucous membranes. Oropharynx without erythema or exudate.  Neck:   Supple. No carotid bruits.  No thyromegaly.  No lymphadenopathy.   Back:     No CVA tenderness, no spinal tenderness  Lungs:     Clear to auscultation bilaterally, without wheezes, rhonchi or rales  Chest wall:    No tenderness to palpitation  Heart:    Regular rate and rhythm without murmurs, gallops, rubs  Abdomen:     Soft, non-tender, nondistended, normal bowel sounds, no organomegaly  Genitalia:    deferred  Rectal:    deferred  Extremities:   No clubbing, cyanosis or edema.  Pulses:   2+ and  symmetric all extremities  Skin:   Skin color, texture, turgor normal, no rashes or lesions  Lymph nodes:   Cervical, supraclavicular, and axillary nodes normal  Neurologic:   CNII-XII intact. Normal strength, sensation and reflexes      throughout    Labs on Admission:  Basic Metabolic Panel:  Recent Labs Lab 04/28/15 0038  NA 135  K 4.1  CL 105  CO2 21*  GLUCOSE 256*  BUN 9  CREATININE 0.93  CALCIUM 9.2   Liver Function Tests: No results for input(s): AST, ALT, ALKPHOS, BILITOT, PROT, ALBUMIN in the last 168 hours. No results for input(s): LIPASE, AMYLASE in the last 168 hours. No results for input(s): AMMONIA in the last 168 hours. CBC:  Recent Labs Lab 04/28/15 0038  WBC 6.2  HGB 11.1*  HCT 33.7*  MCV 88.7  PLT 303   Cardiac Enzymes: No results for input(s): CKTOTAL, CKMB, CKMBINDEX, TROPONINI in the last 168 hours.  BNP (last 3 results) No results for input(s): PROBNP in the last 8760 hours. CBG: No results for input(s): GLUCAP in the last 168 hours.  Radiological Exams on Admission: Dg Chest 2 View  04/28/2015  CLINICAL DATA:  Acute onset of left-sided chest pain and shortness of breath. Initial encounter. EXAM: CHEST  2 VIEW COMPARISON:  Chest radiograph from 12/23/2014 FINDINGS: The lungs are well-aerated. Minimal left midlung opacity raises concern for pneumonia. There appears to be an overlying hair scrunchy, limiting evaluation of the left midlung. There is no evidence of pleural effusion or pneumothorax. The heart is normal in size; the mediastinal contour is within normal limits. No acute osseous abnormalities are seen. IMPRESSION: Minimal left midlung opacity raises concern for pneumonia. There appears to be an overlying hair scrunchy, limiting evaluation of the left midlung. Electronically Signed   By: Roanna Raider M.D.   On: 04/28/2015 01:09    EKG: Independently reviewed.  Assessment/Plan Principal Problem:   Chest pain Active Problems:    Essential hypertension, benign   DM2 (diabetes mellitus, type 2) (HCC)   1. CP - 1. Chest pain obs pathway 2. HEART score is 3-4 depending on subjective 3. Serial trops 4. NPO after midnight for possible stress test in AM 2. DM2 - 1. Holding home meds including insulin 2. Putting patient on NPH 10 units BID (instead of 70/30 20 units BID) 3. Sensitive scale SSI 3. HTN - continue home meds    Code Status: Full  Family Communication: Family at bedside Disposition Plan: Admit to obs   Time spent: 50 min  GARDNER, JARED M. [facility] Pager [phone]  If 7AM-7PM, please contact the day team taking care of the patient [email or web address]  Password [email or web address] 04/28/2015, 5:12 AM

## 2015-04-28 NOTE — ED Notes (Signed)
Dr Ward back at the bedside.

## 2015-04-28 NOTE — Consult Note (Signed)
Cardiology Consultation Note    Patient ID: Mariah Miller, MRN: [id number], DOB/AGE: 13-May-1975 40 y.o. Admit date: 04/27/2015   Date of Consult: 04/28/2015 Primary Physician: Jeanann Lewandowsky, MD Primary Cardiologist: New  Chief Complaint: chest pain Reason for Consultation: chest pain Requesting MD: Dr. Sunnie Nielsen  HPI: Ms. Mastandrea is a 40 y/o F with history of HTN, DM, migraines, 4-year-prior-tobacco use, self-reported h/o seizures who presented to [facility] with complaints of chest pain. Yesterday AM she began to notice that she felt chest pain that eased off while she layed on her left side. She was able to fall asleep but the discomfort then began waking her up within an hour and a half. Around 9 she sat straight up in bed and suddenly the pain became acutely worse. She got dressed and decided to go onto work. However, her pain continued in a constant fashion and continued to get worse throughout her shift. 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 She is unsure how long she was out for. When asked about possible seizure activity, she states her coworkers wouldn't have known what to look for. She states they didn't know how how to use the glucometer at work. They did not tell her how long she was out or what measures they took to awaken her. She denies any b/b incontinence, post-ictal state or antecedent palpitations. Her left arm felt numb afterwards. Per notes she had a similar episode in December attributed to stress. She received 1 SL NTG and 324mg  ASA by EMS without significant relief. She received 2 additional SL NTG here in the ER with minimal relief. She finally felt relief with morphine. She denies any diaphoresis or radiation of symptoms. Pain was not worse with inspiration or palpation. She has not had any LEE, orthopnea or fever. She has felt  intermittently SOB the last 2 days both at rest and with movement but not always consistent pattern. She does report nasal congestion, cough, and chills. Mother reports a self-history of murmur as well as a family member with a hole in their heart. There are distant relatives with prior heart attacks but no immediate family with CAD. She presently appears comfortable, laughing and joking with family.  Workup thus far notable for troponins neg x 3, d-dimer negative, Hgb 11.1, glucose 256, minimal left mid-lung opacity raising concern for PNA. She has been admitted for chest pain rule out and treatment of viral pneumonia/possible influenza. Temp 99.2 on arrival, not hypoxic, tachycardic, or tachypneic. Telemetry unremarkable.   Past Medical History  Diagnosis Date  . Hypertension   . Diabetes mellitus without complication (HCC)   . Migraine   . Seizures (HCC)     self reported      Surgical History:  Past Surgical History  Procedure Laterality Date  . Cholecystectomy    . Tonsillectomy    . Tubal ligation    . Cyst excision    . Shoulder open rotator cuff repair Left      Home Meds: Prior to Admission medications   Medication Sig Start Date End Date Taking? Authorizing Provider  aspirin EC 81 MG tablet Take 1 tablet (81 mg total) by mouth daily. 01/22/13  Yes Nishant Dhungel, MD  ferrous sulfate 325 (65 FE) MG tablet Take 1 tablet (325 mg total) by mouth 2 (two) times daily with a meal.  03/27/14  Yes Quentin Angst, MD  gabapentin (NEURONTIN) 300 MG capsule Take 1 capsule (300 mg total) by mouth 3 (three) times daily. 03/17/14  Yes Olugbemiga Annitta Needs, MD  insulin aspart (NOVOLOG) 100 UNIT/ML injection Inject 3-10 Units into the skin 3 (three) times daily as needed for high blood sugar. 150 - 200 = 3 units, 201 - 250 = 5 units, 251 - 300 = 7 units 301 - 350 = 10 units, > 350, call or come to clinic 06/20/13  Yes Olugbemiga E Hyman Hopes, MD  insulin NPH-regular Human (NOVOLIN 70/30) (70-30)  100 UNIT/ML injection Inject 20 Units into the skin 2 (two) times daily. 03/27/14  Yes Quentin Angst, MD  lisinopril-hydrochlorothiazide (PRINZIDE,ZESTORETIC) 20-12.5 MG per tablet Take 1 tablet by mouth daily. 03/17/14  Yes Quentin Angst, MD  metFORMIN (GLUCOPHAGE) 1000 MG tablet Take 1 tablet (1,000 mg total) by mouth 2 (two) times daily with a meal. 03/17/14  Yes Quentin Angst, MD  simvastatin (ZOCOR) 10 MG tablet Take 1 tablet (10 mg total) by mouth at bedtime. 03/17/14  Yes Quentin Angst, MD    Inpatient Medications:  . aspirin  324 mg Oral Once  . aspirin EC  81 mg Oral Daily  . enoxaparin (LOVENOX) injection  40 mg Subcutaneous Q24H  . ferrous sulfate  325 mg Oral BID WC  . gabapentin  300 mg Oral TID  . lisinopril  20 mg Oral Daily   And  . hydrochlorothiazide  12.5 mg Oral Daily  . insulin aspart  0-9 Units Subcutaneous 6 times per day  . insulin NPH Human  10 Units Subcutaneous BID AC & HS  . oseltamivir  75 mg Oral BID  . simvastatin  10 mg Oral QHS      Allergies: No Known Allergies  Social History   Social History  . Marital Status: Divorced    Spouse Name: N/A  . Number of Children: N/A  . Years of Education: N/A   Occupational History  .      Assembler    Social History Main Topics  . Smoking status: Former Games developer  . Smokeless tobacco: Not on file     Comment: Smoked for 4 years, quit age 12  . Alcohol Use: 0.0 oz/week    0 Standard drinks or equivalent per week     Comment: Holidays only  . Drug Use: No  . Sexual Activity: Yes    Birth Control/ Protection: None   Other Topics Concern  . Not on file   Social History Narrative     Family History  Problem Relation Age of Onset  . Hypertension Mother   . Thyroid disease Mother   . Bipolar disorder Sister   . Irregular heart beat Mother      Review of Systems: All other systems reviewed and are otherwise negative except as noted above.  Labs:  Recent Labs   04/28/15 0512 04/28/15 0754  TROPONINI <0.03 <0.03   Lab Results  Component Value Date   WBC 6.2 04/28/2015   HGB 11.1* 04/28/2015   HCT 33.7* 04/28/2015   MCV 88.7 04/28/2015   PLT 303 04/28/2015    Recent Labs Lab 04/28/15 0038  NA 135  K 4.1  CL 105  CO2 21*  BUN 9  CREATININE 0.93  CALCIUM 9.2  GLUCOSE 256*   Lab Results  Component Value Date   CHOL 169 03/17/2014   HDL 44 03/17/2014   LDLCALC 105* 03/17/2014  TRIG 98 03/17/2014   Lab Results  Component Value Date   DDIMER <0.27 04/28/2015    Radiology/Studies:  Dg Chest 2 View  04/28/2015  CLINICAL DATA:  Acute onset of left-sided chest pain and shortness of breath. Initial encounter. EXAM: CHEST  2 VIEW COMPARISON:  Chest radiograph from 12/23/2014 FINDINGS: The lungs are well-aerated. Minimal left midlung opacity raises concern for pneumonia. There appears to be an overlying hair scrunchy, limiting evaluation of the left midlung. There is no evidence of pleural effusion or pneumothorax. The heart is normal in size; the mediastinal contour is within normal limits. No acute osseous abnormalities are seen. IMPRESSION: Minimal left midlung opacity raises concern for pneumonia. There appears to be an overlying hair scrunchy, limiting evaluation of the left midlung. Electronically Signed   By: Roanna Raider M.D.   On: 04/28/2015 01:09    Wt Readings from Last 3 Encounters:  08/05/14 208 lb (94.348 kg)  06/06/14 208 lb 6.4 oz (94.53 kg)  05/15/14 215 lb (97.523 kg)   EKG: NSR 79bpm, RSR V1 known from prior, no significant change from prior. QQTc .  Physical Exam: Blood pressure 125/59, pulse 70, temperature 99.2 F (37.3 C), temperature source Oral, resp. rate 15, last menstrual period 04/06/2015, SpO2 99 %. There is no weight on file to calculate BMI. General: Well developed, well nourished AAF, in no acute distress. Head: Normocephalic, atraumatic, sclera non-icteric, no xanthomas, nares are without  discharge.  Neck: Negative for carotid bruits. JVD not elevated. Lungs: Clear bilaterally to auscultation without wheezes, rales, or rhonchi. Breathing is unlabored. Heart: RRR with S1 S2. No murmurs, rubs, or gallops appreciated. Abdomen: Soft, non-tender, non-distended with normoactive bowel sounds. No hepatomegaly. No rebound/guarding. No obvious abdominal masses. Msk:  Strength and tone appear normal for age. Extremities: No clubbing or cyanosis. No edema.  Distal pedal pulses are 2+ and equal bilaterally. Neuro: Alert and oriented X 3. No facial asymmetry. No focal deficit. Moves all extremities spontaneously. Psych:  Responds to questions appropriately with a normal affect.    Assessment and Plan  9F with HTN, DM, 4 years of former tobacco use, migraines, prior seizures was admitted with persistent chest pain/dyspnea and syncope, felt to have viral PNA.  1. Chest pain/dyspnea - mixed typical/atypical features. Chest pain persisted >17 hours yesterday without evidence of ischemia by enzymes or EKG changes. Does not really sound like pericarditis either. Suspect possibly due to #3 versus musculoskeletal given positional nature of discomfort. F/u subsequent troponin level. Echo pending. Will discuss further workup with MD.  2. Syncope - does not sound orthostatic in nature, question vagal response to worsening pain. Check echo. Monitor on telemetry. Check orthostatics. Check Mg level. Can determine at discharge if event monitor is necessary.  3. Question of viral PNA - per IM.  4. Essential HTN - follow.  Signed, Laurann Montana PA-C 04/28/2015, 10:54 AM Pager: [phone]  I have personally seen and examined this patient with Ronie Spies, PA-C. I agree with the assessment and plan as outlined above. She his presenting with cough, fever, chest pain. Chest x-ray worrisome for pneumonia. I have personally examined her and agree with the assessment above. I have reviewed her EKG which does not  show ischemic changes. Troponin negative x3 despite pain for 24 hours. I do not think her pain is cardiac. Agree with echo to assess LVEF and exclude structural heart disease. We will follow up in the am. No recommendation for ischemic workup at this time. We will  see her again in the am.   MCALHANY,CHRISTOPHER 04/28/2015 11:56 AM

## 2015-04-28 NOTE — ED Provider Notes (Signed)
By signing my name below, I, Arlan Organ, attest that this documentation has been prepared under the direction and in the presence of Kristen N Ward, DO.  Electronically Signed: Arlan Organ, ED Scribe. 04/28/2015. 12:36 AM.   TIME SEEN: 12:32 AM   CHIEF COMPLAINT:  Chief Complaint  Patient presents with  . Chest Pain     HPI:  HPI Comments: Mariah Miller is a 40 y.o. female with a PMHx of HN, DM, hyperlipidemia and seizures who presents to the Emergency Department complaining of constant, ongoing substernal chest pain onset 7:30 AM yesterday while at work/start of shift. Pain is described as "someone beating on my chest". No aggravating or alleviating factors at this time. She is not sure if it is exertional.  Pt also reports associated shortness of breath, nausea, and vomiting. Pt also reports an episode of numbness to her L arm. No OTC medications or home remedies attempted prior to arrival. She is not a current smoker but admits she was a smoker several years ago. Pt is unaware of details about family history but states "heart issues run in our family". No recent surgeries or broken bones. No recent long distance travel. No prior history of blood clots. Not on exogenous estrogen. No history of PE or DVT. No prior stress test or cardiac catheterization. States tonight she thinks she passed out as well. She denies any fevers or cough.  PCP: Jeanann Lewandowsky, MD    ROS: See HPI Constitutional: no fever  Eyes: no drainage  ENT: no runny nose   Cardiovascular:  Positive chest pain  Resp: Positive SOB  GI: Positive nausea and vomiting GU: no dysuria Integumentary: no rash  Allergy: no hives  Musculoskeletal: no leg swelling  Neurological: no slurred speech. Positive numbness ROS otherwise negative  PAST MEDICAL HISTORY/PAST SURGICAL HISTORY:  Past Medical History  Diagnosis Date  . Hypertension   . Diabetes mellitus without complication (HCC)   . Migraine   . Seizures  (HCC)     self reported    MEDICATIONS:  Prior to Admission medications   Medication Sig Start Date End Date Taking? Authorizing Provider  aspirin EC 81 MG tablet Take 1 tablet (81 mg total) by mouth daily. 01/22/13   Nishant Dhungel, MD  ferrous sulfate 325 (65 FE) MG tablet Take 1 tablet (325 mg total) by mouth 2 (two) times daily with a meal. 03/27/14   Olugbemiga E Hyman Hopes, MD  gabapentin (NEURONTIN) 300 MG capsule Take 1 capsule (300 mg total) by mouth 3 (three) times daily. 03/17/14   Quentin Angst, MD  insulin aspart (NOVOLOG) 100 UNIT/ML injection Inject 3-10 Units into the skin 3 (three) times daily as needed for high blood sugar. 150 - 200 = 3 units, 201 - 250 = 5 units, 251 - 300 = 7 units 301 - 350 = 10 units, > 350, call or come to clinic 06/20/13   Quentin Angst, MD  insulin NPH-regular Human (NOVOLIN 70/30) (70-30) 100 UNIT/ML injection Inject 20 Units into the skin 2 (two) times daily. 03/27/14   Quentin Angst, MD  lisinopril-hydrochlorothiazide (PRINZIDE,ZESTORETIC) 20-12.5 MG per tablet Take 1 tablet by mouth daily. 03/17/14   Quentin Angst, MD  metFORMIN (GLUCOPHAGE) 1000 MG tablet Take 1 tablet (1,000 mg total) by mouth 2 (two) times daily with a meal. 03/17/14   Quentin Angst, MD  simvastatin (ZOCOR) 10 MG tablet Take 1 tablet (10 mg total) by mouth at bedtime. 03/17/14   Olugbemiga E  Hyman Hopes, MD  traMADol (ULTRAM) 50 MG tablet Take 1 tablet (50 mg total) by mouth every 6 (six) hours as needed. Patient not taking: Reported on 02/05/2015 07/27/14   Rodolph Bong, MD    ALLERGIES:  No Known Allergies  SOCIAL HISTORY:  Social History  Substance Use Topics  . Smoking status: Never Smoker   . Smokeless tobacco: Not on file  . Alcohol Use: No    FAMILY HISTORY: Family History  Problem Relation Age of Onset  . Hypertension Mother   . Thyroid disease Mother   . Bipolar disorder Sister     EXAM: BP 136/57 mmHg  Pulse 80  Temp(Src) 99.2 F (37.3  C) (Oral)  Resp 13  SpO2 100%  LMP 04/06/2015 (Approximate) CONSTITUTIONAL: Alert and oriented and responds appropriately to questions. Well-appearing; well-nourished HEAD: Normocephalic EYES: Conjunctivae clear, PERRL ENT: normal nose; no rhinorrhea; moist mucous membranes; pharynx without lesions noted NECK: Supple, no meningismus, no LAD  CARD: RRR; S1 and S2 appreciated; no murmurs, no clicks, no rubs, no gallops CHEST: Chest is tenderness to palpation on L side without ecchymosis, crepitus, and deformity. Palpation does not reproduce chest pain RESP: Normal chest excursion without splinting or tachypnea; breath sounds clear and equal bilaterally; no wheezes, no rhonchi, no rales, no hypoxia or respiratory distress, speaking full sentences ABD/GI: Normal bowel sounds; non-distended; soft, non-tender, no rebound, no guarding, no peritoneal signs BACK:  The back appears normal and is non-tender to palpation, there is no CVA tenderness EXT: Normal ROM in all joints; non-tender to palpation; no edema; normal capillary refill; no cyanosis, no calf tenderness or swelling    SKIN: Normal color for age and race; warm NEURO: Moves all extremities equally, sensation to light touch intact diffusely, cranial nerves II through XII intact PSYCH: The patient's mood and manner are appropriate. Grooming and personal hygiene are appropriate.  MEDICAL DECISION MAKING: Patient here with concerning story for chest pain and syncope. Has multiple risk factors for ACS including hypertension, diabetes, hyperlipidemia, tobacco use and family history. No history of provocative testing. We'll obtain cardiac labs. Chest x-ray shows no significant change compared to prior. We'll obtain chest x-ray. We'll also obtain a d-dimer.  ED PROGRESS: 2:45 AM  Pt's abscess or unremarkable. Troponin is negative. D-dimer negative. Patient has a heart score of 4.  I feel she will need admission for chest pain rule out and a stress  test.  Patient has had some mild relief with nitroglycerin. We'll give morphine and reassess. Chest x-ray shows midlung left-sided opacity which could be pneumonia but there is a foreign body lying on her chest during this x-ray. She is not having fever or cough. No leukocytosis. I feel this is less likely pneumonia.   3:30 AM  discussed with Dr. Julian Reil who agrees on admission to telemetry, observation. I will place holding orders per his request. Care transferred to hospitalist service.     EKG Interpretation  Date/Time:  Tuesday April 28 2015 00:05:05 EST Ventricular Rate:  79 PR Interval:  137 QRS Duration: 98 QT Interval:  374 QTC Calculation: 429 R Axis:   37 Text Interpretation:  Sinus rhythm RSR' in V1 or V2, right VCD or RVH No significant change since last tracing Confirmed by WARD,  DO, KRISTEN ([id number]) on 04/28/2015 12:30:04 AM         I personally performed the services described in this documentation, which was scribed in my presence. The recorded information has been reviewed and is accurate.  Layla Maw Ward, DO 04/28/15 [phone]

## 2015-04-28 NOTE — ED Notes (Signed)
CBG check of 161

## 2015-04-29 ENCOUNTER — Observation Stay (HOSPITAL_BASED_OUTPATIENT_CLINIC_OR_DEPARTMENT_OTHER): Payer: No Typology Code available for payment source

## 2015-04-29 DIAGNOSIS — R072 Precordial pain: Secondary | ICD-10-CM | POA: Diagnosis not present

## 2015-04-29 DIAGNOSIS — E119 Type 2 diabetes mellitus without complications: Secondary | ICD-10-CM | POA: Diagnosis not present

## 2015-04-29 DIAGNOSIS — I10 Essential (primary) hypertension: Secondary | ICD-10-CM | POA: Diagnosis not present

## 2015-04-29 DIAGNOSIS — Z794 Long term (current) use of insulin: Secondary | ICD-10-CM

## 2015-04-29 DIAGNOSIS — R0600 Dyspnea, unspecified: Secondary | ICD-10-CM

## 2015-04-29 DIAGNOSIS — J189 Pneumonia, unspecified organism: Secondary | ICD-10-CM

## 2015-04-29 DIAGNOSIS — R079 Chest pain, unspecified: Secondary | ICD-10-CM | POA: Diagnosis not present

## 2015-04-29 LAB — BASIC METABOLIC PANEL
ANION GAP: 5 (ref 5–15)
BUN: 13 mg/dL (ref 6–20)
CALCIUM: 8.5 mg/dL — AB (ref 8.9–10.3)
CHLORIDE: 106 mmol/L (ref 101–111)
CO2: 25 mmol/L (ref 22–32)
Creatinine, Ser: 0.93 mg/dL (ref 0.44–1.00)
GFR calc Af Amer: >60 mL/min (ref >60)
GFR calc non Af Amer: >60 mL/min (ref >60)
GLUCOSE: 140 mg/dL — AB (ref 65–99)
Potassium: 4.3 mmol/L (ref 3.5–5.1)
Sodium: 136 mmol/L (ref 135–145)

## 2015-04-29 LAB — CBC
HEMATOCRIT: 33.6 % — AB (ref 36.0–46.0)
Hemoglobin: 10.9 g/dL — ABNORMAL LOW (ref 12.0–15.0)
MCH: 29 pg (ref 26.0–34.0)
MCHC: 32.4 g/dL (ref 30.0–36.0)
MCV: 89.4 fL (ref 78.0–100.0)
Platelets: 314 10*3/uL (ref 150–400)
RBC: 3.76 MIL/uL — ABNORMAL LOW (ref 3.87–5.11)
RDW: 14.6 % (ref 11.5–15.5)
WBC: 6.6 10*3/uL (ref 4.0–10.5)

## 2015-04-29 LAB — GLUCOSE, CAPILLARY
GLUCOSE-CAPILLARY: 250 mg/dL — AB (ref 65–99)
GLUCOSE-CAPILLARY: 264 mg/dL — AB (ref 65–99)
Glucose-Capillary: 99 mg/dL (ref 65–99)
Glucose-Capillary: 203 mg/dL — ABNORMAL HIGH (ref 65–99)

## 2015-04-29 LAB — MRSA PCR SCREENING: MRSA BY PCR: NEGATIVE

## 2015-04-29 MED ORDER — AZITHROMYCIN 250 MG PO TABS: ORAL_TABLET | ORAL | Status: DC

## 2015-04-29 NOTE — Progress Notes (Signed)
Inpatient Diabetes Program Recommendations  AACE/ADA: New Consensus Statement on Inpatient Glycemic Control (2015)  Target Ranges:  Prepandial:   less than 140 mg/dL      Peak postprandial:   less than 180 mg/dL (1-2 hours)      Critically ill patients:  140 - 180 mg/dL  Results for Mariah Miller, Mariah Miller (MRN [id number]) as of 04/29/2015 10:17  Ref. Range 04/28/2015 07:05 04/28/2015 08:14 04/28/2015 13:02 04/28/2015 16:28 04/28/2015 20:31 04/28/2015 23:22 04/29/2015 04:50 04/29/2015 07:38  Glucose-Capillary Latest Ref Range: 65-99 mg/dL 562 (H) 130 (H) 865 (H) 294 (H) 358 (H) 348 (H) 99 203 (H)   Review of Glycemic Control  Diabetes history: DM2 Outpatient Diabetes medications: 70/30 20 units BID, Metformin 1000 mg BID, Novolog 3-10 units TID with meals Current orders for Inpatient glycemic control: NPH 10 units BID, Novolog 0-9 units Q4H  Inpatient Diabetes Program Recommendations: Insulin - Basal: If patient is eating and tolerating diet, please consider discontinuing NPH and order 70/30 15 units BID (which will provide 21 units for basal and 9 units for meal coverage per day). Correction (SSI): If patient is eating and tolerating diet, please consider changing frequency of CBGs and Novolog correction to ACHS. HgbA1C: Please consider ordering an A1C to evalutate glycemic control over the past 2-3 months.  Thanks, Orlando Penner, RN, MSN, CDE Diabetes Coordinator Inpatient Diabetes Program [phone] (Team Pager from 8am to 5pm) [phone] ([facility] office) [phone] [facility] office) [phone] [facility] office)

## 2015-04-29 NOTE — Progress Notes (Signed)
  Echocardiogram 2D Echocardiogram has been performed.  Leta Jungling M 04/29/2015, 3:16 PM

## 2015-04-29 NOTE — Discharge Summary (Signed)
Discharge Summary  Mariah Miller:[id number] DOB: Jul 31, 1975  PCP: Jeanann Lewandowsky, MD  Admit date: 04/27/2015 Discharge date: 04/29/2015  Time spent: <74mins  Recommendations for Outpatient Follow-up:  1. F/u with PMD on 2/23 for hospital discharge follow up, patient will need clearance from pmd to return to work.   Discharge Diagnoses:  Active Hospital Problems   Diagnosis Date Noted  . Chest pain 04/28/2015  . DM2 (diabetes mellitus, type 2) (HCC) 04/28/2015  . Essential hypertension, benign 01/22/2013    Resolved Hospital Problems   Diagnosis Date Noted Date Resolved  No resolved problems to display.    Discharge Condition: stable  Diet recommendation: heart healthy/carb modified  Filed Weights   04/28/15 1507 04/29/15 0453  Weight: 91.218 kg (201 lb 1.6 oz) 91.491 kg (201 lb 11.2 oz)    History of present illness:  Mariah Miller is a 40 y.o. female with h/o HTN, DM. Patient presents to the ED with c/o constant, ongoing, substernal CP. Symptoms onset 7:30 AM yesterday while working. Nothing makes symptoms better or worse, not clear if they are exertional. There is associated SOB, nausea and vomiting. Former smoker as well. Has "heart issues" in family but unclear if these are related to CAD. No h/o blood clots, no exogenous estrogen. Questionable episode of syncope. No fevers, no cough.  Hospital Course:  Principal Problem:   Chest pain Active Problems:   Essential hypertension, benign   DM2 (diabetes mellitus, type 2) (HCC) 1-Chest pain:  Dyspnea; atypical. She does has risk factors, DM, HTN, Obesity. Cardiology consulted.  -Suspect related to vital PNA. Negative for influenza.  Troponin times 2 negative.  ECHO unremarkable EKG with r'sr V 1 V 2.  Cardiology cleared patient to be discharged home  2-Viral illness vs CAP;  report cough, runny nose, sick contact. Negative for influenza.  Chest x ray with minimal left mid lung opacity  . With back ground on uncontrolled diabetes, will treat with zithromax for total of 5 days, patient expressed understanding  3-DM; on NPH BID. Close follow up with pmd for blood sugar control  4-HNT; lisinopril, HCTZ  Procedures:  echocardiogram  Consultations:  cardioloyg  Discharge Exam: BP 121/53 mmHg  Pulse 72  Temp(Src) 99.4 F (37.4 C) (Oral)  Resp 20  Ht  (1.626 m)  Wt 91.491 kg (201 lb 11.2 oz)  BMI 34.60 kg/m2  SpO2 98%  LMP 04/06/2015 (Approximate)  General: aaox3 Cardiovascular: RRR Respiratory: CTABL Extremity: no edema  Discharge Instructions You were cared for by a hospitalist during your hospital stay. If you have any questions about your discharge medications or the care you received while you were in the hospital after you are discharged, you can call the unit and asked to speak with the hospitalist on call if the hospitalist that took care of you is not available. Once you are discharged, your primary care physician will handle any further medical issues. Please note that NO REFILLS for any discharge medications will be authorized once you are discharged, as it is imperative that you return to your primary care physician (or establish a relationship with a primary care physician if you do not have one) for your aftercare needs so that they can reassess your need for medications and monitor your lab values.      Discharge Instructions    Diet - low sodium heart healthy    Complete by:  As directed   Carb modified     Increase activity slowly  Complete by:  As directed             Medication List    TAKE these medications        aspirin EC 81 MG tablet  Take 1 tablet (81 mg total) by mouth daily.     azithromycin 250 MG tablet  Commonly known as:  ZITHROMAX Z-PAK  Take 500mg  po on day one, then 250mg  po daily for 4 days from day 2 to finish total of 5 days treatment.     ferrous sulfate 325 (65 FE) MG tablet  Take 1 tablet (325 mg  total) by mouth 2 (two) times daily with a meal.     gabapentin 300 MG capsule  Commonly known as:  NEURONTIN  Take 1 capsule (300 mg total) by mouth 3 (three) times daily.     insulin aspart 100 UNIT/ML injection  Commonly known as:  novoLOG  Inject 3-10 Units into the skin 3 (three) times daily as needed for high blood sugar. 150 - 200 = 3 units, 201 - 250 = 5 units, 251 - 300 = 7 units 301 - 350 = 10 units, > 350, call or come to clinic     insulin NPH-regular Human (70-30) 100 UNIT/ML injection  Commonly known as:  NOVOLIN 70/30  Inject 20 Units into the skin 2 (two) times daily.     lisinopril-hydrochlorothiazide 20-12.5 MG tablet  Commonly known as:  PRINZIDE,ZESTORETIC  Take 1 tablet by mouth daily.     metFORMIN 1000 MG tablet  Commonly known as:  GLUCOPHAGE  Take 1 tablet (1,000 mg total) by mouth 2 (two) times daily with a meal.     simvastatin 10 MG tablet  Commonly known as:  ZOCOR  Take 1 tablet (10 mg total) by mouth at bedtime.       No Known Allergies Follow-up Information    Follow up with Jeanann Lewandowsky, MD On 04/30/2015.   Specialty:  Internal Medicine   Why:  hospital discharge follow up   Contact information:   [address] [place] [address] [phone]        The results of significant diagnostics from this hospitalization (including imaging, microbiology, ancillary and laboratory) are listed below for reference.    Significant Diagnostic Studies: Dg Chest 2 View  04/28/2015  CLINICAL DATA:  Acute onset of left-sided chest pain and shortness of breath. Initial encounter. EXAM: CHEST  2 VIEW COMPARISON:  Chest radiograph from 12/23/2014 FINDINGS: The lungs are well-aerated. Minimal left midlung opacity raises concern for pneumonia. There appears to be an overlying hair scrunchy, limiting evaluation of the left midlung. There is no evidence of pleural effusion or pneumothorax. The heart is normal in size; the mediastinal contour is within  normal limits. No acute osseous abnormalities are seen. IMPRESSION: Minimal left midlung opacity raises concern for pneumonia. There appears to be an overlying hair scrunchy, limiting evaluation of the left midlung. Electronically Signed   By: Roanna Raider M.D.   On: 04/28/2015 01:09    Microbiology: Recent Results (from the past 240 hour(s))  MRSA PCR Screening     Status: None   Collection Time: 04/29/15 10:34 AM  Result Value Ref Range Status   MRSA by PCR NEGATIVE NEGATIVE Final    Comment:        The GeneXpert MRSA Assay (FDA approved for NASAL specimens only), is one component of a comprehensive MRSA colonization surveillance program. It is not intended to diagnose MRSA infection nor to  guide or monitor treatment for MRSA infections.      Labs: Basic Metabolic Panel:  Recent Labs Lab 04/28/15 0038 04/28/15 1511 04/29/15 0547  NA 135  --  136  K 4.1  --  4.3  CL 105  --  106  CO2 21*  --  25  GLUCOSE 256*  --  140*  BUN 9  --  13  CREATININE 0.93  --  0.93  CALCIUM 9.2  --  8.5*  MG  --  1.6*  --    Liver Function Tests: No results for input(s): AST, ALT, ALKPHOS, BILITOT, PROT, ALBUMIN in the last 168 hours. No results for input(s): LIPASE, AMYLASE in the last 168 hours. No results for input(s): AMMONIA in the last 168 hours. CBC:  Recent Labs Lab 04/28/15 0038 04/29/15 0547  WBC 6.2 6.6  HGB 11.1* 10.9*  HCT 33.7* 33.6*  MCV 88.7 89.4  PLT 303 314   Cardiac Enzymes:  Recent Labs Lab 04/28/15 0512 04/28/15 0754 04/28/15 1511  TROPONINI <0.03 <0.03 <0.03   BNP: BNP (last 3 results) No results for input(s): BNP in the last 8760 hours.  ProBNP (last 3 results) No results for input(s): PROBNP in the last 8760 hours.  CBG:  Recent Labs Lab 04/28/15 2322 04/29/15 0450 04/29/15 0738 04/29/15 1140 04/29/15 1611  GLUCAP 348* 99 203* 250* 264*       Signed:  Inesha Sow MD, PhD  [facility] 04/29/2015, 5:22 PM

## 2015-04-29 NOTE — Care Management Note (Signed)
Case Management Note  Patient Details  Name: Mariah Miller MRN: [id number] Date of Birth: 1975-03-27  Subjective/Objective: Pt admitted for chest pain. Pt is without insurance, however PCP is Dr. Hyman Hopes at the [facility].                     Action/Plan: CM did call the Clinic and pt has a Hospital f/u 04-30-15 @ 9:00 am. Hopefully pt can be d/c today in order to get to that scheduled appointment. No further needs from CM at this time.    Expected Discharge Date:                  Expected Discharge Plan:  Home/Self Care  In-House Referral:  NA  Discharge planning Services  CM Consult, [facility], Follow-up appt scheduled, Medication Assistance  Post Acute Care Choice:  NA Choice offered to:  NA  DME Arranged:  N/A DME Agency:  NA  HH Arranged:  NA HH Agency:  NA  Status of Service:  Completed, signed off  Medicare Important Message Given:    Date Medicare IM Given:    Medicare IM give by:    Date Additional Medicare IM Given:    Additional Medicare Important Message give by:     If discussed at Long Length of Stay Meetings, dates discussed:    Additional Comments:  Gala Lewandowsky, RN 04/29/2015, 11:40 AM

## 2015-04-29 NOTE — Progress Notes (Signed)
     SUBJECTIVE: Chest pain has resolved.   BP 111/50 mmHg  Pulse 86  Temp(Src) 99.2 F (37.3 C) (Oral)  Resp 18  Ht  (1.626 m)  Wt 201 lb 11.2 oz (91.491 kg)  BMI 34.60 kg/m2  SpO2 100%  LMP 04/06/2015 (Approximate)  Intake/Output Summary (Last 24 hours) at 04/29/15 1216 Last data filed at 04/29/15 0900  Gross per 24 hour  Intake   1220 ml  Output      0 ml  Net   1220 ml    PHYSICAL EXAM General: Well developed, well nourished, in no acute distress. Alert and oriented x 3.  Psych:  Good affect, responds appropriately Neck: No JVD. No masses noted.  Lungs: Clear bilaterally with no wheezes or rhonci noted.  Heart: RRR with no murmurs noted. Abdomen: Bowel sounds are present. Soft, non-tender.  Extremities: No lower extremity edema.   LABS: Basic Metabolic Panel:  Recent Labs  25/95/63 0038 04/28/15 1511 04/29/15 0547  NA 135  --  136  K 4.1  --  4.3  CL 105  --  106  CO2 21*  --  25  GLUCOSE 256*  --  140*  BUN 9  --  13  CREATININE 0.93  --  0.93  CALCIUM 9.2  --  8.5*  MG  --  1.6*  --    CBC:  Recent Labs  04/28/15 0038 04/29/15 0547  WBC 6.2 6.6  HGB 11.1* 10.9*  HCT 33.7* 33.6*  MCV 88.7 89.4  PLT 303 314   Cardiac Enzymes:  Recent Labs  04/28/15 0512 04/28/15 0754 04/28/15 1511  TROPONINI <0.03 <0.03 <0.03   Current Meds: . aspirin  324 mg Oral Once  . aspirin EC  81 mg Oral Daily  . enoxaparin (LOVENOX) injection  40 mg Subcutaneous Q24H  . lisinopril  20 mg Oral Daily   And  . hydrochlorothiazide  12.5 mg Oral Daily  . insulin aspart  0-9 Units Subcutaneous 6 times per day  . insulin NPH Human  10 Units Subcutaneous BID AC & HS     ASSESSMENT AND PLAN: 40 yo female with HTN, DM, 4 years of former tobacco use, migraines, prior seizures was admitted with persistent chest pain/dyspnea and syncope, felt to have viral PNA.  1. Chest pain/dyspnea: This does not sound cardiac related. I have reviewed all of her labs. I have  reviewed her telemetry. Troponin negative x 3 despite hours of chest pain. Does not sound like pericarditis either. EKG without ischemic changes. Suspect this is related to either a pulmonary process or musculoskeletal cause. Echo pending. If Echo is normal, can d/c home today. She will not need cardiac follow up unless echo is abnormal.    Mariah Miller  2/22/201712:16 PM

## 2015-04-30 ENCOUNTER — Ambulatory Visit: Payer: No Typology Code available for payment source | Attending: Internal Medicine | Admitting: Internal Medicine

## 2015-04-30 ENCOUNTER — Encounter: Payer: Self-pay | Admitting: Internal Medicine

## 2015-04-30 VITALS — BP 157/90 | HR 96 | Temp 98.5°F | Resp 18 | Ht 64.0 in | Wt 199.0 lb

## 2015-04-30 DIAGNOSIS — E1142 Type 2 diabetes mellitus with diabetic polyneuropathy: Secondary | ICD-10-CM

## 2015-04-30 DIAGNOSIS — Z794 Long term (current) use of insulin: Secondary | ICD-10-CM | POA: Diagnosis not present

## 2015-04-30 DIAGNOSIS — Z87891 Personal history of nicotine dependence: Secondary | ICD-10-CM | POA: Insufficient documentation

## 2015-04-30 DIAGNOSIS — I10 Essential (primary) hypertension: Secondary | ICD-10-CM | POA: Diagnosis present

## 2015-04-30 DIAGNOSIS — G4733 Obstructive sleep apnea (adult) (pediatric): Secondary | ICD-10-CM | POA: Diagnosis not present

## 2015-04-30 DIAGNOSIS — E1165 Type 2 diabetes mellitus with hyperglycemia: Secondary | ICD-10-CM | POA: Diagnosis not present

## 2015-04-30 DIAGNOSIS — D509 Iron deficiency anemia, unspecified: Secondary | ICD-10-CM | POA: Diagnosis not present

## 2015-04-30 DIAGNOSIS — E785 Hyperlipidemia, unspecified: Secondary | ICD-10-CM

## 2015-04-30 DIAGNOSIS — Z79899 Other long term (current) drug therapy: Secondary | ICD-10-CM | POA: Insufficient documentation

## 2015-04-30 DIAGNOSIS — Z7982 Long term (current) use of aspirin: Secondary | ICD-10-CM | POA: Diagnosis not present

## 2015-04-30 DIAGNOSIS — R42 Dizziness and giddiness: Secondary | ICD-10-CM | POA: Diagnosis not present

## 2015-04-30 LAB — GLUCOSE, POCT (MANUAL RESULT ENTRY): POC Glucose: 240 mg/dl — AB (ref 70–99)

## 2015-04-30 LAB — POCT GLYCOSYLATED HEMOGLOBIN (HGB A1C): Hemoglobin A1C: 11.4

## 2015-04-30 MED ORDER — METFORMIN HCL 1000 MG PO TABS: 1000.0000 mg | ORAL_TABLET | Freq: Two times a day (BID) | ORAL | Status: DC

## 2015-04-30 MED ORDER — GABAPENTIN 300 MG PO CAPS: 300.0000 mg | ORAL_CAPSULE | Freq: Three times a day (TID) | ORAL | Status: DC

## 2015-04-30 MED ORDER — FERROUS SULFATE 325 (65 FE) MG PO TABS: 325.0000 mg | ORAL_TABLET | Freq: Two times a day (BID) | ORAL | Status: DC

## 2015-04-30 MED ORDER — LISINOPRIL-HYDROCHLOROTHIAZIDE 20-12.5 MG PO TABS: 1.0000 | ORAL_TABLET | Freq: Every day | ORAL | Status: DC

## 2015-04-30 MED ORDER — INSULIN NPH ISOPHANE & REGULAR (70-30) 100 UNIT/ML ~~LOC~~ SUSP: 30.0000 [IU] | Freq: Two times a day (BID) | SUBCUTANEOUS | Status: DC

## 2015-04-30 MED ORDER — SIMVASTATIN 10 MG PO TABS: 10.0000 mg | ORAL_TABLET | Freq: Every day | ORAL | Status: DC

## 2015-04-30 MED ORDER — INSULIN ASPART 100 UNIT/ML ~~LOC~~ SOLN: 3.0000 [IU] | Freq: Three times a day (TID) | SUBCUTANEOUS | Status: DC | PRN

## 2015-04-30 MED FILL — ?SIMVASTATIN 10 MG TABLET: 10 MG | 30 days supply | Qty: 30 | Fill #0

## 2015-04-30 MED FILL — LISINOPRIL-HCTZ 20-12.5 MG: 20-12.5 | 30 days supply | Qty: 30 | Fill #0

## 2015-04-30 MED FILL — !NOVOLOG 100UNITS/ML VIAL: 100/ML | 30 days supply | Qty: 10 | Fill #0

## 2015-04-30 MED FILL — GABAPENTIN 300 MG CAPSULE: 300 | 30 days supply | Qty: 90 | Fill #0

## 2015-04-30 MED FILL — NOVOLIN 70/30 100 UNITS/ML: (70-30) 100 | 33 days supply | Qty: 20 | Fill #0

## 2015-04-30 MED FILL — metFORMIN HCL 1000 MG TABS: 1000 | 30 days supply | Qty: 60 | Fill #0

## 2015-04-30 MED FILL — FERROUS SULFATE 325 MG TAB: 325 (65 FE) | 45 days supply | Qty: 90 | Fill #0

## 2015-04-30 MED FILL — ?AZITHROMYCIN 250 MG TABLET: 250 MG | 5 days supply | Qty: 6 | Fill #0

## 2015-04-30 NOTE — Progress Notes (Signed)
Patient is here for FU DM  Patient complains of chest pain being present. Scaled currently at a 5. Pain decreases when patient lays on her left side.

## 2015-04-30 NOTE — Progress Notes (Signed)
Mariah Miller, is a 40 y.o. female  RUE:[id number]  JYN:[id number]  DOB - 12-22-1975  CC:  Chief Complaint  Patient presents with  . Follow-up    DM       HPI: [name] is a 40 y.o. female here today for follow up visit from the hospital. Patient has hypertension, diabetes mellitus uncontrolled, dyslipidemia. Patient was seen in the ED and admitted for chest pain on 04/27/2015. Cardiovascular workup was negative (Troponin x 3, 2 D echo and ECG all WNL). Patient was diagnosed with probable viral pneumonia and discharged on Azithromycin to start today. Patient also has continued elevated blood sugars and describes ranges as 130 to 250. Patient states she is compliant with all medications. Patient with additional complaint of insomnia, trouble falling asleep and staying asleep.  Patient has No headache, No chest pain today, No abdominal pain - No Nausea, No new weakness tingling or numbness, No Cough - SOB. She works night shift and its difficult to adjust her medications accordingly. She sometimes feels tired and light headed late into her shift. She denied being depressed, denied suicidal thoughts or ideation.  No Known Allergies Past Medical History  Diagnosis Date  . Hypertension   . Diabetes mellitus without complication (HCC)   . Migraine   . Seizures (HCC)     self reported   Current Outpatient Prescriptions on File Prior to Visit  Medication Sig Dispense Refill  . aspirin EC 81 MG tablet Take 1 tablet (81 mg total) by mouth daily. 30 tablet 5  . azithromycin (ZITHROMAX Z-PAK) 250 MG tablet Take 500mg  po on day one, then 250mg  po daily for 4 days from day 2 to finish total of 5 days treatment. 6 each 0   No current facility-administered medications on file prior to visit.   Family History  Problem Relation Age of Onset  . Hypertension Mother   . Thyroid disease Mother   . Bipolar disorder Sister   . Irregular heart beat Mother    Social History   Social  History  . Marital Status: Divorced    Spouse Name: N/A  . Number of Children: N/A  . Years of Education: N/A   Occupational History  .      Assembler    Social History Main Topics  . Smoking status: Former Games developer  . Smokeless tobacco: Not on file     Comment: Smoked for 4 years, quit age 20  . Alcohol Use: 0.0 oz/week    0 Standard drinks or equivalent per week     Comment: Holidays only  . Drug Use: No  . Sexual Activity: Yes    Birth Control/ Protection: None   Other Topics Concern  . Not on file   Social History Narrative    Review of Systems: Constitutional: Negative for fever, chills, diaphoresis, activity change, appetite change and fatigue. HENT: Negative for ear pain, nosebleeds, congestion, facial swelling, rhinorrhea, neck pain, neck stiffness and ear discharge.  Eyes: Negative for pain, discharge, redness, itching and visual disturbance. Respiratory: Negative for cough, choking, chest tightness, shortness of breath, wheezing and stridor.  Cardiovascular: Negative for chest pain, palpitations and leg swelling. Gastrointestinal: Negative for abdominal distention. Genitourinary: Negative for dysuria, urgency, frequency, hematuria, flank pain, decreased urine volume, difficulty urinating and dyspareunia.  Musculoskeletal: Negative for back pain, joint swelling, arthralgia and gait problem. Neurological: Negative for dizziness, tremors, seizures, syncope, facial asymmetry, speech difficulty, weakness, light-headedness, numbness and headaches.  Hematological: Negative for adenopathy. Does not bruise/bleed  easily. Psychiatric/Behavioral: Negative for hallucinations, behavioral problems, confusion, dysphoric mood, decreased concentration and agitation.    Objective:   Filed Vitals:   04/30/15 0936  BP: 157/90  Pulse: 96  Temp: 98.5 F (36.9 C)  Resp: 18    Physical Exam: Constitutional: Patient appears well-developed and well-nourished. obese HENT:  Normocephalic, atraumatic, External right and left ear normal. Oropharynx is clear and moist.  Eyes: Conjunctivae and EOM are normal. PERRLA, no scleral icterus. Neck: Normal ROM. Neck supple. No JVD. No tracheal deviation. No thyromegaly. CVS: RRR, S1/S2 +, no murmurs, no gallops, no carotid bruit.  Pulmonary: Effort and breath sounds normal, no stridor, rhonchi, wheezes, rales.  Abdominal: Soft. BS +, no distension, tenderness, rebound or guarding.  Musculoskeletal: Normal range of motion. No edema and no tenderness.  Lymphadenopathy: No lymphadenopathy noted. Neuro: Alert & Oriented x 4 Skin: Skin is warm and dry. No rash noted. Not diaphoretic. No erythema. No pallor. Psychiatric: Normal mood and affect. Behavior, judgment, thought content normal.  Lab Results  Component Value Date   WBC 6.6 04/29/2015   HGB 10.9* 04/29/2015   HCT 33.6* 04/29/2015   MCV 89.4 04/29/2015   PLT 314 04/29/2015   Lab Results  Component Value Date   CREATININE 0.93 04/29/2015   BUN 13 04/29/2015   NA 136 04/29/2015   K 4.3 04/29/2015   CL 106 04/29/2015   CO2 25 04/29/2015    Lab Results  Component Value Date   HGBA1C 11.4 04/30/2015   Lipid Panel     Component Value Date/Time   CHOL 169 03/17/2014 1029   TRIG 98 03/17/2014 1029   HDL 44 03/17/2014 1029   CHOLHDL 3.8 03/17/2014 1029   VLDL 20 03/17/2014 1029   LDLCALC 105* 03/17/2014 1029   Lab Results  Component Value Date   HGBA1C 11.4 04/30/2015   HGBA1C 8.0 03/17/2014   HGBA1C 8.2 09/13/2013       Assessment and plan:   Azani was seen today for follow-up.  Diagnoses and all orders for this visit:  Type 2 diabetes mellitus with diabetic polyneuropathy, with long-term current use of insulin (HCC) -     POCT A1C has gone up to 11.4 from 8.0 -     Glucose (CBG) -     Microalbumin/Creatinine Ratio, Urine  -     gabapentin (NEURONTIN) 300 MG capsule; Take 1 capsule (300 mg total) by mouth 3 (three) times daily. Increase  insulin to 30 units bid from 20 units  -     insulin NPH-regular Human (NOVOLIN 70/30) (70-30) 100 UNIT/ML injection; Inject 30 Units into the skin 2 (two) times daily. -     insulin aspart (NOVOLOG) 100 UNIT/ML injection; Inject 3-10 Units into the skin 3 (three) times daily as needed for high blood sugar. 150 - 200 = 3 units, 201 - 250 = 5 units, 251 - 300 = 7 units 301 - 350 = 10 units, > 350, call or come to clinic -     metFORMIN (GLUCOPHAGE) 1000 MG tablet; Take 1 tablet (1,000 mg total) by mouth 2 (two) times daily with a meal.  Aim for 30 minutes of exercise most days. Rethink what you drink.  Water is great! Aim for 2-3 Carb Choices per meal (30-45 grams) +/- 1 either way   Aim for 0-15 Carbs per snack if hungry   Include protein in moderation with your meals and snacks   Consider reading food labels for Total Carbohydrate and Fat Grams of  foods   Consider checking BG at alternate times per day   Continue taking medication as directed Be mindful about how much sugar you are adding to beverages and other foods.  Fruit Punch - find one with no sugar   Measure and decrease portions of carbohydrate foods   Make your plate and don't go back for seconds  Essential hypertension  -     lisinopril-hydrochlorothiazide (PRINZIDE,ZESTORETIC) 20-12.5 MG tablet; Take 1 tablet by mouth daily.  We have discussed target BP range and blood pressure goal - I have advised patient to check BP regularly and to call us back or report to clinic if the numbers are consistently higher than 140/90   - We discussed the importance of compliance with medical therapy and DASH diet recommended, consequences of uncontrolled hypertension discussed.   - continue current BP medications  Dyslipidemia -     simvastatin (ZOCOR) 10 MG tablet; Take 1 tablet (10 mg total) by mouth at bedtime.  To address this please limit saturated fat to no more than 7% of your calories, limit cholesterol to 200 mg/day, increase fiber  and exercise as tolerated. If needed we may add another cholesterol lowering medication to your regimen.   OSA (obstructive sleep apnea)  -     Split night study; Future  Anemia, iron deficiency -     ferrous sulfate 325 (65 FE) MG tablet; Take 1 tablet (325 mg total) by mouth 2 (two) times daily with a meal.    Return in about 2 weeks (around 05/14/2015) for CPP [facility] for BP/CBG and DM management.  The patient was given clear instructions to go to ER or return to medical center if symptoms don't improve, worsen or new problems develop. The patient verbalized understanding. The patient was told to call to get lab results if they haven't heard anything in the next week.     Stephanie Coup, AGNP-Student [facility] [phone] 04/30/2015, 12:04 PM   Evaluation and management procedures were performed by the Advanced Practitioner under my supervision and collaboration. I have reviewed the Advanced Practitioner's note and chart, and I agree with the management and plan.   Jeanann Lewandowsky, MD, MHA, CPE, FACP, FAAP [facility] [place], [place] [phone]   04/30/2015, 6:11 PM

## 2015-04-30 NOTE — Patient Instructions (Signed)
DASH Eating Plan DASH stands for "Dietary Approaches to Stop Hypertension." The DASH eating plan is a healthy eating plan that has been shown to reduce high blood pressure (hypertension). Additional health benefits may include reducing the risk of type 2 diabetes mellitus, heart disease, and stroke. The DASH eating plan may also help with weight loss. WHAT DO I NEED TO KNOW ABOUT THE DASH EATING PLAN? For the DASH eating plan, you will follow these general guidelines:  Choose foods with a percent daily value for sodium of less than 5% (as listed on the food label).  Use salt-free seasonings or herbs instead of table salt or sea salt.  Check with your health care provider or pharmacist before using salt substitutes.  Eat lower-sodium products, often labeled as "lower sodium" or "no salt added."  Eat fresh foods.  Eat more vegetables, fruits, and low-fat dairy products.  Choose whole grains. Look for the word "whole" as the first word in the ingredient list.  Choose fish and skinless chicken or turkey more often than red meat. Limit fish, poultry, and meat to 6 oz (170 g) each day.  Limit sweets, desserts, sugars, and sugary drinks.  Choose heart-healthy fats.  Limit cheese to 1 oz (28 g) per day.  Eat more home-cooked food and less restaurant, buffet, and fast food.  Limit fried foods.  Cook foods using methods other than frying.  Limit canned vegetables. If you do use them, rinse them well to decrease the sodium.  When eating at a restaurant, ask that your food be prepared with less salt, or no salt if possible. WHAT FOODS CAN I EAT? Seek help from a dietitian for individual calorie needs. Grains Whole grain or whole wheat bread. Brown rice. Whole grain or whole wheat pasta. Quinoa, bulgur, and whole grain cereals. Low-sodium cereals. Corn or whole wheat flour tortillas. Whole grain cornbread. Whole grain crackers. Low-sodium crackers. Vegetables Fresh or frozen vegetables  (raw, steamed, roasted, or grilled). Low-sodium or reduced-sodium tomato and vegetable juices. Low-sodium or reduced-sodium tomato sauce and paste. Low-sodium or reduced-sodium canned vegetables.  Fruits All fresh, canned (in natural juice), or frozen fruits. Meat and Other Protein Products Ground beef (85% or leaner), grass-fed beef, or beef trimmed of fat. Skinless chicken or turkey. Ground chicken or turkey. Pork trimmed of fat. All fish and seafood. Eggs. Dried beans, peas, or lentils. Unsalted nuts and seeds. Unsalted canned beans. Dairy Low-fat dairy products, such as skim or 1% milk, 2% or reduced-fat cheeses, low-fat ricotta or cottage cheese, or plain low-fat yogurt. Low-sodium or reduced-sodium cheeses. Fats and Oils Tub margarines without trans fats. Light or reduced-fat mayonnaise and salad dressings (reduced sodium). Avocado. Safflower, olive, or canola oils. Natural peanut or almond butter. Other Unsalted popcorn and pretzels. The items listed above may not be a complete list of recommended foods or beverages. Contact your dietitian for more options. WHAT FOODS ARE NOT RECOMMENDED? Grains White bread. White pasta. White rice. Refined cornbread. Bagels and croissants. Crackers that contain trans fat. Vegetables Creamed or fried vegetables. Vegetables in a cheese sauce. Regular canned vegetables. Regular canned tomato sauce and paste. Regular tomato and vegetable juices. Fruits Dried fruits. Canned fruit in light or heavy syrup. Fruit juice. Meat and Other Protein Products Fatty cuts of meat. Ribs, chicken wings, bacon, sausage, bologna, salami, chitterlings, fatback, hot dogs, bratwurst, and packaged luncheon meats. Salted nuts and seeds. Canned beans with salt. Dairy Whole or 2% milk, cream, half-and-half, and cream cheese. Whole-fat or sweetened yogurt. Full-fat   cheeses or blue cheese. Nondairy creamers and whipped toppings. Processed cheese, cheese spreads, or cheese  curds. Condiments Onion and garlic salt, seasoned salt, table salt, and sea salt. Canned and packaged gravies. Worcestershire sauce. Tartar sauce. Barbecue sauce. Teriyaki sauce. Soy sauce, including reduced sodium. Steak sauce. Fish sauce. Oyster sauce. Cocktail sauce. Horseradish. Ketchup and mustard. Meat flavorings and tenderizers. Bouillon cubes. Hot sauce. Tabasco sauce. Marinades. Taco seasonings. Relishes. Fats and Oils Butter, stick margarine, lard, shortening, ghee, and bacon fat. Coconut, palm kernel, or palm oils. Regular salad dressings. Other Pickles and olives. Salted popcorn and pretzels. The items listed above may not be a complete list of foods and beverages to avoid. Contact your dietitian for more information. WHERE CAN I FIND MORE INFORMATION? [employer], [employer]: www.nhlbi.nih.gov/health/health-topics/topics/dash/   This information is not intended to replace advice given to you by your health care provider. Make sure you discuss any questions you have with your health care provider.   Document Released: 02/10/2011 Document Revised: 03/14/2014 Document Reviewed: 12/26/2012 Elsevier Interactive Patient Education 2016 [employer] Hypertension Hypertension, commonly called high blood pressure, is when the force of blood pumping through your arteries is too strong. Your arteries are the blood vessels that carry blood from your heart throughout your body. A blood pressure reading consists of a higher number over a lower number, such as 110/72. The higher number (systolic) is the pressure inside your arteries when your heart pumps. The lower number (diastolic) is the pressure inside your arteries when your heart relaxes. Ideally you want your blood pressure below 120/80. Hypertension forces your heart to work harder to pump blood. Your arteries may become narrow or stiff. Having untreated or uncontrolled hypertension can cause heart attack, stroke, kidney  disease, and other problems. RISK FACTORS Some risk factors for high blood pressure are controllable. Others are not.  Risk factors you cannot control include:   Race. You may be at higher risk if you are African American.  Age. Risk increases with age.  Gender. Men are at higher risk than women before age 45 years. After age 65, women are at higher risk than men. Risk factors you can control include:  Not getting enough exercise or physical activity.  Being overweight.  Getting too much fat, sugar, calories, or salt in your diet.  Drinking too much alcohol. SIGNS AND SYMPTOMS Hypertension does not usually cause signs or symptoms. Extremely high blood pressure (hypertensive crisis) may cause headache, anxiety, shortness of breath, and nosebleed. DIAGNOSIS To check if you have hypertension, your health care provider will measure your blood pressure while you are seated, with your arm held at the level of your heart. It should be measured at least twice using the same arm. Certain conditions can cause a difference in blood pressure between your right and left arms. A blood pressure reading that is higher than normal on one occasion does not mean that you need treatment. If it is not clear whether you have high blood pressure, you may be asked to return on a different day to have your blood pressure checked again. Or, you may be asked to monitor your blood pressure at home for 1 or more weeks. TREATMENT Treating high blood pressure includes making lifestyle changes and possibly taking medicine. Living a healthy lifestyle can help lower high blood pressure. You may need to change some of your habits. Lifestyle changes may include:  Following the DASH diet. This diet is high in fruits, vegetables, and whole grains.   It is low in salt, red meat, and added sugars.  Keep your sodium intake below 2,300 mg per day.  Getting at least 30-45 minutes of aerobic exercise at least 4 times per  week.  Losing weight if necessary.  Not smoking.  Limiting alcoholic beverages.  Learning ways to reduce stress. Your health care provider may prescribe medicine if lifestyle changes are not enough to get your blood pressure under control, and if one of the following is true:  You are 18-59 years of age and your systolic blood pressure is above 140.  You are 60 years of age or older, and your systolic blood pressure is above 150.  Your diastolic blood pressure is above 90.  You have diabetes, and your systolic blood pressure is over 140 or your diastolic blood pressure is over 90.  You have kidney disease and your blood pressure is above 140/90.  You have heart disease and your blood pressure is above 140/90. Your personal target blood pressure may vary depending on your medical conditions, your age, and other factors. HOME CARE INSTRUCTIONS  Have your blood pressure rechecked as directed by your health care provider.   Take medicines only as directed by your health care provider. Follow the directions carefully. Blood pressure medicines must be taken as prescribed. The medicine does not work as well when you skip doses. Skipping doses also puts you at risk for problems.  Do not smoke.   Monitor your blood pressure at home as directed by your health care provider. SEEK MEDICAL CARE IF:   You think you are having a reaction to medicines taken.  You have recurrent headaches or feel dizzy.  You have swelling in your ankles.  You have trouble with your vision. SEEK IMMEDIATE MEDICAL CARE IF:  You develop a severe headache or confusion.  You have unusual weakness, numbness, or feel faint.  You have severe chest or abdominal pain.  You vomit repeatedly.  You have trouble breathing. MAKE SURE YOU:   Understand these instructions.  Will watch your condition.  Will get help right away if you are not doing well or get worse.   This information is not intended to  replace advice given to you by your health care provider. Make sure you discuss any questions you have with your health care provider.   Document Released: 02/21/2005 Document Revised: 07/08/2014 Document Reviewed: 12/14/2012 Elsevier Interactive Patient Education 2016 [employer] Diabetes and Foot Care Diabetes may cause you to have problems because of poor blood supply (circulation) to your feet and legs. This may cause the skin on your feet to become thinner, break easier, and heal more slowly. Your skin may become dry, and the skin may peel and crack. You may also have nerve damage in your legs and feet causing decreased feeling in them. You may not notice minor injuries to your feet that could lead to infections or more serious problems. Taking care of your feet is one of the most important things you can do for yourself.  HOME CARE INSTRUCTIONS  Wear shoes at all times, even in the house. Do not go barefoot. Bare feet are easily injured.  Check your feet daily for blisters, cuts, and redness. If you cannot see the bottom of your feet, use a mirror or ask someone for help.  Wash your feet with warm water (do not use hot water) and mild soap. Then pat your feet and the areas between your toes until they are completely dry. Do   not soak your feet as this can dry your skin.  Apply a moisturizing lotion or petroleum jelly (that does not contain alcohol and is unscented) to the skin on your feet and to dry, brittle toenails. Do not apply lotion between your toes.  Trim your toenails straight across. Do not dig under them or around the cuticle. File the edges of your nails with an emery board or nail file.  Do not cut corns or calluses or try to remove them with medicine.  Wear clean socks or stockings every day. Make sure they are not too tight. Do not wear knee-high stockings since they may decrease blood flow to your legs.  Wear shoes that fit properly and have enough cushioning. To break  in new shoes, wear them for just a few hours a day. This prevents you from injuring your feet. Always look in your shoes before you put them on to be sure there are no objects inside.  Do not cross your legs. This may decrease the blood flow to your feet.  If you find a minor scrape, cut, or break in the skin on your feet, keep it and the skin around it clean and dry. These areas may be cleansed with mild soap and water. Do not cleanse the area with peroxide, alcohol, or iodine.  When you remove an adhesive bandage, be sure not to damage the skin around it.  If you have a wound, look at it several times a day to make sure it is healing.  Do not use heating pads or hot water bottles. They may burn your skin. If you have lost feeling in your feet or legs, you may not know it is happening until it is too late.  Make sure your health care provider performs a complete foot exam at least annually or more often if you have foot problems. Report any cuts, sores, or bruises to your health care provider immediately. SEEK MEDICAL CARE IF:   You have an injury that is not healing.  You have cuts or breaks in the skin.  You have an ingrown nail.  You notice redness on your legs or feet.  You feel burning or tingling in your legs or feet.  You have pain or cramps in your legs and feet.  Your legs or feet are numb.  Your feet always feel cold. SEEK IMMEDIATE MEDICAL CARE IF:   There is increasing redness, swelling, or pain in or around a wound.  There is a red line that goes up your leg.  Pus is coming from a wound.  You develop a fever or as directed by your health care provider.  You notice a bad smell coming from an ulcer or wound.   This information is not intended to replace advice given to you by your health care provider. Make sure you discuss any questions you have with your health care provider.   Document Released: 02/19/2000 Document Revised: 10/24/2012 Document Reviewed:  07/31/2012 Elsevier Interactive Patient Education 2016 [employer] Diabetes and Exercise Exercising regularly is important. It is not just about losing weight. It has many health benefits, such as:  Improving your overall fitness, flexibility, and endurance.  Increasing your bone density.  Helping with weight control.  Decreasing your body fat.  Increasing your muscle strength.  Reducing stress and tension.  Improving your overall health. People with diabetes who exercise gain additional benefits because exercise:  Reduces appetite.  Improves the body's use of blood sugar (glucose).    Helps lower or control blood glucose.  Decreases blood pressure.  Helps control blood lipids (such as cholesterol and triglycerides).  Improves the body's use of the hormone insulin by:  Increasing the body's insulin sensitivity.  Reducing the body's insulin needs.  Decreases the risk for heart disease because exercising:  Lowers cholesterol and triglycerides levels.  Increases the levels of good cholesterol (such as high-density lipoproteins [HDL]) in the body.  Lowers blood glucose levels. YOUR ACTIVITY PLAN  Choose an activity that you enjoy, and set realistic goals. To exercise safely, you should begin practicing any new physical activity slowly, and gradually increase the intensity of the exercise over time. Your health care provider or diabetes educator can help create an activity plan that works for you. General recommendations include:  Encouraging children to engage in at least 60 minutes of physical activity each day.  Stretching and performing strength training exercises, such as yoga or weight lifting, at least 2 times per week.  Performing a total of at least 150 minutes of moderate-intensity exercise each week, such as brisk walking or water aerobics.  Exercising at least 3 days per week, making sure you allow no more than 2 consecutive days to pass without  exercising.  Avoiding long periods of inactivity (90 minutes or more). When you have to spend an extended period of time sitting down, take frequent breaks to walk or stretch. RECOMMENDATIONS FOR EXERCISING WITH TYPE 1 OR TYPE 2 DIABETES   Check your blood glucose before exercising. If blood glucose levels are greater than 240 mg/dL, check for urine ketones. Do not exercise if ketones are present.  Avoid injecting insulin into areas of the body that are going to be exercised. For example, avoid injecting insulin into:  The arms when playing tennis.  The legs when jogging.  Keep a record of:  Food intake before and after you exercise.  Expected peak times of insulin action.  Blood glucose levels before and after you exercise.  The type and amount of exercise you have done.  Review your records with your health care provider. Your health care provider will help you to develop guidelines for adjusting food intake and insulin amounts before and after exercising.  If you take insulin or oral hypoglycemic agents, watch for signs and symptoms of hypoglycemia. They include:  Dizziness.  Shaking.  Sweating.  Chills.  Confusion.  Drink plenty of water while you exercise to prevent dehydration or heat stroke. Body water is lost during exercise and must be replaced.  Talk to your health care provider before starting an exercise program to make sure it is safe for you. Remember, almost any type of activity is better than none.   This information is not intended to replace advice given to you by your health care provider. Make sure you discuss any questions you have with your health care provider.   Document Released: 05/14/2003 Document Revised: 07/08/2014 Document Reviewed: 07/31/2012 Elsevier Interactive Patient Education 2016 [employer] Basic Carbohydrate Counting for Diabetes Mellitus Carbohydrate counting is a method for keeping track of the amount of carbohydrates you eat.  Eating carbohydrates naturally increases the level of sugar (glucose) in your blood, so it is important for you to know the amount that is okay for you to have in every meal. Carbohydrate counting helps keep the level of glucose in your blood within normal limits. The amount of carbohydrates allowed is different for every person. A dietitian can help you calculate the amount that is right for   you. Once you know the amount of carbohydrates you can have, you can count the carbohydrates in the foods you want to eat. Carbohydrates are found in the following foods:  Grains, such as breads and cereals.  Dried beans and soy products.  Starchy vegetables, such as potatoes, peas, and corn.  Fruit and fruit juices.  Milk and yogurt.  Sweets and snack foods, such as cake, cookies, candy, chips, soft drinks, and fruit drinks. CARBOHYDRATE COUNTING There are two ways to count the carbohydrates in your food. You can use either of the methods or a combination of both. Reading the "Nutrition Facts" on Packaged Food The "Nutrition Facts" is an area that is included on the labels of almost all packaged food and beverages in the [place]. It includes the serving size of that food or beverage and information about the nutrients in each serving of the food, including the grams (g) of carbohydrate per serving.  Decide the number of servings of this food or beverage that you will be able to eat or drink. Multiply that number of servings by the number of grams of carbohydrate that is listed on the label for that serving. The total will be the amount of carbohydrates you will be having when you eat or drink this food or beverage. Learning Standard Serving Sizes of Food When you eat food that is not packaged or does not include "Nutrition Facts" on the label, you need to measure the servings in order to count the amount of carbohydrates.A serving of most carbohydrate-rich foods contains about 15 g of carbohydrates.  The following list includes serving sizes of carbohydrate-rich foods that provide 15 g ofcarbohydrate per serving:   1 slice of bread (1 oz) or 1 six-inch tortilla.    of a hamburger bun or English muffin.  4-6 crackers.   cup unsweetened dry cereal.    cup hot cereal.   cup rice or pasta.    cup mashed potatoes or  of a large baked potato.  1 cup fresh fruit or one small piece of fruit.    cup canned or frozen fruit or fruit juice.  1 cup milk.   cup plain fat-free yogurt or yogurt sweetened with artificial sweeteners.   cup cooked dried beans or starchy vegetable, such as peas, corn, or potatoes.  Decide the number of standard-size servings that you will eat. Multiply that number of servings by 15 (the grams of carbohydrates in that serving). For example, if you eat 2 cups of strawberries, you will have eaten 2 servings and 30 g of carbohydrates (2 servings x 15 g = 30 g). For foods such as soups and casseroles, in which more than one food is mixed in, you will need to count the carbohydrates in each food that is included. EXAMPLE OF CARBOHYDRATE COUNTING Sample Dinner  3 oz chicken breast.   cup of brown rice.   cup of corn.  1 cup milk.   1 cup strawberries with sugar-free whipped topping.  Carbohydrate Calculation Step 1: Identify the foods that contain carbohydrates:   Rice.   Corn.   Milk.   Strawberries. Step 2:Calculate the number of servings eaten of each:   2 servings of rice.   1 serving of corn.   1 serving of milk.   1 serving of strawberries. Step 3: Multiply each of those number of servings by 15 g:   2 servings of rice x 15 g = 30 g.   1 serving of corn   x 15 g = 15 g.   1 serving of milk x 15 g = 15 g.   1 serving of strawberries x 15 g = 15 g. Step 4: Add together all of the amounts to find the total grams of carbohydrates eaten: 30 g + 15 g + 15 g + 15 g = 75 g.   This information is not intended to  replace advice given to you by your health care provider. Make sure you discuss any questions you have with your health care provider.   Document Released: 02/21/2005 Document Revised: 03/14/2014 Document Reviewed: 01/18/2013 Elsevier Interactive Patient Education 2016 [employer]  

## 2015-05-01 ENCOUNTER — Other Ambulatory Visit: Payer: Self-pay | Admitting: Pharmacist

## 2015-05-01 ENCOUNTER — Other Ambulatory Visit: Payer: Self-pay | Admitting: Internal Medicine

## 2015-05-01 LAB — MICROALBUMIN / CREATININE URINE RATIO
CREATININE, URINE: 350 mg/dL — AB (ref 20–320)
MICROALB UR: 7.5 mg/dL
MICROALB/CREAT RATIO: 21 ug/mg{creat} (ref <30)

## 2015-05-01 MED ORDER — GLUCOSE BLOOD VI STRP: ORAL_STRIP | Status: DC

## 2015-05-01 MED ORDER — TRUE METRIX METER W/DEVICE KIT: PACK | Status: DC

## 2015-05-01 MED ORDER — TRUEPLUS LANCETS 28G MISC: Status: DC

## 2015-05-14 ENCOUNTER — Ambulatory Visit: Payer: No Typology Code available for payment source | Admitting: Pharmacist

## 2015-05-21 ENCOUNTER — Telehealth: Payer: Self-pay | Admitting: Internal Medicine

## 2015-05-21 ENCOUNTER — Ambulatory Visit: Payer: No Typology Code available for payment source | Attending: Internal Medicine | Admitting: Pharmacist

## 2015-05-21 VITALS — BP 143/81 | HR 85

## 2015-05-21 DIAGNOSIS — Z79899 Other long term (current) drug therapy: Secondary | ICD-10-CM | POA: Insufficient documentation

## 2015-05-21 DIAGNOSIS — I10 Essential (primary) hypertension: Secondary | ICD-10-CM | POA: Diagnosis not present

## 2015-05-21 DIAGNOSIS — E1142 Type 2 diabetes mellitus with diabetic polyneuropathy: Secondary | ICD-10-CM

## 2015-05-21 DIAGNOSIS — Z794 Long term (current) use of insulin: Secondary | ICD-10-CM | POA: Diagnosis not present

## 2015-05-21 DIAGNOSIS — E1165 Type 2 diabetes mellitus with hyperglycemia: Secondary | ICD-10-CM | POA: Diagnosis not present

## 2015-05-21 MED ORDER — INSULIN GLARGINE 100 UNIT/ML SOLOSTAR PEN: 20.0000 [IU] | PEN_INJECTOR | Freq: Every evening | SUBCUTANEOUS | Status: DC

## 2015-05-21 NOTE — Progress Notes (Signed)
S:    Patient arrives in good mood after coming straight from her shift at work.  Presents for diabetes evaluation, education, and management.  Patient reports Diabetes was diagnosed in 2000   Patient reports adherence with medications.  Current diabetes medications include: Metformin 1000 mg BID; Novolin 70/30 30 units BID; Novolog SSI.  Current hypertension medications include: Lisinopril-HCTZ  20-12.5  Patient reports hypoglycemic events. This past week the pt experienced a hypoglycemic event while sleeping. Upon waking the patient could only see white spots and was in a cold sweat and shaky. She measured her BG and it was 20. Pt used orange juice with sugar to remedy the event.  Patient states her daughter has recently gotten engaged and has begun to help her plan the wedding. Pt continues to say that she will attempt to avoid as much stress from this scenario as possible.  Patient reported dietary habits: Pt has been eating much better recently since family friend was diagnosed with diabetes as well. Pt works third shift at a plant and tries to fit in meals throughout the day as she can. She keeps snacks with her while she works to keep her BG up.  Patient reported exercise habits: a combined 30 minute walk to and from work each day.  Patient reports nocturia.  Patient reports neuropathy. Patient reports visual changes. Patient denies self foot exams.    O:  Lab Results  Component Value Date   HGBA1C 11.4 04/30/2015   Filed Vitals:   05/21/15 0917  BP: 148/84  Pulse: 73    Home fasting CBG: 130-150 2 hour post-prandial/random CBG: 190-250.   A/P: Diabetes longstanding currently uncontrolled. Patient reports hypoglycemic events and is able to verbalize appropriate hypoglycemia management plan. Patient reports adherence with medication. Control is suboptimal due to improper insulin dosing and hypoglycemic events.  Started basal insulin Lantus (insulin glargine) 20 units  when patient wakes up. Continue Novolog sliding scale as prescribed prior to today's visit.  If patient cannot afford Lantus she is to continue to take Novolin 70/30 and decrease the dose to 20 units twice a day. Continue Novolog sliding scale as prescribed.  Next A1C anticipated 07/28/15.    Hypertension longstanding/newly diagnosed currently uncontrolled.  Patient reports adherence with medication. Control is suboptimal due to medication not titrated to maximum effective dose. However, will focus on diabetes at this visit and then adjust medications for blood pressure as needed at next visit to not overwhelm patient.  Written patient instructions provided.  Total time in face to face counseling 30 minutes.   Follow up in Pharmacist Clinic Visit in 1-2 weeks with me.   Patient seen with Theresa MulliganMartin Shaughnessy, PharmD Candidate

## 2015-05-21 NOTE — Patient Instructions (Signed)
Thank you for coming in to see us today!  Start Lantus insulin 20 units at night (when you wake up). Continue to take your Novolog insulin based on the previous sliding scale you've been using. STOP taking the Novolin 70/30 insulin.  IF YOU CANNOT GET THE LANTUS: Continue to take Novolin 70/30 insulin BUT use 20 units twice a day. Continue to take your Novolog insulin based on the previous sliding scale you've been using.   Please record blood sugar first thing when you wake up and then once again before you go to bed. Please bring the record of these blood sugar readings with you for your next visit.  Please follow up with us in 1-2 weeks to review blood sugars with the insulin changes made.   Good work on the dietary changes, we are heading in the right direction, keep it up!

## 2015-05-21 NOTE — Telephone Encounter (Signed)
Patient Dropped off FMLA Paperwork to be completed by doctor.

## 2015-05-22 MED FILL — !LANTUS SOLOSTAR 100UNITS/M: 100 | 30 days supply | Qty: 6 | Fill #0

## 2015-05-29 MED FILL — TRUEplus LANCETS 28G MISC: 30 days supply | Qty: 100 | Fill #0

## 2015-05-29 MED FILL — TRUE METRIX BLOOD GLUCOSE M: W/DEVICE | 1 days supply | Qty: 1 | Fill #0

## 2015-05-29 MED FILL — TRUE METRIX TEST STRIP: 15 days supply | Qty: 50 | Fill #0

## 2015-06-04 ENCOUNTER — Encounter: Payer: No Typology Code available for payment source | Admitting: Pharmacist

## 2015-06-16 ENCOUNTER — Encounter: Payer: No Typology Code available for payment source | Admitting: Pharmacist

## 2015-07-10 ENCOUNTER — Encounter (HOSPITAL_BASED_OUTPATIENT_CLINIC_OR_DEPARTMENT_OTHER): Payer: No Typology Code available for payment source

## 2015-07-12 IMAGING — US US PELVIS COMPLETE
1 series · 14 of 25 positions shown · non-contrast
Comparison: 07/22/2008

CLINICAL DATA: Bulky or enlarged uterus. Previous bilateral tubal
ligation and 2 previous C-sections. Gravida 6 para 3. LMP
03/31/2014.



[Series 1: us pelvis complete · 0.24mm/px · 14 of 81 slices shown]
[im 1/81]
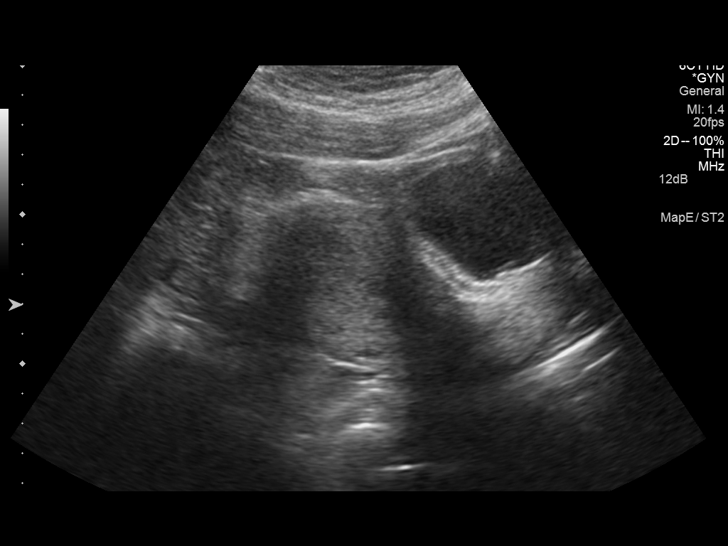
[im 7/81]
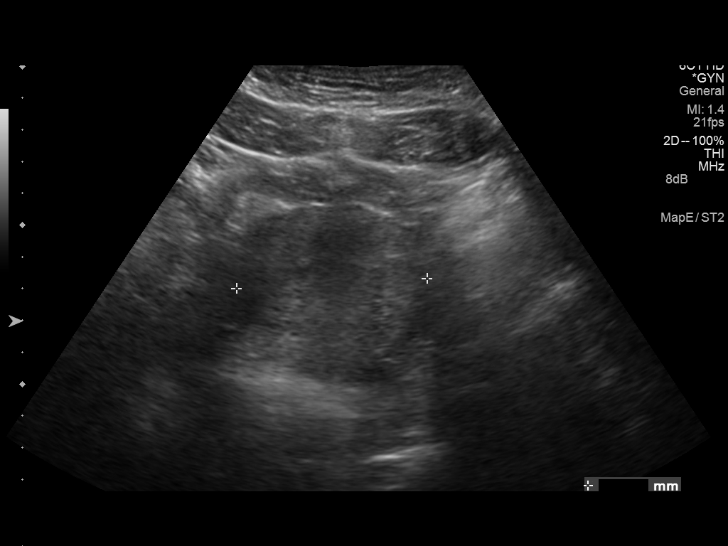
[im 14/81]
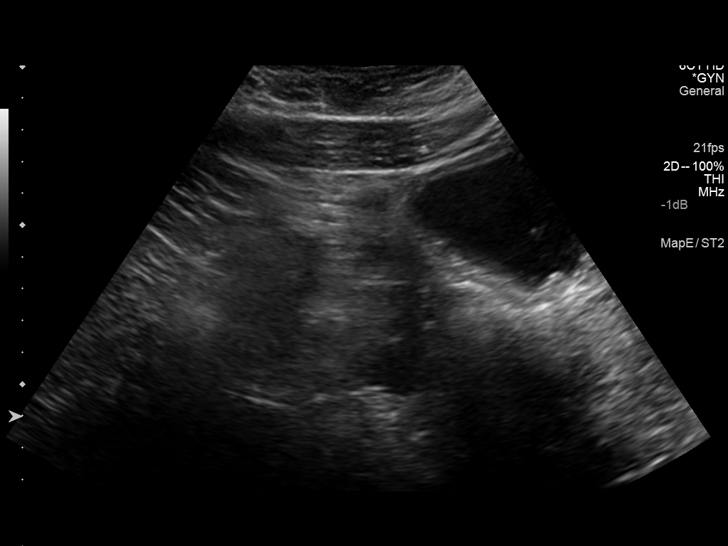
[im 21/81]
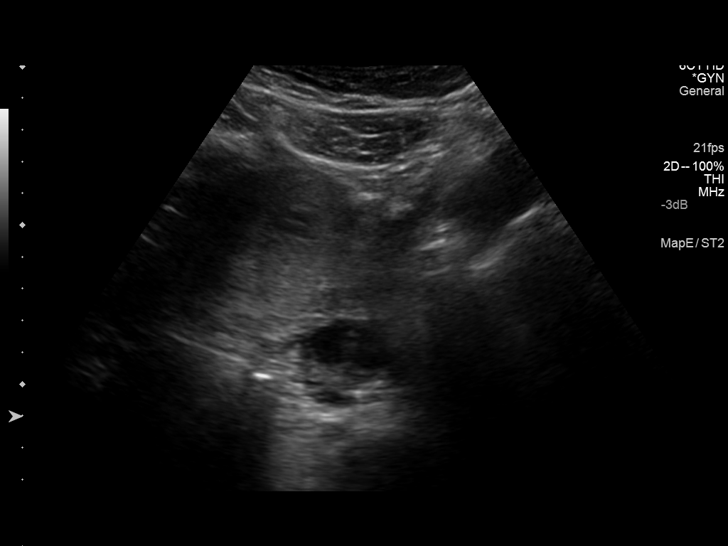
[im 27/81]
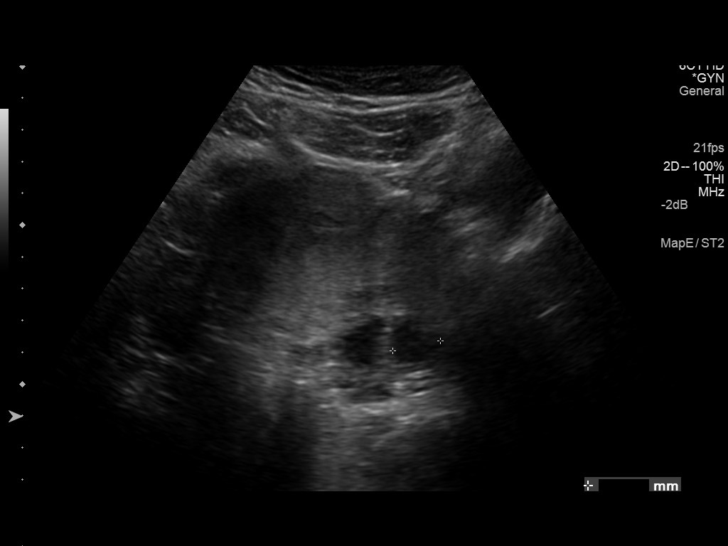
[im 31/81]
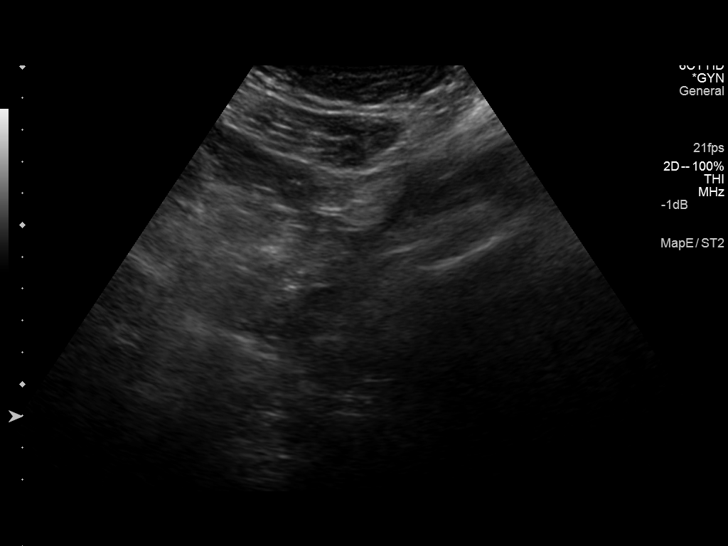
[im 37/81]
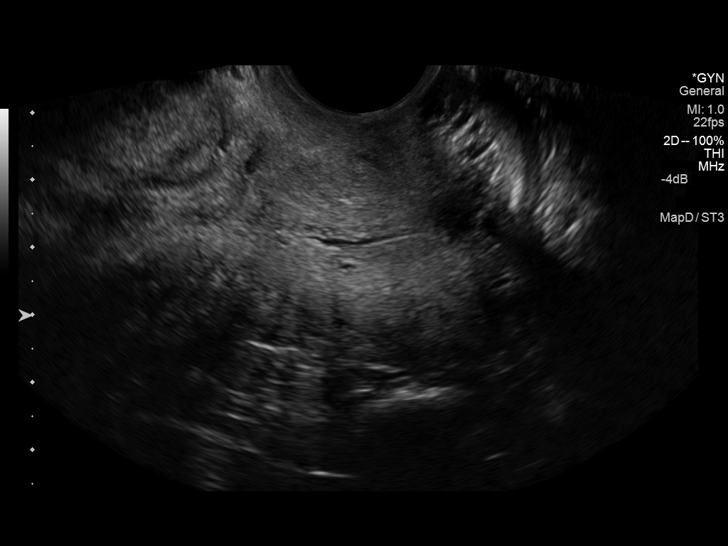
[im 44/81]
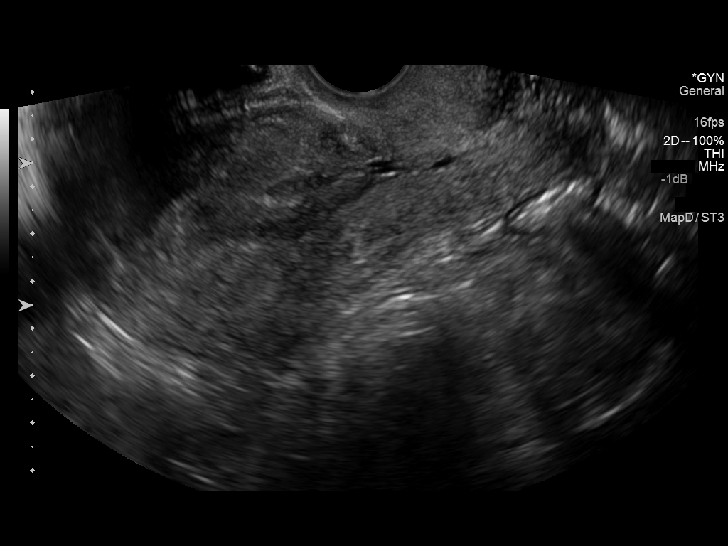
[im 51/81]
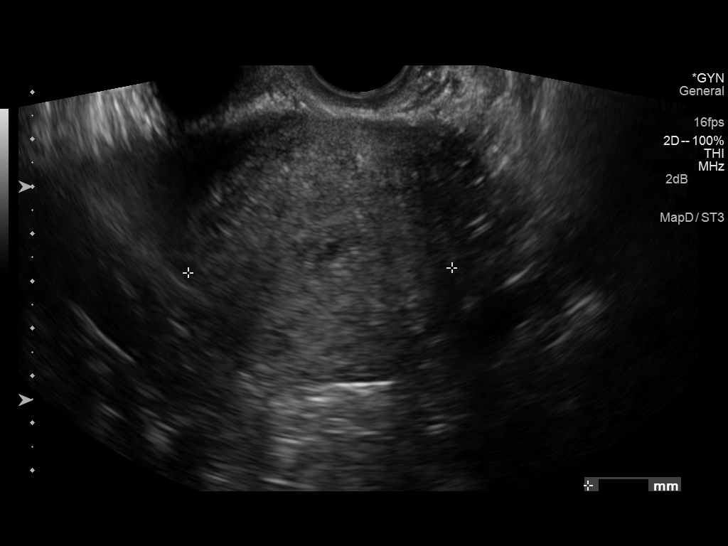
[im 54/81]
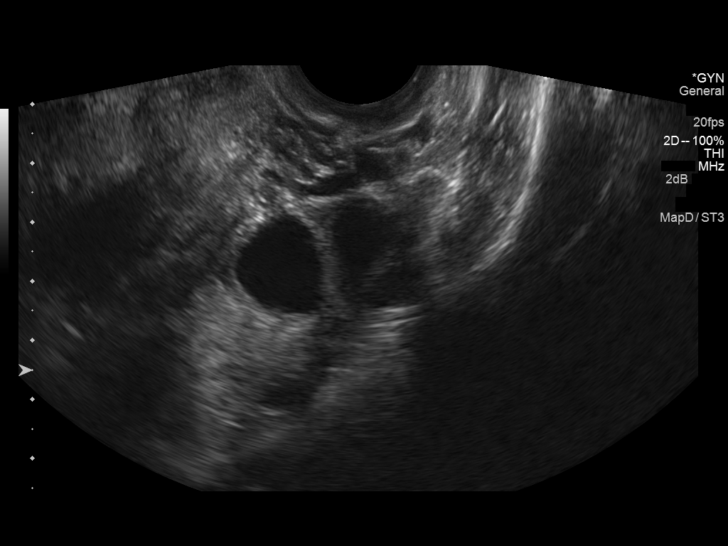
[im 61/81]
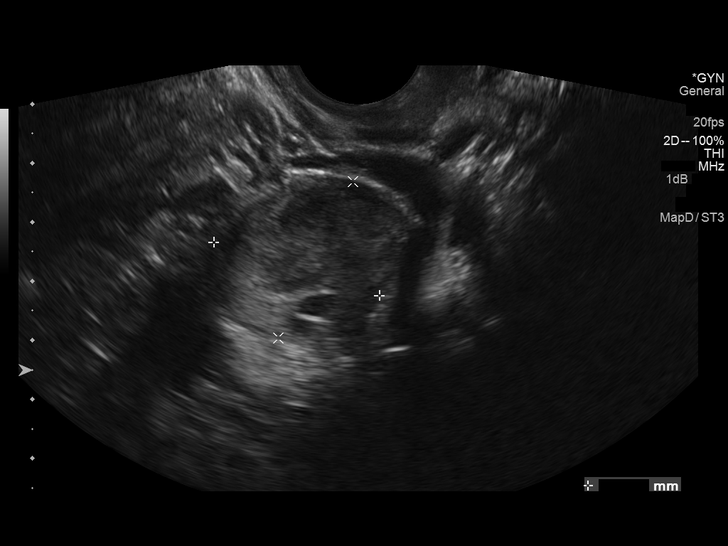
[im 67/81]
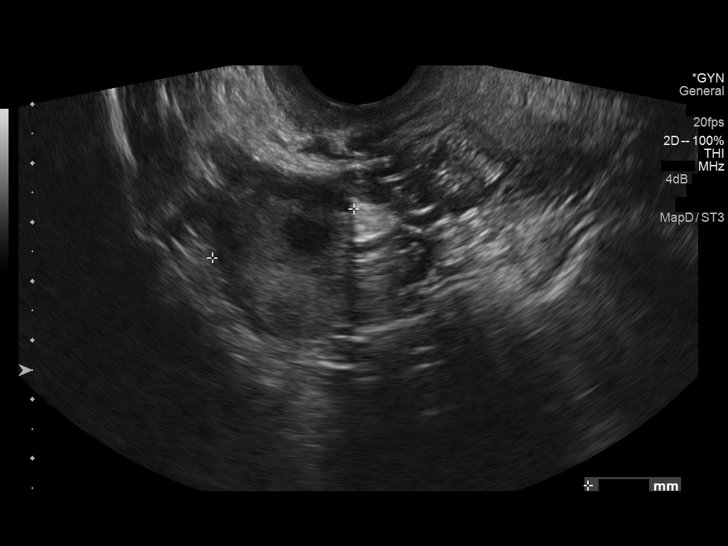
[im 74/81]
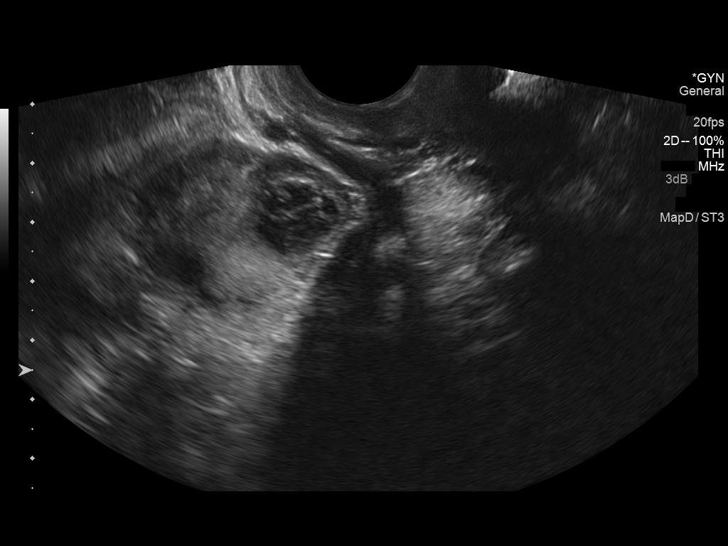
[im 81/81]
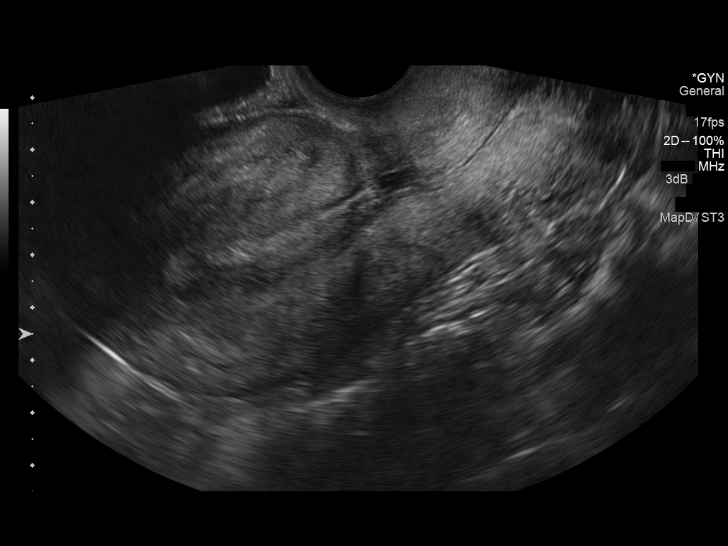

[14 of 25 positions shown; findings below may reference images not displayed]

FINDINGS: Uterus

Measurements: 10.7 x 5.3 x 5.6 cm. No fibroids or other mass
visualized. Visible C-section scar.

Endometrium

Thickness: 15.2 mm.  No focal abnormality visualized.

Right ovary

Measurements: 3.8 x 3.0 x 2.5 cm. Small hypoechoic lesion with few
reticular echoes consistent with a hemorrhagic cyst.

Left ovary

Measurements: 3.0 x 2.9 x 3.2 cm. Small follicles are 2.1 and
cm.

Other findings

No free fluid.
IMPRESSION: 1. Normal appearance of the uterus.
2. Small right hemorrhagic cyst.
3. No suspicious adnexal mass.

## 2015-07-20 ENCOUNTER — Telehealth: Payer: Self-pay | Admitting: Internal Medicine

## 2015-07-20 NOTE — Telephone Encounter (Signed)
Patient dropped off paperwork to be completed by doctor. FMLA Paperwork

## 2015-08-07 NOTE — Telephone Encounter (Signed)
Patient verified DOB Patient states she is requesting FMLA due to her diabetes. Patient is aware of message being routed to the provider and paperwork will be filled out based on his medical opinion. Patient will receive a phone call advising her of paperwork being completed or placed at the front desk for return.

## 2015-08-12 ENCOUNTER — Telehealth: Payer: Self-pay | Admitting: *Deleted

## 2015-08-12 NOTE — Telephone Encounter (Signed)
Medical Assistant left message on patient's home and cell voicemail. Voicemail states to give a call back [facility] [place] [phone].  !!!Please inform patient of needing an appointment to have FMLA paperwork completed. PLEASE SCHEDULE APPT!!!

## 2015-08-24 ENCOUNTER — Telehealth: Payer: Self-pay | Admitting: Internal Medicine

## 2015-08-24 NOTE — Telephone Encounter (Signed)
Patient called returning nurse's call about FMLA paperwork, patient is aware she need to schedule an appointment to have paperwork completed. Please f/up

## 2015-08-28 ENCOUNTER — Encounter (HOSPITAL_COMMUNITY): Payer: Self-pay | Admitting: Emergency Medicine

## 2015-08-28 DIAGNOSIS — I10 Essential (primary) hypertension: Secondary | ICD-10-CM | POA: Insufficient documentation

## 2015-08-28 DIAGNOSIS — Z87891 Personal history of nicotine dependence: Secondary | ICD-10-CM | POA: Insufficient documentation

## 2015-08-28 DIAGNOSIS — Z794 Long term (current) use of insulin: Secondary | ICD-10-CM | POA: Insufficient documentation

## 2015-08-28 DIAGNOSIS — Z79899 Other long term (current) drug therapy: Secondary | ICD-10-CM | POA: Insufficient documentation

## 2015-08-28 DIAGNOSIS — Z7982 Long term (current) use of aspirin: Secondary | ICD-10-CM | POA: Insufficient documentation

## 2015-08-28 DIAGNOSIS — G40909 Epilepsy, unspecified, not intractable, without status epilepticus: Secondary | ICD-10-CM | POA: Insufficient documentation

## 2015-08-28 DIAGNOSIS — Z7984 Long term (current) use of oral hypoglycemic drugs: Secondary | ICD-10-CM | POA: Insufficient documentation

## 2015-08-28 DIAGNOSIS — E11649 Type 2 diabetes mellitus with hypoglycemia without coma: Secondary | ICD-10-CM | POA: Insufficient documentation

## 2015-08-28 DIAGNOSIS — R040 Epistaxis: Secondary | ICD-10-CM | POA: Insufficient documentation

## 2015-08-28 LAB — CBG MONITORING, ED: GLUCOSE-CAPILLARY: 280 mg/dL — AB (ref 65–99)

## 2015-08-28 NOTE — ED Notes (Signed)
Pt sts she noticed her HA this morning and that she was having a nosebleed. Pt sts that her boyfriend woke her up this afternoon because she was having a seizure. Pt reports hx of seizures "years ago." Pt a/o x 4. Pt also reports her CBG was 40 today.

## 2015-08-29 ENCOUNTER — Emergency Department (HOSPITAL_COMMUNITY)
Admission: EM | Admit: 2015-08-29 | Discharge: 2015-08-29 | Disposition: A | Discharge status: Home / Self Care | Payer: MEDICAID | Attending: Emergency Medicine | Admitting: Emergency Medicine

## 2015-08-29 DIAGNOSIS — R569 Unspecified convulsions: Secondary | ICD-10-CM

## 2015-08-29 DIAGNOSIS — E162 Hypoglycemia, unspecified: Secondary | ICD-10-CM

## 2015-08-29 LAB — URINALYSIS, ROUTINE W REFLEX MICROSCOPIC
BILIRUBIN URINE: NEGATIVE
Glucose, UA: NEGATIVE mg/dL
Ketones, ur: 15 mg/dL — AB
Leukocytes, UA: NEGATIVE
NITRITE: NEGATIVE
PROTEIN: NEGATIVE mg/dL
SPECIFIC GRAVITY, URINE: 1.023 (ref 1.005–1.030)
pH: 6 (ref 5.0–8.0)

## 2015-08-29 LAB — URINE MICROSCOPIC-ADD ON

## 2015-08-29 LAB — CBG MONITORING, ED
GLUCOSE-CAPILLARY: 207 mg/dL — AB (ref 65–99)
Glucose-Capillary: 183 mg/dL — ABNORMAL HIGH (ref 65–99)

## 2015-08-29 LAB — I-STAT CHEM 8, ED
BUN: 6 mg/dL (ref 6–20)
CALCIUM ION: 1.18 mmol/L (ref 1.12–1.23)
CHLORIDE: 105 mmol/L (ref 101–111)
Creatinine, Ser: 0.7 mg/dL (ref 0.44–1.00)
Glucose, Bld: 182 mg/dL — ABNORMAL HIGH (ref 65–99)
HEMATOCRIT: 35 % — AB (ref 36.0–46.0)
HEMOGLOBIN: 11.9 g/dL — AB (ref 12.0–15.0)
Potassium: 3.7 mmol/L (ref 3.5–5.1)
SODIUM: 139 mmol/L (ref 135–145)
TCO2: 23 mmol/L (ref 0–100)

## 2015-08-29 NOTE — ED Notes (Addendum)
CBG was 183, EDP notified.

## 2015-08-29 NOTE — ED Provider Notes (Signed)
CSN: [id number]     Arrival date & time 08/28/15  2123 History   First MD Initiated Contact with Patient 08/29/15 [phone]     Chief Complaint  Patient presents with  . Headache  . Seizures  . Epistaxis     (Consider location/radiation/quality/duration/timing/severity/associated sxs/prior Treatment) HPI Comments: Patient with a history of insulin-dependent DM, HTN, Migraine HA, presents with bilateral, frontal headache that started earlier yesterday, associated with allergy symptoms typical for patient of runny nose, sneezing. She reports brief nosebleed x 1. The patient works 3rd shift and so was sleeping yesterday afternoon with her husband who reports he woke to find her having a seizure. The seizure last less than one minute and there was a post-ictal period before returning to baseline. He checked her blood sugar and reports it was 40. She states she had a recent medication change in her insulin one month ago and her blood sugar is usually normal but has infrequent highs and lows. She reports history of one seizure in the past thought due "to stress".   Patient is a 40 y.o. female presenting with headaches, seizures, and nosebleeds. The history is provided by the patient and the spouse. No language interpreter was used.  Headache Pain location:  Frontal Associated symptoms: cough and seizures   Associated symptoms: no fever, no myalgias and no vomiting   Seizures Epistaxis Associated symptoms: cough, headaches and sneezing   Associated symptoms: no fever     Past Medical History  Diagnosis Date  . Hypertension   . Diabetes mellitus without complication ([place])   . Migraine   . Seizures ([place])     self reported   Past Surgical History  Procedure Laterality Date  . Cholecystectomy    . Tonsillectomy    . Tubal ligation    . Cyst excision    . Shoulder open rotator cuff repair Left    Family History  Problem Relation Age of Onset  . Hypertension Mother   . Thyroid disease  Mother   . Bipolar disorder Sister   . Irregular heart beat Mother    Social History  Substance Use Topics  . Smoking status: Former Research scientist (life sciences)  . Smokeless tobacco: None     Comment: Smoked for 4 years, quit age 6  . Alcohol Use: 0.0 oz/week    0 Standard drinks or equivalent per week     Comment: Holidays only   OB History    Gravida Para Term Preterm AB TAB SAB Ectopic Multiple Living   '6 3 3  3  2   3     '$ Review of Systems  Constitutional: Negative for fever and chills.  HENT: Positive for nosebleeds, rhinorrhea and sneezing.   Respiratory: Positive for cough. Negative for shortness of breath.   Cardiovascular: Negative.   Gastrointestinal: Negative.  Negative for vomiting.  Genitourinary: Positive for dysuria and frequency.  Musculoskeletal: Negative.  Negative for myalgias.  Skin: Negative.   Neurological: Positive for seizures and headaches.      Allergies  Tomato  Home Medications   Prior to Admission medications   Medication Sig Start Date End Date Taking? Authorizing Provider  aspirin EC 81 MG tablet Take 1 tablet (81 mg total) by mouth daily. 01/22/13  Yes Nishant Dhungel, MD  Blood Glucose Monitoring Suppl (TRUE METRIX METER) w/Device KIT USE AS DIRECTED 05/01/15  Yes Olugbemiga E Doreene Burke, MD  ferrous sulfate 325 (65 FE) MG tablet Take 1 tablet (325 mg total) by mouth 2 (two) times daily  with a meal. 04/30/15  Yes Olugbemiga E Hyman Hopes, MD  gabapentin (NEURONTIN) 300 MG capsule Take 1 capsule (300 mg total) by mouth 3 (three) times daily. 04/30/15  Yes Quentin Angst, MD  glucose blood (TRUE METRIX BLOOD GLUCOSE TEST) test strip Use as instructed 05/01/15  Yes Olugbemiga E Hyman Hopes, MD  insulin aspart (NOVOLOG) 100 UNIT/ML injection Inject 3-10 Units into the skin 3 (three) times daily as needed for high blood sugar. 150 - 200 = 3 units, 201 - 250 = 5 units, 251 - 300 = 7 units 301 - 350 = 10 units, > 350, call or come to clinic 04/30/15  Yes Olugbemiga E Hyman Hopes, MD   insulin regular (NOVOLIN R,HUMULIN R) 100 units/mL injection Inject 30 Units into the skin at bedtime.   Yes Historical Provider, MD  lisinopril-hydrochlorothiazide (PRINZIDE,ZESTORETIC) 20-12.5 MG tablet Take 1 tablet by mouth daily. 04/30/15  Yes Quentin Angst, MD  metFORMIN (GLUCOPHAGE) 1000 MG tablet Take 1 tablet (1,000 mg total) by mouth 2 (two) times daily with a meal. 04/30/15  Yes Quentin Angst, MD  simvastatin (ZOCOR) 10 MG tablet Take 1 tablet (10 mg total) by mouth at bedtime. 04/30/15  Yes Quentin Angst, MD  TRUEPLUS LANCETS 28G MISC USE AS INSTRUCTED 05/01/15  Yes Olugbemiga E Hyman Hopes, MD   BP 131/71 mmHg  Pulse 56  Temp(Src) 98.9 F (37.2 C) (Oral)  Resp 16  Ht 5\' 4"  (1.626 m)  Wt 95 kg  BMI 35.93 kg/m2  SpO2 100%  LMP 08/28/2015 Physical Exam  Constitutional: She is oriented to person, place, and time. She appears well-developed and well-nourished.  HENT:  Head: Normocephalic.  No active epistaxis.  Eyes: Pupils are equal, round, and reactive to light.  Neck: Normal range of motion. Neck supple.  Cardiovascular: Normal rate and regular rhythm.   Pulmonary/Chest: Effort normal and breath sounds normal. She has no wheezes. She has no rales.  Abdominal: Soft. Bowel sounds are normal. There is no tenderness. There is no rebound and no guarding.  Musculoskeletal: Normal range of motion.  Neurological: She is alert and oriented to person, place, and time. She has normal strength and normal reflexes. No sensory deficit. She displays a negative Romberg sign. Coordination normal.  CN's 3-12 grossly intact. No deficits of coordination. Speech clear and focused. Ambulatory without ataxia.   Skin: Skin is warm and dry. No rash noted.  Psychiatric: She has a normal mood and affect.    ED Course  Procedures (including critical care time) Labs Review Labs Reviewed  CBG MONITORING, ED - Abnormal; Notable for the following:    Glucose-Capillary 280 (*)    All  other components within normal limits  CBG MONITORING, ED - Abnormal; Notable for the following:    Glucose-Capillary 207 (*)    All other components within normal limits  URINALYSIS, ROUTINE W REFLEX MICROSCOPIC (NOT AT [facility])  I-STAT CHEM 8, ED   Results for orders placed or performed during the hospital encounter of 08/29/15  Urinalysis, Routine w reflex microscopic  Result Value Ref Range   Color, Urine YELLOW YELLOW   APPearance CLEAR CLEAR   Specific Gravity, Urine 1.023 1.005 - 1.030   pH 6.0 5.0 - 8.0   Glucose, UA NEGATIVE NEGATIVE mg/dL   Hgb urine dipstick LARGE (A) NEGATIVE   Bilirubin Urine NEGATIVE NEGATIVE   Ketones, ur 15 (A) NEGATIVE mg/dL   Protein, ur NEGATIVE NEGATIVE mg/dL   Nitrite NEGATIVE NEGATIVE   Leukocytes, UA NEGATIVE NEGATIVE  Urine microscopic-add on  Result Value Ref Range   Squamous Epithelial / LPF 0-5 (A) NONE SEEN   WBC, UA 0-5 0 - 5 WBC/hpf   RBC / HPF TOO NUMEROUS TO COUNT 0 - 5 RBC/hpf   Bacteria, UA RARE (A) NONE SEEN   Urine-Other MUCOUS PRESENT   CBG monitoring, ED  Result Value Ref Range   Glucose-Capillary 280 (H) 65 - 99 mg/dL  CBG monitoring, ED  Result Value Ref Range   Glucose-Capillary 207 (H) 65 - 99 mg/dL  I-stat Chem 8, ED  Result Value Ref Range   Sodium 139 135 - 145 mmol/L   Potassium 3.7 3.5 - 5.1 mmol/L   Chloride 105 101 - 111 mmol/L   BUN 6 6 - 20 mg/dL   Creatinine, Ser 0.70 0.44 - 1.00 mg/dL   Glucose, Bld 182 (H) 65 - 99 mg/dL   Calcium, Ion 1.18 1.12 - 1.23 mmol/L   TCO2 23 0 - 100 mmol/L   Hemoglobin 11.9 (L) 12.0 - 15.0 g/dL   HCT 35.0 (L) 36.0 - 46.0 %    Imaging Review No results found. I have personally reviewed and evaluated these images and lab results as part of my medical decision-making.   EKG Interpretation None      MDM   Final diagnoses:  None    1. Hypoglycemia 2. Seizure  Patient presents with seizure x 1 in the setting of hypoglycemia yesterday afternoon. Boyfriend, who  found her seizing and checked her CBG, gave her juice to elevate her sugar and there has been no further seizure activity. CBG stable here. Labs reassuring. No evidence of infection. She is felt stable for discharge home with close PCP follow up.    Charlann Lange, PA-C 08/29/15 [place], DO 08/29/15 [phone]

## 2015-08-29 NOTE — Discharge Instructions (Signed)
BE SURE TO CHECK YOUR BLOOD SUGAR 4 TIMES DAILY FOR THE NEXT SEVERAL DAYS. FOLLOW UP WITH YOUR DOCTOR FOR RECHECK THIS WEEK. RETURN HERE AS NEEDED FOR WORSENING SYMPTOMS.   Blood Glucose Monitoring, Adult Monitoring your blood glucose (also know as blood sugar) helps you to manage your diabetes. It also helps you and your health care provider monitor your diabetes and determine how well your treatment plan is working. WHY SHOULD YOU MONITOR YOUR BLOOD GLUCOSE?  It can help you understand how food, exercise, and medicine affect your blood glucose.  It allows you to know what your blood glucose is at any given moment. You can quickly tell if you are having low blood glucose (hypoglycemia) or high blood glucose (hyperglycemia).  It can help you and your health care provider know how to adjust your medicines.  It can help you understand how to manage an illness or adjust medicine for exercise. WHEN SHOULD YOU TEST? Your health care provider will help you decide how often you should check your blood glucose. This may depend on the type of diabetes you have, your diabetes control, or the types of medicines you are taking. Be sure to write down all of your blood glucose readings so that this information can be reviewed with your health care provider. See below for examples of testing times that your health care provider may suggest. Type 1 Diabetes  Test at least 2 times per day if your diabetes is well controlled, if you are using an insulin pump, or if you perform multiple daily injections.  If your diabetes is not well controlled or if you are sick, you may need to test more often.  It is a good idea to also test:  Before every insulin injection.  Before and after exercise.  Between meals and 2 hours after a meal.  Occasionally between 2:00 a.m. and 3:00 a.m. Type 2 Diabetes  If you are taking insulin, test at least 2 times per day. However, it is best to test before every insulin  injection.  If you take medicines by mouth (orally), test 2 times a day.  If you are on a controlled diet, test once a day.  If your diabetes is not well controlled or if you are sick, you may need to monitor more often. HOW TO MONITOR YOUR BLOOD GLUCOSE Supplies Needed  Blood glucose meter.  Test strips for your meter. Each meter has its own strips. You must use the strips that go with your own meter.  A pricking needle (lancet).  A device that holds the lancet (lancing device).  A journal or log book to write down your results. Procedure  Wash your hands with soap and water. Alcohol is not preferred.  Prick the side of your finger (not the tip) with the lancet.  Gently milk the finger until a small drop of blood appears.  Follow the instructions that come with your meter for inserting the test strip, applying blood to the strip, and using your blood glucose meter. Other Areas to Get Blood for Testing Some meters allow you to use other areas of your body (other than your finger) to test your blood. These areas are called alternative sites. The most common alternative sites are:  The forearm.  The thigh.  The back area of the lower leg.  The palm of the hand. The blood flow in these areas is slower. Therefore, the blood glucose values you get may be delayed, and the numbers are different  from what you would get from your fingers. Do not use alternative sites if you think you are having hypoglycemia. Your reading will not be accurate. Always use a finger if you are having hypoglycemia. Also, if you cannot feel your lows (hypoglycemia unawareness), always use your fingers for your blood glucose checks. ADDITIONAL TIPS FOR GLUCOSE MONITORING  Do not reuse lancets.  Always carry your supplies with you.  All blood glucose meters have a 24-hour "hotline" number to call if you have questions or need help.  Adjust (calibrate) your blood glucose meter with a control solution  after finishing a few boxes of strips. BLOOD GLUCOSE RECORD KEEPING It is a good idea to keep a daily record or log of your blood glucose readings. Most glucose meters, if not all, keep your glucose records stored in the meter. Some meters come with the ability to download your records to your home computer. Keeping a record of your blood glucose readings is especially helpful if you are wanting to look for patterns. Make notes to go along with the blood glucose readings because you might forget what happened at that exact time. Keeping good records helps you and your health care provider to work together to achieve good diabetes management.    This information is not intended to replace advice given to you by your health care provider. Make sure you discuss any questions you have with your health care provider.   Document Released: 02/24/2003 Document Revised: 03/14/2014 Document Reviewed: 07/16/2012 Elsevier Interactive Patient Education 2016 Elsevier Inc. Hypoglycemia Hypoglycemia occurs when the glucose in your blood is too low. Glucose is a type of sugar that is your body's main energy source. Hormones, such as insulin and glucagon, control the level of glucose in the blood. Insulin lowers blood glucose and glucagon increases blood glucose. Having too much insulin in your blood stream, or not eating enough food containing sugar, can result in hypoglycemia. Hypoglycemia can happen to people with or without diabetes. It can develop quickly and can be a medical emergency.  CAUSES   Missing or delaying meals.  Not eating enough carbohydrates at meals.  Taking too much diabetes medicine.  Not timing your oral diabetes medicine or insulin doses with meals, snacks, and exercise.  Nausea and vomiting.  Certain medicines.  Severe illnesses, such as hepatitis, kidney disorders, and certain eating disorders.  Increased activity or exercise without eating something extra or adjusting  medicines.  Drinking too much alcohol.  A nerve disorder that affects body functions like your heart rate, blood pressure, and digestion (autonomic neuropathy).  A condition where the stomach muscles do not function properly (gastroparesis). Therefore, medicines and food may not absorb properly.  Rarely, a tumor of the pancreas can produce too much insulin. SYMPTOMS   Hunger.  Sweating (diaphoresis).  Change in body temperature.  Shakiness.  Headache.  Anxiety.  Lightheadedness.  Irritability.  Difficulty concentrating.  Dry mouth.  Tingling or numbness in the hands or feet.  Restless sleep or sleep disturbances.  Altered speech and coordination.  Change in mental status.  Seizures or prolonged convulsions.  Combativeness.  Drowsiness (lethargic).  Weakness.  Increased heart rate or palpitations.  Confusion.  Pale, gray skin color.  Blurred or double vision.  Fainting. DIAGNOSIS  A physical exam and medical history will be performed. Your caregiver may make a diagnosis based on your symptoms. Blood tests and other lab tests may be performed to confirm a diagnosis. Once the diagnosis is made, your caregiver will  see if your signs and symptoms go away once your blood glucose is raised.  TREATMENT  Usually, you can easily treat your hypoglycemia when you notice symptoms.  Check your blood glucose. If it is less than 70 mg/dl, take one of the following:   3-4 glucose tablets.    cup juice.    cup regular soda.   1 cup skim milk.   -1 tube of glucose gel.   5-6 hard candies.   Avoid high-fat drinks or food that may delay a rise in blood glucose levels.  Do not take more than the recommended amount of sugary foods, drinks, gel, or tablets. Doing so will cause your blood glucose to go too high.   Wait 10-15 minutes and recheck your blood glucose. If it is still less than 70 mg/dl or below your target range, repeat treatment.   Eat  a snack if it is more than 1 hour until your next meal.  There may be a time when your blood glucose may go so low that you are unable to treat yourself at home when you start to notice symptoms. You may need someone to help you. You may even faint or be unable to swallow. If you cannot treat yourself, someone will need to bring you to the hospital.  HOME CARE INSTRUCTIONS  If you have diabetes, follow your diabetes management plan by:  Taking your medicines as directed.  Following your exercise plan.  Following your meal plan. Do not skip meals. Eat on time.  Testing your blood glucose regularly. Check your blood glucose before and after exercise. If you exercise longer or different than usual, be sure to check blood glucose more frequently.  Wearing your medical alert jewelry that says you have diabetes.  Identify the cause of your hypoglycemia. Then, develop ways to prevent the recurrence of hypoglycemia.  Do not take a hot bath or shower right after an insulin shot.  Always carry treatment with you. Glucose tablets are the easiest to carry.  If you are going to drink alcohol, drink it only with meals.  Tell friends or family members ways to keep you safe during a seizure. This may include removing hard or sharp objects from the area or turning you on your side.  Maintain a healthy weight. SEEK MEDICAL CARE IF:   You are having problems keeping your blood glucose in your target range.  You are having frequent episodes of hypoglycemia.  You feel you might be having side effects from your medicines.  You are not sure why your blood glucose is dropping so low.  You notice a change in vision or a new problem with your vision. SEEK IMMEDIATE MEDICAL CARE IF:   Confusion develops.  A change in mental status occurs.  The inability to swallow develops.  Fainting occurs.   This information is not intended to replace advice given to you by your health care provider. Make  sure you discuss any questions you have with your health care provider.   Document Released: 02/21/2005 Document Revised: 02/26/2013 Document Reviewed: 10/28/2014 Elsevier Interactive Patient Education [employer] Elsevier Inc.

## 2015-09-01 ENCOUNTER — Ambulatory Visit (HOSPITAL_BASED_OUTPATIENT_CLINIC_OR_DEPARTMENT_OTHER): Payer: Self-pay | Attending: Internal Medicine | Admitting: Internal Medicine

## 2015-09-01 VITALS — Ht 64.0 in | Wt 208.0 lb

## 2015-09-01 DIAGNOSIS — R0683 Snoring: Secondary | ICD-10-CM | POA: Insufficient documentation

## 2015-09-01 DIAGNOSIS — G4733 Obstructive sleep apnea (adult) (pediatric): Secondary | ICD-10-CM | POA: Insufficient documentation

## 2015-09-01 DIAGNOSIS — Z7984 Long term (current) use of oral hypoglycemic drugs: Secondary | ICD-10-CM | POA: Insufficient documentation

## 2015-09-01 DIAGNOSIS — Z79899 Other long term (current) drug therapy: Secondary | ICD-10-CM | POA: Insufficient documentation

## 2015-09-06 NOTE — Procedures (Signed)
    Patient Name: Mariah Miller, [name] Date: 09/01/2015 Gender: Female D.O.B: Dec 08, 1975 Age (years): 40 Referring Provider: Jeanann Lewandowskylugbemiga Jegede Height (inches): 64 Interpreting Physician: Jetty Duhamellinton Young MD, ABSM Weight (lbs): 208 RPSGT: Wylie HailDavis, Rico BMI: 36 MRN: [id number] Neck Size: 15.00 CLINICAL INFORMATION Sleep Study Type: NPSG Indication for sleep study: OSA Epworth Sleepiness Score: 11  SLEEP STUDY TECHNIQUE As per the AASM Manual for the Scoring of Sleep and Associated Events v2.3 (April 2016) with a hypopnea requiring 4% desaturations. The channels recorded and monitored were frontal, central and occipital EEG, electrooculogram (EOG), submentalis EMG (chin), nasal and oral airflow, thoracic and abdominal wall motion, anterior tibialis EMG, snore microphone, electrocardiogram, and pulse oximetry.  MEDICATIONS Patient's medications include: charted for review. Medications self-administered by patient during sleep study : ferrous sulfate, gabapentin, metformin, simvastatin  SLEEP ARCHITECTURE The study was initiated at 11:05:51 PM and ended at 5:15:59 AM. Sleep onset time was 14.7 minutes and the sleep efficiency was 88.9%. The total sleep time was 328.9 minutes. Stage REM latency was 60.0 minutes. The patient spent 6.21% of the night in stage N1 sleep, 63.39% in stage N2 sleep, 0.00% in stage N3 and 30.40% in REM. Alpha intrusion was absent. Supine sleep was 21.55%.  RESPIRATORY PARAMETERS The overall apnea/hypopnea index (AHI) was 0.2 per hour. There were 1 total apneas, including 0 obstructive, 1 central and 0 mixed apneas. There were 0 hypopneas and 18 RERAs. The AHI during Stage REM sleep was 0.6 per hour. AHI while supine was 0.0 per hour. The mean oxygen saturation was 98.84%. The minimum SpO2 during sleep was 96.00%. Moderate snoring was noted during this study.  CARDIAC DATA The 2 lead EKG demonstrated sinus rhythm. The mean heart rate was 63.03 beats per  minute. Other EKG findings include: None.  LEG MOVEMENT DATA The total PLMS were 0 with a resulting PLMS index of 0.00. Associated arousal with leg movement index was 0.0 .  IMPRESSIONS - No significant obstructive sleep apnea occurred during this study (AHI = 0.2/h). - No significant central sleep apnea occurred during this study (CAI = 0.2/h). - No oxygen desaturation was noted during this study (Min O2 = 96.00%). - The patient snored with Moderate snoring volume. - No cardiac abnormalities were noted during this study. - Clinically significant periodic limb movements did not occur during sleep. No significant associated arousals.  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 - Sleep hygiene should be reviewed to assess factors that may improve sleep quality. - Weight management and regular exercise should be initiated or continued if appropriate.  Waymon BudgeYOUNG,CLINTON D Diplomate, American Board of Sleep Medicine  ELECTRONICALLY SIGNED ON:  09/06/2015, 10:23 AM [facility] PH: [phone]   FX: [phone] ACCREDITED BY THE AMERICAN ACADEMY OF SLEEP MEDICINE

## 2015-09-07 DIAGNOSIS — R0683 Snoring: Secondary | ICD-10-CM

## 2015-09-07 DIAGNOSIS — G4733 Obstructive sleep apnea (adult) (pediatric): Secondary | ICD-10-CM

## 2015-09-09 ENCOUNTER — Telehealth: Payer: Self-pay | Admitting: *Deleted

## 2015-09-09 NOTE — Telephone Encounter (Signed)
Medical Assistant left message on patient's home and cell voicemail. Voicemail states to give a call back to Ronesha Heenan with [facility] at [phone].  

## 2015-09-09 NOTE — Telephone Encounter (Signed)
-----   Message from Quentin Angstlugbemiga E Jegede, MD sent at 09/09/2015 10:15 AM EDT ----- Please inform patient that her sleep study showed no significant obstructive sleep apnea and no significant oxygen desaturation but has moderate snoring volume. RECOMMENDATIONS - Avoid alcohol, sedatives and other CNS depressants that may worsen sleep apnea and disrupt normal sleep architecture. - Sleep hygiene should be reviewed to assess factors that may improve sleep quality. - Weight management and regular exercise.

## 2015-09-09 NOTE — Telephone Encounter (Signed)
Patient verified DOB Patient is aware of Sleep Apnea and O2 desaturation not being present during the sleep study. Patient is aware of an increase in snoring volume. Patient advised to implement better sleep hygiene. Patient advised to manage weight as well to assist with sleep patterns. Patient was scheduled on 09/24/15 at 2:15 to discuss FMLA paperwork. No further questions at this time.

## 2015-09-24 ENCOUNTER — Encounter: Payer: Self-pay | Admitting: Internal Medicine

## 2015-09-24 ENCOUNTER — Telehealth: Payer: Self-pay | Admitting: *Deleted

## 2015-09-24 ENCOUNTER — Ambulatory Visit: Payer: Self-pay | Attending: Internal Medicine | Admitting: Internal Medicine

## 2015-09-24 VITALS — BP 138/78 | HR 81 | Temp 98.2°F | Resp 18 | Ht 64.0 in | Wt 201.6 lb

## 2015-09-24 DIAGNOSIS — E785 Hyperlipidemia, unspecified: Secondary | ICD-10-CM | POA: Insufficient documentation

## 2015-09-24 DIAGNOSIS — Z794 Long term (current) use of insulin: Secondary | ICD-10-CM | POA: Insufficient documentation

## 2015-09-24 DIAGNOSIS — E1142 Type 2 diabetes mellitus with diabetic polyneuropathy: Secondary | ICD-10-CM | POA: Insufficient documentation

## 2015-09-24 DIAGNOSIS — Z7982 Long term (current) use of aspirin: Secondary | ICD-10-CM | POA: Insufficient documentation

## 2015-09-24 DIAGNOSIS — Z79899 Other long term (current) drug therapy: Secondary | ICD-10-CM | POA: Insufficient documentation

## 2015-09-24 DIAGNOSIS — Z7984 Long term (current) use of oral hypoglycemic drugs: Secondary | ICD-10-CM | POA: Insufficient documentation

## 2015-09-24 DIAGNOSIS — I10 Essential (primary) hypertension: Secondary | ICD-10-CM | POA: Insufficient documentation

## 2015-09-24 LAB — GLUCOSE, POCT (MANUAL RESULT ENTRY)
POC GLUCOSE: 428 mg/dL — AB (ref 70–99)
POC Glucose: 46 mg/dl — AB (ref 70–99)

## 2015-09-24 LAB — POCT GLYCOSYLATED HEMOGLOBIN (HGB A1C): HEMOGLOBIN A1C: 11.7

## 2015-09-24 MED FILL — !LANTUS SOLOSTAR 100UNITS/M: 100 | 15 days supply | Qty: 3 | Fill #1

## 2015-09-24 MED FILL — TRUE METRIX TEST STRIP: 15 days supply | Qty: 50 | Fill #1

## 2015-09-24 MED FILL — !NOVOLOG 100UNITS/ML VIAL: 100/ML | 30 days supply | Qty: 10 | Fill #1

## 2015-09-24 NOTE — Patient Instructions (Signed)
Diabetes Mellitus and Food It is important for you to manage your blood sugar (glucose) level. Your blood glucose level can be greatly affected by what you eat. Eating healthier foods in the appropriate amounts throughout the day at about the same time each day will help you control your blood glucose level. It can also help slow or prevent worsening of your diabetes mellitus. Healthy eating may even help you improve the level of your blood pressure and reach or maintain a healthy weight.  General recommendations for healthful eating and cooking habits include:  Eating meals and snacks regularly. Avoid going long periods of time without eating to lose weight.  Eating a diet that consists mainly of plant-based foods, such as fruits, vegetables, nuts, legumes, and whole grains.  Using low-heat cooking methods, such as baking, instead of high-heat cooking methods, such as deep frying. Work with your dietitian to make sure you understand how to use the Nutrition Facts information on food labels. HOW CAN FOOD AFFECT ME? Carbohydrates Carbohydrates affect your blood glucose level more than any other type of food. Your dietitian will help you determine how many carbohydrates to eat at each meal and teach you how to count carbohydrates. Counting carbohydrates is important to keep your blood glucose at a healthy level, especially if you are using insulin or taking certain medicines for diabetes mellitus. Alcohol Alcohol can cause sudden decreases in blood glucose (hypoglycemia), especially if you use insulin or take certain medicines for diabetes mellitus. Hypoglycemia can be a life-threatening condition. Symptoms of hypoglycemia (sleepiness, dizziness, and disorientation) are similar to symptoms of having too much alcohol.  If your health care provider has given you approval to drink alcohol, do so in moderation and use the following guidelines:  Women should not have more than one drink per day, and men  should not have more than two drinks per day. One drink is equal to:  12 oz of beer.  5 oz of wine.  1 oz of hard liquor.  Do not drink on an empty stomach.  Keep yourself hydrated. Have water, diet soda, or unsweetened iced tea.  Regular soda, juice, and other mixers might contain a lot of carbohydrates and should be counted. WHAT FOODS ARE NOT RECOMMENDED? As you make food choices, it is important to remember that all foods are not the same. Some foods have fewer nutrients per serving than other foods, even though they might have the same number of calories or carbohydrates. It is difficult to get your body what it needs when you eat foods with fewer nutrients. Examples of foods that you should avoid that are high in calories and carbohydrates but low in nutrients include:  Trans fats (most processed foods list trans fats on the Nutrition Facts label).  Regular soda.  Juice.  Candy.  Sweets, such as cake, pie, doughnuts, and cookies.  Fried foods. WHAT FOODS CAN I EAT? Eat nutrient-rich foods, which will nourish your body and keep you healthy. The food you should eat also will depend on several factors, including:  The calories you need.  The medicines you take.  Your weight.  Your blood glucose level.  Your blood pressure level.  Your cholesterol level. You should eat a variety of foods, including:  Protein.  Lean cuts of meat.  Proteins low in saturated fats, such as fish, egg whites, and beans. Avoid processed meats.  Fruits and vegetables.  Fruits and vegetables that may help control blood glucose levels, such as apples, mangoes, and   yams.  Dairy products.  Choose fat-free or low-fat dairy products, such as milk, yogurt, and cheese.  Grains, bread, pasta, and rice.  Choose whole grain products, such as multigrain bread, whole oats, and brown rice. These foods may help control blood pressure.  Fats.  Foods containing healthful fats, such as nuts,  avocado, olive oil, canola oil, and fish. DOES EVERYONE WITH DIABETES MELLITUS HAVE THE SAME MEAL PLAN? Because every person with diabetes mellitus is different, there is not one meal plan that works for everyone. It is very important that you meet with a dietitian who will help you create a meal plan that is just right for you.   This information is not intended to replace advice given to you by your health care provider. Make sure you discuss any questions you have with your health care provider.   Document Released: 11/18/2004 Document Revised: 03/14/2014 Document Reviewed: 01/18/2013 Elsevier Interactive Patient Education 2016 [place] Carbohydrate Counting for Diabetes Mellitus Carbohydrate counting is a method for keeping track of the amount of carbohydrates you eat. Eating carbohydrates naturally increases the level of sugar (glucose) in your blood, so it is important for you to know the amount that is okay for you to have in every meal. Carbohydrate counting helps keep the level of glucose in your blood within normal limits. The amount of carbohydrates allowed is different for every person. A dietitian can help you calculate the amount that is right for you. Once you know the amount of carbohydrates you can have, you can count the carbohydrates in the foods you want to eat. Carbohydrates are found in the following foods:  Grains, such as breads and cereals.  Dried beans and soy products.  Starchy vegetables, such as potatoes, peas, and corn.  Fruit and fruit juices.  Milk and yogurt.  Sweets and snack foods, such as cake, cookies, candy, chips, soft drinks, and fruit drinks. CARBOHYDRATE COUNTING There are two ways to count the carbohydrates in your food. You can use either of the methods or a combination of both. Reading the "Nutrition Facts" on [place] The "Nutrition Facts" is an area that is included on the labels of almost all packaged food and beverages in the  [place]. It includes the serving size of that food or beverage and information about the nutrients in each serving of the food, including the grams (g) of carbohydrate per serving.  Decide the number of servings of this food or beverage that you will be able to eat or drink. Multiply that number of servings by the number of grams of carbohydrate that is listed on the label for that serving. The total will be the amount of carbohydrates you will be having when you eat or drink this food or beverage. Learning Standard Serving Sizes of Food When you eat food that is not packaged or does not include "Nutrition Facts" on the label, you need to measure the servings in order to count the amount of carbohydrates.A serving of most carbohydrate-rich foods contains about 15 g of carbohydrates. The following list includes serving sizes of carbohydrate-rich foods that provide 15 g ofcarbohydrate per serving:   1 slice of bread (1 oz) or 1 six-inch tortilla.    of a hamburger bun or English muffin.  4-6 crackers.   cup unsweetened dry cereal.    cup hot cereal.   cup rice or pasta.    cup mashed potatoes or  of a large baked potato.  1 cup fresh  fruit or one small piece of fruit.    cup canned or frozen fruit or fruit juice.  1 cup milk.   cup plain fat-free yogurt or yogurt sweetened with artificial sweeteners.   cup cooked dried beans or starchy vegetable, such as peas, corn, or potatoes.  Decide the number of standard-size servings that you will eat. Multiply that number of servings by 15 (the grams of carbohydrates in that serving). For example, if you eat 2 cups of strawberries, you will have eaten 2 servings and 30 g of carbohydrates (2 servings x 15 g = 30 g). For foods such as soups and casseroles, in which more than one food is mixed in, you will need to count the carbohydrates in each food that is included. EXAMPLE OF CARBOHYDRATE COUNTING Sample Dinner  3 oz chicken  breast.   cup of brown rice.   cup of corn.  1 cup milk.   1 cup strawberries with sugar-free whipped topping.  Carbohydrate Calculation Step 1: Identify the foods that contain carbohydrates:   Rice.   Corn.   Milk.   Strawberries. Step 2:Calculate the number of servings eaten of each:   2 servings of rice.   1 serving of corn.   1 serving of milk.   1 serving of strawberries. Step 3: Multiply each of those number of servings by 15 g:   2 servings of rice x 15 g = 30 g.   1 serving of corn x 15 g = 15 g.   1 serving of milk x 15 g = 15 g.   1 serving of strawberries x 15 g = 15 g. Step 4: Add together all of the amounts to find the total grams of carbohydrates eaten: 30 g + 15 g + 15 g + 15 g = 75 g.   This information is not intended to replace advice given to you by your health care provider. Make sure you discuss any questions you have with your health care provider.   Document Released: 02/21/2005 Document Revised: 03/14/2014 Document Reviewed: 01/18/2013 Elsevier Interactive Patient Education [employer] Elsevier Inc.

## 2015-09-24 NOTE — Telephone Encounter (Signed)
Medical Assistant left message on patient's home and cell voicemail. Voicemail states to give a call back to [place] with [facility] at [phone].  !!Please inform patient of paperwork being faxed and patient may obtain hard copy from the front desk!!!

## 2015-09-24 NOTE — Addendum Note (Signed)
Addended by: Jeanann LewandowskyJEGEDE, Manoah Deckard E on: 09/24/2015 03:30 PM   Modules accepted: Orders

## 2015-09-24 NOTE — Addendum Note (Signed)
Addended by: Margaretmary LombardLISBON, NUBIA K on: 09/24/2015 03:42 PM   Modules accepted: Orders

## 2015-09-24 NOTE — Progress Notes (Signed)
Patient ID: Mariah Miller, female   DOB: 11-03-1975, 40 y.o.   MRN: [id number]   Mariah Miller, is a 40 y.o. female  [id number]  [id number]  DOB - 12/21/1975  Chief Complaint  Patient presents with  . Follow-up    FMLA        Subjective:   Mariah Miller is a 40 y.o. female with history of hypertension, type 2 diabetes mellitus uncontrolled with recent hemoglobin A1c of 11.4%, dyslipidemia and morbid obesity here today for a follow up visit and medication management. Patient was recently seen in our emergency room with complaints of bilateral frontal headache associated with allergy symptoms typical of her allergies flare up. There was also a report of questionable seizure that was attributed to hypoglycemia. Patient's blood sugar was recorded as 40 around the episode of seizure according to patient's boyfriend. For this reason her insulin 70/30 was discontinued and she was started on regular insulin NovoLog at 20 units daily at bedtime, but patient still continued to have hypoglycemic episodes but largely asymptomatic. Today in the clinic her blood sugar was 46. Patient has been given Juice, and a repeat blood sugar was normal. Patient has no other complaints today. She recently welcomed a second grandchild, which makes her happy. She does not need refill of any meds today. She needs a paper filled out for FMLA in case she needs to take off from work because of Diabetes or it's complication. She claims she is doing well. No significant concern. Patient has No headache, No chest pain, No abdominal pain - No Nausea, No new weakness tingling or numbness, No Cough - SOB.  No problems updated.  ALLERGIES: Allergies  Allergen Reactions  . Tomato Itching and Rash    PAST MEDICAL HISTORY: Past Medical History  Diagnosis Date  . Hypertension   . Diabetes mellitus without complication ([place])   . Migraine   . Seizures ([place])     self reported    MEDICATIONS AT HOME: Prior to  Admission medications   Medication Sig Start Date End Date Taking? Authorizing Provider  aspirin EC 81 MG tablet Take 1 tablet (81 mg total) by mouth daily. 01/22/13  Yes Nishant Dhungel, MD  Blood Glucose Monitoring Suppl (TRUE METRIX METER) w/Device KIT USE AS DIRECTED 05/01/15  Yes Mariah Offield Essie Christine, MD  ferrous sulfate 325 (65 FE) MG tablet Take 1 tablet (325 mg total) by mouth 2 (two) times daily with a meal. 04/30/15  Yes Mariah Dannemiller E Doreene Burke, MD  gabapentin (NEURONTIN) 300 MG capsule Take 1 capsule (300 mg total) by mouth 3 (three) times daily. 04/30/15  Yes Mariah Garter, MD  glucose blood (TRUE METRIX BLOOD GLUCOSE TEST) test strip Use as instructed 05/01/15  Yes Mariah Sonnenfeld E Doreene Burke, MD  insulin aspart (NOVOLOG) 100 UNIT/ML injection Inject 3-10 Units into the skin 3 (three) times daily as needed for high blood sugar. 150 - 200 = 3 units, 201 - 250 = 5 units, 251 - 300 = 7 units 301 - 350 = 10 units, > 350, call or come to clinic 04/30/15  Yes Mariah Brueggemann E Doreene Burke, MD  insulin regular (NOVOLIN R,HUMULIN R) 100 units/mL injection Inject 30 Units into the skin at bedtime.   Yes Historical Provider, MD  lisinopril-hydrochlorothiazide (PRINZIDE,ZESTORETIC) 20-12.5 MG tablet Take 1 tablet by mouth daily. 04/30/15  Yes Mariah Garter, MD  metFORMIN (GLUCOPHAGE) 1000 MG tablet Take 1 tablet (1,000 mg total) by mouth 2 (two) times daily with a meal. 04/30/15  Yes Mariah Miller  Essie Christine, MD  simvastatin (ZOCOR) 10 MG tablet Take 1 tablet (10 mg total) by mouth at bedtime. 04/30/15  Yes Mariah Garter, MD  TRUEPLUS LANCETS 28G MISC USE AS INSTRUCTED 05/01/15  Yes Mariah Garter, MD     Objective:   Filed Vitals:   09/24/15 1452  BP: 138/78  Pulse: 81  Temp: 98.2 F (36.8 C)  TempSrc: Oral  Resp: 18  Height: '5\' 4"'$  (1.626 m)  Weight: 201 lb 9.6 oz (91.445 kg)  SpO2: 98%    Exam General appearance : Awake, alert, not in any distress. Speech Clear. Not toxic looking, obese HEENT:  Atraumatic and Normocephalic, pupils equally reactive to light and accomodation Neck: Supple, no JVD. No cervical lymphadenopathy.  Chest: Good air entry bilaterally, no added sounds  CVS: S1 S2 regular, no murmurs.  Abdomen: Bowel sounds present, Non tender and not distended with no gaurding, rigidity or rebound. Extremities: B/L Lower Ext shows no edema, both legs are warm to touch Neurology: Awake alert, and oriented X 3, CN II-XII intact, Non focal Skin: No Rash  Data Review Lab Results  Component Value Date   HGBA1C 11.7 09/24/2015   HGBA1C 11.4 04/30/2015   HGBA1C 8.0 03/17/2014     Assessment & Plan   1. Type 2 diabetes mellitus with diabetic polyneuropathy, with long-term current use of insulin (HCC)  - POCT A1C - Glucose (CBG) - Microalbumin/Creatinine Ratio, Urine - Ambulatory referral to Podiatry  - Bring all insulin injection bottles to the clinic on Monday 09/28/2015 for evaluation and medication management - Hypoglycemic precaution emphasized - Diabetic diet counseling and education  Aim for 30 minutes of exercise most days. Rethink what you drink. Water is great! Aim for 2-3 Carb Choices per meal (30-45 grams) +/- 1 either way  Aim for 0-15 Carbs per snack if hungry  Include protein in moderation with your meals and snacks  Consider reading food labels for Total Carbohydrate and Fat Grams of foods  Consider checking BG at alternate times per day  Continue taking medication as directed Be mindful about how much sugar you are adding to beverages and other foods. Fruit Punch - find one with no sugar  Measure and decrease portions of carbohydrate foods  Make your plate and don't go back for seconds   2. Dyslipidemia  To address this please limit saturated fat to no more than 7% of your calories, limit cholesterol to 200 mg/day, increase fiber and exercise as tolerated. If needed we may add another cholesterol lowering medication to your regimen.   3.  Essential hypertension  We have discussed target BP range and blood pressure goal. I have advised patient to check BP regularly and to call us back or report to clinic if the numbers are consistently higher than 140/90. We discussed the importance of compliance with medical therapy and DASH diet recommended, consequences of uncontrolled hypertension discussed.  - continue current BP medications  Patient have been counseled extensively about nutrition and exercise  FMLA papers filled out.  Return in about 4 days (around 09/28/2015) for [facility]PP [facility] for BP/CBG and DM management.  The patient was given clear instructions to go to ER or return to medical center if symptoms don't improve, worsen or new problems develop. The patient verbalized understanding. The patient was told to call to get lab results if they haven't heard anything in the next week.   This note has been created with Surveyor, quantity. Any  transcriptional errors are unintentional.    Angelica Chessman, MD, [place], [place], [place], [place] and [place], [place]   09/24/2015, 3:19 PM

## 2015-09-24 NOTE — Progress Notes (Signed)
Patient is here for FMLA paperwork  Patient denies pain at this time.  Patient has taken medication today and patient has only eaten a peach.  Patient was provided a soda and asked to drink while in the office to bring her sugar level back up.

## 2015-09-25 LAB — MICROALBUMIN / CREATININE URINE RATIO
CREATININE, URINE: 86 mg/dL (ref 20–320)
MICROALB UR: 1.8 mg/dL
Microalb Creat Ratio: 21 mcg/mg creat (ref <30)

## 2015-09-29 ENCOUNTER — Ambulatory Visit: Payer: Self-pay | Admitting: Pharmacist

## 2015-10-07 ENCOUNTER — Encounter: Payer: Self-pay | Admitting: Student-PharmD

## 2015-10-08 ENCOUNTER — Ambulatory Visit: Payer: PRIVATE HEALTH INSURANCE | Attending: Internal Medicine | Admitting: Pharmacist

## 2015-10-08 VITALS — BP 135/85

## 2015-10-08 DIAGNOSIS — Z794 Long term (current) use of insulin: Secondary | ICD-10-CM | POA: Diagnosis not present

## 2015-10-08 DIAGNOSIS — E1142 Type 2 diabetes mellitus with diabetic polyneuropathy: Secondary | ICD-10-CM

## 2015-10-08 MED ORDER — GLUCOSE BLOOD VI STRP: ORAL_STRIP | 12 refills | Status: DC

## 2015-10-08 MED ORDER — INSULIN GLARGINE 100 UNIT/ML SOLOSTAR PEN: 24.0000 [IU] | PEN_INJECTOR | Freq: Every day | SUBCUTANEOUS | 3 refills | Status: DC

## 2015-10-08 MED FILL — !LANTUS SOLOSTAR 100UNITS/M: 100 | 25 days supply | Qty: 6 | Fill #0

## 2015-10-08 NOTE — Patient Instructions (Signed)
Thanks for coming to see [place]!  Increase Lantus to 24 units daily  Do not take more than Novolog 5 units before your last meal before bed.  It is much better to be a little too high rather than ever too low.  Come back in 2 weeks unless you have another low - then you need to come back sooner.   Hypoglycemia Low blood sugar (hypoglycemia) means that the level of sugar in your blood is lower than it should be. Signs of low blood sugar include:  Getting sweaty.  Feeling hungry.  Feeling dizzy or weak.  Feeling sleepier than normal.  Feeling nervous.  Headaches.  Having a fast heartbeat. Low blood sugar can happen fast and can be an emergency. Your doctor can do tests to check your blood sugar level. You can have low blood sugar and not have diabetes. HOME CARE  Check your blood sugar as told by your doctor. If it is less than 70 mg/dl or as told by your doctor, take 1 of the following:  3 to 4 glucose tablets.   cup clear juice.   cup soda pop, not diet.  1 cup milk.  5 to 6 hard candies.  Recheck blood sugar after 15 minutes. Repeat until it is at the right level.  Eat a snack if it is more than 1 hour until the next meal.  Only take medicine as told by your doctor.  Do not skip meals. Eat on time.  Do not drink alcohol except with meals.  Check your blood glucose before driving.  Check your blood glucose before and after exercise.  Always carry treatment with you, such as glucose pills.  Always wear a medical alert bracelet if you have diabetes. GET HELP RIGHT AWAY IF:   Your blood glucose goes below 70 mg/dl or as told by your doctor, and you:  Are confused.  Are not able to swallow.  Pass out (faint).  You cannot treat yourself. You may need someone to help you.  You have low blood sugar problems often.  You have problems from your medicines.  You are not feeling better after 3 to 4 days.  You have vision changes. MAKE SURE YOU:    Understand these instructions.  Will watch this condition.  Will get help right away if you are not doing well or get worse.   This information is not intended to replace advice given to you by your health care provider. Make sure you discuss any questions you have with your health care provider.   Document Released: 05/18/2009 Document Revised: 03/14/2014 Document Reviewed: 10/28/2014 Elsevier Interactive Patient Education [employer]

## 2015-10-08 NOTE — Progress Notes (Signed)
    S:    Patient arrives in good spirits.  Presents for diabetes evaluation, education, and management at the request of Dr. Hyman Hopes. Patient was referred on 09/24/15.  Patient was last seen by Primary Care Provider on 09/24/15.   Patient reports adherence with medications.  Current diabetes medications include: Lantus 20 units daily and Novolog sliding scale  Patient denies being on regular insulin and has not used it in many years.   Patient reports hypoglycemic events. She had another episode of hypoglycemia when she was asleep - she reports a reading of 50. Her boyfriend brought her orange juice and she was able to successfully resolve the hypoglycemia.  Patient reported dietary habits: patient eats smaller meals prior to going to sleep (she works night shift at [employer]). She reports consistently eating throughout the night at work.   Patient reported exercise habits: walks all night at work.   Patient denies nocturia.  Patient denies neuropathy. Patient denies visual changes. Patient reports self foot exams.    O:  Lab Results  Component Value Date   HGBA1C 11.7 09/24/2015   There were no vitals filed for this visit.  Home fasting CBG: 160s-200s  2 hour post-prandial/random CBG: 120s-300s  A/P: Diabetes longstanding currently uncontrolled based on A1c 50f 11.7. Patient reports hypoglycemic events and is able to verbalize appropriate hypoglycemia management plan. Patient reports adherence with medication. Control is suboptimal due to hypoglycemia and dietary indiscretion.  Increased dose of basal insulin Lantus (insulin glargine) to 24 units daily. Continued rapid insulin Novolog (insulin aspart) sliding scale and instructed patient not to take the Novolog if she doesn't plan to eat a full meal and to not take more than 5 units for the meal prior to going to sleep. I am hopeful that this will get rid of her hypoglycemia during the night and allow up to continue to  titrate the Lantus to get to goal for fastings.   Next A1C anticipated October 2017.    Written patient instructions provided.  Total time in face to face counseling 30 minutes.   Follow up in Pharmacist Clinic Visit in 2 weeks.   Patient seen with Tasia Catchings, PharmD Candidate

## 2015-11-19 ENCOUNTER — Ambulatory Visit: Payer: Self-pay | Admitting: Podiatry

## 2015-11-30 ENCOUNTER — Ambulatory Visit: Payer: Self-pay | Admitting: Podiatry

## 2015-12-16 MED FILL — !NOVOLOG 100UNITS/ML VIAL: 100/ML | 30 days supply | Qty: 10 | Fill #2

## 2015-12-16 MED FILL — TRUE METRIX GLUCOSE TEST ST: 15 days supply | Qty: 50 | Fill #2

## 2015-12-16 MED FILL — !LANTUS SOLOSTAR 100UNITS/M: 100 | 25 days supply | Qty: 6 | Fill #1

## 2016-03-04 MED FILL — !LANTUS SOLOSTAR 100UNITS/M: 100 | 25 days supply | Qty: 6 | Fill #2

## 2016-03-04 MED FILL — !NOVOLOG 100UNITS/ML VIAL: 100/ML | 30 days supply | Qty: 10 | Fill #3

## 2016-03-09 ENCOUNTER — Encounter (HOSPITAL_COMMUNITY): Payer: Self-pay | Admitting: Emergency Medicine

## 2016-03-09 DIAGNOSIS — I10 Essential (primary) hypertension: Secondary | ICD-10-CM | POA: Insufficient documentation

## 2016-03-09 DIAGNOSIS — Z7982 Long term (current) use of aspirin: Secondary | ICD-10-CM | POA: Insufficient documentation

## 2016-03-09 DIAGNOSIS — Z79899 Other long term (current) drug therapy: Secondary | ICD-10-CM | POA: Insufficient documentation

## 2016-03-09 DIAGNOSIS — R1012 Left upper quadrant pain: Secondary | ICD-10-CM | POA: Insufficient documentation

## 2016-03-09 DIAGNOSIS — E1140 Type 2 diabetes mellitus with diabetic neuropathy, unspecified: Secondary | ICD-10-CM | POA: Insufficient documentation

## 2016-03-09 DIAGNOSIS — Z87891 Personal history of nicotine dependence: Secondary | ICD-10-CM | POA: Insufficient documentation

## 2016-03-09 DIAGNOSIS — Z794 Long term (current) use of insulin: Secondary | ICD-10-CM | POA: Insufficient documentation

## 2016-03-09 LAB — URINALYSIS, ROUTINE W REFLEX MICROSCOPIC
Bacteria, UA: NONE SEEN
Bilirubin Urine: NEGATIVE
Hgb urine dipstick: NEGATIVE
Ketones, ur: NEGATIVE mg/dL
Leukocytes, UA: NEGATIVE
Nitrite: NEGATIVE
PH: 5 (ref 5.0–8.0)
PROTEIN: NEGATIVE mg/dL
SPECIFIC GRAVITY, URINE: 1.037 — AB (ref 1.005–1.030)

## 2016-03-09 LAB — CBC
HCT: 36.4 % (ref 36.0–46.0)
Hemoglobin: 12.2 g/dL (ref 12.0–15.0)
MCH: 28.4 pg (ref 26.0–34.0)
MCHC: 33.5 g/dL (ref 30.0–36.0)
MCV: 84.7 fL (ref 78.0–100.0)
PLATELETS: 341 10*3/uL (ref 150–400)
RBC: 4.3 MIL/uL (ref 3.87–5.11)
RDW: 13.1 % (ref 11.5–15.5)
WBC: 7.4 10*3/uL (ref 4.0–10.5)

## 2016-03-09 LAB — I-STAT BETA HCG BLOOD, ED (MC, WL, AP ONLY): I-stat hCG, quantitative: <5 m[IU]/mL (ref <5)

## 2016-03-09 LAB — COMPREHENSIVE METABOLIC PANEL
ALK PHOS: 65 U/L (ref 38–126)
ALT: 12 U/L — AB (ref 14–54)
AST: 14 U/L — AB (ref 15–41)
Albumin: 3.5 g/dL (ref 3.5–5.0)
Anion gap: 8 (ref 5–15)
BUN: 8 mg/dL (ref 6–20)
CALCIUM: 9.3 mg/dL (ref 8.9–10.3)
CHLORIDE: 104 mmol/L (ref 101–111)
CO2: 22 mmol/L (ref 22–32)
CREATININE: 0.87 mg/dL (ref 0.44–1.00)
GFR calc Af Amer: >60 mL/min (ref >60)
Glucose, Bld: 362 mg/dL — ABNORMAL HIGH (ref 65–99)
Potassium: 4.3 mmol/L (ref 3.5–5.1)
SODIUM: 134 mmol/L — AB (ref 135–145)
Total Bilirubin: 0.4 mg/dL (ref 0.3–1.2)
Total Protein: 6.9 g/dL (ref 6.5–8.1)

## 2016-03-09 LAB — LIPASE, BLOOD: LIPASE: 41 U/L (ref 11–51)

## 2016-03-09 NOTE — ED Triage Notes (Signed)
Pt presents to ED for assessment LUQ pain after having multiple episodes of vomiting x 4 days.  Pt denies diarrhea.

## 2016-03-10 ENCOUNTER — Emergency Department (HOSPITAL_COMMUNITY)
Admission: EM | Admit: 2016-03-10 | Discharge: 2016-03-10 | Disposition: A | Discharge status: Home / Self Care | Payer: Self-pay | Attending: Emergency Medicine | Admitting: Emergency Medicine

## 2016-03-10 DIAGNOSIS — R1012 Left upper quadrant pain: Secondary | ICD-10-CM

## 2016-03-10 MED ORDER — ONDANSETRON 4 MG PO TBDP
4.0000 mg | ORAL_TABLET | Freq: Once | ORAL | Status: AC
  Administered 2016-03-10: 4 mg via ORAL
  Filled 2016-03-10: qty 1

## 2016-03-10 MED ORDER — NAPROXEN 250 MG PO TABS
500.0000 mg | ORAL_TABLET | Freq: Once | ORAL | Status: AC
  Administered 2016-03-10: 500 mg via ORAL
  Filled 2016-03-10: qty 2

## 2016-03-10 NOTE — Discharge Instructions (Signed)
We saw you in the ER for the abdominal pain. All of our results are normal, including all labs and imaging. Kidney function is fine as well. We are not sure what is causing your abdominal pain, and recommend that you see your primary care doctor within 2-3 days for further evaluation. If your symptoms get worse, return to the ER. T

## 2016-03-10 NOTE — ED Notes (Signed)
Fluid challenge completed. Pt tolerated fluids without nausea or vomiting.

## 2016-03-10 NOTE — ED Provider Notes (Signed)
[facility] DEPT Provider Note   CSN: [id number] Arrival date & time: 03/09/16  2232  By signing my name below, I, Delton Prairie, attest that this documentation has been prepared under the direction and in the presence of Varney Biles, MD  Electronically Signed: Delton Prairie, ED Scribe. 03/10/16. 3:59 AM.  History   Chief Complaint Chief Complaint  Patient presents with  . Abdominal Pain  . Emesis   The history is provided by the patient. No language interpreter was used.   HPI Comments:  Mariah Miller is a 41 y.o. female, with a hx of DM and HTN, who presents to the Emergency Department complaining of sudden onset, persistent, constant, throbbing, left upper abdominal pain x 6 days. Pt also reports nausea, 4 episodes of vomiting yesterday (onset 5 days ago), diarrhea and sick contact. No alleviating factors noted. Pt denies hematuria, frequency, dysuria, vaginal discharge, vaginal bleeding, concern for STD, hx of pelvic disorders, hx of abdominal surgeries, any other associated symptoms and any other modifying factors at this time. LMP was before 02/29/16 and was normal.    Past Medical History:  Diagnosis Date  . Diabetes mellitus without complication ([place])   . Hypertension   . Migraine   . Seizures ([place])    self reported    Patient Active Problem List   Diagnosis Date Noted  . OSA (obstructive sleep apnea) 04/30/2015  . Chest pain 04/28/2015  . DM2 (diabetes mellitus, type 2) ([place]) 04/28/2015  . ASCUS with positive high risk HPV cervical 06/06/2014  . Anemia, iron deficiency 03/17/2014  . Dyslipidemia 03/17/2014  . Essential hypertension 03/17/2014  . Uncontrolled diabetes mellitus ([place]) 06/17/2013  . Hypoglycemia 04/08/2013  . Diabetic neuropathy, painful ([place]) 03/21/2013  . Other and unspecified hyperlipidemia 01/22/2013  . A C DEGENERATION-CHRONIC 10/27/2008  . SHOULDER PAIN 05/01/2008  . IMPINGEMENT SYNDROME 05/01/2008    Past Surgical History:    Procedure Laterality Date  . CHOLECYSTECTOMY    . CYST EXCISION    . SHOULDER OPEN ROTATOR CUFF REPAIR Left   . TONSILLECTOMY    . TUBAL LIGATION      OB History    Gravida Para Term Preterm AB Living   '6 3 3   3 3   '$ SAB TAB Ectopic Multiple Live Births   2       3       Home Medications    Prior to Admission medications   Medication Sig Start Date End Date Taking? Authorizing Provider  aspirin EC 81 MG tablet Take 1 tablet (81 mg total) by mouth daily. 01/22/13  Yes Nishant Dhungel, MD  Blood Glucose Monitoring Suppl (TRUE METRIX METER) w/Device KIT USE AS DIRECTED 05/01/15  Yes Olugbemiga Essie Christine, MD  ferrous sulfate 325 (65 FE) MG tablet Take 1 tablet (325 mg total) by mouth 2 (two) times daily with a meal. 04/30/15  Yes Olugbemiga E Doreene Burke, MD  gabapentin (NEURONTIN) 300 MG capsule Take 1 capsule (300 mg total) by mouth 3 (three) times daily. 04/30/15  Yes Tresa Garter, MD  glucose blood (TRUE METRIX BLOOD GLUCOSE TEST) test strip Use as instructed 10/08/15  Yes Olugbemiga E Doreene Burke, MD  insulin aspart (NOVOLOG) 100 UNIT/ML injection Inject 3-10 Units into the skin 3 (three) times daily as needed for high blood sugar. 150 - 200 = 3 units, 201 - 250 = 5 units, 251 - 300 = 7 units 301 - 350 = 10 units, > 350, call or come to clinic 04/30/15  Yes Tresa Garter, MD  Insulin Glargine (LANTUS SOLOSTAR) 100 UNIT/ML Solostar Pen Inject 24 Units into the skin daily. 10/08/15  Yes Tresa Garter, MD  lisinopril-hydrochlorothiazide (PRINZIDE,ZESTORETIC) 20-12.5 MG tablet Take 1 tablet by mouth daily. 04/30/15  Yes Tresa Garter, MD  metFORMIN (GLUCOPHAGE) 1000 MG tablet Take 1 tablet (1,000 mg total) by mouth 2 (two) times daily with a meal. 04/30/15  Yes Tresa Garter, MD  simvastatin (ZOCOR) 10 MG tablet Take 1 tablet (10 mg total) by mouth at bedtime. 04/30/15  Yes Tresa Garter, MD  TRUEPLUS LANCETS 28G MISC USE AS INSTRUCTED 05/01/15  Yes Tresa Garter, MD     Family History Family History  Problem Relation Age of Onset  . Hypertension Mother   . Thyroid disease Mother   . Irregular heart beat Mother   . Bipolar disorder Sister     Social History Social History  Substance Use Topics  . Smoking status: Former Research scientist (life sciences)  . Smokeless tobacco: Never Used     Comment: Smoked for 4 years, quit age 43  . Alcohol use 0.0 oz/week     Comment: Holidays only     Allergies   Tomato   Review of Systems Review of Systems  Constitutional: Negative for fever.  Gastrointestinal: Positive for abdominal pain, diarrhea, nausea and vomiting. Negative for blood in stool.  Genitourinary: Negative for dysuria, frequency and hematuria.  All other systems reviewed and are negative.  Physical Exam Updated Vital Signs BP 128/64   Pulse 82   Temp 98.2 F (36.8 C) (Oral)   Resp 19   Ht '5\' 4"'$  (1.626 m)   Wt 200 lb (90.7 kg)   LMP 02/25/2016   SpO2 98%   BMI 34.33 kg/m   Physical Exam  Constitutional: She is oriented to person, place, and time. She appears well-developed and well-nourished. No distress.  HENT:  Head: Normocephalic and atraumatic.  Mouth/Throat: Mucous membranes are dry.  Eyes: EOM are normal.  Sclera is clear  Neck: Normal range of motion.  Cardiovascular: Normal rate, regular rhythm and normal heart sounds.   Pulmonary/Chest: Effort normal and breath sounds normal.  Abdominal: Soft. She exhibits no distension. There is no splenomegaly. There is tenderness in the left upper quadrant. There is no rebound and no guarding.  Focal LUQ tenderness  Musculoskeletal: Normal range of motion.  Neurological: She is alert and oriented to person, place, and time.  Skin: Skin is warm and dry.  Psychiatric: She has a normal mood and affect. Judgment normal.  Nursing note and vitals reviewed.   ED Treatments / Results  DIAGNOSTIC STUDIES:  Oxygen Saturation is 100% on RA, normal by my interpretation.    COORDINATION OF  CARE:  3:39 AM Discussed treatment plan with pt at bedside and pt agreed to plan.  Labs (all labs ordered are listed, but only abnormal results are displayed) Labs Reviewed  COMPREHENSIVE METABOLIC PANEL - Abnormal; Notable for the following:       Result Value   Sodium 134 (*)    Glucose, Bld 362 (*)    AST 14 (*)    ALT 12 (*)    All other components within normal limits  URINALYSIS, ROUTINE W REFLEX MICROSCOPIC - Abnormal; Notable for the following:    Specific Gravity, Urine 1.037 (*)    Glucose, UA >=500 (*)    Squamous Epithelial / LPF 0-5 (*)    All other components within normal limits  LIPASE, BLOOD  CBC  I-STAT BETA HCG BLOOD, ED ([facility], [facility], [facility] ONLY)    EKG  EKG Interpretation None       Radiology No results found.  Procedures Procedures (including critical care time)  Medications Ordered in ED Medications  ondansetron (ZOFRAN-ODT) disintegrating tablet 4 mg (4 mg Oral Given 03/10/16 0346)  naproxen (NAPROSYN) tablet 500 mg (500 mg Oral Given 03/10/16 0346)     Initial Impression / Assessment and Plan / ED Course  I have reviewed the triage vital signs and the nursing notes.  Pertinent labs & imaging results that were available during my care of the patient were reviewed by me and considered in my medical decision making (see chart for details).  Clinical Course    Diabetic patient with obesity comes in with LUQ pain. Pain started 4 days ago, and there is associated n/v/diarrhea. Abd exam is showing focal LUQ tenderness. No UTI like symptoms and no vaginal discharge.  No imaging indicated. Pt has no pelvic pain, and pain not consistent with ovarian torsion. HCG is neg.   We will d/c. Strict ER return precautions have been discussed, and patient is agreeing with the plan and is comfortable with the workup done and the recommendations from the ER.      Final Clinical Impressions(s) / ED Diagnoses   Final diagnoses:  Left upper quadrant pain     New Prescriptions Discharge Medication List as of 03/10/2016  7:01 AM    I personally performed the services described in this documentation, which was scribed in my presence. The recorded information has been reviewed and is accurate.    Varney Biles, MD 03/14/16 2256

## 2016-05-25 ENCOUNTER — Ambulatory Visit: Payer: Self-pay | Attending: Internal Medicine | Admitting: Internal Medicine

## 2016-05-25 ENCOUNTER — Ambulatory Visit: Payer: Self-pay | Admitting: Licensed Clinical Social Worker

## 2016-05-25 ENCOUNTER — Encounter: Payer: Self-pay | Admitting: Internal Medicine

## 2016-05-25 VITALS — BP 131/82 | HR 111 | Temp 98.7°F | Resp 18 | Ht 65.0 in | Wt 206.2 lb

## 2016-05-25 DIAGNOSIS — F331 Major depressive disorder, recurrent, moderate: Secondary | ICD-10-CM | POA: Insufficient documentation

## 2016-05-25 DIAGNOSIS — E1165 Type 2 diabetes mellitus with hyperglycemia: Secondary | ICD-10-CM

## 2016-05-25 DIAGNOSIS — I10 Essential (primary) hypertension: Secondary | ICD-10-CM | POA: Insufficient documentation

## 2016-05-25 DIAGNOSIS — Z818 Family history of other mental and behavioral disorders: Secondary | ICD-10-CM | POA: Insufficient documentation

## 2016-05-25 DIAGNOSIS — Z6834 Body mass index (BMI) 34.0-34.9, adult: Secondary | ICD-10-CM | POA: Insufficient documentation

## 2016-05-25 DIAGNOSIS — G5601 Carpal tunnel syndrome, right upper limb: Secondary | ICD-10-CM | POA: Insufficient documentation

## 2016-05-25 DIAGNOSIS — Z7982 Long term (current) use of aspirin: Secondary | ICD-10-CM | POA: Insufficient documentation

## 2016-05-25 DIAGNOSIS — Z8349 Family history of other endocrine, nutritional and metabolic diseases: Secondary | ICD-10-CM | POA: Insufficient documentation

## 2016-05-25 DIAGNOSIS — Z8249 Family history of ischemic heart disease and other diseases of the circulatory system: Secondary | ICD-10-CM | POA: Insufficient documentation

## 2016-05-25 DIAGNOSIS — Z794 Long term (current) use of insulin: Secondary | ICD-10-CM | POA: Insufficient documentation

## 2016-05-25 DIAGNOSIS — Z79899 Other long term (current) drug therapy: Secondary | ICD-10-CM | POA: Insufficient documentation

## 2016-05-25 DIAGNOSIS — R87610 Atypical squamous cells of undetermined significance on cytologic smear of cervix (ASC-US): Secondary | ICD-10-CM

## 2016-05-25 DIAGNOSIS — E1142 Type 2 diabetes mellitus with diabetic polyneuropathy: Secondary | ICD-10-CM | POA: Insufficient documentation

## 2016-05-25 DIAGNOSIS — G43909 Migraine, unspecified, not intractable, without status migrainosus: Secondary | ICD-10-CM | POA: Insufficient documentation

## 2016-05-25 DIAGNOSIS — E785 Hyperlipidemia, unspecified: Secondary | ICD-10-CM | POA: Insufficient documentation

## 2016-05-25 DIAGNOSIS — IMO0001 Reserved for inherently not codable concepts without codable children: Secondary | ICD-10-CM

## 2016-05-25 DIAGNOSIS — Z87891 Personal history of nicotine dependence: Secondary | ICD-10-CM | POA: Insufficient documentation

## 2016-05-25 DIAGNOSIS — D509 Iron deficiency anemia, unspecified: Secondary | ICD-10-CM | POA: Insufficient documentation

## 2016-05-25 LAB — CBC WITH DIFFERENTIAL/PLATELET
BASOS ABS: 0 {cells}/uL (ref 0–200)
BASOS PCT: 0 %
EOS ABS: 83 {cells}/uL (ref 15–500)
Eosinophils Relative: 1 %
HCT: 37 % (ref 35.0–45.0)
HEMOGLOBIN: 11.9 g/dL (ref 11.7–15.5)
LYMPHS ABS: 3735 {cells}/uL (ref 850–3900)
Lymphocytes Relative: 45 %
MCH: 28 pg (ref 27.0–33.0)
MCHC: 32.2 g/dL (ref 32.0–36.0)
MCV: 87.1 fL (ref 80.0–100.0)
MONO ABS: 913 {cells}/uL (ref 200–950)
MPV: 9.7 fL (ref 7.5–12.5)
Monocytes Relative: 11 %
NEUTROS ABS: 3569 {cells}/uL (ref 1500–7800)
Neutrophils Relative %: 43 %
PLATELETS: 388 10*3/uL (ref 140–400)
RBC: 4.25 MIL/uL (ref 3.80–5.10)
RDW: 15.3 % — ABNORMAL HIGH (ref 11.0–15.0)
WBC: 8.3 10*3/uL (ref 3.8–10.8)

## 2016-05-25 LAB — GLUCOSE, POCT (MANUAL RESULT ENTRY): POC GLUCOSE: 174 mg/dL — AB (ref 70–99)

## 2016-05-25 LAB — POCT GLYCOSYLATED HEMOGLOBIN (HGB A1C): Hemoglobin A1C: 12.2

## 2016-05-25 LAB — TSH: TSH: 2.52 m[IU]/L

## 2016-05-25 MED ORDER — FLUOXETINE HCL 40 MG PO CAPS: 40.0000 mg | ORAL_CAPSULE | Freq: Every day | ORAL | 3 refills | Status: DC

## 2016-05-25 MED ORDER — INSULIN GLARGINE 100 UNIT/ML SOLOSTAR PEN: 30.0000 [IU] | PEN_INJECTOR | Freq: Every day | SUBCUTANEOUS | 3 refills | Status: DC

## 2016-05-25 MED ORDER — SIMVASTATIN 10 MG PO TABS: 10.0000 mg | ORAL_TABLET | Freq: Every day | ORAL | 3 refills | Status: DC

## 2016-05-25 MED ORDER — METFORMIN HCL 1000 MG PO TABS: 1000.0000 mg | ORAL_TABLET | Freq: Two times a day (BID) | ORAL | 3 refills | Status: DC

## 2016-05-25 MED ORDER — FERROUS SULFATE 325 (65 FE) MG PO TABS: 325.0000 mg | ORAL_TABLET | Freq: Two times a day (BID) | ORAL | 3 refills | Status: DC

## 2016-05-25 MED ORDER — GABAPENTIN 300 MG PO CAPS: 300.0000 mg | ORAL_CAPSULE | Freq: Three times a day (TID) | ORAL | 3 refills | Status: DC

## 2016-05-25 MED ORDER — LISINOPRIL-HYDROCHLOROTHIAZIDE 20-12.5 MG PO TABS: 1.0000 | ORAL_TABLET | Freq: Every day | ORAL | 3 refills | Status: DC

## 2016-05-25 MED ORDER — INSULIN ASPART 100 UNIT/ML ~~LOC~~ SOLN
3.0000 [IU] | Freq: Three times a day (TID) | SUBCUTANEOUS | 3 refills | Status: DC | PRN
Start: 2016-05-25 — End: 2016-06-15

## 2016-05-25 MED FILL — FERROUS SULFATE 325 MG TAB: 325 (65 FE) | 30 days supply | Qty: 60 | Fill #0

## 2016-05-25 MED FILL — GABAPENTIN 300 MG CAPSULE: 300 | 30 days supply | Qty: 90 | Fill #0

## 2016-05-25 MED FILL — ?METFORMIN HCL 1,000 MG TAB: 1000 | 30 days supply | Qty: 60 | Fill #0

## 2016-05-25 MED FILL — !NOVOLOG 100UNITS/ML VIAL: 100/ML | 28 days supply | Qty: 10 | Fill #0

## 2016-05-25 MED FILL — LISINOPRIL-HCTZ 20-12.5 MG: 20-12.5 | 30 days supply | Qty: 30 | Fill #0

## 2016-05-25 MED FILL — FLUoxetine HCL 40 MG CAPS: 40 | 30 days supply | Qty: 30 | Fill #0

## 2016-05-25 MED FILL — SIMVASTATIN 10 MG TABLET: 10 | 30 days supply | Qty: 30 | Fill #0

## 2016-05-25 NOTE — BH Specialist Note (Signed)
Integrated Behavioral Health Initial Visit  MRN: [id number] Name: Mariah Miller   Session Start time: 3:45 PM Session End time: 4:15 PM Total time: 30 minutes  Type of Service: Integrated Behavioral Health- Individual/Family Interpretor:No. Interpretor Name and Language: N/A   Warm Hand Off Completed.       SUBJECTIVE: Mariah Miller is a 41 y.o. female accompanied by patient. Patient was referred by Dr. Hyman HopesJegede for depression and anxiety. Patient reports the following symptoms/concerns: overwhelming feelings of sadness and worry, racing thoughts, irritability, crying episodes, difficulty sleeping, low energy/motivation, difficulty concentrating, irritability, and passive suicidal ideations  Duration of problem: Pt reports diagnosis of depression in 2011; Severity of problem: severe  OBJECTIVE: Mood: Depressed and Affect: Tearful Risk of harm to self or others: Suicidal ideation No plan to harm self or others   LIFE CONTEXT: Family and Social: Pt has stable housing. She has adult children, in addition, to an 48eleven yo son who resides with his father. Pt's family resides in [place] and provides emotional support School/Work: Pt is employed; however, does not have any medical insurance (ended December 2017) Self-Care: Pt is open to medication management to cope with stressors. She visits with family often  Life Changes: Pt is grieving the losses of loved ones (nephew, great-nephew, and cousins) who passed unexpectantly. She recently learned of mother's decline in health.  GOALS ADDRESSED: Patient will reduce symptoms of: anxiety and depression and increase knowledge and/or ability of: coping skills and also: Increase adequate support systems for patient/family and Begin healthy grieving over loss   INTERVENTIONS: Solution-Focused Strategies, Supportive Counseling and Psychoeducation and/or Health Education  Standardized Assessments completed: GAD-7, PHQ 2 and PHQ  9  ASSESSMENT: Patient currently experiencing depression and anxiety triggered by the loss of loved ones and recent information of mother's declined health. Pt reports overwhelming feelings of sadness and worry, racing thoughts, irritability, crying episodes, difficulty sleeping, low energy/motivation, difficulty concentrating, irritability, and passive suicidal ideations. Patient may benefit from psycho education, psychotherapy, and medication management. LCSWA educated pt on the stages of grief and how it can have a negative impact on one's mental and physical health. LCSWA discussed benefits of applying healthy coping skills to decrease symptoms, in addition, to participating in grief counseling and support. Pt identified healthy strategies to utilize on a weekly basis and is open to medication management. LCSWA inquired about protective factors and discussed safety plan. Pt was provided community resources for crisis intervention, psychotherapy, and medication management.   PLAN: 1. Follow up with behavioral health clinician on : Pt was encouraged to contact LCSWA if symptoms worsen or fail to improve to schedule [facility]. 2. Behavioral recommendations: LCSWA recommends that pt apply healthy coping skills discussed, comply with medication management, initiate grief support services, and utilize resources provided. Pt is encouraged to schedule follow up appointment with LCSWA 3. Referral(s): Community Mental Health Services (LME/Outside Clinic) 4. "From scale of 1-10, how likely are you to follow plan?": 8/10  Bridgett LarssonJasmine D Lewis, LCSW 05/26/16 3:25 PM

## 2016-05-25 NOTE — Progress Notes (Signed)
Subjective:  Patient ID: Mariah Miller, female    DOB: Sep 09, 1975  Age: 41 y.o. MRN: [id number]  CC: Gynecologic Exam and Diabetes   HPI Mariah Miller presents for f/u for hypertension, type 2 dm, uncontrolled with a ha1c of 12.2%, dyslipidemia, and morbid obesity. She is here today for pap smear, and medication management. She checks her sugars at home 2-4 times a day and sees fasting sugars of 150-285. She has tried dietary changes and is surprised that her ha1c is more elevated. She does not exercise. She currently takes sliding scale novolog, lantus, and metformin. She has had numbness and tingling in her hands and feet but feels it is controlled with gabapentin. She denies hypoglycemic events.  She has a hx of hypertension. She takes lisinopril-hctz. She does not check her pressures at home. She does not exercise and feels working in her job on 3rd shift is very stressful. She also has considerable stress at home and feels depressed and hopeless. She is tearful today describing health changes with her mother who is her support system. She has previously had counseling and taken prozac. She is interested in restarting medication.  She has a hx of migraine headaches. She feels she is having them 1-2 times a week. She sleeps to improve symptoms, warm compresses, or turns lights off. She has not seen neurology recently but when she did it was for seizure activity. She has not taken medication for her migraines that she is aware of.  She also complains of right hand pain that started and worsened over the past several weeks. 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 Her manager sent her home for the night which made her concerned for her future with the company. She seeks care for that problem today. She has tried massage but feels the problem persists.  She is due for pap smear today. She had  an abnormal pap smear 03/2014 that showed ASCUS with positive high risk hpv. She had a colposcopy 06/06/14. It was recommended that she have cotest in 12 months. Patient did not f/u. She does not perform regular breast exams.    Past Medical History:  Diagnosis Date  . Diabetes mellitus without complication ([place])   . Hypertension   . Migraine   . Seizures ([place])    self reported    Past Surgical History:  Procedure Laterality Date  . CHOLECYSTECTOMY    . CYST EXCISION    . SHOULDER OPEN ROTATOR CUFF REPAIR Left   . TONSILLECTOMY    . TUBAL LIGATION      Family History  Problem Relation Age of Onset  . Hypertension Mother   . Thyroid disease Mother   . Irregular heart beat Mother   . Bipolar disorder Sister     Social History  Substance Use Topics  . Smoking status: Former Research scientist (life sciences)  . Smokeless tobacco: Never Used     Comment: Smoked for 4 years, quit age 52  . Alcohol use 0.0 oz/week     Comment: Holidays only    ROS Review of Systems  Constitutional: Negative.   HENT: Negative.   Eyes: Negative.   Respiratory: Negative.   Cardiovascular: Negative.   Gastrointestinal: Negative.   Endocrine: Negative.   Genitourinary: Negative.   Musculoskeletal: Positive for arthralgias and joint swelling.  Skin: Negative.   Neurological: Positive for numbness and headaches.  Psychiatric/Behavioral:       Depressed mood    Objective:   Today's Vitals: BP 131/82 (BP Location: Right Arm, Patient Position: Sitting, Cuff Size: Large)   Pulse (!) 111   Temp 98.7 F (37.1 C) (Oral)   Resp 18   Ht '5\' 5"'$  (1.651 m)   Wt 206 lb 3.2 oz (93.5 kg)   LMP 05/02/2016   SpO2 100%   BMI 34.31 kg/m   Physical Exam  Constitutional: She is oriented to person, place, and time.  obese  HENT:  Head: Normocephalic and atraumatic.  Neck: Normal range of motion. No JVD present. No thyromegaly present.  Cardiovascular: Normal rate, regular rhythm and normal heart sounds.   Pulmonary/Chest:  Effort normal and breath sounds normal.  Abdominal: Soft. Bowel sounds are normal. She exhibits no distension. There is no tenderness.  Genitourinary: No breast swelling, tenderness or discharge. There is no rash, tenderness or lesion on the right labia. There is no rash, tenderness or lesion on the left labia. Uterus is not tender. Cervix exhibits no motion tenderness, no discharge and no friability. Right adnexum displays no tenderness. Left adnexum displays no tenderness. No erythema or tenderness in the vagina. No vaginal discharge found.  Musculoskeletal: Normal range of motion.       Right wrist: She exhibits tenderness and swelling.       Right hand: She exhibits tenderness. Decreased strength noted.  Neurological: She is alert and oriented to person, place, and time.  Skin: Skin is warm and dry.  Psychiatric:  tearful   CBC Latest Ref Rng & Units 03/09/2016 08/29/2015 04/29/2015  WBC 4.0 - 10.5 K/uL 7.4 - 6.6  Hemoglobin 12.0 - 15.0 g/dL 12.2 11.9(L) 10.9(L)  Hematocrit 36.0 - 46.0 % 36.4 35.0(L) 33.6(L)  Platelets 150 - 400 K/uL 341 - 314   CMP Latest Ref Rng & Units 03/09/2016 08/29/2015 04/29/2015  Glucose 65 - 99 mg/dL 362(H) 182(H) 140(H)  BUN 6 - 20 mg/dL '8 6 13  '$ Creatinine 0.44 - 1.00 mg/dL 0.87 0.70 0.93  Sodium 135 - 145 mmol/L 134(L) 139 136  Potassium 3.5 - 5.1 mmol/L 4.3 3.7 4.3  Chloride 101 - 111 mmol/L 104 105 106  CO2 22 - 32 mmol/L 22 - 25  Calcium 8.9 - 10.3 mg/dL 9.3 - 8.5(L)  Total Protein 6.5 - 8.1 g/dL 6.9 - -  Total Bilirubin 0.3 - 1.2 mg/dL 0.4 - -  Alkaline Phos 38 - 126 U/L 65 - -  AST 15 - 41 U/L 14(L) - -  ALT 14 - 54 U/L 12(L) - -   Lipid Panel     Component Value Date/Time   CHOL 169 03/17/2014 1029   TRIG 98 03/17/2014 1029   HDL 44 03/17/2014 1029   CHOLHDL 3.8 03/17/2014 1029   VLDL 20 03/17/2014 1029   LDLCALC 105 (H) 03/17/2014 1029    Assessment & Plan:  Types 2 Diabetes Mellitus- Uncontrolled, insulin dependent- We will increase her  lantus to 30units at bedtime and continue other medications - poct a1c - glucose (cbg) -microalbumin/creatinine ratio, urine - ambulatory referral to podiatry - ambulatory referral to opthamology - hypoglycemia precautions advised - diabetic diet counseling and education Aim for 30 minutes of exercise most days. Rethink what you drink. Water is great! Aim for 2-3 Carb Choices per meal (30-45 grams) +/- 1 either way  Aim for 0-15 Carbs per snack if hungry  Include protein in moderation with your meals and snacks  Consider reading food  labels for Total Carbohydrate and Fat Grams of foods  Consider checking BG at alternate times per day  Continue taking medication as directed Be mindful about how much sugar you are adding to beverages and other foods. Fruit Punch - find one with no sugar  Measure and decrease portions of carbohydrate foods  Make your plate and don't go back for seconds  Hypertension- We have discussed target BP range and blood pressure goal. I have advised patient to check BP regularly and to call us back or report to clinic if the numbers are consistently higher than 140/90. We discussed the importance of compliance with medical therapy and DASH diet recommended, consequences of uncontrolled hypertension discussed.  - continue current BP medications - cbc  - cmet Patient have been counseled extensively about nutrition and exercise  Dyslipidemia- To address this please limit saturated fat to no more than 7% of your calories, limit cholesterol to 200 mg/day, increase fiber and exercise as tolerated. If needed we may add another cholesterol lowering medication to your regimen.   Carpal Tunnel- She exhibited numbness, inflammation, and the medial nerve was irritated during examination. We will try anti-inflammatories and wrist splints during sleep to see if this improves her symptoms. Patient encouraged to return if symptoms persist.  - right wrist splint during  sleep  Migraine- Encouraged to sleep regularly, drink plenty of water, and use otc migraine medications. We will consider that this is likely related to stress and anxiety. If her symptoms do not improve or headache changes we will consider referral to neurology.   Depression- She is quite tearful and depressed today. We will restart prozac which she has taken previously and have her meet with social work. She was educated that ssris can cause suicidality and encouraged to go to contact us immediately or go to the ER if this occurs as we will need to change her medication.  - prozac   Iron deficiency anemia- we will repeat labs today and refill her medications  ASCUS & HPV/Pap Smear- pap smear performed today. Will send cytology and STI testing.   Health Maintenance- Breast exam performed, encouraged self-exams. No masses felt on exam.   No problem-specific Assessment & Plan notes found for this encounter.   Outpatient Encounter Prescriptions as of 05/25/2016  Medication Sig  . aspirin EC 81 MG tablet Take 1 tablet (81 mg total) by mouth daily.  . Blood Glucose Monitoring Suppl (TRUE METRIX METER) w/Device KIT USE AS DIRECTED  . ferrous sulfate 325 (65 FE) MG tablet Take 1 tablet (325 mg total) by mouth 2 (two) times daily with a meal.  . gabapentin (NEURONTIN) 300 MG capsule Take 1 capsule (300 mg total) by mouth 3 (three) times daily.  Marland Kitchen glucose blood (TRUE METRIX BLOOD GLUCOSE TEST) test strip Use as instructed  . insulin aspart (NOVOLOG) 100 UNIT/ML injection Inject 3-10 Units into the skin 3 (three) times daily as needed for high blood sugar. 150 - 200 = 3 units, 201 - 250 = 5 units, 251 - 300 = 7 units 301 - 350 = 10 units, > 350, call or come to clinic  . Insulin Glargine (LANTUS SOLOSTAR) 100 UNIT/ML Solostar Pen Inject 24 Units into the skin daily.  Marland Kitchen lisinopril-hydrochlorothiazide (PRINZIDE,ZESTORETIC) 20-12.5 MG tablet Take 1 tablet by mouth daily.  . metFORMIN (GLUCOPHAGE) 1000  MG tablet Take 1 tablet (1,000 mg total) by mouth 2 (two) times daily with a meal.  . simvastatin (ZOCOR) 10 MG tablet Take 1 tablet (  10 mg total) by mouth at bedtime.  . TRUEPLUS LANCETS 28G MISC USE AS INSTRUCTED   No facility-administered encounter medications on file as of 05/25/2016.     Beckey Rutter, AGNP Student  Evaluation and management procedures were performed by me with DNP Student in attendance, note written by DNP student under my supervision and collaboration. I have reviewed the note and I agree with the management and plan.  Data Review Lab Results  Component Value Date   HGBA1C 12.2 05/25/2016   HGBA1C 11.7 09/24/2015   HGBA1C 11.4 04/30/2015   Assessment & Plan   1. Uncontrolled diabetes mellitus type 2 without complications, unspecified long term insulin use status (HCC)  - POCT A1C - Glucose (CBG) - metFORMIN (GLUCOPHAGE) 1000 MG tablet; Take 1 tablet (1,000 mg total) by mouth 2 (two) times daily with a meal.  Dispense: 180 tablet; Refill: 3 - Insulin Glargine (LANTUS SOLOSTAR) 100 UNIT/ML Solostar Pen; Inject 30 Units into the skin daily.  Dispense: 5 pen; Refill: 3 - insulin aspart (NOVOLOG) 100 UNIT/ML injection; Inject 3-10 Units into the skin 3 (three) times daily as needed for high blood sugar. 150 - 200 = 3 units, 201 - 250 = 5 units, 251 - 300 = 7 units 301 - 350 = 10 units, > 350, call or come to clinic  Dispense: 10 mL; Refill: 3 - gabapentin (NEURONTIN) 300 MG capsule; Take 1 capsule (300 mg total) by mouth 3 (three) times daily.  Dispense: 270 capsule; Refill: 3 - COMPLETE METABOLIC PANEL WITH GFR - TSH - Urinalysis, Complete - Ambulatory referral to Ophthalmology - Ambulatory referral to Podiatry  2. Essential hypertension  - lisinopril-hydrochlorothiazide (PRINZIDE,ZESTORETIC) 20-12.5 MG tablet; Take 1 tablet by mouth daily.  Dispense: 90 tablet; Refill: 3  3. Dyslipidemia  - simvastatin (ZOCOR) 10 MG tablet; Take 1 tablet (10 mg total) by  mouth at bedtime.  Dispense: 90 tablet; Refill: 3 - Lipid panel  4. Type 2 diabetes mellitus with diabetic polyneuropathy, with long-term current use of insulin (HCC)  - metFORMIN (GLUCOPHAGE) 1000 MG tablet; Take 1 tablet (1,000 mg total) by mouth 2 (two) times daily with a meal.  Dispense: 180 tablet; Refill: 3 - Insulin Glargine (LANTUS SOLOSTAR) 100 UNIT/ML Solostar Pen; Inject 30 Units into the skin daily.  Dispense: 5 pen; Refill: 3 - insulin aspart (NOVOLOG) 100 UNIT/ML injection; Inject 3-10 Units into the skin 3 (three) times daily as needed for high blood sugar. 150 - 200 = 3 units, 201 - 250 = 5 units, 251 - 300 = 7 units 301 - 350 = 10 units, > 350, call or come to clinic  Dispense: 10 mL; Refill: 3 - gabapentin (NEURONTIN) 300 MG capsule; Take 1 capsule (300 mg total) by mouth 3 (three) times daily.  Dispense: 270 capsule; Refill: 3  5. Iron deficiency anemia, unspecified iron deficiency anemia type  - ferrous sulfate 325 (65 FE) MG tablet; Take 1 tablet (325 mg total) by mouth 2 (two) times daily with a meal.  Dispense: 180 tablet; Refill: 3 - CBC with Differential/Platelet  6. Pap smear abnormality of cervix with ASCUS favoring benign  - Cytology - PAP - Cervicovaginal ancillary only - HIV antibody (with reflex)  7. Moderate episode of recurrent major depressive disorder (HCC)  - FLUoxetine (PROZAC) 40 MG capsule; Take 1 capsule (40 mg total) by mouth daily.  Dispense: 30 capsule; Refill: 3 Referred to LCSW today for counseling and resource management  Patient have been counseled extensively about nutrition  and exercise. Other issues discussed during this visit include: low cholesterol diet, weight control and daily exercise, foot care, annual eye examinations at Ophthalmology, importance of adherence with medications and regular follow-up. We also discussed long term complications of uncontrolled diabetes and hypertension.   Return in about 3 months (around 08/25/2016) for  Follow up HTN, Hemoglobin A1C and Follow up, DM, Follow up Pain and comorbidities.  The patient was given clear instructions to go to ER or return to medical center if symptoms don't improve, worsen or new problems develop. The patient verbalized understanding. The patient was told to call to get lab results if they haven't heard anything in the next week.   This note has been created with Surveyor, quantity. Any transcriptional errors are unintentional.    Angelica Chessman, MD, [place], Karilyn Cota, [place] [facility] [place], [place]   05/28/2016, 5:55 PM

## 2016-05-25 NOTE — Patient Instructions (Signed)
Diabetes and Foot Care Diabetes may cause you to have problems because of poor blood supply (circulation) to your feet and legs. This may cause the skin on your feet to become thinner, break easier, and heal more slowly. Your skin may become dry, and the skin may peel and crack. You may also have nerve damage in your legs and feet causing decreased feeling in them. You may not notice minor injuries to your feet that could lead to infections or more serious problems. Taking care of your feet is one of the most important things you can do for yourself. Follow these instructions at home:  Wear shoes at all times, even in the house. Do not go barefoot. Bare feet are easily injured.  Check your feet daily for blisters, cuts, and redness. If you cannot see the bottom of your feet, use a mirror or ask someone for help.  Wash your feet with warm water (do not use hot water) and mild soap. Then pat your feet and the areas between your toes until they are completely dry. Do not soak your feet as this can dry your skin.  Apply a moisturizing lotion or petroleum jelly (that does not contain alcohol and is unscented) to the skin on your feet and to dry, brittle toenails. Do not apply lotion between your toes.  Trim your toenails straight across. Do not dig under them or around the cuticle. File the edges of your nails with an emery board or nail file.  Do not cut corns or calluses or try to remove them with medicine.  Wear clean socks or stockings every day. Make sure they are not too tight. Do not wear knee-high stockings since they may decrease blood flow to your legs.  Wear shoes that fit properly and have enough cushioning. To break in new shoes, wear them for just a few hours a day. This prevents you from injuring your feet. Always look in your shoes before you put them on to be sure there are no objects inside.  Do not cross your legs. This may decrease the blood flow to your feet.  If you find a  minor scrape, cut, or break in the skin on your feet, keep it and the skin around it clean and dry. These areas may be cleansed with mild soap and water. Do not cleanse the area with peroxide, alcohol, or iodine.  When you remove an adhesive bandage, be sure not to damage the skin around it.  If you have a wound, look at it several times a day to make sure it is healing.  Do not use heating pads or hot water bottles. They may burn your skin. If you have lost feeling in your feet or legs, you may not know it is happening until it is too late.  Make sure your health care provider performs a complete foot exam at least annually or more often if you have foot problems. Report any cuts, sores, or bruises to your health care provider immediately. Contact a health care provider if:  You have an injury that is not healing.  You have cuts or breaks in the skin.  You have an ingrown nail.  You notice redness on your legs or feet.  You feel burning or tingling in your legs or feet.  You have pain or cramps in your legs and feet.  Your legs or feet are numb.  Your feet always feel cold. Get help right away if:  There is increasing   redness, swelling, or pain in or around a wound.  There is a red line that goes up your leg.  Pus is coming from a wound.  You develop a fever or as directed by your health care provider.  You notice a bad smell coming from an ulcer or wound. This information is not intended to replace advice given to you by your health care provider. Make sure you discuss any questions you have with your health care provider. Document Released: 02/19/2000 Document Revised: 07/30/2015 Document Reviewed: 07/31/2012 Elsevier Interactive Patient Education  2017 Elsevier Inc. Blood Glucose Monitoring, Adult Monitoring your blood sugar (glucose) helps you manage your diabetes. It also helps you and your health care provider determine how well your diabetes management plan is  working. Blood glucose monitoring involves checking your blood glucose as often as directed, and keeping a record (log) of your results over time. Why should I monitor my blood glucose? Checking your blood glucose regularly can:  Help you understand how food, exercise, illnesses, and medicines affect your blood glucose.  Let you know what your blood glucose is at any time. You can quickly tell if you are having low blood glucose (hypoglycemia) or high blood glucose (hyperglycemia).  Help you and your health care provider adjust your medicines as needed. When should I check my blood glucose? Follow instructions from your health care provider about how often to check your blood glucose. This may depend on:  The type of diabetes you have.  How well-controlled your diabetes is.  Medicines you are taking. If you have type 1 diabetes:  Check your blood glucose at least 2 times a day.  Also check your blood glucose:  Before every insulin injection.  Before and after exercise.  Between meals.  2 hours after a meal.  Occasionally between 2:00 a.m. and 3:00 a.m., as directed.  Before potentially dangerous tasks, like driving or using heavy machinery.  At bedtime.  You may need to check your blood glucose more often, up to 6-10 times a day:  If you use an insulin pump.  If you need multiple daily injections (MDI).  If your diabetes is not well-controlled.  If you are ill.  If you have a history of severe hypoglycemia.  If you have a history of not knowing when your blood glucose is getting low (hypoglycemia unawareness). If you have type 2 diabetes:  If you take insulin or other diabetes medicines, check your blood glucose at least 2 times a day.  If you are on intensive insulin therapy, check your blood glucose at least 4 times a day. Occasionally, you may also need to check between 2:00 a.m. and 3:00 a.m., as directed.  Also check your blood glucose:  Before and after  exercise.  Before potentially dangerous tasks, like driving or using heavy machinery.  You may need to check your blood glucose more often if:  Your medicine is being adjusted.  Your diabetes is not well-controlled.  You are ill. What is a blood glucose log?  A blood glucose log is a record of your blood glucose readings. It helps you and your health care provider:  Look for patterns in your blood glucose over time.  Adjust your diabetes management plan as needed.  Every time you check your blood glucose, write down your result and notes about things that may be affecting your blood glucose, such as your diet and exercise for the day.  Most glucose meters store a record of glucose readings in   the meter. Some meters allow you to download your records to a computer. How do I check my blood glucose? Follow these steps to get accurate readings of your blood glucose: Supplies needed   Blood glucose meter.  Test strips for your meter. Each meter has its own strips. You must use the strips that come with your meter.  A needle to prick your finger (lancet). Do not use lancets more than once.  A device that holds the lancet (lancing device).  A journal or log book to write down your results. Procedure  Wash your hands with soap and water.  Prick the side of your finger (not the tip) with the lancet. Use a different finger each time.  Gently rub the finger until a small drop of blood appears.  Follow instructions that come with your meter for inserting the test strip, applying blood to the strip, and using your blood glucose meter.  Write down your result and any notes. Alternative testing sites  Some meters allow you to use areas of your body other than your finger (alternative sites) to test your blood.  If you think you may have hypoglycemia, or if you have hypoglycemia unawareness, do not use alternative sites. Use your finger instead.  Alternative sites may not be as  accurate as the fingers, because blood flow is slower in these areas. This means that the result you get may be delayed, and it may be different from the result that you would get from your finger.  The most common alternative sites are:  Forearm.  Thigh.  Palm of the hand. Additional tips  Always keep your supplies with you.  If you have questions or need help, all blood glucose meters have a 24-hour "hotline" number that you can call. You may also contact your health care provider.  After you use a few boxes of test strips, adjust (calibrate) your blood glucose meter by following instructions that came with your meter. This information is not intended to replace advice given to you by your health care provider. Make sure you discuss any questions you have with your health care provider. Document Released: 02/24/2003 Document Revised: 09/11/2015 Document Reviewed: 08/03/2015 Elsevier Interactive Patient Education  2017 Elsevier Inc.  

## 2016-05-25 NOTE — Progress Notes (Signed)
Patient is here for PAP and DM  Patient complains of right hand being numb and "locking up" on her while at work. Patient was sent home after patients hand relaxed and released the work she was attempting to complete.  Patient request refills on all medications.  Patient has not taken medication today. Patient has eaten today.

## 2016-05-26 DIAGNOSIS — F32A Depression, unspecified: Secondary | ICD-10-CM | POA: Insufficient documentation

## 2016-05-26 DIAGNOSIS — F329 Major depressive disorder, single episode, unspecified: Secondary | ICD-10-CM | POA: Insufficient documentation

## 2016-05-26 DIAGNOSIS — F419 Anxiety disorder, unspecified: Principal | ICD-10-CM

## 2016-05-26 LAB — COMPLETE METABOLIC PANEL WITH GFR
ALT: 11 U/L (ref 6–29)
AST: 12 U/L (ref 10–30)
Albumin: 4.1 g/dL (ref 3.6–5.1)
Alkaline Phosphatase: 62 U/L (ref 33–115)
BUN: 15 mg/dL (ref 7–25)
CO2: 20 mmol/L (ref 20–31)
Calcium: 9.6 mg/dL (ref 8.6–10.2)
Chloride: 105 mmol/L (ref 98–110)
Creat: 0.81 mg/dL (ref 0.50–1.10)
GFR, Est African American: >89 mL/min (ref >=60)
GLUCOSE: 125 mg/dL — AB (ref 65–99)
POTASSIUM: 3.7 mmol/L (ref 3.5–5.3)
Sodium: 140 mmol/L (ref 135–146)
TOTAL PROTEIN: 7.2 g/dL (ref 6.1–8.1)
Total Bilirubin: 0.3 mg/dL (ref 0.2–1.2)

## 2016-05-26 LAB — URINALYSIS, COMPLETE: YEAST: NONE SEEN [HPF]

## 2016-05-26 LAB — LIPID PANEL
CHOL/HDL RATIO: 4.7 ratio (ref <5.0)
Cholesterol: 193 mg/dL (ref <200)
HDL: 41 mg/dL — AB (ref >50)
LDL CALC: 115 mg/dL — AB (ref <100)
Triglycerides: 183 mg/dL — ABNORMAL HIGH (ref <150)
VLDL: 37 mg/dL — ABNORMAL HIGH (ref <30)

## 2016-05-26 MED FILL — !LANTUS SOLOSTAR 100UNITS/M: 100 | 10 days supply | Qty: 3 | Fill #0

## 2016-05-27 LAB — CERVICOVAGINAL ANCILLARY ONLY
BACTERIAL VAGINITIS: POSITIVE — AB
Candida vaginitis: NEGATIVE
Chlamydia: NEGATIVE
Neisseria Gonorrhea: NEGATIVE
TRICH (WINDOWPATH): POSITIVE — AB

## 2016-05-28 LAB — HIV ANTIBODY (ROUTINE TESTING W REFLEX): HIV: NONREACTIVE

## 2016-05-31 ENCOUNTER — Other Ambulatory Visit: Payer: Self-pay | Admitting: Internal Medicine

## 2016-05-31 MED ORDER — METRONIDAZOLE 500 MG PO TABS: 500.0000 mg | ORAL_TABLET | Freq: Two times a day (BID) | ORAL | 0 refills | Status: DC

## 2016-05-31 MED FILL — ?METRONIDAZOLE 500 MG TABLE: 500 | 7 days supply | Qty: 14 | Fill #0

## 2016-06-01 LAB — CYTOLOGY - PAP
ADEQUACY: ABSENT
DIAGNOSIS: NEGATIVE

## 2016-06-02 ENCOUNTER — Telehealth: Payer: Self-pay | Admitting: *Deleted

## 2016-06-02 NOTE — Telephone Encounter (Signed)
-----   Message from Quentin Angstlugbemiga E Jegede, MD sent at 06/02/2016  2:00 PM EDT ----- Pap smear showed no evidence of malignancy

## 2016-06-02 NOTE — Telephone Encounter (Signed)
Patient verified DOB Patient is aware of pap smear being negative for malignancy. Patient is aware of labs being normal except for cholesterol being elevated and needing to take medication as prescribed and implement nutritional changes. Patient is aware of swab being positive for Trich and an antibiotic being sent to the pharmacy for pickup. Patient is advised to have partner treated and to refrain from sex and alcohol and treatment is completed. Patient requested a printed copy and mychart access. No further questions at this time.

## 2016-06-06 ENCOUNTER — Other Ambulatory Visit: Payer: Self-pay | Admitting: Internal Medicine

## 2016-06-06 ENCOUNTER — Telehealth: Payer: Self-pay | Admitting: Internal Medicine

## 2016-06-06 NOTE — Telephone Encounter (Signed)
Patient came by the office to drop off FMLA documents for PCP to fill out. Placing in PCP 's mailbox. Documents needs to be faxed within the next 10 days.  Attention to May Thompson fax: [phone].   Thank you.

## 2016-06-15 ENCOUNTER — Ambulatory Visit: Payer: Self-pay | Attending: Internal Medicine | Admitting: Internal Medicine

## 2016-06-15 ENCOUNTER — Encounter: Payer: Self-pay | Admitting: Internal Medicine

## 2016-06-15 ENCOUNTER — Telehealth: Payer: Self-pay | Admitting: Internal Medicine

## 2016-06-15 DIAGNOSIS — I10 Essential (primary) hypertension: Secondary | ICD-10-CM | POA: Insufficient documentation

## 2016-06-15 DIAGNOSIS — Z79899 Other long term (current) drug therapy: Secondary | ICD-10-CM | POA: Insufficient documentation

## 2016-06-15 DIAGNOSIS — Z7982 Long term (current) use of aspirin: Secondary | ICD-10-CM | POA: Insufficient documentation

## 2016-06-15 DIAGNOSIS — Z794 Long term (current) use of insulin: Secondary | ICD-10-CM | POA: Insufficient documentation

## 2016-06-15 DIAGNOSIS — E1142 Type 2 diabetes mellitus with diabetic polyneuropathy: Secondary | ICD-10-CM

## 2016-06-15 DIAGNOSIS — E119 Type 2 diabetes mellitus without complications: Secondary | ICD-10-CM | POA: Insufficient documentation

## 2016-06-15 DIAGNOSIS — IMO0001 Reserved for inherently not codable concepts without codable children: Secondary | ICD-10-CM

## 2016-06-15 DIAGNOSIS — E1165 Type 2 diabetes mellitus with hyperglycemia: Secondary | ICD-10-CM

## 2016-06-15 MED ORDER — INSULIN ASPART 100 UNIT/ML ~~LOC~~ SOLN: 7.0000 [IU] | Freq: Three times a day (TID) | SUBCUTANEOUS | 3 refills | Status: DC | PRN

## 2016-06-15 NOTE — Progress Notes (Signed)
Mariah Miller, is a 41 y.o. female  TFT:[id number]  HCW:[id number]  DOB - 12-19-1975  Chief Complaint  Patient presents with  . Diabetes       Subjective:   Mariah Miller is a 41 y.o. female here today for a follow up visit. Patient states she was having dry mouth,"couldn't make spit" on Sunday (4 days ago). She had near syncope episode at work, lightheaded and dizzy and was escorted off the floor. She felt hot and began to sweat. EMS was called and glucose level was 300. She was sent home from work that night. She gave herself the sliding scale dose for 350. Her blood sugar was 179 when she woke up on Monday morning. Last night at work again, her blood sugar was 456, and she was asked to go home as a result of repeating symptoms. Today patient administered Novolog 5u  At [phone]. Her blood sugar is 289 while on the phone @ 1041. She denies lightheaded or dizziness, N/V. States she is very thirsty and having to urinate a lot. Pt advised to come to office for proper evaluation. She has no new complaint at the moment, saying she feels much better. She denies any blurry vision or dizziness at this time. When asked if her job is stressful, she answered in the affirmative saying: "I stand on my feet all night all the time at work". She denies any fever. She denies being depressed, she denies suicidal ideation or thoughts. Patient has No headache, No chest pain, No abdominal pain - No Nausea, No new weakness tingling or numbness, No Cough - SOB.  No problems updated.  ALLERGIES: Allergies  Allergen Reactions  . Tomato Itching and Rash    PAST MEDICAL HISTORY: Past Medical History:  Diagnosis Date  . Diabetes mellitus without complication ([place])   . Hypertension   . Migraine   . Seizures ([place])    self reported    MEDICATIONS AT HOME: Prior to Admission medications   Medication Sig Start Date End Date Taking? Authorizing Provider  aspirin EC 81 MG tablet Take 1 tablet (81 mg total)  by mouth daily. 01/22/13  Yes Nishant Dhungel, MD  Blood Glucose Monitoring Suppl (TRUE METRIX METER) w/Device KIT USE AS DIRECTED 05/01/15  Yes Lavance Beazer E Doreene Burke, MD  ferrous sulfate 325 (65 FE) MG tablet Take 1 tablet (325 mg total) by mouth 2 (two) times daily with a meal. 05/25/16  Yes Radley Barto E Doreene Burke, MD  FLUoxetine (PROZAC) 40 MG capsule Take 1 capsule (40 mg total) by mouth daily. 05/25/16  Yes Tresa Garter, MD  gabapentin (NEURONTIN) 300 MG capsule Take 1 capsule (300 mg total) by mouth 3 (three) times daily. 05/25/16  Yes Tresa Garter, MD  glucose blood (TRUE METRIX BLOOD GLUCOSE TEST) test strip Use as instructed 10/08/15  Yes Lashell Moffitt E Doreene Burke, MD  insulin aspart (NOVOLOG) 100 UNIT/ML injection Inject 7-12 Units into the skin 3 (three) times daily as needed for high blood sugar. 301 - 350 = 12 units, > 350, call or come to clinic 06/15/16  Yes Odile Veloso E Doreene Burke, MD  Insulin Glargine (LANTUS SOLOSTAR) 100 UNIT/ML Solostar Pen Inject 30 Units into the skin daily. 05/25/16  Yes Tresa Garter, MD  lisinopril-hydrochlorothiazide (PRINZIDE,ZESTORETIC) 20-12.5 MG tablet Take 1 tablet by mouth daily. 05/25/16  Yes Tresa Garter, MD  metFORMIN (GLUCOPHAGE) 1000 MG tablet Take 1 tablet (1,000 mg total) by mouth 2 (two) times daily with a meal. 05/25/16  Yes Alyn Jurney E  Doreene Burke, MD  simvastatin (ZOCOR) 10 MG tablet Take 1 tablet (10 mg total) by mouth at bedtime. 05/25/16  Yes Tresa Garter, MD  TRUEPLUS LANCETS 28G MISC USE AS INSTRUCTED 05/01/15  Yes Tresa Garter, MD    Objective:   Vitals:   06/15/16 1150  BP: (!) 143/88  Pulse: 93  Resp: 18  Temp: 98.8 F (37.1 C)  TempSrc: Oral  SpO2: 100%  Weight: 204 lb 9.6 oz (92.8 kg)  Height: 5' 5" (1.651 m)   Exam General appearance : Awake, alert, not in any distress. Speech Clear. Not toxic looking,  obese HEENT: Atraumatic and Normocephalic, pupils equally reactive to light and accomodation Neck:  Supple, no JVD. No cervical lymphadenopathy.  Chest: Good air entry bilaterally, no added sounds  CVS: S1 S2 regular, no murmurs.  Abdomen: Bowel sounds present, Non tender and not distended with no gaurding, rigidity or rebound. Extremities: B/L Lower Ext shows no edema, both legs are warm to touch Neurology: Awake alert, and oriented X 3, CN II-XII intact, Non focal Skin: No Rash  Data Review Lab Results  Component Value Date   HGBA1C 12.2 05/25/2016   HGBA1C 11.7 09/24/2015   HGBA1C 11.4 04/30/2015    Assessment & Plan   Uncontrolled diabetes mellitus type 2   Increase sliding scale coverage as below, record blood sugar TID AC HS, bring the blood sugar log to clinic to further determine adjustment to long acting insulin coverage. Encouraged strict dietary control and exercise.   - insulin aspart (NOVOLOG) 100 UNIT/ML injection; Inject 7-12 Units into the skin 3 (three) times daily as needed for high blood sugar. 301 - 350 = 12 units, > 350, call or come to clinic  Dispense: 10 mL; Refill: 3  Patient have been counseled extensively about nutrition and exercise. Other issues discussed during this visit include: low cholesterol diet, weight control and daily exercise, foot care, annual eye examinations at Ophthalmology, importance of adherence with medications and regular follow-up. We also discussed long term complications of uncontrolled diabetes and hypertension.   Return in about 3 months (around 09/14/2016) for Hemoglobin A1C and Follow up, DM, Follow up HTN.  The patient was given clear instructions to go to ER or return to medical center if symptoms don't improve, worsen or new problems develop. The patient verbalized understanding. The patient was told to call to get lab results if they haven't heard anything in the next week.   This note has been created with Surveyor, quantity. Any transcriptional errors are unintentional.     Angelica Chessman, MD, [place], [place], [place], [place] and [place], [place]   06/15/2016, 12:13 PM

## 2016-06-15 NOTE — Telephone Encounter (Signed)
Patient called the office asking to speak with PCP in regards to high blood sugar. She has been experienced this since the weekend and believes that it may be from medication. Please follow up.

## 2016-06-15 NOTE — Telephone Encounter (Signed)
Pt states she was sick on Sunday, dry mouth,"couldn't make spit". She had near syncope episode, lightheaded and dizzy and was escorted off the floor. She felt hot and began to sweat. EMS was called and glucose level was 300. She was sent home from work that night.. She gave herself the sliding scale dose for 350. Her blood sugar was 179 when woke up on Monday morning.  On Monday she states she was lethargic. Last night d/t blood sugar of 456 patient was asked to go home d/t same sx's aforementioned.   Today pt administered Novolog 5u  At [phone]. Her blood sugar is 289 while on the phone @ 1041.    Pt denies lightheaded or dizziness, N/V. States she is very thirsty and having to urinate a lot.  Pt advised to come to office.

## 2016-06-15 NOTE — Progress Notes (Signed)
Patient is here for DM FU  Patient denies pain at this time.  Patient has taken medication today. Patient has eaten today.  Patient took 7 units of insulin at 10:40 due to her sugar level being 289.

## 2016-07-06 MED FILL — !LANTUS SOLOSTAR 100UNITS/M: 100 | 10 days supply | Qty: 3 | Fill #1

## 2016-07-07 MED FILL — !NOVOLOG 100UNITS/ML VIAL: 100/ML | 30 days supply | Qty: 10 | Fill #0

## 2016-08-24 ENCOUNTER — Ambulatory Visit: Payer: Self-pay | Admitting: Internal Medicine

## 2016-08-25 ENCOUNTER — Encounter (HOSPITAL_COMMUNITY): Payer: Self-pay

## 2016-08-25 ENCOUNTER — Emergency Department (HOSPITAL_COMMUNITY)
Admission: EM | Admit: 2016-08-25 | Discharge: 2016-08-25 | Disposition: A | Discharge status: Home / Self Care | Payer: Self-pay | Attending: Emergency Medicine | Admitting: Emergency Medicine

## 2016-08-25 DIAGNOSIS — Z794 Long term (current) use of insulin: Secondary | ICD-10-CM | POA: Insufficient documentation

## 2016-08-25 DIAGNOSIS — Z79899 Other long term (current) drug therapy: Secondary | ICD-10-CM | POA: Insufficient documentation

## 2016-08-25 DIAGNOSIS — E119 Type 2 diabetes mellitus without complications: Secondary | ICD-10-CM | POA: Insufficient documentation

## 2016-08-25 DIAGNOSIS — Z87891 Personal history of nicotine dependence: Secondary | ICD-10-CM | POA: Insufficient documentation

## 2016-08-25 DIAGNOSIS — R229 Localized swelling, mass and lump, unspecified: Secondary | ICD-10-CM

## 2016-08-25 DIAGNOSIS — D649 Anemia, unspecified: Secondary | ICD-10-CM | POA: Insufficient documentation

## 2016-08-25 DIAGNOSIS — I10 Essential (primary) hypertension: Secondary | ICD-10-CM | POA: Insufficient documentation

## 2016-08-25 NOTE — ED Provider Notes (Signed)
[facility] DEPT Provider Note   CSN: [id number] Arrival date & time: 08/25/16  1228  By signing my name below, I, Hansel Feinstein, attest that this documentation has been prepared under the direction and in the presence of [employer], PA-C. Electronically Signed: Hansel Feinstein, ED Scribe. 08/25/16. 1:09 PM.    History   Chief Complaint Chief Complaint  Patient presents with  . Skin Problem    HPI  Mariah Miller is a 41 y.o. female who presents to the Emergency Department complaining of a moderate, gradually worsening area of throbbing pain, itching and swelling to the posterior scalp that began 2 days ago. She denies recent injury, trauma or falls. Pt reports she has bug bites on her neck and arms, and is not sure if a bug bit her scalp. No other similar areas on the body, but she does report h/o abscess to her back. She denies fever.    The history is provided by the patient. No language interpreter was used.    Past Medical History:  Diagnosis Date  . Diabetes mellitus without complication ([place])   . Hypertension   . Migraine   . Seizures ([place])    self reported    Patient Active Problem List   Diagnosis Date Noted  . Anxiety and depression 05/26/2016  . OSA (obstructive sleep apnea) 04/30/2015  . Chest pain 04/28/2015  . DM2 (diabetes mellitus, type 2) ([place]) 04/28/2015  . ASCUS with positive high risk HPV cervical 06/06/2014  . Anemia, iron deficiency 03/17/2014  . Dyslipidemia 03/17/2014  . Essential hypertension 03/17/2014  . Uncontrolled diabetes mellitus ([place]) 06/17/2013  . Hypoglycemia 04/08/2013  . Diabetic neuropathy, painful ([place]) 03/21/2013  . Other and unspecified hyperlipidemia 01/22/2013  . A C DEGENERATION-CHRONIC 10/27/2008  . SHOULDER PAIN 05/01/2008  . IMPINGEMENT SYNDROME 05/01/2008    Past Surgical History:  Procedure Laterality Date  . CHOLECYSTECTOMY    . CYST EXCISION    . SHOULDER OPEN ROTATOR CUFF REPAIR Left   . TONSILLECTOMY      . TUBAL LIGATION      OB History    Gravida Para Term Preterm AB Living   _0 SAB TAB Ectopic Multiple Live Births   2       3       Home Medications    Prior to Admission medications   Medication Sig Start Date End Date Taking? Authorizing Provider  aspirin EC 81 MG tablet Take 1 tablet (81 mg total) by mouth daily. 01/22/13   Dhungel, Flonnie Overman, MD  Blood Glucose Monitoring Suppl (TRUE METRIX METER) w/Device KIT USE AS DIRECTED 05/01/15   Tresa Garter, MD  ferrous sulfate 325 (65 FE) MG tablet Take 1 tablet (325 mg total) by mouth 2 (two) times daily with a meal. 05/25/16   Jegede, Olugbemiga E, MD  FLUoxetine (PROZAC) 40 MG capsule Take 1 capsule (40 mg total) by mouth daily. 05/25/16   Tresa Garter, MD  gabapentin (NEURONTIN) 300 MG capsule Take 1 capsule (300 mg total) by mouth 3 (three) times daily. 05/25/16   Tresa Garter, MD  glucose blood (TRUE METRIX BLOOD GLUCOSE TEST) test strip Use as instructed 10/08/15   Angelica Chessman E, MD  insulin aspart (NOVOLOG) 100 UNIT/ML injection Inject 7-12 Units into the skin 3 (three) times daily as needed for high blood sugar. 301 - 350 = 12 units, > 350, call or come to clinic 06/15/16   Angelica Chessman  E, MD  Insulin Glargine (LANTUS SOLOSTAR) 100 UNIT/ML Solostar Pen Inject 30 Units into the skin daily. 05/25/16   Tresa Garter, MD  lisinopril-hydrochlorothiazide (PRINZIDE,ZESTORETIC) 20-12.5 MG tablet Take 1 tablet by mouth daily. 05/25/16   Tresa Garter, MD  metFORMIN (GLUCOPHAGE) 1000 MG tablet Take 1 tablet (1,000 mg total) by mouth 2 (two) times daily with a meal. 05/25/16   Jegede, Marlena Clipper, MD  simvastatin (ZOCOR) 10 MG tablet Take 1 tablet (10 mg total) by mouth at bedtime. 05/25/16   Tresa Garter, MD  TRUEPLUS LANCETS 28G MISC USE AS INSTRUCTED 05/01/15   Tresa Garter, MD    Family History Family History  Problem Relation Age of Onset  . Hypertension Mother   .  Thyroid disease Mother   . Irregular heart beat Mother   . Bipolar disorder Sister     Social History Social History  Substance Use Topics  . Smoking status: Former Research scientist (life sciences)  . Smokeless tobacco: Never Used     Comment: Smoked for 4 years, quit age 72  . Alcohol use 0.0 oz/week     Comment: Holidays only     Allergies   Tomato   Review of Systems Review of Systems  Constitutional: Negative for fever.  Skin: Positive for rash.    Physical Exam Updated Vital Signs BP (!) 157/94 (BP Location: Right Arm)   Pulse 77   Temp 98.4 F (36.9 C) (Oral)   Resp 16   Ht _0  (1.651 m)   Wt 92.5 kg (204 lb)   SpO2 100%   BMI 33.95 kg/m   Physical Exam  Constitutional: She appears well-developed and well-nourished.  HENT:  Head: Normocephalic.  Eyes: Conjunctivae are normal.  Cardiovascular: Normal rate.   Pulmonary/Chest: Effort normal. No respiratory distress.  Abdominal: She exhibits no distension.  Musculoskeletal: Normal range of motion.  Neurological: She is alert.  Skin: Skin is warm and dry.  Small nodule on the neck and posterior scalp; no redness, warmth, or fluctuance   Psychiatric: She has a normal mood and affect. Her behavior is normal.  Nursing note and vitals reviewed.    ED Treatments / Results   DIAGNOSTIC STUDIES: Oxygen Saturation is 100% on RA, normal by my interpretation.    COORDINATION OF CARE: 1:04 PM Discussed treatment plan with pt at bedside which includes supportive home care and pt agreed to plan.   Procedures Procedures (including critical care time)  Medications Ordered in ED Medications - No data to display   Initial Impression / Assessment and Plan / ED Course  I have reviewed the triage vital signs and the nursing notes.     Labs: none  Imaging: none  Consults: none  Therapeutics: none  Discharge Meds: none  Assessment/Plan: 41 year old female presents today with several nodules to her neck and posterior scalp.   These appear to be consistent with early cyst.  No signs of infectious etiology no need for other evaluation or management here.  Care instructions given return precautions given patient verbalized understanding and agreement to this plan and had no further questions or concerns.    Final Clinical Impressions(s) / ED Diagnoses   Final diagnoses:  Skin nodule    New Prescriptions Discharge Medication List as of 08/25/2016  1:25 PM     I personally performed the services described in this documentation, which was scribed in my presence. The recorded information has been reviewed and is accurate.   Okey Regal, PA-C 08/25/16 1713  Lajean Saver, MD 08/29/16 [id number]

## 2016-08-25 NOTE — Discharge Instructions (Signed)
Please read attached information. If you experience any new or worsening signs or symptoms please return to the emergency room for evaluation. Please follow-up with your primary care provider or specialist as discussed.  °

## 2016-08-25 NOTE — ED Triage Notes (Signed)
Pt has "knot" in back of head x 2 days that is painful to palpation and red. PT denies hitting head. She also has "bites" that she think are from mosquito on neck and right arm. She reports they are itchy

## 2016-09-02 ENCOUNTER — Ambulatory Visit: Payer: Self-pay | Attending: Internal Medicine

## 2016-09-13 ENCOUNTER — Other Ambulatory Visit: Payer: Self-pay | Admitting: *Deleted

## 2016-09-13 DIAGNOSIS — Z794 Long term (current) use of insulin: Principal | ICD-10-CM

## 2016-09-13 DIAGNOSIS — E1142 Type 2 diabetes mellitus with diabetic polyneuropathy: Secondary | ICD-10-CM

## 2016-09-13 MED ORDER — INSULIN LISPRO 100 UNIT/ML ~~LOC~~ SOLN: 7.0000 [IU] | Freq: Three times a day (TID) | SUBCUTANEOUS | 3 refills | Status: DC

## 2016-09-13 MED ORDER — INSULIN GLARGINE 100 UNIT/ML SOLOSTAR PEN: 30.0000 [IU] | PEN_INJECTOR | Freq: Every day | SUBCUTANEOUS | 3 refills | Status: DC

## 2016-09-13 NOTE — Telephone Encounter (Signed)
PRINTED FOR PASS PROGRAM 

## 2016-09-22 MED FILL — !LANTUS SOLOSTAR 100UNITS/M: 100 | 10 days supply | Qty: 3 | Fill #2

## 2016-09-22 MED FILL — !NOVOLOG 100UNITS/ML VIAL: 100/ML | 30 days supply | Qty: 10 | Fill #1

## 2016-09-28 ENCOUNTER — Encounter: Payer: Self-pay | Admitting: Internal Medicine

## 2016-09-28 ENCOUNTER — Ambulatory Visit: Payer: Self-pay | Attending: Internal Medicine | Admitting: Internal Medicine

## 2016-09-28 VITALS — BP 138/64 | HR 91 | Temp 98.1°F | Resp 18 | Ht 65.0 in | Wt 208.4 lb

## 2016-09-28 DIAGNOSIS — F331 Major depressive disorder, recurrent, moderate: Secondary | ICD-10-CM

## 2016-09-28 DIAGNOSIS — G4700 Insomnia, unspecified: Secondary | ICD-10-CM | POA: Insufficient documentation

## 2016-09-28 DIAGNOSIS — I10 Essential (primary) hypertension: Secondary | ICD-10-CM

## 2016-09-28 DIAGNOSIS — D509 Iron deficiency anemia, unspecified: Secondary | ICD-10-CM

## 2016-09-28 DIAGNOSIS — Z7982 Long term (current) use of aspirin: Secondary | ICD-10-CM | POA: Insufficient documentation

## 2016-09-28 DIAGNOSIS — E1165 Type 2 diabetes mellitus with hyperglycemia: Secondary | ICD-10-CM | POA: Insufficient documentation

## 2016-09-28 DIAGNOSIS — E785 Hyperlipidemia, unspecified: Secondary | ICD-10-CM

## 2016-09-28 DIAGNOSIS — F5101 Primary insomnia: Secondary | ICD-10-CM

## 2016-09-28 DIAGNOSIS — Z6834 Body mass index (BMI) 34.0-34.9, adult: Secondary | ICD-10-CM | POA: Insufficient documentation

## 2016-09-28 DIAGNOSIS — Z79899 Other long term (current) drug therapy: Secondary | ICD-10-CM | POA: Insufficient documentation

## 2016-09-28 DIAGNOSIS — Z794 Long term (current) use of insulin: Secondary | ICD-10-CM

## 2016-09-28 DIAGNOSIS — E1142 Type 2 diabetes mellitus with diabetic polyneuropathy: Secondary | ICD-10-CM

## 2016-09-28 DIAGNOSIS — Z760 Encounter for issue of repeat prescription: Secondary | ICD-10-CM | POA: Insufficient documentation

## 2016-09-28 LAB — GLUCOSE, POCT (MANUAL RESULT ENTRY): POC GLUCOSE: 171 mg/dL — AB (ref 70–99)

## 2016-09-28 LAB — POCT GLYCOSYLATED HEMOGLOBIN (HGB A1C): Hemoglobin A1C: 13.4

## 2016-09-28 MED ORDER — FLUOXETINE HCL 40 MG PO CAPS: 40.0000 mg | ORAL_CAPSULE | Freq: Every day | ORAL | 3 refills | Status: DC

## 2016-09-28 MED ORDER — FERROUS SULFATE 325 (65 FE) MG PO TABS: 325.0000 mg | ORAL_TABLET | Freq: Two times a day (BID) | ORAL | 3 refills | Status: DC

## 2016-09-28 MED ORDER — TRAZODONE HCL 50 MG PO TABS: 25.0000 mg | ORAL_TABLET | Freq: Every evening | ORAL | 3 refills | Status: DC | PRN

## 2016-09-28 MED ORDER — SIMVASTATIN 10 MG PO TABS: 10.0000 mg | ORAL_TABLET | Freq: Every day | ORAL | 3 refills | Status: DC

## 2016-09-28 MED ORDER — INSULIN ASPART 100 UNIT/ML ~~LOC~~ SOLN: 7.0000 [IU] | Freq: Three times a day (TID) | SUBCUTANEOUS | 3 refills | Status: DC | PRN

## 2016-09-28 MED ORDER — GABAPENTIN 300 MG PO CAPS: 300.0000 mg | ORAL_CAPSULE | Freq: Three times a day (TID) | ORAL | 3 refills | Status: DC

## 2016-09-28 MED ORDER — METFORMIN HCL 1000 MG PO TABS: 1000.0000 mg | ORAL_TABLET | Freq: Two times a day (BID) | ORAL | 3 refills | Status: DC

## 2016-09-28 MED ORDER — LISINOPRIL-HYDROCHLOROTHIAZIDE 20-12.5 MG PO TABS: 1.0000 | ORAL_TABLET | Freq: Every day | ORAL | 3 refills | Status: DC

## 2016-09-28 MED ORDER — INSULIN GLARGINE 100 UNIT/ML SOLOSTAR PEN: 35.0000 [IU] | PEN_INJECTOR | Freq: Every day | SUBCUTANEOUS | 3 refills | Status: DC

## 2016-09-28 NOTE — Patient Instructions (Signed)
Diabetes Mellitus and Food It is important for you to manage your blood sugar (glucose) level. Your blood glucose level can be greatly affected by what you eat. Eating healthier foods in the appropriate amounts throughout the day at about the same time each day will help you control your blood glucose level. It can also help slow or prevent worsening of your diabetes mellitus. Healthy eating may even help you improve the level of your blood pressure and reach or maintain a healthy weight. General recommendations for healthful eating and cooking habits include:  Eating meals and snacks regularly. Avoid going long periods of time without eating to lose weight.  Eating a diet that consists mainly of plant-based foods, such as fruits, vegetables, nuts, legumes, and whole grains.  Using low-heat cooking methods, such as baking, instead of high-heat cooking methods, such as deep frying.  Work with your dietitian to make sure you understand how to use the Nutrition Facts information on food labels. How can food affect me? Carbohydrates Carbohydrates affect your blood glucose level more than any other type of food. Your dietitian will help you determine how many carbohydrates to eat at each meal and teach you how to count carbohydrates. Counting carbohydrates is important to keep your blood glucose at a healthy level, especially if you are using insulin or taking certain medicines for diabetes mellitus. Alcohol Alcohol can cause sudden decreases in blood glucose (hypoglycemia), especially if you use insulin or take certain medicines for diabetes mellitus. Hypoglycemia can be a life-threatening condition. Symptoms of hypoglycemia (sleepiness, dizziness, and disorientation) are similar to symptoms of having too much alcohol. If your health care provider has given you approval to drink alcohol, do so in moderation and use the following guidelines:  Women should not have more than one drink per day, and men  should not have more than two drinks per day. One drink is equal to: ? 12 oz of beer. ? 5 oz of wine. ? 1 oz of hard liquor.  Do not drink on an empty stomach.  Keep yourself hydrated. Have water, diet soda, or unsweetened iced tea.  Regular soda, juice, and other mixers might contain a lot of carbohydrates and should be counted.  What foods are not recommended? As you make food choices, it is important to remember that all foods are not the same. Some foods have fewer nutrients per serving than other foods, even though they might have the same number of calories or carbohydrates. It is difficult to get your body what it needs when you eat foods with fewer nutrients. Examples of foods that you should avoid that are high in calories and carbohydrates but low in nutrients include:  Trans fats (most processed foods list trans fats on the Nutrition Facts label).  Regular soda.  Juice.  Candy.  Sweets, such as cake, pie, doughnuts, and cookies.  Fried foods.  What foods can I eat? Eat nutrient-rich foods, which will nourish your body and keep you healthy. The food you should eat also will depend on several factors, including:  The calories you need.  The medicines you take.  Your weight.  Your blood glucose level.  Your blood pressure level.  Your cholesterol level.  You should eat a variety of foods, including:  Protein. ? Lean cuts of meat. ? Proteins low in saturated fats, such as fish, egg whites, and beans. Avoid processed meats.  Fruits and vegetables. ? Fruits and vegetables that may help control blood glucose levels, such as apples,   mangoes, and yams.  Dairy products. ? Choose fat-free or low-fat dairy products, such as milk, yogurt, and cheese.  Grains, bread, pasta, and rice. ? Choose whole grain products, such as multigrain bread, whole oats, and brown rice. These foods may help control blood pressure.  Fats. ? Foods containing healthful fats, such as  nuts, avocado, olive oil, canola oil, and fish.  Does everyone with diabetes mellitus have the same meal plan? Because every person with diabetes mellitus is different, there is not one meal plan that works for everyone. It is very important that you meet with a dietitian who will help you create a meal plan that is just right for you. This information is not intended to replace advice given to you by your health care provider. Make sure you discuss any questions you have with your health care provider. Document Released: 11/18/2004 Document Revised: 07/30/2015 Document Reviewed: 01/18/2013 Elsevier Interactive Patient Education  2017 Elsevier Inc. Diabetes Mellitus and Exercise Exercising regularly is important for your overall health, especially when you have diabetes (diabetes mellitus). Exercising is not only about losing weight. It has many health benefits, such as increasing muscle strength and bone density and reducing body fat and stress. This leads to improved fitness, flexibility, and endurance, all of which result in better overall health. Exercise has additional benefits for people with diabetes, including:  Reducing appetite.  Helping to lower and control blood glucose.  Lowering blood pressure.  Helping to control amounts of fatty substances (lipids) in the blood, such as cholesterol and triglycerides.  Helping the body to respond better to insulin (improving insulin sensitivity).  Reducing how much insulin the body needs.  Decreasing the risk for heart disease by: ? Lowering cholesterol and triglyceride levels. ? Increasing the levels of good cholesterol. ? Lowering blood glucose levels.  What is my activity plan? Your health care provider or certified diabetes educator can help you make a plan for the type and frequency of exercise (activity plan) that works for you. Make sure that you:  Do at least 150 minutes of moderate-intensity or vigorous-intensity exercise each  week. This could be brisk walking, biking, or water aerobics. ? Do stretching and strength exercises, such as yoga or weightlifting, at least 2 times a week. ? Spread out your activity over at least 3 days of the week.  Get some form of physical activity every day. ? Do not go more than 2 days in a row without some kind of physical activity. ? Avoid being inactive for more than 90 minutes at a time. Take frequent breaks to walk or stretch.  Choose a type of exercise or activity that you enjoy, and set realistic goals.  Start slowly, and gradually increase the intensity of your exercise over time.  What do I need to know about managing my diabetes?  Check your blood glucose before and after exercising. ? If your blood glucose is higher than 240 mg/dL (13.3 mmol/L) before you exercise, check your urine for ketones. If you have ketones in your urine, do not exercise until your blood glucose returns to normal.  Know the symptoms of low blood glucose (hypoglycemia) and how to treat it. Your risk for hypoglycemia increases during and after exercise. Common symptoms of hypoglycemia can include: ? Hunger. ? Anxiety. ? Sweating and feeling clammy. ? Confusion. ? Dizziness or feeling light-headed. ? Increased heart rate or palpitations. ? Blurry vision. ? Tingling or numbness around the mouth, lips, or tongue. ? Tremors or shakes. ?   Irritability.  Keep a rapid-acting carbohydrate snack available before, during, and after exercise to help prevent or treat hypoglycemia.  Avoid injecting insulin into areas of the body that are going to be exercised. For example, avoid injecting insulin into: ? The arms, when playing tennis. ? The legs, when jogging.  Keep records of your exercise habits. Doing this can help you and your health care provider adjust your diabetes management plan as needed. Write down: ? Food that you eat before and after you exercise. ? Blood glucose levels before and after you  exercise. ? The type and amount of exercise you have done. ? When your insulin is expected to peak, if you use insulin. Avoid exercising at times when your insulin is peaking.  When you start a new exercise or activity, work with your health care provider to make sure the activity is safe for you, and to adjust your insulin, medicines, or food intake as needed.  Drink plenty of water while you exercise to prevent dehydration or heat stroke. Drink enough fluid to keep your urine clear or pale yellow. This information is not intended to replace advice given to you by your health care provider. Make sure you discuss any questions you have with your health care provider. Document Released: 05/14/2003 Document Revised: 09/11/2015 Document Reviewed: 08/03/2015 Elsevier Interactive Patient Education  2018 Elsevier Inc. Blood Glucose Monitoring, Adult Monitoring your blood sugar (glucose) helps you manage your diabetes. It also helps you and your health care provider determine how well your diabetes management plan is working. Blood glucose monitoring involves checking your blood glucose as often as directed, and keeping a record (log) of your results over time. Why should I monitor my blood glucose? Checking your blood glucose regularly can:  Help you understand how food, exercise, illnesses, and medicines affect your blood glucose.  Let you know what your blood glucose is at any time. You can quickly tell if you are having low blood glucose (hypoglycemia) or high blood glucose (hyperglycemia).  Help you and your health care provider adjust your medicines as needed.  When should I check my blood glucose? Follow instructions from your health care provider about how often to check your blood glucose. This may depend on:  The type of diabetes you have.  How well-controlled your diabetes is.  Medicines you are taking.  If you have type 1 diabetes:  Check your blood glucose at least 2 times a  day.  Also check your blood glucose: ? Before every insulin injection. ? Before and after exercise. ? Between meals. ? 2 hours after a meal. ? Occasionally between 2:00 a.m. and 3:00 a.m., as directed. ? Before potentially dangerous tasks, like driving or using heavy machinery. ? At bedtime.  You may need to check your blood glucose more often, up to 6-10 times a day: ? If you use an insulin pump. ? If you need multiple daily injections (MDI). ? If your diabetes is not well-controlled. ? If you are ill. ? If you have a history of severe hypoglycemia. ? If you have a history of not knowing when your blood glucose is getting low (hypoglycemia unawareness). If you have type 2 diabetes:  If you take insulin or other diabetes medicines, check your blood glucose at least 2 times a day.  If you are on intensive insulin therapy, check your blood glucose at least 4 times a day. Occasionally, you may also need to check between 2:00 a.m. and 3:00 a.m., as directed.    Also check your blood glucose: ? Before and after exercise. ? Before potentially dangerous tasks, like driving or using heavy machinery.  You may need to check your blood glucose more often if: ? Your medicine is being adjusted. ? Your diabetes is not well-controlled. ? You are ill. What is a blood glucose log?  A blood glucose log is a record of your blood glucose readings. It helps you and your health care provider: ? Look for patterns in your blood glucose over time. ? Adjust your diabetes management plan as needed.  Every time you check your blood glucose, write down your result and notes about things that may be affecting your blood glucose, such as your diet and exercise for the day.  Most glucose meters store a record of glucose readings in the meter. Some meters allow you to download your records to a computer. How do I check my blood glucose? Follow these steps to get accurate readings of your blood  glucose: Supplies needed   Blood glucose meter.  Test strips for your meter. Each meter has its own strips. You must use the strips that come with your meter.  A needle to prick your finger (lancet). Do not use lancets more than once.  A device that holds the lancet (lancing device).  A journal or log book to write down your results. Procedure  Wash your hands with soap and water.  Prick the side of your finger (not the tip) with the lancet. Use a different finger each time.  Gently rub the finger until a small drop of blood appears.  Follow instructions that come with your meter for inserting the test strip, applying blood to the strip, and using your blood glucose meter.  Write down your result and any notes. Alternative testing sites  Some meters allow you to use areas of your body other than your finger (alternative sites) to test your blood.  If you think you may have hypoglycemia, or if you have hypoglycemia unawareness, do not use alternative sites. Use your finger instead.  Alternative sites may not be as accurate as the fingers, because blood flow is slower in these areas. This means that the result you get may be delayed, and it may be different from the result that you would get from your finger.  The most common alternative sites are: ? Forearm. ? Thigh. ? Palm of the hand. Additional tips  Always keep your supplies with you.  If you have questions or need help, all blood glucose meters have a 24-hour "hotline" number that you can call. You may also contact your health care provider.  After you use a few boxes of test strips, adjust (calibrate) your blood glucose meter by following instructions that came with your meter. This information is not intended to replace advice given to you by your health care provider. Make sure you discuss any questions you have with your health care provider. Document Released: 02/24/2003 Document Revised: 09/11/2015 Document  Reviewed: 08/03/2015 Elsevier Interactive Patient Education  2017 Elsevier Inc.  

## 2016-09-28 NOTE — Progress Notes (Signed)
Mariah Miller, is a 41 y.o. female  IRS:[id number]  [id number]  DOB - May 05, 1975  Chief Complaint  Patient presents with  . Diabetes  . Follow-up       Subjective:   Mariah Miller is a 41 y.o. female with history of hypertension, type 2 diabetes mellitus uncontrolled with recent hemoglobin A1c of 12.2%, dyslipidemia and morbid obesity here today for a routine follow up visit of diabetes and hypertension and medication refill. Patient has been undergoing domestic stress, mother was diagnosed with a heart condition and may "die suddenly anytime" according to her doctor. She has a lot of family stress and demands, she does not sleep well although she works 3rd shift, she tries to sleep during the day but couldn't most of the time. Sleep study was fine before. She has not been entirely adherent to all her medications, she takes them occasionally especially because she has to work third shift. She reports no complaints today. She denies any nausea or vomiting. She denies any hypoglycemic event. Patient was recently seen in the emergency department on June 21 for insect bites. Patient has No headache, No chest pain, No abdominal pain - No Nausea, No new weakness tingling or numbness, No Cough - SOB. Patient needs refill on all her medications.  Problem  Primary Insomnia    ALLERGIES: Allergies  Allergen Reactions  . Tomato Itching and Rash    PAST MEDICAL HISTORY: Past Medical History:  Diagnosis Date  . Diabetes mellitus without complication ([place])   . Hypertension   . Migraine   . Seizures ([place])    self reported    MEDICATIONS AT HOME: Prior to Admission medications   Medication Sig Start Date End Date Taking? Authorizing Provider  aspirin EC 81 MG tablet Take 1 tablet (81 mg total) by mouth daily. 01/22/13  Yes Dhungel, Nishant, MD  Blood Glucose Monitoring Suppl (TRUE METRIX METER) w/Device KIT USE AS DIRECTED 05/01/15  Yes Tristy Udovich E, MD  ferrous sulfate  325 (65 FE) MG tablet Take 1 tablet (325 mg total) by mouth 2 (two) times daily with a meal. 09/28/16  Yes Andren Bethea E, MD  FLUoxetine (PROZAC) 40 MG capsule Take 1 capsule (40 mg total) by mouth daily. 09/28/16  Yes Tresa Garter, MD  gabapentin (NEURONTIN) 300 MG capsule Take 1 capsule (300 mg total) by mouth 3 (three) times daily. 09/28/16  Yes Angelica Chessman E, MD  glucose blood (TRUE METRIX BLOOD GLUCOSE TEST) test strip Use as instructed 10/08/15  Yes Jeric Slagel E, MD  insulin aspart (NOVOLOG) 100 UNIT/ML injection Inject 7-12 Units into the skin 3 (three) times daily as needed for high blood sugar. 301 - 350 = 12 units, > 350, call or come to clinic 09/28/16  Yes Kenadi Miltner E, MD  Insulin Glargine (LANTUS SOLOSTAR) 100 UNIT/ML Solostar Pen Inject 35 Units into the skin daily. 09/28/16  Yes Tresa Garter, MD  lisinopril-hydrochlorothiazide (PRINZIDE,ZESTORETIC) 20-12.5 MG tablet Take 1 tablet by mouth daily. 09/28/16  Yes Tresa Garter, MD  metFORMIN (GLUCOPHAGE) 1000 MG tablet Take 1 tablet (1,000 mg total) by mouth 2 (two) times daily with a meal. 09/28/16  Yes Benjamyn Hestand E, MD  simvastatin (ZOCOR) 10 MG tablet Take 1 tablet (10 mg total) by mouth at bedtime. 09/28/16  Yes Tresa Garter, MD  TRUEPLUS LANCETS 28G MISC USE AS INSTRUCTED 05/01/15  Yes Tresa Garter, MD  traZODone (DESYREL) 50 MG tablet Take 0.5-1 tablets (25-50 mg total) by  mouth at bedtime as needed for sleep. 09/28/16   Tresa Garter, MD    Objective:   Vitals:   09/28/16 0838  BP: (!) 157/90  Pulse: 91  Resp: 18  Temp: 98.1 F (36.7 C)  TempSrc: Oral  SpO2: 100%  Weight: 208 lb 6.4 oz (94.5 kg)  Height: _0  (1.651 m)   Exam General appearance : Awake, alert, not in any distress. Speech Clear. Not toxic looking, obese HEENT: Atraumatic and Normocephalic, pupils equally reactive to light and accomodation Neck: Supple, no JVD. No cervical  lymphadenopathy.  Chest: Good air entry bilaterally, no added sounds  CVS: S1 S2 regular, no murmurs.  Abdomen: Bowel sounds present, Non tender and not distended with no gaurding, rigidity or rebound. Extremities: B/L Lower Ext shows no edema, both legs are warm to touch Neurology: Awake alert, and oriented X 3, CN II-XII intact, Non focal Skin: No Rash  Data Review Lab Results  Component Value Date   HGBA1C 13.4 09/28/2016   HGBA1C 12.2 05/25/2016   HGBA1C 11.7 09/24/2015    Assessment & Plan   1. Type 2 diabetes mellitus with diabetic polyneuropathy, with long-term current use of insulin ([place]): Largely uncontrolled due partly to non-adherence  - POCT A1C - Glucose (CBG) - gabapentin (NEURONTIN) 300 MG capsule; Take 1 capsule (300 mg total) by mouth 3 (three) times daily.  Dispense: 270 capsule; Refill: 3 Increase Lantus insulin to 35 units from 30- Insulin Glargine (LANTUS SOLOSTAR) 100 UNIT/ML Solostar Pen; Inject 35 Units into the skin daily.  Dispense: 27 pen; Refill: 3 - insulin aspart (NOVOLOG) 100 UNIT/ML injection; Inject 7-12 Units into the skin 3 (three) times daily as needed for high blood sugar. 301 - 350 = 12 units, > 350, call or come to clinic  Dispense: 10 mL; Refill: 3 - metFORMIN (GLUCOPHAGE) 1000 MG tablet; Take 1 tablet (1,000 mg total) by mouth 2 (two) times daily with a meal.  Dispense: 180 tablet; Refill: 3 - Ambulatory referral to Ophthalmology - Ambulatory referral to Podiatry  2. Iron deficiency anemia, unspecified iron deficiency anemia type Refill - ferrous sulfate 325 (65 FE) MG tablet; Take 1 tablet (325 mg total) by mouth 2 (two) times daily with a meal.  Dispense: 180 tablet; Refill: 3  3. Moderate episode of recurrent major depressive disorder (HCC)  - FLUoxetine (PROZAC) 40 MG capsule; Take 1 capsule (40 mg total) by mouth daily.  Dispense: 30 capsule; Refill: 3  4. Essential hypertension  - lisinopril-hydrochlorothiazide  (PRINZIDE,ZESTORETIC) 20-12.5 MG tablet; Take 1 tablet by mouth daily.  Dispense: 90 tablet; Refill: 3  5. Dyslipidemia Refill - simvastatin (ZOCOR) 10 MG tablet; Take 1 tablet (10 mg total) by mouth at bedtime.  Dispense: 90 tablet; Refill: 3  6. Primary insomnia  Start - traZODone (DESYREL) 50 MG tablet; Take 0.5-1 tablets (25-50 mg total) by mouth at bedtime as needed for sleep.  Dispense: 30 tablet; Refill: 3  Patient have been counseled extensively about nutrition and exercise. Other issues discussed during this visit include: low cholesterol diet, weight control and daily exercise, foot care, annual eye examinations at Ophthalmology, importance of adherence with medications and regular follow-up. We also discussed long term complications of uncontrolled diabetes and hypertension.   Return in about 3 months (around 12/29/2016) for Hemoglobin A1C and Follow up, DM, Follow up HTN.  The patient was given clear instructions to go to ER or return to medical center if symptoms don't improve, worsen or new problems develop. The  patient verbalized understanding. The patient was told to call to get lab results if they haven't heard anything in the next week.   This note has been created with Surveyor, quantity. Any transcriptional errors are unintentional.    Angelica Chessman, MD, [place], Karilyn Cota, [place] and [facility] [place], [place]   09/28/2016, 9:27 AM

## 2016-09-28 NOTE — Progress Notes (Signed)
Patient ate at 4 am   Patient has had medication  Patient needs refill on all medication  Patient is not sleeping

## 2016-10-12 MED FILL — FERROUS SULFATE 325 MG TAB: 325 (65 FE) | 30 days supply | Qty: 60 | Fill #1

## 2016-10-12 MED FILL — FLUoxetine HCL 40 MG CAPS: 40 | 30 days supply | Qty: 30 | Fill #1

## 2016-10-12 MED FILL — $LANTUS SOLOSTAR 100 UNITS/: 100 | 29 days supply | Qty: 9 | Fill #3

## 2016-10-12 MED FILL — ?METFORMIN HCL 1,000 MG TAB: 1000 | 30 days supply | Qty: 60 | Fill #1

## 2016-10-12 MED FILL — SIMVASTATIN 10 MG TABLET: 10 | 30 days supply | Qty: 30 | Fill #1

## 2016-10-12 MED FILL — GABAPENTIN 300 MG CAPSULE: 300 | 30 days supply | Qty: 90 | Fill #1

## 2016-10-12 MED FILL — LISINOPRIL-HCTZ 20-12.5 MG: 20-12.5 | 30 days supply | Qty: 30 | Fill #1

## 2016-11-22 ENCOUNTER — Other Ambulatory Visit: Payer: Self-pay | Admitting: *Deleted

## 2016-11-22 MED FILL — $LANTUS SOLOSTAR 100 UNITS/: 100 | 29 days supply | Qty: 9 | Fill #4

## 2016-11-22 MED FILL — !NOVOLOG 100UNITS/ML VIAL: 100/ML | 28 days supply | Qty: 10 | Fill #1

## 2016-11-22 NOTE — Telephone Encounter (Signed)
PASS APPLICATION SIGNED!

## 2016-11-29 ENCOUNTER — Emergency Department (HOSPITAL_COMMUNITY)
Admission: EM | Admit: 2016-11-29 | Discharge: 2016-11-30 | Disposition: A | Discharge status: Home / Self Care | Payer: Self-pay | Attending: Emergency Medicine | Admitting: Emergency Medicine

## 2016-11-29 ENCOUNTER — Encounter (HOSPITAL_COMMUNITY): Payer: Self-pay | Admitting: Emergency Medicine

## 2016-11-29 ENCOUNTER — Emergency Department (HOSPITAL_COMMUNITY): Payer: Self-pay

## 2016-11-29 DIAGNOSIS — Z5321 Procedure and treatment not carried out due to patient leaving prior to being seen by health care provider: Secondary | ICD-10-CM | POA: Insufficient documentation

## 2016-11-29 DIAGNOSIS — R569 Unspecified convulsions: Secondary | ICD-10-CM | POA: Insufficient documentation

## 2016-11-29 DIAGNOSIS — R51 Headache: Secondary | ICD-10-CM | POA: Insufficient documentation

## 2016-11-29 LAB — BASIC METABOLIC PANEL
Anion gap: 4 — ABNORMAL LOW (ref 5–15)
BUN: 7 mg/dL (ref 6–20)
CHLORIDE: 106 mmol/L (ref 101–111)
CO2: 24 mmol/L (ref 22–32)
Calcium: 8.9 mg/dL (ref 8.9–10.3)
Creatinine, Ser: 0.73 mg/dL (ref 0.44–1.00)
GFR calc Af Amer: >60 mL/min (ref >60)
GFR calc non Af Amer: >60 mL/min (ref >60)
GLUCOSE: 87 mg/dL (ref 65–99)
Potassium: 3.3 mmol/L — ABNORMAL LOW (ref 3.5–5.1)
Sodium: 134 mmol/L — ABNORMAL LOW (ref 135–145)

## 2016-11-29 LAB — CBC
HEMATOCRIT: 36 % (ref 36.0–46.0)
Hemoglobin: 11.7 g/dL — ABNORMAL LOW (ref 12.0–15.0)
MCH: 29.5 pg (ref 26.0–34.0)
MCHC: 32.5 g/dL (ref 30.0–36.0)
MCV: 90.9 fL (ref 78.0–100.0)
Platelets: 362 10*3/uL (ref 150–400)
RBC: 3.96 MIL/uL (ref 3.87–5.11)
RDW: 13.5 % (ref 11.5–15.5)
WBC: 5.8 10*3/uL (ref 4.0–10.5)

## 2016-11-29 LAB — HCG, QUANTITATIVE, PREGNANCY: hCG, Beta Chain, Quant, S: <1 m[IU]/mL (ref <5)

## 2016-11-29 NOTE — ED Notes (Signed)
Pt reports having a seizure around 1500 and falling and hitting her L side of head. Pt states she doesn't have a hx of seizures and doesn't take medicine but she has had one in past "related to stress" Pt states light sensitivity and HA. Pt is A/OX4

## 2016-11-30 NOTE — ED Notes (Signed)
Called for room x5, no answer.

## 2016-12-19 ENCOUNTER — Ambulatory Visit: Payer: Self-pay | Attending: Internal Medicine | Admitting: Physician Assistant

## 2016-12-19 ENCOUNTER — Encounter: Payer: Self-pay | Admitting: Physician Assistant

## 2016-12-19 VITALS — BP 153/76 | HR 75 | Temp 98.4°F | Resp 18 | Ht 65.0 in | Wt 212.0 lb

## 2016-12-19 DIAGNOSIS — Z79899 Other long term (current) drug therapy: Secondary | ICD-10-CM | POA: Insufficient documentation

## 2016-12-19 DIAGNOSIS — E876 Hypokalemia: Secondary | ICD-10-CM

## 2016-12-19 DIAGNOSIS — Z91018 Allergy to other foods: Secondary | ICD-10-CM | POA: Insufficient documentation

## 2016-12-19 DIAGNOSIS — I10 Essential (primary) hypertension: Secondary | ICD-10-CM | POA: Insufficient documentation

## 2016-12-19 DIAGNOSIS — Z7982 Long term (current) use of aspirin: Secondary | ICD-10-CM | POA: Insufficient documentation

## 2016-12-19 DIAGNOSIS — E1142 Type 2 diabetes mellitus with diabetic polyneuropathy: Secondary | ICD-10-CM

## 2016-12-19 DIAGNOSIS — Z794 Long term (current) use of insulin: Secondary | ICD-10-CM

## 2016-12-19 LAB — GLUCOSE, POCT (MANUAL RESULT ENTRY): POC GLUCOSE: 313 mg/dL — AB (ref 70–99)

## 2016-12-19 LAB — POCT URINALYSIS DIPSTICK
BILIRUBIN UA: NEGATIVE
GLUCOSE UA: 500
Ketones, UA: NEGATIVE
Leukocytes, UA: NEGATIVE
Nitrite, UA: NEGATIVE
Protein, UA: 30
RBC UA: NEGATIVE
SPEC GRAV UA: 1.02 (ref 1.010–1.025)
UROBILINOGEN UA: 0.2 U/dL
pH, UA: 5.5 (ref 5.0–8.0)

## 2016-12-19 LAB — POCT GLYCOSYLATED HEMOGLOBIN (HGB A1C): Hemoglobin A1C: 10.6

## 2016-12-19 MED ORDER — INSULIN GLARGINE 100 UNIT/ML SOLOSTAR PEN: 42.0000 [IU] | PEN_INJECTOR | Freq: Every day | SUBCUTANEOUS | 3 refills | Status: DC

## 2016-12-19 NOTE — Progress Notes (Signed)
Patient ID: EVOLET SALMINEN, female   DOB: [birth year], 41 y.o.   MRN: [id number]     Hareem Surowiec, is a 41 y.o. female  VPX:[id number]  IOE:[id number]  DOB - [birth year]  Subjective:  Chief Complaint and HPI: Mariah Miller is a 41 y.o. female here today because blood sugars have been high and she hasn't been to work in a few days.   Not feeling well/weak/fatigued.  Also having some muscle cramping.  No CP, no palpitations.  Blood sugars running from 11(rarely) to 400.  usu in mid 200s based on the log she is bringing with her today.  Blood sugar 369 yesterday-she went to the ED but didn't want to wait.  Her employer told her to come here today.    ROS:   Constitutional:  No f/c, No night sweats, No unexplained weight loss. EENT:  No vision changes, No blurry vision, No hearing changes. No mouth, throat, or ear problems.  Respiratory: No cough, No SOB Cardiac: No CP, no palpitations GI:  No abd pain, No N/V/D. GU: No Urinary s/sx Musculoskeletal: No joint pain Neuro: No headache, no dizziness, no motor weakness.  Skin: No rash Endocrine:  No polydipsia. No polyuria.  Psych: Denies SI/HI  No problems updated.  ALLERGIES: Allergies  Allergen Reactions  . Tomato Itching and Rash    PAST MEDICAL HISTORY: Past Medical History:  Diagnosis Date  . Diabetes mellitus without complication ([place])   . Hypertension   . Migraine   . Seizures ([place])    self reported    MEDICATIONS AT HOME: Prior to Admission medications   Medication Sig Start Date End Date Taking? Authorizing Provider  aspirin EC 81 MG tablet Take 1 tablet (81 mg total) by mouth daily. 01/22/13   Dhungel, Flonnie Overman, MD  Blood Glucose Monitoring Suppl (TRUE METRIX METER) w/Device KIT USE AS DIRECTED 05/01/15   Tresa Garter, MD  ferrous sulfate 325 (65 FE) MG tablet Take 1 tablet (325 mg total) by mouth 2 (two) times daily with a meal. 09/28/16   Jegede, Olugbemiga E, MD  FLUoxetine (PROZAC) 40 MG capsule  Take 1 capsule (40 mg total) by mouth daily. 09/28/16   Tresa Garter, MD  gabapentin (NEURONTIN) 300 MG capsule Take 1 capsule (300 mg total) by mouth 3 (three) times daily. 09/28/16   Tresa Garter, MD  glucose blood (TRUE METRIX BLOOD GLUCOSE TEST) test strip Use as instructed 10/08/15   Angelica Chessman E, MD  insulin aspart (NOVOLOG) 100 UNIT/ML injection Inject 7-12 Units into the skin 3 (three) times daily as needed for high blood sugar. 301 - 350 = 12 units, > 350, call or come to clinic 09/28/16   Tresa Garter, MD  Insulin Glargine (LANTUS SOLOSTAR) 100 UNIT/ML Solostar Pen Inject 42 Units into the skin daily. 12/19/16   Argentina Donovan, PA-C  lisinopril-hydrochlorothiazide (PRINZIDE,ZESTORETIC) 20-12.5 MG tablet Take 1 tablet by mouth daily. 09/28/16   Tresa Garter, MD  metFORMIN (GLUCOPHAGE) 1000 MG tablet Take 1 tablet (1,000 mg total) by mouth 2 (two) times daily with a meal. 09/28/16   Tresa Garter, MD  simvastatin (ZOCOR) 10 MG tablet Take 1 tablet (10 mg total) by mouth at bedtime. 09/28/16   Tresa Garter, MD  traZODone (DESYREL) 50 MG tablet Take 0.5-1 tablets (25-50 mg total) by mouth at bedtime as needed for sleep. 09/28/16   Tresa Garter, MD  TRUEPLUS LANCETS 28G MISC USE AS INSTRUCTED 05/01/15   Angelica Chessman  E, MD     Objective:  EXAM:   Vitals:   12/19/16 1027  BP: (!) 153/76  Pulse: 75  Resp: 18  Temp: 98.4 F (36.9 C)  TempSrc: Oral  SpO2: 99%  Weight: 212 lb (96.2 kg)  Height: '5\' 5"'$  (1.651 m)    General appearance : A&OX3. NAD. Non-toxic-appearing, pleasant, alert, cooperative, does not appear ill.   HEENT: Atraumatic and Normocephalic.  PERRLA. EOM intact.  Neck: supple, no JVD. No cervical lymphadenopathy. No thyromegaly Chest/Lungs:  Breathing-non-labored, Good air entry bilaterally, breath sounds normal without rales, rhonchi, or wheezing  CVS: S1 S2 regular, no murmurs, gallops, rubs  Extremities:  Bilateral Lower Ext shows no edema, both legs are warm to touch with = pulse throughout Neurology:  CN II-XII grossly intact, Non focal.   Psych:  TP linear. J/I WNL. Normal speech. Appropriate eye contact and affect.  Skin:  No Rash  Data Review Lab Results  Component Value Date   HGBA1C 10.6 12/19/2016   HGBA1C 13.4 09/28/2016   HGBA1C 12.2 05/25/2016     Assessment & Plan   1. Type 2 diabetes mellitus with diabetic polyneuropathy, with long-term current use of insulin ([place]) Uncontrolled but better than last visit.  OOW X 2 days - Glucose (CBG) - HgB A1c - Urinalysis Dipstick Increase dose- Insulin Glargine (LANTUS SOLOSTAR) 100 UNIT/ML Solostar Pen; Inject 42 Units into the skin daily.  Dispense: 27 pen; Refill: 3 - Basic metabolic panel Continue checking sugar tid and record and bring to next visit.  Drink 80-100 ounces water/day. Discussed how to treat hypo/hyperkalemia  2. Hypokalemia - Basic metabolic panel   Patient have been counseled extensively about nutrition and exercise  Return in about 3 months (around 03/21/2017) for Dr Doreene Burke for DM management.  The patient was given clear instructions to go to ER or return to medical center if symptoms don't improve, worsen or new problems develop. The patient verbalized understanding. The patient was told to call to get lab results if they haven't heard anything in the next week.     Freeman Caldron, PA-C [facility] [place], [place]   12/19/2016, 10:38 AM

## 2016-12-20 ENCOUNTER — Telehealth: Payer: Self-pay | Admitting: *Deleted

## 2016-12-20 LAB — BASIC METABOLIC PANEL
BUN/Creatinine Ratio: 10 (ref 9–23)
BUN: 9 mg/dL (ref 6–24)
CO2: 23 mmol/L (ref 20–29)
CREATININE: 0.88 mg/dL (ref 0.57–1.00)
Calcium: 8.8 mg/dL (ref 8.7–10.2)
Chloride: 100 mmol/L (ref 96–106)
GFR calc Af Amer: 95 mL/min/{1.73_m2} (ref >59)
GFR, EST NON AFRICAN AMERICAN: 82 mL/min/{1.73_m2} (ref >59)
Glucose: 330 mg/dL — ABNORMAL HIGH (ref 65–99)
Potassium: 4.1 mmol/L (ref 3.5–5.2)
SODIUM: 137 mmol/L (ref 134–144)

## 2016-12-20 NOTE — Telephone Encounter (Signed)
Patient verified DOB Patient is aware of labs being normal and to continue with DM medications and checks. No further questions at this time.

## 2016-12-20 NOTE — Telephone Encounter (Signed)
-----   Message from Anders Simmonds, [place] sent at 12/20/2016  8:42 AM EDT ----- Your labs other than blood sugar were normal.  Check sugars and take meds as we discussed.  Follow-up as planned-sooner if needed. Thanks, Georgian Co, PA-C

## 2016-12-28 ENCOUNTER — Ambulatory Visit: Payer: Self-pay | Admitting: Internal Medicine

## 2017-01-09 MED FILL — $LANTUS SOLOSTAR 100 UNITS/: 100 | 28 days supply | Qty: 12 | Fill #0

## 2017-02-16 MED FILL — $LANTUS SOLOSTAR 100 UNITS/: 100 | 28 days supply | Qty: 12 | Fill #1

## 2017-02-16 MED FILL — ?HUMALOG 100 UNITS/ML VIAL: 100 | 27 days supply | Qty: 10 | Fill #0

## 2017-03-12 ENCOUNTER — Emergency Department (HOSPITAL_COMMUNITY): Payer: Self-pay

## 2017-03-12 ENCOUNTER — Emergency Department (HOSPITAL_COMMUNITY)
Admission: EM | Admit: 2017-03-12 | Discharge: 2017-03-12 | Disposition: A | Discharge status: Home / Self Care | Payer: Self-pay | Attending: Emergency Medicine | Admitting: Emergency Medicine

## 2017-03-12 ENCOUNTER — Other Ambulatory Visit: Payer: Self-pay

## 2017-03-12 ENCOUNTER — Encounter (HOSPITAL_COMMUNITY): Payer: Self-pay | Admitting: Emergency Medicine

## 2017-03-12 DIAGNOSIS — M549 Dorsalgia, unspecified: Secondary | ICD-10-CM | POA: Insufficient documentation

## 2017-03-12 DIAGNOSIS — Z7984 Long term (current) use of oral hypoglycemic drugs: Secondary | ICD-10-CM | POA: Insufficient documentation

## 2017-03-12 DIAGNOSIS — I10 Essential (primary) hypertension: Secondary | ICD-10-CM | POA: Insufficient documentation

## 2017-03-12 DIAGNOSIS — Z87891 Personal history of nicotine dependence: Secondary | ICD-10-CM | POA: Insufficient documentation

## 2017-03-12 DIAGNOSIS — Z79899 Other long term (current) drug therapy: Secondary | ICD-10-CM | POA: Insufficient documentation

## 2017-03-12 DIAGNOSIS — M542 Cervicalgia: Secondary | ICD-10-CM | POA: Insufficient documentation

## 2017-03-12 DIAGNOSIS — E119 Type 2 diabetes mellitus without complications: Secondary | ICD-10-CM | POA: Insufficient documentation

## 2017-03-12 DIAGNOSIS — Z7982 Long term (current) use of aspirin: Secondary | ICD-10-CM | POA: Insufficient documentation

## 2017-03-12 DIAGNOSIS — B9689 Other specified bacterial agents as the cause of diseases classified elsewhere: Secondary | ICD-10-CM

## 2017-03-12 DIAGNOSIS — N760 Acute vaginitis: Secondary | ICD-10-CM | POA: Insufficient documentation

## 2017-03-12 LAB — WET PREP, GENITAL
Sperm: NONE SEEN
Trich, Wet Prep: NONE SEEN
Yeast Wet Prep HPF POC: NONE SEEN

## 2017-03-12 LAB — URINALYSIS, ROUTINE W REFLEX MICROSCOPIC
Bacteria, UA: NONE SEEN
Bilirubin Urine: NEGATIVE
Glucose, UA: >=500 mg/dL — AB
HGB URINE DIPSTICK: NEGATIVE
KETONES UR: NEGATIVE mg/dL
LEUKOCYTES UA: NEGATIVE
Nitrite: NEGATIVE
PH: 7 (ref 5.0–8.0)
Protein, ur: NEGATIVE mg/dL
Specific Gravity, Urine: 1.035 — ABNORMAL HIGH (ref 1.005–1.030)
WBC, UA: NONE SEEN WBC/hpf (ref 0–5)

## 2017-03-12 LAB — POC URINE PREG, ED: PREG TEST UR: NEGATIVE

## 2017-03-12 MED ORDER — METRONIDAZOLE 500 MG PO TABS: 500.0000 mg | ORAL_TABLET | Freq: Two times a day (BID) | ORAL | 0 refills | Status: DC

## 2017-03-12 MED ORDER — NAPROXEN 500 MG PO TABS: 500.0000 mg | ORAL_TABLET | Freq: Two times a day (BID) | ORAL | 0 refills | Status: DC

## 2017-03-12 MED ORDER — OXYCODONE-ACETAMINOPHEN 5-325 MG PO TABS
1.0000 | ORAL_TABLET | Freq: Once | ORAL | Status: AC
  Administered 2017-03-12: 1 via ORAL
  Filled 2017-03-12: qty 1

## 2017-03-12 MED ORDER — METHOCARBAMOL 500 MG PO TABS: 500.0000 mg | ORAL_TABLET | Freq: Two times a day (BID) | ORAL | 0 refills | Status: DC
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
## 2017-03-13 LAB — GC/CHLAMYDIA PROBE AMP (~~LOC~~) NOT AT ARMC
Chlamydia: NEGATIVE
NEISSERIA GONORRHEA: NEGATIVE

## 2017-04-03 MED FILL — $LANTUS SOLOSTAR 100 UNITS/: 100 | 28 days supply | Qty: 12 | Fill #2

## 2017-04-03 MED FILL — ?HUMALOG 100 UNITS/ML VIAL: 100 | 27 days supply | Qty: 10 | Fill #1

## 2017-04-24 ENCOUNTER — Ambulatory Visit: Payer: No Typology Code available for payment source | Attending: Family Medicine | Admitting: Family Medicine

## 2017-04-24 ENCOUNTER — Encounter: Payer: Self-pay | Admitting: Family Medicine

## 2017-04-24 VITALS — BP 171/88 | HR 81 | Temp 98.5°F | Resp 16 | Ht 65.0 in | Wt 215.4 lb

## 2017-04-24 DIAGNOSIS — Z9049 Acquired absence of other specified parts of digestive tract: Secondary | ICD-10-CM | POA: Diagnosis not present

## 2017-04-24 DIAGNOSIS — Z794 Long term (current) use of insulin: Secondary | ICD-10-CM | POA: Diagnosis not present

## 2017-04-24 DIAGNOSIS — F331 Major depressive disorder, recurrent, moderate: Secondary | ICD-10-CM | POA: Diagnosis not present

## 2017-04-24 DIAGNOSIS — E1142 Type 2 diabetes mellitus with diabetic polyneuropathy: Secondary | ICD-10-CM | POA: Diagnosis not present

## 2017-04-24 DIAGNOSIS — K3184 Gastroparesis: Secondary | ICD-10-CM | POA: Diagnosis not present

## 2017-04-24 DIAGNOSIS — I10 Essential (primary) hypertension: Secondary | ICD-10-CM | POA: Diagnosis not present

## 2017-04-24 DIAGNOSIS — D509 Iron deficiency anemia, unspecified: Secondary | ICD-10-CM

## 2017-04-24 DIAGNOSIS — F5101 Primary insomnia: Secondary | ICD-10-CM | POA: Insufficient documentation

## 2017-04-24 DIAGNOSIS — Z7982 Long term (current) use of aspirin: Secondary | ICD-10-CM | POA: Insufficient documentation

## 2017-04-24 DIAGNOSIS — Z79899 Other long term (current) drug therapy: Secondary | ICD-10-CM | POA: Diagnosis not present

## 2017-04-24 DIAGNOSIS — Z23 Encounter for immunization: Secondary | ICD-10-CM | POA: Diagnosis not present

## 2017-04-24 DIAGNOSIS — E785 Hyperlipidemia, unspecified: Secondary | ICD-10-CM

## 2017-04-24 DIAGNOSIS — E119 Type 2 diabetes mellitus without complications: Secondary | ICD-10-CM | POA: Diagnosis present

## 2017-04-24 LAB — POCT CBG (FASTING - GLUCOSE)-MANUAL ENTRY: GLUCOSE FASTING, POC: 255 mg/dL — AB (ref 70–99)

## 2017-04-24 MED ORDER — LISINOPRIL-HYDROCHLOROTHIAZIDE 20-12.5 MG PO TABS: 1.0000 | ORAL_TABLET | Freq: Every day | ORAL | 3 refills | Status: DC

## 2017-04-24 MED ORDER — METFORMIN HCL 1000 MG PO TABS: 1000.0000 mg | ORAL_TABLET | Freq: Two times a day (BID) | ORAL | 3 refills | Status: DC

## 2017-04-24 MED ORDER — GABAPENTIN 300 MG PO CAPS: 300.0000 mg | ORAL_CAPSULE | Freq: Three times a day (TID) | ORAL | 3 refills | Status: DC

## 2017-04-24 MED ORDER — TRAZODONE HCL 50 MG PO TABS: 50.0000 mg | ORAL_TABLET | Freq: Every evening | ORAL | 3 refills | Status: DC | PRN

## 2017-04-24 MED ORDER — FERROUS SULFATE 325 (65 FE) MG PO TABS: 325.0000 mg | ORAL_TABLET | Freq: Two times a day (BID) | ORAL | 3 refills | Status: DC

## 2017-04-24 MED ORDER — FLUOXETINE HCL 40 MG PO CAPS: 40.0000 mg | ORAL_CAPSULE | Freq: Every day | ORAL | 3 refills | Status: DC

## 2017-04-24 MED ORDER — METOCLOPRAMIDE HCL 10 MG PO TABS: 10.0000 mg | ORAL_TABLET | Freq: Three times a day (TID) | ORAL | 1 refills | Status: DC

## 2017-04-24 MED ORDER — INSULIN GLARGINE 100 UNIT/ML SOLOSTAR PEN: 42.0000 [IU] | PEN_INJECTOR | Freq: Every day | SUBCUTANEOUS | 3 refills | Status: DC

## 2017-04-24 MED ORDER — SIMVASTATIN 10 MG PO TABS: 10.0000 mg | ORAL_TABLET | Freq: Every day | ORAL | 3 refills | Status: DC

## 2017-04-24 NOTE — Progress Notes (Signed)
Med RF Establish Care Request PAP today  And flu shot

## 2017-04-24 NOTE — Progress Notes (Signed)
Subjective:  Patient ID: Mariah Miller, female    DOB: December 08, 1975  Age: 42 y.o. MRN: [id number]  CC: Diabetes  HPI Mariah Miller is a 42 year old female with a history of type 2 diabetes mellitus (A1c 10.6), hypertension, insomnia, hyperlipidemia who presents today to get established with me.  She has been out of her oral medications but endorses compliance with her Lantus.  She denies visual concerns and is not up-to-date on annual eye exam.  She denies numbness in extremities.  She complains of insomnia and sleeps for 30 minutes to 1 hour prior to waking up.  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 She sometimes drinks coffee by 7 PM when she works third shift Tested for sleep apnea and sleep study was negative.  Her blood pressure is elevated due to the fact that she has been out of her antihypertensive. She denies chest pain, shortness of breath or other concerns.  Past Medical History:  Diagnosis Date  . Diabetes mellitus without complication ([place])   . Hypertension   . Migraine   . Seizures ([place])    self reported    Past Surgical History:  Procedure Laterality Date  . CHOLECYSTECTOMY    . CYST EXCISION    . SHOULDER OPEN ROTATOR CUFF REPAIR Left   . TONSILLECTOMY    . TUBAL LIGATION      Outpatient Medications Prior to Visit  Medication Sig Dispense Refill  . aspirin EC 81 MG tablet Take 1 tablet (81 mg total) by mouth daily. 30 tablet 5  . Blood Glucose Monitoring Suppl (TRUE METRIX METER) w/Device KIT USE AS DIRECTED 1 kit 0  . glucose blood (TRUE METRIX BLOOD GLUCOSE TEST) test strip Use as instructed 100 each 12  . insulin aspart (NOVOLOG) 100 UNIT/ML injection Inject 7-12 Units into the skin 3 (three) times daily as needed for high blood sugar. 301 - 350 = 12 units, > 350, call or come to clinic 10 mL 3  . methocarbamol (ROBAXIN) 500 MG tablet Take 1 tablet (500 mg total) by mouth 2 (two) times  daily. 20 tablet 0  . naproxen (NAPROSYN) 500 MG tablet Take 1 tablet (500 mg total) by mouth 2 (two) times daily. 30 tablet 0  . TRUEPLUS LANCETS 28G MISC USE AS INSTRUCTED 100 each 5  . ferrous sulfate 325 (65 FE) MG tablet Take 1 tablet (325 mg total) by mouth 2 (two) times daily with a meal. 180 tablet 3  . FLUoxetine (PROZAC) 40 MG capsule Take 1 capsule (40 mg total) by mouth daily. 30 capsule 3  . gabapentin (NEURONTIN) 300 MG capsule Take 1 capsule (300 mg total) by mouth 3 (three) times daily. 270 capsule 3  . Insulin Glargine (LANTUS SOLOSTAR) 100 UNIT/ML Solostar Pen Inject 42 Units into the skin daily. 27 pen 3  . lisinopril-hydrochlorothiazide (PRINZIDE,ZESTORETIC) 20-12.5 MG tablet Take 1 tablet by mouth daily. 90 tablet 3  . metFORMIN (GLUCOPHAGE) 1000 MG tablet Take 1 tablet (1,000 mg total) by mouth 2 (two) times daily with a meal. 180 tablet 3  . metroNIDAZOLE (FLAGYL) 500 MG tablet Take 1 tablet (500 mg total) by mouth 2 (two) times daily. 14 tablet 0  . simvastatin (ZOCOR) 10 MG tablet Take 1 tablet (10 mg total) by mouth at bedtime. 90 tablet 3  . traZODone (DESYREL) 50 MG tablet Take 0.5-1 tablets (25-50 mg total) by mouth at bedtime as needed for sleep. 30 tablet  3   No facility-administered medications prior to visit.     ROS Review of Systems  Constitutional: Negative for activity change, appetite change and fatigue.  HENT: Negative for congestion, sinus pressure and sore throat.   Eyes: Negative for visual disturbance.  Respiratory: Negative for cough, chest tightness, shortness of breath and wheezing.   Cardiovascular: Negative for chest pain and palpitations.  Gastrointestinal: Negative for abdominal distention, abdominal pain and constipation.  Endocrine: Negative for polydipsia.  Genitourinary: Negative for dysuria and frequency.  Musculoskeletal: Negative for arthralgias and back pain.  Skin: Negative for rash.  Neurological: Negative for tremors,  light-headedness and numbness.  Hematological: Does not bruise/bleed easily.  Psychiatric/Behavioral: Negative for agitation and behavioral problems.    Objective:  BP (!) 171/88 (BP Location: Left Arm, Patient Position: Sitting, Cuff Size: Large)   Pulse 81   Temp 98.5 F (36.9 C) (Oral)   Resp 16   Ht '5\' 5"'$  (1.651 m)   Wt 215 lb 6.4 oz (97.7 kg)   SpO2 100%   BMI 35.84 kg/m   BP/Weight 04/24/2017 03/12/2017 96/78/9381  Systolic BP 017 510 258  Diastolic BP 88 78 76  Wt. (Lbs) 215.4 204 212  BMI 35.84 33.95 35.28      Physical Exam  Constitutional: She is oriented to person, place, and time. She appears well-developed and well-nourished.  Cardiovascular: Normal rate, normal heart sounds and intact distal pulses.  No murmur heard. Pulmonary/Chest: Effort normal and breath sounds normal. She has no wheezes. She has no rales. She exhibits no tenderness.  Abdominal: Soft. Bowel sounds are normal. She exhibits no distension and no mass. There is no tenderness.  Musculoskeletal: Normal range of motion.  Neurological: She is alert and oriented to person, place, and time.  Skin: Skin is warm and dry.  Psychiatric: She has a normal mood and affect.     CMP Latest Ref Rng & Units 12/19/2016 11/29/2016 05/25/2016  Glucose 65 - 99 mg/dL 330(H) 87 125(H)  BUN 6 - 24 mg/dL '9 7 15  '$ Creatinine 0.57 - 1.00 mg/dL 0.88 0.73 0.81  Sodium 134 - 144 mmol/L 137 134(L) 140  Potassium 3.5 - 5.2 mmol/L 4.1 3.3(L) 3.7  Chloride 96 - 106 mmol/L 100 106 105  CO2 20 - 29 mmol/L '23 24 20  '$ Calcium 8.7 - 10.2 mg/dL 8.8 8.9 9.6  Total Protein 6.1 - 8.1 g/dL - - 7.2  Total Bilirubin 0.2 - 1.2 mg/dL - - 0.3  Alkaline Phos 33 - 115 U/L - - 62  AST 10 - 30 U/L - - 12  ALT 6 - 29 U/L - - 11    Lipid Panel     Component Value Date/Time   CHOL 193 05/25/2016 1543   TRIG 183 (H) 05/25/2016 1543   HDL 41 (L) 05/25/2016 1543   CHOLHDL 4.7 05/25/2016 1543   VLDL 37 (H) 05/25/2016 1543   LDLCALC 115 (H)  05/25/2016 1543    Lipid Panel     Component Value Date/Time   CHOL 193 05/25/2016 1543   TRIG 183 (H) 05/25/2016 1543   HDL 41 (L) 05/25/2016 1543   CHOLHDL 4.7 05/25/2016 1543   VLDL 37 (H) 05/25/2016 1543   LDLCALC 115 (H) 05/25/2016 1543    Lab Results  Component Value Date   HGBA1C 10.6 12/19/2016    CBC    Component Value Date/Time   WBC 5.8 11/29/2016 1933   RBC 3.96 11/29/2016 1933   HGB 11.7 (L) 11/29/2016 1933  HCT 36.0 11/29/2016 1933   PLT 362 11/29/2016 1933   MCV 90.9 11/29/2016 1933   MCH 29.5 11/29/2016 1933   MCHC 32.5 11/29/2016 1933   RDW 13.5 11/29/2016 1933   LYMPHSABS 3,735 05/25/2016 1543   MONOABS 913 05/25/2016 1543   EOSABS 83 05/25/2016 1543   BASOSABS 0 05/25/2016 1543    Assessment & Plan:   1. Type 2 diabetes mellitus with diabetic polyneuropathy, with long-term current use of insulin (HCC) Uncontrolled with A1c of 10.6 Send of A1c today and will adjust regimen accordingly Counseled on Diabetic diet, my plate method, 387 minutes of moderate intensity exercise/week Keep blood sugar logs with fasting goals of 80-120 mg/dl, random of less than 180 and in the event of sugars less than 60 mg/dl or greater than 400 mg/dl please notify the clinic ASAP. It is recommended that you undergo annual eye exams and annual foot exams. Pneumonia vaccine is recommended. - Glucose (CBG), Fasting - Hemoglobin A1c - gabapentin (NEURONTIN) 300 MG capsule; Take 1 capsule (300 mg total) by mouth 3 (three) times daily.  Dispense: 90 capsule; Refill: 3 - Insulin Glargine (LANTUS SOLOSTAR) 100 UNIT/ML Solostar Pen; Inject 42 Units into the skin daily.  Dispense: 5 pen; Refill: 3 - metFORMIN (GLUCOPHAGE) 1000 MG tablet; Take 1 tablet (1,000 mg total) by mouth 2 (two) times daily with a meal.  Dispense: 180 tablet; Refill: 3 - CMP14+EGFR - Lipid panel - Microalbumin/Creatinine Ratio, Urine - Ambulatory referral to Ophthalmology  2. Need for influenza  vaccination - Flu Vaccine QUAD 6+ mos PF IM (Fluarix Quad PF)  3. Moderate episode of recurrent major depressive disorder (HCC) Stable - FLUoxetine (PROZAC) 40 MG capsule; Take 1 capsule (40 mg total) by mouth daily.  Dispense: 30 capsule; Refill: 3  4. Essential hypertension Uncontrolled due to being out of medications which I have refilled Counseled on blood pressure goal of less than 130/80, low-sodium, DASH diet, medication compliance, 150 minutes of moderate intensity exercise per week. Discussed medication compliance, adverse effects. - lisinopril-hydrochlorothiazide (PRINZIDE,ZESTORETIC) 20-12.5 MG tablet; Take 1 tablet by mouth daily.  Dispense: 30 tablet; Refill: 3  5. Dyslipidemia LDL of 115 which is above goal of less than 100 Low-cholesterol diet recommended - simvastatin (ZOCOR) 10 MG tablet; Take 1 tablet (10 mg total) by mouth at bedtime.  Dispense: 90 tablet; Refill: 3  6. Primary insomnia Uncontrolled We have discussed sleep hygiene, avoid taking caffeinated products late in the afternoon. This is also secondary to shift work disorder We will commence trazodone - traZODone (DESYREL) 50 MG tablet; Take 1 tablet (50 mg total) by mouth at bedtime as needed for sleep.  Dispense: 30 tablet; Refill: 3  7. Iron deficiency anemia, unspecified iron deficiency anemia type Last hemoglobin was 11.7 - ferrous sulfate 325 (65 FE) MG tablet; Take 1 tablet (325 mg total) by mouth 2 (two) times daily with a meal.  Dispense: 180 tablet; Refill: 3   Meds ordered this encounter  Medications  . FLUoxetine (PROZAC) 40 MG capsule    Sig: Take 1 capsule (40 mg total) by mouth daily.    Dispense:  30 capsule    Refill:  3  . gabapentin (NEURONTIN) 300 MG capsule    Sig: Take 1 capsule (300 mg total) by mouth 3 (three) times daily.    Dispense:  90 capsule    Refill:  3  . Insulin Glargine (LANTUS SOLOSTAR) 100 UNIT/ML Solostar Pen    Sig: Inject 42 Units into the skin daily.  Dispense:  5 pen    Refill:  3  . lisinopril-hydrochlorothiazide (PRINZIDE,ZESTORETIC) 20-12.5 MG tablet    Sig: Take 1 tablet by mouth daily.    Dispense:  30 tablet    Refill:  3  . metFORMIN (GLUCOPHAGE) 1000 MG tablet    Sig: Take 1 tablet (1,000 mg total) by mouth 2 (two) times daily with a meal.    Dispense:  180 tablet    Refill:  3  . simvastatin (ZOCOR) 10 MG tablet    Sig: Take 1 tablet (10 mg total) by mouth at bedtime.    Dispense:  90 tablet    Refill:  3  . ferrous sulfate 325 (65 FE) MG tablet    Sig: Take 1 tablet (325 mg total) by mouth 2 (two) times daily with a meal.    Dispense:  180 tablet    Refill:  3  . traZODone (DESYREL) 50 MG tablet    Sig: Take 1 tablet (50 mg total) by mouth at bedtime as needed for sleep.    Dispense:  30 tablet    Refill:  3    Follow-up: Return in about 1 month (around 05/22/2017) for complete physical exam.   Charlott Rakes MD

## 2017-04-25 LAB — CMP14+EGFR
A/G RATIO: 1.2 (ref 1.2–2.2)
ALK PHOS: 72 IU/L (ref 39–117)
ALT: 12 IU/L (ref 0–32)
AST: 12 IU/L (ref 0–40)
Albumin: 3.7 g/dL (ref 3.5–5.5)
BUN/Creatinine Ratio: 10 (ref 9–23)
BUN: 9 mg/dL (ref 6–24)
Bilirubin Total: 0.2 mg/dL (ref 0.0–1.2)
CO2: 23 mmol/L (ref 20–29)
Calcium: 9 mg/dL (ref 8.7–10.2)
Chloride: 101 mmol/L (ref 96–106)
Creatinine, Ser: 0.86 mg/dL (ref 0.57–1.00)
GFR calc Af Amer: 98 mL/min/{1.73_m2} (ref >59)
GFR calc non Af Amer: 85 mL/min/{1.73_m2} (ref >59)
GLOBULIN, TOTAL: 3.1 g/dL (ref 1.5–4.5)
Glucose: 289 mg/dL — ABNORMAL HIGH (ref 65–99)
POTASSIUM: 4.5 mmol/L (ref 3.5–5.2)
SODIUM: 136 mmol/L (ref 134–144)
Total Protein: 6.8 g/dL (ref 6.0–8.5)

## 2017-04-25 LAB — LIPID PANEL
CHOLESTEROL TOTAL: 171 mg/dL (ref 100–199)
Chol/HDL Ratio: 4.2 ratio (ref 0.0–4.4)
HDL: 41 mg/dL (ref >39)
LDL Calculated: 109 mg/dL — ABNORMAL HIGH (ref 0–99)
TRIGLYCERIDES: 104 mg/dL (ref 0–149)
VLDL Cholesterol Cal: 21 mg/dL (ref 5–40)

## 2017-04-25 LAB — MICROALBUMIN / CREATININE URINE RATIO
Creatinine, Urine: 160.7 mg/dL
MICROALB/CREAT RATIO: 53.3 mg/g{creat} — AB (ref 0.0–30.0)
Microalbumin, Urine: 85.7 ug/mL

## 2017-04-25 LAB — HEMOGLOBIN A1C
ESTIMATED AVERAGE GLUCOSE: 283 mg/dL
HEMOGLOBIN A1C: 11.5 % — AB (ref 4.8–5.6)

## 2017-04-26 ENCOUNTER — Other Ambulatory Visit: Payer: Self-pay | Admitting: Family Medicine

## 2017-04-26 DIAGNOSIS — Z794 Long term (current) use of insulin: Principal | ICD-10-CM

## 2017-04-26 DIAGNOSIS — E1142 Type 2 diabetes mellitus with diabetic polyneuropathy: Secondary | ICD-10-CM

## 2017-04-26 MED ORDER — INSULIN GLARGINE 100 UNIT/ML SOLOSTAR PEN: 50.0000 [IU] | PEN_INJECTOR | Freq: Every day | SUBCUTANEOUS | 3 refills | Status: DC

## 2017-05-05 MED FILL — SIMVASTATIN 10 MG TABLET: 10 | 30 days supply | Qty: 30 | Fill #0

## 2017-05-05 MED FILL — FLUoxetine HCL 40 MG CAPS: 40 | 30 days supply | Qty: 30 | Fill #0

## 2017-05-05 MED FILL — GABAPENTIN 300 MG CAPSULE: 300 | 30 days supply | Qty: 90 | Fill #0

## 2017-05-05 MED FILL — metFORMIN HCL 1000 MG TABS: 1000 | 30 days supply | Qty: 60 | Fill #0

## 2017-05-05 MED FILL — LISINOPRIL-HCTZ 20-12.5 MG: 20-12.5 | 30 days supply | Qty: 30 | Fill #0

## 2017-05-05 MED FILL — FERROUS SULFATE 325 MG TAB: 325 (65 FE) | 30 days supply | Qty: 60 | Fill #0

## 2017-05-05 MED FILL — $LANTUS SOLOSTAR 100 UNITS/: 100 | 30 days supply | Qty: 15 | Fill #0

## 2017-05-05 MED FILL — traZODone HCL 50 MG TABS: 50 | 30 days supply | Qty: 30 | Fill #0

## 2017-05-05 MED FILL — METOCLOPRAMIDE 10 MG TABLET: 10 | 30 days supply | Qty: 90 | Fill #0

## 2017-05-09 ENCOUNTER — Emergency Department (HOSPITAL_COMMUNITY): Payer: No Typology Code available for payment source

## 2017-05-09 ENCOUNTER — Emergency Department (HOSPITAL_COMMUNITY)
Admission: EM | Admit: 2017-05-09 | Discharge: 2017-05-09 | Disposition: A | Discharge status: Home / Self Care | Payer: No Typology Code available for payment source | Attending: Emergency Medicine | Admitting: Emergency Medicine

## 2017-05-09 ENCOUNTER — Encounter (HOSPITAL_COMMUNITY): Payer: Self-pay | Admitting: Emergency Medicine

## 2017-05-09 DIAGNOSIS — J069 Acute upper respiratory infection, unspecified: Secondary | ICD-10-CM

## 2017-05-09 DIAGNOSIS — R05 Cough: Secondary | ICD-10-CM | POA: Diagnosis present

## 2017-05-09 DIAGNOSIS — J40 Bronchitis, not specified as acute or chronic: Secondary | ICD-10-CM | POA: Diagnosis not present

## 2017-05-09 LAB — I-STAT TROPONIN, ED: Troponin i, poc: 0 ng/mL (ref 0.00–0.08)

## 2017-05-09 LAB — CBC WITH DIFFERENTIAL/PLATELET
Basophils Absolute: 0 10*3/uL (ref 0.0–0.1)
Basophils Relative: 0 %
Eosinophils Absolute: 0.1 10*3/uL (ref 0.0–0.7)
Eosinophils Relative: 2 %
HCT: 35.2 % — ABNORMAL LOW (ref 36.0–46.0)
Hemoglobin: 12 g/dL (ref 12.0–15.0)
Lymphocytes Relative: 50 %
Lymphs Abs: 3.2 10*3/uL (ref 0.7–4.0)
MCH: 30.7 pg (ref 26.0–34.0)
MCHC: 34.1 g/dL (ref 30.0–36.0)
MCV: 90 fL (ref 78.0–100.0)
Monocytes Absolute: 0.4 10*3/uL (ref 0.1–1.0)
Monocytes Relative: 6 %
Neutro Abs: 2.7 10*3/uL (ref 1.7–7.7)
Neutrophils Relative %: 42 %
Platelets: 350 10*3/uL (ref 150–400)
RBC: 3.91 MIL/uL (ref 3.87–5.11)
RDW: 13 % (ref 11.5–15.5)
WBC: 6.4 10*3/uL (ref 4.0–10.5)

## 2017-05-09 LAB — BASIC METABOLIC PANEL
Anion gap: 9 (ref 5–15)
BUN: 8 mg/dL (ref 6–20)
CO2: 24 mmol/L (ref 22–32)
Calcium: 8.8 mg/dL — ABNORMAL LOW (ref 8.9–10.3)
Chloride: 102 mmol/L (ref 101–111)
Creatinine, Ser: 0.87 mg/dL (ref 0.44–1.00)
GFR calc Af Amer: >60 mL/min (ref >60)
GFR calc non Af Amer: >60 mL/min (ref >60)
Glucose, Bld: 388 mg/dL — ABNORMAL HIGH (ref 65–99)
Potassium: 4.2 mmol/L (ref 3.5–5.1)
Sodium: 135 mmol/L (ref 135–145)

## 2017-05-09 MED ORDER — BENZONATATE 100 MG PO CAPS: 100.0000 mg | ORAL_CAPSULE | Freq: Three times a day (TID) | ORAL | 0 refills | Status: DC

## 2017-05-09 MED ORDER — ALBUTEROL SULFATE HFA 108 (90 BASE) MCG/ACT IN AERS
1.0000 | INHALATION_SPRAY | Freq: Once | RESPIRATORY_TRACT | Status: AC
  Administered 2017-05-09: 1 via RESPIRATORY_TRACT
  Filled 2017-05-09: qty 6.7

## 2017-05-09 MED ORDER — SODIUM CHLORIDE 0.9 % IN NEBU
3.0000 mL | INHALATION_SOLUTION | Freq: Three times a day (TID) | RESPIRATORY_TRACT | Status: DC | PRN
Start: 2017-05-09 — End: 2017-05-09
  Administered 2017-05-09: 3 mL via RESPIRATORY_TRACT
  Filled 2017-05-09 (×2): qty 3

## 2017-05-09 MED ORDER — ALBUTEROL SULFATE HFA 108 (90 BASE) MCG/ACT IN AERS: 1.0000 | INHALATION_SPRAY | Freq: Four times a day (QID) | RESPIRATORY_TRACT | 0 refills | Status: DC | PRN

## 2017-05-09 MED FILL — BENZONATATE 100 MG CAP: 100 | 7 days supply | Qty: 21 | Fill #0

## 2017-05-09 MED FILL — ALBUTEROL SULFATE HFA 108 (: 108 (90 BAS | 25 days supply | Qty: 18 | Fill #0

## 2017-05-09 NOTE — ED Provider Notes (Signed)
[facility] EMERGENCY DEPARTMENT Provider Note   CSN: [id number] Arrival date & time: 05/09/17  0725     History   Chief Complaint Chief Complaint  Patient presents with  . Cough  . Chest Pain    HPI Mariah Miller is a 42 y.o. female past medical history of hypertension, diabetes who presents for evaluation of 1 week of cough, congestion.  Patient reports that she has been having fever, T-max of 103.  She reports that her last fever was approximately 4 days ago.  Has been taking over-the-counter medications for cough and fever relief with minimal improvement.  She reports that the cough is productive of dark white sputum.  No hemoptysis noted.1 week of progressively worsening cough, patient reports that she has had some intermittent chest soreness secondary to cough.  She states that the chest pain only occurs with coughing and does not occur at rest or with exertion. She reports that she will get into a fit of coughing and feel like she cannot catch her breath.  She denies any shortness of breath with sitting, walking or with exertion.  Patient reports that she has been around several sick contacts since onset of symptoms.  Patient reports that she does not smoke.  She denies any history of asthma or COPD.  Patient denies any abdominal pain, nausea/vomiting, leg swelling. She denies any OCP use, recent immobilization, prior history of DVT/PE, recent surgery, leg swelling, or long travel.  The history is provided by the patient.    Past Medical History:  Diagnosis Date  . Diabetes mellitus without complication ([place])   . Hypertension   . Migraine   . Seizures ([place])    self reported    Patient Active Problem List   Diagnosis Date Noted  . Primary insomnia 09/28/2016  . Anxiety and depression 05/26/2016  . OSA (obstructive sleep apnea) 04/30/2015  . Chest pain 04/28/2015  . DM2 (diabetes mellitus, type 2) ([place]) 04/28/2015  . ASCUS with positive high risk HPV  cervical 06/06/2014  . Anemia, iron deficiency 03/17/2014  . Dyslipidemia 03/17/2014  . Essential hypertension 03/17/2014  . Uncontrolled diabetes mellitus ([place]) 06/17/2013  . Hypoglycemia 04/08/2013  . Diabetic neuropathy, painful ([place]) 03/21/2013  . Other and unspecified hyperlipidemia 01/22/2013  . A C DEGENERATION-CHRONIC 10/27/2008  . SHOULDER PAIN 05/01/2008  . IMPINGEMENT SYNDROME 05/01/2008    Past Surgical History:  Procedure Laterality Date  . CHOLECYSTECTOMY    . CYST EXCISION    . SHOULDER OPEN ROTATOR CUFF REPAIR Left   . TONSILLECTOMY    . TUBAL LIGATION      OB History    Gravida Para Term Preterm AB Living   '6 3 3   3 3   '$ SAB TAB Ectopic Multiple Live Births   2       3       Home Medications    Prior to Admission medications   Medication Sig Start Date End Date Taking? Authorizing Provider  albuterol (PROVENTIL HFA;VENTOLIN HFA) 108 (90 Base) MCG/ACT inhaler Inhale 1-2 puffs into the lungs every 6 (six) hours as needed for wheezing or shortness of breath. 05/09/17   Volanda Napoleon, PA-C  aspirin EC 81 MG tablet Take 1 tablet (81 mg total) by mouth daily. 01/22/13   Dhungel, Flonnie Overman, MD  benzonatate (TESSALON) 100 MG capsule Take 1 capsule (100 mg total) by mouth every 8 (eight) hours. 05/09/17   Volanda Napoleon, PA-C  Blood Glucose Monitoring Suppl (TRUE  METRIX METER) w/Device KIT USE AS DIRECTED 05/01/15   Tresa Garter, MD  ferrous sulfate 325 (65 FE) MG tablet Take 1 tablet (325 mg total) by mouth 2 (two) times daily with a meal. 04/24/17   Charlott Rakes, MD  FLUoxetine (PROZAC) 40 MG capsule Take 1 capsule (40 mg total) by mouth daily. 04/24/17   Charlott Rakes, MD  gabapentin (NEURONTIN) 300 MG capsule Take 1 capsule (300 mg total) by mouth 3 (three) times daily. 04/24/17   Charlott Rakes, MD  glucose blood (TRUE METRIX BLOOD GLUCOSE TEST) test strip Use as instructed 10/08/15   Angelica Chessman E, MD  insulin aspart (NOVOLOG) 100 UNIT/ML  injection Inject 7-12 Units into the skin 3 (three) times daily as needed for high blood sugar. 301 - 350 = 12 units, > 350, call or come to clinic 09/28/16   Tresa Garter, MD  Insulin Glargine (LANTUS SOLOSTAR) 100 UNIT/ML Solostar Pen Inject 50 Units into the skin daily. 04/26/17   Charlott Rakes, MD  lisinopril-hydrochlorothiazide (PRINZIDE,ZESTORETIC) 20-12.5 MG tablet Take 1 tablet by mouth daily. 04/24/17   Charlott Rakes, MD  metFORMIN (GLUCOPHAGE) 1000 MG tablet Take 1 tablet (1,000 mg total) by mouth 2 (two) times daily with a meal. 04/24/17   Charlott Rakes, MD  methocarbamol (ROBAXIN) 500 MG tablet Take 1 tablet (500 mg total) by mouth 2 (two) times daily. 03/12/17   Petrucelli, Samantha R, PA-C  metoCLOPramide (REGLAN) 10 MG tablet Take 1 tablet (10 mg total) by mouth 3 (three) times daily before meals. 04/24/17   Charlott Rakes, MD  naproxen (NAPROSYN) 500 MG tablet Take 1 tablet (500 mg total) by mouth 2 (two) times daily. 03/12/17   Petrucelli, Samantha R, PA-C  simvastatin (ZOCOR) 10 MG tablet Take 1 tablet (10 mg total) by mouth at bedtime. 04/24/17   Charlott Rakes, MD  traZODone (DESYREL) 50 MG tablet Take 1 tablet (50 mg total) by mouth at bedtime as needed for sleep. 04/24/17   Charlott Rakes, MD  TRUEPLUS LANCETS 28G MISC USE AS INSTRUCTED 05/01/15   Tresa Garter, MD    Family History Family History  Problem Relation Age of Onset  . Hypertension Mother   . Thyroid disease Mother   . Irregular heart beat Mother   . Bipolar disorder Sister     Social History Social History   Tobacco Use  . Smoking status: Former Research scientist (life sciences)  . Smokeless tobacco: Never Used  . Tobacco comment: Smoked for 4 years, quit age 67  Substance Use Topics  . Alcohol use: Yes    Alcohol/week: 0.0 oz    Comment: Holidays only  . Drug use: No     Allergies   Tomato   Review of Systems Review of Systems  Constitutional: Positive for fever. Negative for chills.  HENT: Positive for  congestion.   Eyes: Negative for visual disturbance.  Respiratory: Positive for cough. Negative for shortness of breath.   Cardiovascular: Positive for chest pain. Negative for leg swelling.  Gastrointestinal: Negative for abdominal pain, diarrhea, nausea and vomiting.  Genitourinary: Negative for dysuria and hematuria.  Skin: Negative for rash.  Neurological: Negative for dizziness, weakness, numbness and headaches.  All other systems reviewed and are negative.    Physical Exam Updated Vital Signs BP (!) 151/90 (BP Location: Right Arm) Comment: Simultaneous filing. User may not have seen previous data.  Pulse 86 Comment: Simultaneous filing. User may not have seen previous data.  Temp 98.8 F (37.1 C) (Oral)   Resp  18 Comment: Simultaneous filing. User may not have seen previous data.  Ht '5\' 5"'$  (1.651 m)   Wt 97.5 kg (215 lb)   LMP 05/04/2017   SpO2 97% Comment: Simultaneous filing. User may not have seen previous data.  BMI 35.78 kg/m   Physical Exam  Constitutional: She is oriented to person, place, and time. She appears well-developed and well-nourished.  Intermittently coughing throughout exam  HENT:  Head: Normocephalic and atraumatic.  Nose: Mucosal edema present.  Mouth/Throat: Oropharynx is clear and moist and mucous membranes are normal.  Eyes: Conjunctivae, EOM and lids are normal. Pupils are equal, round, and reactive to light.  Neck: Full passive range of motion without pain.  Cardiovascular: Normal rate, regular rhythm, normal heart sounds and normal pulses. Exam reveals no gallop and no friction rub.  No murmur heard. Pulmonary/Chest: Effort normal and breath sounds normal.  No evidence of respiratory distress. Able to speak in full sentences without difficulty.  Lungs clear to auscultation bilaterally.  Abdominal: Soft. Normal appearance. There is no tenderness. There is no rigidity and no guarding.  Musculoskeletal: Normal range of motion.  Bilateral lower  extremities are symmetric in appearance.  No edema noted.  Neurological: She is alert and oriented to person, place, and time.  Skin: Skin is warm and dry. Capillary refill takes less than 2 seconds.  Psychiatric: She has a normal mood and affect. Her speech is normal.  Nursing note and vitals reviewed.    ED Treatments / Results  Labs (all labs ordered are listed, but only abnormal results are displayed) Labs Reviewed  BASIC METABOLIC PANEL - Abnormal; Notable for the following components:      Result Value   Glucose, Bld 388 (*)    Calcium 8.8 (*)    All other components within normal limits  CBC WITH DIFFERENTIAL/PLATELET - Abnormal; Notable for the following components:   HCT 35.2 (*)    All other components within normal limits  I-STAT TROPONIN, ED    EKG  EKG Interpretation  Date/Time:  Tuesday May 09 2017 07:33:53 EST Ventricular Rate:  89 PR Interval:    QRS Duration: 87 QT Interval:  356 QTC Calculation: 434 R Axis:   53 Text Interpretation:  Sinus rhythm Since prior ECG, TW change in lead III more pronounced, however noted to be present on prior ECG, no other signifiacnt changes Confirmed by Gareth Morgan [phone]) on 05/09/2017 8:59:59 AM Also confirmed by Gareth Morgan [phone]), editor Philomena Doheny [phone])  on 05/09/2017 9:27:55 AM       Radiology Dg Chest 2 View  Result Date: 05/09/2017 CLINICAL DATA:  42 year old female with cough fever and shortness of breath for 1 week. EXAM: CHEST  2 VIEW COMPARISON:  04/28/2015 and earlier. FINDINGS: Lung volumes are stable, lower limits of normal. Mediastinal contours remain normal. Visualized tracheal air column is within normal limits. Both lungs appear clear. No pneumothorax or pleural effusion. Stable cholecystectomy clips. Negative visible bowel gas pattern. No acute osseous abnormality identified. IMPRESSION: Negative.  No acute cardiopulmonary abnormality. Electronically Signed   By: Genevie Ann M.D.   On: 05/09/2017  08:26    Procedures Procedures (including critical care time)  Medications Ordered in ED Medications  albuterol (PROVENTIL HFA;VENTOLIN HFA) 108 (90 Base) MCG/ACT inhaler 1 puff (1 puff Inhalation Given 05/09/17 0914)     Initial Impression / Assessment and Plan / ED Course  I have reviewed the triage vital signs and the nursing notes.  Pertinent labs & imaging results  that were available during my care of the patient were reviewed by me and considered in my medical decision making (see chart for details).     42 year old female past medical history of hypertension, diabetes who presents for evaluation of 1 week of progressively worsening cough, congestion.  Has some associated chest soreness that only occurs with coughing.  No chest pain with exertion.  Triage note mentions shortness of breath but patient states that she is not having dyspnea on exertion, difficulty breathing.  She only reports difficulty breathing when she starts coughing. Has tried over-the-counter cough medication with no improvement.  Has had several sick contacts.  On exam, lungs clear to auscultation bilaterally.  Legs are symmetric in appearance.  No evidence of edema.  Consider pneumonia versus influenza versus URI.  Low suspicion for ACS etiology given history/physical exam. Patient is PERC negative and is low risk for PE. Plan to check basic labs, chest x-ray, EKG.  We will try nebulized saline here in the ED to help with coughing.  Troponin negative. CBC unremarkable.  BMP shows hyperglycemia but otherwise no acute abnormalities.  Repeat vitals show oxygen saturation is greater than 95% on room air.  No evidence of respiratory distress.  No evidence of tachycardia. Given reassuring physical/history and reassuring vital signs, do not suspect PE at this time. Chest x-ray negative for pneumonia.  I discussed results with patient. Patient reports improvement in cough after nebulized saline. I suspect the symptoms are  likely secondary to upper respiratory infection with bronchitis.  We will plan to give albuterol inhaler for bronchospasm.  Will plan to give symptomatic relief.  Patient instructed to follow-up with primary care doctor next 24-48 hours for further evaluation. Patient had ample opportunity for questions and discussion. All patient's questions were answered with full understanding. Strict return precautions discussed. Patient expresses understanding and agreement to plan.   Final Clinical Impressions(s) / ED Diagnoses   Final diagnoses:  Upper respiratory tract infection, unspecified type  Bronchitis    ED Discharge Orders        Ordered    benzonatate (TESSALON) 100 MG capsule  Every 8 hours     05/09/17 0901    albuterol (PROVENTIL HFA;VENTOLIN HFA) 108 (90 Base) MCG/ACT inhaler  Every 6 hours PRN     05/09/17 0901       Volanda Napoleon, PA-C 05/09/17 1823    Gareth Morgan, MD 05/11/17 1311

## 2017-05-09 NOTE — ED Triage Notes (Signed)
Patient complaining of a  Productive cough for over a week with dark white sputum. Reports upper mid chest pain 8/10 when coughing or taking a deep breath, and shortness of breath.

## 2017-05-09 NOTE — Discharge Instructions (Signed)
Take Tessalon Perles as directed.  Use albuterol inhaler as directed.   You can take Tylenol or Ibuprofen as directed for pain. You can alternate Tylenol and Ibuprofen every 4 hours. If you take Tylenol at 1pm, then you can take Ibuprofen at 5pm. Then you can take Tylenol again at 9pm.   Follow-up with your primary care doctor the next 2-4 days for further evaluation.  As we discussed, return to the emergency department for any worsening fever, chest pain, difficulty breathing, abdominal pain, nausea/vomiting or any other worsening or concerning symptoms.

## 2017-05-11 ENCOUNTER — Telehealth: Payer: Self-pay | Admitting: Family Medicine

## 2017-05-11 NOTE — Telephone Encounter (Signed)
Pt came to drop of FMLA paperwoork,Placed in PCP box

## 2017-05-26 ENCOUNTER — Encounter: Payer: Self-pay | Admitting: Family Medicine

## 2017-05-26 ENCOUNTER — Ambulatory Visit: Payer: PRIVATE HEALTH INSURANCE | Attending: Family Medicine | Admitting: Family Medicine

## 2017-05-26 VITALS — BP 151/77 | HR 82 | Temp 98.7°F | Ht 65.0 in | Wt 219.6 lb

## 2017-05-26 DIAGNOSIS — E1142 Type 2 diabetes mellitus with diabetic polyneuropathy: Secondary | ICD-10-CM | POA: Diagnosis not present

## 2017-05-26 DIAGNOSIS — Z23 Encounter for immunization: Secondary | ICD-10-CM | POA: Diagnosis not present

## 2017-05-26 DIAGNOSIS — L308 Other specified dermatitis: Secondary | ICD-10-CM | POA: Diagnosis not present

## 2017-05-26 DIAGNOSIS — Z124 Encounter for screening for malignant neoplasm of cervix: Secondary | ICD-10-CM

## 2017-05-26 DIAGNOSIS — Z1231 Encounter for screening mammogram for malignant neoplasm of breast: Secondary | ICD-10-CM

## 2017-05-26 DIAGNOSIS — Z0000 Encounter for general adult medical examination without abnormal findings: Secondary | ICD-10-CM | POA: Diagnosis not present

## 2017-05-26 DIAGNOSIS — Z79899 Other long term (current) drug therapy: Secondary | ICD-10-CM | POA: Insufficient documentation

## 2017-05-26 DIAGNOSIS — Z794 Long term (current) use of insulin: Secondary | ICD-10-CM | POA: Insufficient documentation

## 2017-05-26 DIAGNOSIS — Z1239 Encounter for other screening for malignant neoplasm of breast: Secondary | ICD-10-CM

## 2017-05-26 DIAGNOSIS — L309 Dermatitis, unspecified: Secondary | ICD-10-CM | POA: Insufficient documentation

## 2017-05-26 DIAGNOSIS — G43909 Migraine, unspecified, not intractable, without status migrainosus: Secondary | ICD-10-CM | POA: Diagnosis not present

## 2017-05-26 DIAGNOSIS — Z7982 Long term (current) use of aspirin: Secondary | ICD-10-CM | POA: Diagnosis not present

## 2017-05-26 DIAGNOSIS — I10 Essential (primary) hypertension: Secondary | ICD-10-CM | POA: Diagnosis not present

## 2017-05-26 LAB — GLUCOSE, POCT (MANUAL RESULT ENTRY): POC Glucose: 344 mg/dl — AB (ref 70–99)

## 2017-05-26 MED ORDER — TETANUS-DIPHTH-ACELL PERTUSSIS 5-2.5-18.5 LF-MCG/0.5 IM SUSP: 0.5000 mL | Freq: Once | INTRAMUSCULAR | 0 refills | Status: AC

## 2017-05-26 MED ORDER — LISINOPRIL-HYDROCHLOROTHIAZIDE 20-12.5 MG PO TABS: 2.0000 | ORAL_TABLET | Freq: Every day | ORAL | 3 refills | Status: DC

## 2017-05-26 MED ORDER — TRIAMCINOLONE ACETONIDE 0.1 % EX CREA: 1.0000 "application " | TOPICAL_CREAM | Freq: Two times a day (BID) | CUTANEOUS | 1 refills | Status: DC

## 2017-05-26 MED FILL — BOOSTRIX VACCINE SYRINGE: 5-2.5-18.5 | 1 days supply | Qty: 1 | Fill #0

## 2017-05-26 NOTE — Patient Instructions (Signed)

## 2017-05-26 NOTE — Progress Notes (Signed)
Subjective:  Patient ID: Mariah Miller, female    DOB: 08-26-1975  Age: 42 y.o. MRN: [id number]  CC: Annual Exam   HPI Mariah Miller presents for complete physical exam. She complains of a dry pruritic rash in between both breast and on her neck from her eczema and is requesting a cream for this  Past Medical History:  Diagnosis Date  . Diabetes mellitus without complication ([place])   . Hypertension   . Migraine   . Seizures ([place])    self reported    Past Surgical History:  Procedure Laterality Date  . CHOLECYSTECTOMY    . CYST EXCISION    . SHOULDER OPEN ROTATOR CUFF REPAIR Left   . TONSILLECTOMY    . TUBAL LIGATION      Allergies  Allergen Reactions  . Tomato Itching and Rash      Outpatient Medications Prior to Visit  Medication Sig Dispense Refill  . albuterol (PROVENTIL HFA;VENTOLIN HFA) 108 (90 Base) MCG/ACT inhaler Inhale 1-2 puffs into the lungs every 6 (six) hours as needed for wheezing or shortness of breath. 1 Inhaler 0  . aspirin EC 81 MG tablet Take 1 tablet (81 mg total) by mouth daily. 30 tablet 5  . Blood Glucose Monitoring Suppl (TRUE METRIX METER) w/Device KIT USE AS DIRECTED 1 kit 0  . ferrous sulfate 325 (65 FE) MG tablet Take 1 tablet (325 mg total) by mouth 2 (two) times daily with a meal. 180 tablet 3  . FLUoxetine (PROZAC) 40 MG capsule Take 1 capsule (40 mg total) by mouth daily. 30 capsule 3  . gabapentin (NEURONTIN) 300 MG capsule Take 1 capsule (300 mg total) by mouth 3 (three) times daily. 90 capsule 3  . glucose blood (TRUE METRIX BLOOD GLUCOSE TEST) test strip Use as instructed 100 each 12  . Insulin Glargine (LANTUS SOLOSTAR) 100 UNIT/ML Solostar Pen Inject 50 Units into the skin daily. 5 pen 3  . metFORMIN (GLUCOPHAGE) 1000 MG tablet Take 1 tablet (1,000 mg total) by mouth 2 (two) times daily with a meal. 180 tablet 3  . methocarbamol (ROBAXIN) 500 MG tablet Take 1 tablet (500 mg total) by mouth 2 (two) times daily. 20 tablet 0    . metoCLOPramide (REGLAN) 10 MG tablet Take 1 tablet (10 mg total) by mouth 3 (three) times daily before meals. 90 tablet 1  . naproxen (NAPROSYN) 500 MG tablet Take 1 tablet (500 mg total) by mouth 2 (two) times daily. 30 tablet 0  . simvastatin (ZOCOR) 10 MG tablet Take 1 tablet (10 mg total) by mouth at bedtime. 90 tablet 3  . traZODone (DESYREL) 50 MG tablet Take 1 tablet (50 mg total) by mouth at bedtime as needed for sleep. 30 tablet 3  . TRUEPLUS LANCETS 28G MISC USE AS INSTRUCTED 100 each 5  . lisinopril-hydrochlorothiazide (PRINZIDE,ZESTORETIC) 20-12.5 MG tablet Take 1 tablet by mouth daily. 30 tablet 3  . benzonatate (TESSALON) 100 MG capsule Take 1 capsule (100 mg total) by mouth every 8 (eight) hours. (Patient not taking: Reported on 05/26/2017) 21 capsule 0  . insulin aspart (NOVOLOG) 100 UNIT/ML injection Inject 7-12 Units into the skin 3 (three) times daily as needed for high blood sugar. 301 - 350 = 12 units, > 350, call or come to clinic (Patient not taking: Reported on 05/26/2017) 10 mL 3   No facility-administered medications prior to visit.     ROS Review of Systems  Constitutional: Negative for activity change, appetite change and  fatigue.  HENT: Negative for congestion, sinus pressure and sore throat.   Eyes: Negative for visual disturbance.  Respiratory: Negative for cough, chest tightness, shortness of breath and wheezing.   Cardiovascular: Negative for chest pain and palpitations.  Gastrointestinal: Negative for abdominal distention, abdominal pain and constipation.  Endocrine: Negative for polydipsia.  Genitourinary: Negative for dysuria and frequency.  Musculoskeletal: Negative for arthralgias and back pain.  Skin: Positive for rash.  Neurological: Negative for tremors, light-headedness and numbness.  Hematological: Does not bruise/bleed easily.  Psychiatric/Behavioral: Negative for agitation and behavioral problems.    Objective:  BP (!) 151/77   Pulse 82    Temp 98.7 F (37.1 C) (Oral)   Ht _0  (1.651 m)   Wt 219 lb 9.6 oz (99.6 kg)   LMP 05/04/2017   SpO2 100%   BMI 36.54 kg/m   BP/Weight 05/26/2017 05/09/2017 10/15/9145  Systolic BP 829 562 130  Diastolic BP 77 90 88  Wt. (Lbs) 219.6 215 215.4  BMI 36.54 35.78 35.84      Physical Exam  Constitutional: She is oriented to person, place, and time. She appears well-developed and well-nourished. No distress.  HENT:  Head: Normocephalic.  Right Ear: External ear normal.  Left Ear: External ear normal.  Nose: Nose normal.  Mouth/Throat: Oropharynx is clear and moist.  Eyes: Pupils are equal, round, and reactive to light. Conjunctivae and EOM are normal.  Neck: Normal range of motion. No JVD present.  Cardiovascular: Normal rate, regular rhythm, normal heart sounds and intact distal pulses. Exam reveals no gallop.  No murmur heard. Pulmonary/Chest: Effort normal and breath sounds normal. No respiratory distress. She has no wheezes. She has no rales. She exhibits no tenderness.  Abdominal: Soft. Bowel sounds are normal. She exhibits no distension and no mass. There is no tenderness.  Genitourinary:  Genitourinary Comments: External genitalia, vagina, cervix, adnexa - normal  Musculoskeletal: Normal range of motion. She exhibits no edema or tenderness.  Neurological: She is alert and oriented to person, place, and time. She has normal reflexes.  Skin: Skin is warm and dry. Rash (scaly rash in between breasts) noted. She is not diaphoretic.  Psychiatric: She has a normal mood and affect.    Lab Results  Component Value Date   HGBA1C 11.5 (H) 04/24/2017    Assessment & Plan:   1. Annual physical exam - Cytology - PAP([place])  2. Type 2 diabetes mellitus with diabetic polyneuropathy, with long-term current use of insulin ([facility]) Uncontrolled with A1c of 11.5; diabetic regimen adjusted at last visit. Continue diabetic diet - POCT glucose (manual entry)  3. Essential  hypertension Uncontrolled Increased dose of lisinopril/HCTZ - lisinopril-hydrochlorothiazide (PRINZIDE,ZESTORETIC) 20-12.5 MG tablet; Take 2 tablets by mouth daily.  Dispense: 60 tablet; Refill: 3  4. Screening for breast cancer - MM Digital Screening; Future  5. Screening for cervical cancer   6. Need for Tdap vaccination Tdap administered  7. Other eczema Placed on Triamcinolone   Meds ordered this encounter  Medications  . lisinopril-hydrochlorothiazide (PRINZIDE,ZESTORETIC) 20-12.5 MG tablet    Sig: Take 2 tablets by mouth daily.    Dispense:  60 tablet    Refill:  3    Discontinue previous dose  . triamcinolone cream (KENALOG) 0.1 %    Sig: Apply 1 application topically 2 (two) times daily.    Dispense:  45 g    Refill:  1  . Tdap (BOOSTRIX) 5-2.5-18.5 LF-MCG/0.5 injection    Sig: Inject 0.5 mLs into the muscle  once for 1 dose.    Dispense:  0.5 mL    Refill:  0    Follow-up: Return in about 3 months (around 08/26/2017) for follow up of chronic medical conditions.   Charlott Rakes MD

## 2017-05-30 LAB — CYTOLOGY - PAP
CANDIDA VAGINITIS: NEGATIVE
CHLAMYDIA, DNA PROBE: NEGATIVE
DIAGNOSIS: NEGATIVE
Neisseria Gonorrhea: NEGATIVE
Trichomonas: NEGATIVE

## 2017-06-01 ENCOUNTER — Telehealth: Payer: Self-pay

## 2017-06-01 LAB — CERVICOVAGINAL ANCILLARY ONLY: Herpes: NEGATIVE

## 2017-06-01 NOTE — Telephone Encounter (Signed)
Patient called regarding her PAP results

## 2017-06-01 NOTE — Telephone Encounter (Signed)
Patient was called and informed to contact office for her Pap smear results.   If patient returns phone call please inform patient that pap smear is normal.

## 2017-07-03 ENCOUNTER — Other Ambulatory Visit: Payer: Self-pay | Admitting: Family Medicine

## 2017-07-03 DIAGNOSIS — Z1231 Encounter for screening mammogram for malignant neoplasm of breast: Secondary | ICD-10-CM

## 2017-08-01 ENCOUNTER — Encounter (HOSPITAL_COMMUNITY): Payer: Self-pay | Admitting: Emergency Medicine

## 2017-08-01 ENCOUNTER — Other Ambulatory Visit: Payer: Self-pay

## 2017-08-01 ENCOUNTER — Emergency Department (HOSPITAL_COMMUNITY)
Admission: EM | Admit: 2017-08-01 | Discharge: 2017-08-01 | Disposition: A | Discharge status: Home / Self Care | Payer: No Typology Code available for payment source | Attending: Emergency Medicine | Admitting: Emergency Medicine

## 2017-08-01 DIAGNOSIS — Z7982 Long term (current) use of aspirin: Secondary | ICD-10-CM | POA: Diagnosis not present

## 2017-08-01 DIAGNOSIS — M545 Low back pain, unspecified: Secondary | ICD-10-CM

## 2017-08-01 DIAGNOSIS — Z79899 Other long term (current) drug therapy: Secondary | ICD-10-CM | POA: Insufficient documentation

## 2017-08-01 DIAGNOSIS — Z87891 Personal history of nicotine dependence: Secondary | ICD-10-CM | POA: Insufficient documentation

## 2017-08-01 DIAGNOSIS — E1140 Type 2 diabetes mellitus with diabetic neuropathy, unspecified: Secondary | ICD-10-CM | POA: Insufficient documentation

## 2017-08-01 DIAGNOSIS — R202 Paresthesia of skin: Secondary | ICD-10-CM | POA: Diagnosis not present

## 2017-08-01 DIAGNOSIS — I10 Essential (primary) hypertension: Secondary | ICD-10-CM | POA: Insufficient documentation

## 2017-08-01 DIAGNOSIS — Z794 Long term (current) use of insulin: Secondary | ICD-10-CM | POA: Diagnosis not present

## 2017-08-01 MED ORDER — OXYCODONE-ACETAMINOPHEN 5-325 MG PO TABS
1.0000 | ORAL_TABLET | ORAL | Status: DC | PRN
  Administered 2017-08-01: 1 via ORAL
  Filled 2017-08-01: qty 1

## 2017-08-01 MED ORDER — METHOCARBAMOL 500 MG PO TABS: 500.0000 mg | ORAL_TABLET | Freq: Two times a day (BID) | ORAL | 0 refills | Status: DC

## 2017-08-01 NOTE — ED Provider Notes (Signed)
[facility] EMERGENCY DEPARTMENT Provider Note   CSN: [id number] Arrival date & time: 08/01/17  0945   History   Chief Complaint Chief Complaint  Patient presents with  . Back Pain  . Leg Pain    HPI Mariah Miller is a 42 y.o. female.  HPI   42 year old female presents today with complaints of low back pain.  Patient notes proximal me 10 days ago she was setting up for a barbecue.  She notes at that time she was having minor lower back pain.  She reports waking up the next day with severe low back pain.  She notes it has persisted since that time.  She notes tingling in her right leg after she sits for prolonged periods of time no loss of distal sensation strength or motor function.  Patient denies any fever, abdominal pain, IV drug use, neurological deficits, or any other red flags.  Patient notes using over-the-counter medications with very minimal provement in symptoms.    Past Medical History:  Diagnosis Date  . Diabetes mellitus without complication ([place])   . Hypertension   . Migraine   . Seizures ([place])    self reported    Patient Active Problem List   Diagnosis Date Noted  . Eczema 05/26/2017  . Primary insomnia 09/28/2016  . Anxiety and depression 05/26/2016  . OSA (obstructive sleep apnea) 04/30/2015  . Chest pain 04/28/2015  . DM2 (diabetes mellitus, type 2) ([place]) 04/28/2015  . ASCUS with positive high risk HPV cervical 06/06/2014  . Anemia, iron deficiency 03/17/2014  . Dyslipidemia 03/17/2014  . Essential hypertension 03/17/2014  . Uncontrolled diabetes mellitus ([place]) 06/17/2013  . Hypoglycemia 04/08/2013  . Diabetic neuropathy, painful ([place]) 03/21/2013  . Other and unspecified hyperlipidemia 01/22/2013  . A C DEGENERATION-CHRONIC 10/27/2008  . SHOULDER PAIN 05/01/2008  . IMPINGEMENT SYNDROME 05/01/2008    Past Surgical History:  Procedure Laterality Date  . CHOLECYSTECTOMY    . CYST EXCISION    . SHOULDER OPEN ROTATOR CUFF  REPAIR Left   . TONSILLECTOMY    . TUBAL LIGATION       OB History    Gravida  6   Para  3   Term  3   Preterm      AB  3   Living  3     SAB  2   TAB      Ectopic      Multiple      Live Births  3            Home Medications    Prior to Admission medications   Medication Sig Start Date End Date Taking? Authorizing Provider  albuterol (PROVENTIL HFA;VENTOLIN HFA) 108 (90 Base) MCG/ACT inhaler Inhale 1-2 puffs into the lungs every 6 (six) hours as needed for wheezing or shortness of breath. 05/09/17   Volanda Napoleon, PA-C  aspirin EC 81 MG tablet Take 1 tablet (81 mg total) by mouth daily. 01/22/13   Dhungel, Flonnie Overman, MD  benzonatate (TESSALON) 100 MG capsule Take 1 capsule (100 mg total) by mouth every 8 (eight) hours. Patient not taking: Reported on 05/26/2017 05/09/17   [place] Lanius A, PA-C  Blood Glucose Monitoring Suppl (TRUE METRIX METER) w/Device KIT USE AS DIRECTED 05/01/15   Tresa Garter, MD  ferrous sulfate 325 (65 FE) MG tablet Take 1 tablet (325 mg total) by mouth 2 (two) times daily with a meal. 04/24/17   Charlott Rakes, MD  FLUoxetine (PROZAC) 40  MG capsule Take 1 capsule (40 mg total) by mouth daily. 04/24/17   Charlott Rakes, MD  gabapentin (NEURONTIN) 300 MG capsule Take 1 capsule (300 mg total) by mouth 3 (three) times daily. 04/24/17   Charlott Rakes, MD  glucose blood (TRUE METRIX BLOOD GLUCOSE TEST) test strip Use as instructed 10/08/15   Angelica Chessman E, MD  insulin aspart (NOVOLOG) 100 UNIT/ML injection Inject 7-12 Units into the skin 3 (three) times daily as needed for high blood sugar. 301 - 350 = 12 units, > 350, call or come to clinic Patient not taking: Reported on 05/26/2017 09/28/16   Tresa Garter, MD  Insulin Glargine (LANTUS SOLOSTAR) 100 UNIT/ML Solostar Pen Inject 50 Units into the skin daily. 04/26/17   Charlott Rakes, MD  lisinopril-hydrochlorothiazide (PRINZIDE,ZESTORETIC) 20-12.5 MG tablet Take 2 tablets by  mouth daily. 05/26/17   Charlott Rakes, MD  metFORMIN (GLUCOPHAGE) 1000 MG tablet Take 1 tablet (1,000 mg total) by mouth 2 (two) times daily with a meal. 04/24/17   Charlott Rakes, MD  methocarbamol (ROBAXIN) 500 MG tablet Take 1 tablet (500 mg total) by mouth 2 (two) times daily. 08/01/17   Hedges, Dellis Filbert, PA-C  metoCLOPramide (REGLAN) 10 MG tablet Take 1 tablet (10 mg total) by mouth 3 (three) times daily before meals. 04/24/17   Charlott Rakes, MD  naproxen (NAPROSYN) 500 MG tablet Take 1 tablet (500 mg total) by mouth 2 (two) times daily. 03/12/17   Petrucelli, Samantha R, PA-C  simvastatin (ZOCOR) 10 MG tablet Take 1 tablet (10 mg total) by mouth at bedtime. 04/24/17   Charlott Rakes, MD  traZODone (DESYREL) 50 MG tablet Take 1 tablet (50 mg total) by mouth at bedtime as needed for sleep. 04/24/17   Charlott Rakes, MD  triamcinolone cream (KENALOG) 0.1 % Apply 1 application topically 2 (two) times daily. 05/26/17   Charlott Rakes, MD  TRUEPLUS LANCETS 28G MISC USE AS INSTRUCTED 05/01/15   Tresa Garter, MD    Family History Family History  Problem Relation Age of Onset  . Hypertension Mother   . Thyroid disease Mother   . Irregular heart beat Mother   . Bipolar disorder Sister     Social History Social History   Tobacco Use  . Smoking status: Former Research scientist (life sciences)  . Smokeless tobacco: Never Used  . Tobacco comment: Smoked for 4 years, quit age 24  Substance Use Topics  . Alcohol use: Yes    Alcohol/week: 0.0 oz    Comment: Holidays only  . Drug use: No     Allergies   Tomato   Review of Systems Review of Systems  All other systems reviewed and are negative.    Physical Exam Updated Vital Signs BP (!) 148/80 (BP Location: Right Arm)   Pulse 73   Temp 98.9 F (37.2 C) (Oral)   Resp 16   Ht 5' 5" (1.651 m)   Wt 99.3 kg (219 lb)   SpO2 100%   BMI 36.44 kg/m   Physical Exam  Constitutional: She is oriented to person, place, and time. She appears well-developed  and well-nourished.  HENT:  Head: Normocephalic and atraumatic.  Eyes: Pupils are equal, round, and reactive to light. Conjunctivae are normal. Right eye exhibits no discharge. Left eye exhibits no discharge. No scleral icterus.  Neck: Normal range of motion. No JVD present. No tracheal deviation present.  Pulmonary/Chest: Effort normal. No stridor.  Musculoskeletal:  No C or T-spine tenderness palpation, diffuse tenderness to the lumbar spine and surrounding  soft tissue worse on the right, no redness swelling, distal sensation strength and motor function intact, patellar reflexes 2+ bilateral  Neurological: She is alert and oriented to person, place, and time. Coordination normal.  Psychiatric: She has a normal mood and affect. Her behavior is normal. Judgment and thought content normal.  Nursing note and vitals reviewed.    ED Treatments / Results  Labs (all labs ordered are listed, but only abnormal results are displayed) Labs Reviewed - No data to display  EKG None  Radiology No results found.  Procedures Procedures (including critical care time)  Medications Ordered in ED Medications  oxyCODONE-acetaminophen (PERCOCET/ROXICET) 5-325 MG per tablet 1 tablet (1 tablet Oral Given 08/01/17 1106)     Initial Impression / Assessment and Plan / ED Course  I have reviewed the triage vital signs and the nursing notes.  Pertinent labs & imaging results that were available during my care of the patient were reviewed by me and considered in my medical decision making (see chart for details).     42 year old female presents today with unconjugated back pain, she has no red flags.  Patient will be discharged with anti-inflammatories and muscle relaxers, she is given strict return precautions, she verbalized understanding and agreement to today's plan and had no further questions or concerns at the time discharge.  Final Clinical Impressions(s) / ED Diagnoses   Final diagnoses:    Acute bilateral low back pain without sciatica    ED Discharge Orders        Ordered    methocarbamol (ROBAXIN) 500 MG tablet  2 times daily     08/01/17 1340       Okey Regal, PA-C 08/01/17 1346    Valarie Merino, MD 08/01/17 2252

## 2017-08-01 NOTE — ED Notes (Signed)
ED Provider at bedside. 

## 2017-08-01 NOTE — ED Triage Notes (Signed)
Onset 2-3 days ago lower back pain and recently radiating to right lower extremity. Pain currently 7/10 achy sharp.

## 2017-08-01 NOTE — ED Notes (Signed)
Patient verbalizes understanding of discharge instructions. Opportunity for questioning and answers were provided. Armband removed by staff, pt discharged from ED ambulatory.   

## 2017-08-01 NOTE — Discharge Instructions (Addendum)
Please read attached information. If you experience any new or worsening signs or symptoms please return to the emergency room for evaluation. Please follow-up with your primary care provider or specialist as discussed. Please use medication prescribed only as directed and discontinue taking if you have any concerning signs or symptoms.   °

## 2017-08-08 MED FILL — $LANTUS SOLOSTAR 100 UNITS/: 100 | 30 days supply | Qty: 15 | Fill #1

## 2017-08-08 MED FILL — ?HUMALOG 100 UNITS/ML VIAL: 100 | 27 days supply | Qty: 10 | Fill #2

## 2017-08-28 ENCOUNTER — Ambulatory Visit: Payer: PRIVATE HEALTH INSURANCE | Admitting: Family Medicine

## 2017-09-05 ENCOUNTER — Ambulatory Visit: Payer: PRIVATE HEALTH INSURANCE | Admitting: Family Medicine

## 2017-10-05 ENCOUNTER — Emergency Department (HOSPITAL_COMMUNITY)
Admission: EM | Admit: 2017-10-05 | Discharge: 2017-10-05 | Disposition: A | Discharge status: Home / Self Care | Payer: No Typology Code available for payment source | Attending: Emergency Medicine | Admitting: Emergency Medicine

## 2017-10-05 ENCOUNTER — Encounter (HOSPITAL_COMMUNITY): Payer: Self-pay

## 2017-10-05 DIAGNOSIS — I10 Essential (primary) hypertension: Secondary | ICD-10-CM | POA: Insufficient documentation

## 2017-10-05 DIAGNOSIS — Z79899 Other long term (current) drug therapy: Secondary | ICD-10-CM | POA: Insufficient documentation

## 2017-10-05 DIAGNOSIS — E1165 Type 2 diabetes mellitus with hyperglycemia: Secondary | ICD-10-CM | POA: Insufficient documentation

## 2017-10-05 DIAGNOSIS — Z87891 Personal history of nicotine dependence: Secondary | ICD-10-CM | POA: Insufficient documentation

## 2017-10-05 DIAGNOSIS — R739 Hyperglycemia, unspecified: Secondary | ICD-10-CM

## 2017-10-05 DIAGNOSIS — Z794 Long term (current) use of insulin: Secondary | ICD-10-CM | POA: Insufficient documentation

## 2017-10-05 DIAGNOSIS — Z7982 Long term (current) use of aspirin: Secondary | ICD-10-CM | POA: Insufficient documentation

## 2017-10-05 LAB — URINALYSIS, ROUTINE W REFLEX MICROSCOPIC
Bacteria, UA: NONE SEEN
Bilirubin Urine: NEGATIVE
Glucose, UA: >=500 mg/dL — AB
Ketones, ur: NEGATIVE mg/dL
Leukocytes, UA: NEGATIVE
Nitrite: NEGATIVE
Protein, ur: NEGATIVE mg/dL
Specific Gravity, Urine: 1.035 — ABNORMAL HIGH (ref 1.005–1.030)
pH: 6 (ref 5.0–8.0)

## 2017-10-05 LAB — CBC WITH DIFFERENTIAL/PLATELET
Abs Immature Granulocytes: 0 10*3/uL (ref 0.0–0.1)
Basophils Absolute: 0 10*3/uL (ref 0.0–0.1)
Basophils Relative: 1 %
Eosinophils Absolute: 0.1 10*3/uL (ref 0.0–0.7)
Eosinophils Relative: 2 %
HCT: 36.6 % (ref 36.0–46.0)
Hemoglobin: 12.1 g/dL (ref 12.0–15.0)
Immature Granulocytes: 0 %
Lymphocytes Relative: 43 %
Lymphs Abs: 2.4 10*3/uL (ref 0.7–4.0)
MCH: 30 pg (ref 26.0–34.0)
MCHC: 33.1 g/dL (ref 30.0–36.0)
MCV: 90.6 fL (ref 78.0–100.0)
Monocytes Absolute: 0.5 10*3/uL (ref 0.1–1.0)
Monocytes Relative: 10 %
Neutro Abs: 2.5 10*3/uL (ref 1.7–7.7)
Neutrophils Relative %: 44 %
Platelets: 329 10*3/uL (ref 150–400)
RBC: 4.04 MIL/uL (ref 3.87–5.11)
RDW: 13 % (ref 11.5–15.5)
WBC: 5.6 10*3/uL (ref 4.0–10.5)

## 2017-10-05 LAB — COMPREHENSIVE METABOLIC PANEL
ALT: 14 U/L (ref 0–44)
AST: 15 U/L (ref 15–41)
Albumin: 3.6 g/dL (ref 3.5–5.0)
Alkaline Phosphatase: 67 U/L (ref 38–126)
Anion gap: 8 (ref 5–15)
BUN: 7 mg/dL (ref 6–20)
CO2: 24 mmol/L (ref 22–32)
Calcium: 8.9 mg/dL (ref 8.9–10.3)
Chloride: 101 mmol/L (ref 98–111)
Creatinine, Ser: 0.8 mg/dL (ref 0.44–1.00)
GFR calc Af Amer: >60 mL/min (ref >60)
GFR calc non Af Amer: >60 mL/min (ref >60)
Glucose, Bld: 488 mg/dL — ABNORMAL HIGH (ref 70–99)
Potassium: 3.9 mmol/L (ref 3.5–5.1)
Sodium: 133 mmol/L — ABNORMAL LOW (ref 135–145)
Total Bilirubin: 0.8 mg/dL (ref 0.3–1.2)
Total Protein: 6.9 g/dL (ref 6.5–8.1)

## 2017-10-05 LAB — CBG MONITORING, ED
GLUCOSE-CAPILLARY: 325 mg/dL — AB (ref 70–99)
Glucose-Capillary: 459 mg/dL — ABNORMAL HIGH (ref 70–99)
Glucose-Capillary: 388 mg/dL — ABNORMAL HIGH (ref 70–99)

## 2017-10-05 LAB — PREGNANCY, URINE: Preg Test, Ur: NEGATIVE

## 2017-10-05 MED ORDER — INSULIN ASPART 100 UNIT/ML ~~LOC~~ SOLN
8.0000 [IU] | Freq: Once | SUBCUTANEOUS | Status: AC
  Administered 2017-10-05: 8 [IU] via SUBCUTANEOUS
  Filled 2017-10-05: qty 1

## 2017-10-05 MED ORDER — SODIUM CHLORIDE 0.9 % IV BOLUS
1000.0000 mL | Freq: Once | INTRAVENOUS | Status: AC
  Administered 2017-10-05: 1000 mL via INTRAVENOUS

## 2017-10-05 NOTE — ED Provider Notes (Signed)
Patient placed in Quick Look pathway, seen and evaluated   Chief Complaint: Hyperglycemia  HPI:   Patient with history of type 2 diabetes who presents with high readings on her glucometer.  She reports it is only been staying high.  She reports increased thirst and urinary frequency recently.  She is also had some intermittent lightheadedness and dull headache.  She denies any abdominal pain, nausea, vomiting.  ROS: + Lightheadedness, headache, urinary frequency, polydipsia  Physical Exam:   Gen: No distress  Neuro: Awake and Alert  Skin: Warm    Focused Exam: Heart normal rate and rhythm, lungs clear to auscultation, abdomen soft, nontender   Initiation of care has begun. The patient has been counseled on the process, plan, and necessity for staying for the completion/evaluation, and the remainder of the medical screening examination    Verdis PrimeLaw, Bentli Llorente M, PA-C 10/05/17 1622    Linwood DibblesKnapp, Jon, MD 10/06/17 0030

## 2017-10-05 NOTE — Discharge Instructions (Addendum)
Please read attached information. If you experience any new or worsening signs or symptoms please return to the emergency room for evaluation. Please follow-up with your primary care provider or specialist as discussed.  °

## 2017-10-05 NOTE — ED Provider Notes (Signed)
[facility] EMERGENCY DEPARTMENT Provider Note   CSN: [id number] Arrival date & time: 10/05/17  1542     History   Chief Complaint Chief Complaint  Patient presents with  . Hyperglycemia    HPI Mariah Miller is a 42 y.o. female.  HPI   42 year-old female with a past medical history of type 2 diabetes, hypertension presents today with complaints of hyperglycemia. Patient notes she is compliant with her medications which include NovoLog, Lantus, and metformin. She notes taking her NovoLog prior to arrival. She notes increased thirst and increased urination. She denies any fever, nausea, vomiting, or any infectious etiology. Patient denies abdominal pain. Patient reports she has been eating a low carb diet.  Past Medical History:  Diagnosis Date  . Diabetes mellitus without complication ([place])   . Hypertension   . Migraine   . Seizures ([place])    self reported    Patient Active Problem List   Diagnosis Date Noted  . Eczema 05/26/2017  . Primary insomnia 09/28/2016  . Anxiety and depression 05/26/2016  . OSA (obstructive sleep apnea) 04/30/2015  . Chest pain 04/28/2015  . DM2 (diabetes mellitus, type 2) ([place]) 04/28/2015  . ASCUS with positive high risk HPV cervical 06/06/2014  . Anemia, iron deficiency 03/17/2014  . Dyslipidemia 03/17/2014  . Essential hypertension 03/17/2014  . Uncontrolled diabetes mellitus ([place]) 06/17/2013  . Hypoglycemia 04/08/2013  . Diabetic neuropathy, painful ([place]) 03/21/2013  . Other and unspecified hyperlipidemia 01/22/2013  . A C DEGENERATION-CHRONIC 10/27/2008  . SHOULDER PAIN 05/01/2008  . IMPINGEMENT SYNDROME 05/01/2008    Past Surgical History:  Procedure Laterality Date  . CHOLECYSTECTOMY    . CYST EXCISION    . SHOULDER OPEN ROTATOR CUFF REPAIR Left   . TONSILLECTOMY    . TUBAL LIGATION       OB History    Gravida  6   Para  3   Term  3   Preterm      AB  3   Living  3     SAB  2   TAB        Ectopic      Multiple      Live Births  3            Home Medications    Prior to Admission medications   Medication Sig Start Date End Date Taking? Authorizing Provider  albuterol (PROVENTIL HFA;VENTOLIN HFA) 108 (90 Base) MCG/ACT inhaler Inhale 1-2 puffs into the lungs every 6 (six) hours as needed for wheezing or shortness of breath. 05/09/17   Volanda Napoleon, PA-C  aspirin EC 81 MG tablet Take 1 tablet (81 mg total) by mouth daily. 01/22/13   Dhungel, Flonnie Overman, MD  benzonatate (TESSALON) 100 MG capsule Take 1 capsule (100 mg total) by mouth every 8 (eight) hours. Patient not taking: Reported on 05/26/2017 05/09/17   [place] Lanius A, PA-C  Blood Glucose Monitoring Suppl (TRUE METRIX METER) w/Device KIT USE AS DIRECTED 05/01/15   Tresa Garter, MD  ferrous sulfate 325 (65 FE) MG tablet Take 1 tablet (325 mg total) by mouth 2 (two) times daily with a meal. 04/24/17   Charlott Rakes, MD  FLUoxetine (PROZAC) 40 MG capsule Take 1 capsule (40 mg total) by mouth daily. 04/24/17   Charlott Rakes, MD  gabapentin (NEURONTIN) 300 MG capsule Take 1 capsule (300 mg total) by mouth 3 (three) times daily. 04/24/17   Charlott Rakes, MD  glucose blood (TRUE METRIX  BLOOD GLUCOSE TEST) test strip Use as instructed 10/08/15   Angelica Chessman E, MD  insulin aspart (NOVOLOG) 100 UNIT/ML injection Inject 7-12 Units into the skin 3 (three) times daily as needed for high blood sugar. 301 - 350 = 12 units, > 350, call or come to clinic Patient not taking: Reported on 05/26/2017 09/28/16   Tresa Garter, MD  Insulin Glargine (LANTUS SOLOSTAR) 100 UNIT/ML Solostar Pen Inject 50 Units into the skin daily. 04/26/17   Charlott Rakes, MD  lisinopril-hydrochlorothiazide (PRINZIDE,ZESTORETIC) 20-12.5 MG tablet Take 2 tablets by mouth daily. 05/26/17   Charlott Rakes, MD  metFORMIN (GLUCOPHAGE) 1000 MG tablet Take 1 tablet (1,000 mg total) by mouth 2 (two) times daily with a meal. 04/24/17   Charlott Rakes, MD  methocarbamol (ROBAXIN) 500 MG tablet Take 1 tablet (500 mg total) by mouth 2 (two) times daily. 08/01/17   Leodis Alcocer, Dellis Filbert, PA-C  metoCLOPramide (REGLAN) 10 MG tablet Take 1 tablet (10 mg total) by mouth 3 (three) times daily before meals. 04/24/17   Charlott Rakes, MD  naproxen (NAPROSYN) 500 MG tablet Take 1 tablet (500 mg total) by mouth 2 (two) times daily. 03/12/17   Petrucelli, Samantha R, PA-C  simvastatin (ZOCOR) 10 MG tablet Take 1 tablet (10 mg total) by mouth at bedtime. 04/24/17   Charlott Rakes, MD  traZODone (DESYREL) 50 MG tablet Take 1 tablet (50 mg total) by mouth at bedtime as needed for sleep. 04/24/17   Charlott Rakes, MD  triamcinolone cream (KENALOG) 0.1 % Apply 1 application topically 2 (two) times daily. 05/26/17   Charlott Rakes, MD  TRUEPLUS LANCETS 28G MISC USE AS INSTRUCTED 05/01/15   Tresa Garter, MD    Family History Family History  Problem Relation Age of Onset  . Hypertension Mother   . Thyroid disease Mother   . Irregular heart beat Mother   . Bipolar disorder Sister     Social History Social History   Tobacco Use  . Smoking status: Former Research scientist (life sciences)  . Smokeless tobacco: Never Used  . Tobacco comment: Smoked for 4 years, quit age 79  Substance Use Topics  . Alcohol use: Yes    Alcohol/week: 0.0 oz    Comment: Holidays only  . Drug use: No     Allergies   Tomato   Review of Systems Review of Systems  All other systems reviewed and are negative.    Physical Exam Updated Vital Signs BP (!) 153/92 (BP Location: Right Arm)   Pulse 73   Temp 98.3 F (36.8 C) (Oral)   Resp 18   Ht '5\' 5"'$  (1.651 m)   Wt 95.3 kg (210 lb)   LMP 10/05/2017 (Exact Date)   SpO2 100%   BMI 34.95 kg/m   Physical Exam  Constitutional: She is oriented to person, place, and time. She appears well-developed and well-nourished.  HENT:  Head: Normocephalic and atraumatic.  Eyes: Pupils are equal, round, and reactive to light. Conjunctivae are  normal. Right eye exhibits no discharge. Left eye exhibits no discharge. No scleral icterus.  Neck: Normal range of motion. No JVD present. No tracheal deviation present.  Pulmonary/Chest: Effort normal. No stridor.  Abdominal: Soft. She exhibits no distension and no mass. There is no tenderness. There is no rebound and no guarding. No hernia.  Neurological: She is alert and oriented to person, place, and time. Coordination normal.  Psychiatric: She has a normal mood and affect. Her behavior is normal. Judgment and thought content normal.  Nursing  note and vitals reviewed.    ED Treatments / Results  Labs (all labs ordered are listed, but only abnormal results are displayed) Labs Reviewed  COMPREHENSIVE METABOLIC PANEL - Abnormal; Notable for the following components:      Result Value   Sodium 133 (*)    Glucose, Bld 488 (*)    All other components within normal limits  URINALYSIS, ROUTINE W REFLEX MICROSCOPIC - Abnormal; Notable for the following components:   Color, Urine STRAW (*)    Specific Gravity, Urine 1.035 (*)    Glucose, UA >=500 (*)    Hgb urine dipstick LARGE (*)    All other components within normal limits  CBG MONITORING, ED - Abnormal; Notable for the following components:   Glucose-Capillary 459 (*)    All other components within normal limits  CBG MONITORING, ED - Abnormal; Notable for the following components:   Glucose-Capillary 388 (*)    All other components within normal limits  CBG MONITORING, ED - Abnormal; Notable for the following components:   Glucose-Capillary 325 (*)    All other components within normal limits  CBC WITH DIFFERENTIAL/PLATELET  PREGNANCY, URINE    EKG None  Radiology No results found.  Procedures Procedures (including critical care time)  Medications Ordered in ED Medications  sodium chloride 0.9 % bolus 1,000 mL (0 mLs Intravenous Stopped 10/05/17 1912)  insulin aspart (novoLOG) injection 8 Units (8 Units Subcutaneous  Given 10/05/17 1912)     Initial Impression / Assessment and Plan / ED Course  I have reviewed the triage vital signs and the nursing notes.  Pertinent labs & imaging results that were available during my care of the patient were reviewed by me and considered in my medical decision making (see chart for details).     Labs: CBG, POC, urine preg  Imaging:  Consults:  Therapeutics:insulin, normal saline  Discharge Meds:   Assessment/Plan:42 year old female presents today with hyperglycemia. She is well-appearing no acute distress. She has no signs of DKA or hyperosmolar state. She was given a liter of fluid and insulin which did bring her blood sugar down. I discussed more fluids and more insulin, patient feels she is well enough for outpatient management, I agree. Patient will continue monitoring her blood sugar using sliding scale insulin and follow-up with her primary care provider for repeat evaluation. She is given strict return questions, she verbalized understanding and agreement to today's plan.     Final Clinical Impressions(s) / ED Diagnoses   Final diagnoses:  Hyperglycemia    ED Discharge Orders    None       Francee Gentile 10/05/17 2138    Francine Graven, DO 10/08/17 1523

## 2017-10-05 NOTE — ED Notes (Addendum)
Pt CBG 459.

## 2017-10-05 NOTE — ED Triage Notes (Signed)
Pt presents with a few day h/o fatigue, headaches; pt reports checking cbg today with reading "HIGH" on her monitor.  Pt took 5 units of humalog PTA.  Pt denies any recent medication change.

## 2017-10-05 NOTE — ED Notes (Signed)
Pt CBG 388.

## 2017-10-09 ENCOUNTER — Other Ambulatory Visit: Payer: Self-pay

## 2017-10-09 ENCOUNTER — Ambulatory Visit: Payer: No Typology Code available for payment source | Attending: Family Medicine | Admitting: Family Medicine

## 2017-10-09 ENCOUNTER — Encounter: Payer: Self-pay | Admitting: Family Medicine

## 2017-10-09 ENCOUNTER — Telehealth: Payer: Self-pay | Admitting: Family Medicine

## 2017-10-09 VITALS — BP 132/78 | HR 89 | Temp 98.4°F | Resp 12 | Wt 207.6 lb

## 2017-10-09 DIAGNOSIS — E1129 Type 2 diabetes mellitus with other diabetic kidney complication: Secondary | ICD-10-CM | POA: Diagnosis not present

## 2017-10-09 DIAGNOSIS — R319 Hematuria, unspecified: Secondary | ICD-10-CM | POA: Diagnosis not present

## 2017-10-09 DIAGNOSIS — E1142 Type 2 diabetes mellitus with diabetic polyneuropathy: Secondary | ICD-10-CM

## 2017-10-09 DIAGNOSIS — Z87891 Personal history of nicotine dependence: Secondary | ICD-10-CM | POA: Diagnosis not present

## 2017-10-09 DIAGNOSIS — E1165 Type 2 diabetes mellitus with hyperglycemia: Secondary | ICD-10-CM | POA: Diagnosis present

## 2017-10-09 DIAGNOSIS — R809 Proteinuria, unspecified: Secondary | ICD-10-CM | POA: Diagnosis not present

## 2017-10-09 DIAGNOSIS — Z794 Long term (current) use of insulin: Secondary | ICD-10-CM | POA: Insufficient documentation

## 2017-10-09 DIAGNOSIS — I10 Essential (primary) hypertension: Secondary | ICD-10-CM | POA: Insufficient documentation

## 2017-10-09 LAB — POCT URINALYSIS DIPSTICK
Appearance: NORMAL
Bilirubin, UA: NEGATIVE
Blood, UA: NEGATIVE
Glucose, UA: POSITIVE — AB
Ketones, UA: NEGATIVE
Leukocytes, UA: NEGATIVE
Nitrite, UA: NEGATIVE
Protein, UA: NEGATIVE
Spec Grav, UA: 1.02
Urobilinogen, UA: 0.2 U/dL
pH, UA: 5

## 2017-10-09 LAB — POCT GLYCOSYLATED HEMOGLOBIN (HGB A1C): Hemoglobin A1C: 11.7 % — AB (ref 4.0–5.6)

## 2017-10-09 LAB — GLUCOSE, POCT (MANUAL RESULT ENTRY): POC Glucose: 273 mg/dL — AB (ref 70–99)

## 2017-10-09 MED ORDER — INSULIN GLARGINE 100 UNIT/ML SOLOSTAR PEN: PEN_INJECTOR | SUBCUTANEOUS | 3 refills | Status: DC

## 2017-10-09 MED FILL — $LANTUS SOLOSTAR 100 UNITS/: 100 | 25 days supply | Qty: 15 | Fill #0

## 2017-10-09 NOTE — Progress Notes (Signed)
Subjective:    Patient ID: Mariah Miller, female    DOB: [birth year], 42 y.o.   MRN: [id number]  HPI  42 yo female seen for follow-up of recent ED visit due to hyperglycemia on 10/05/17. Patient states that her blood sugars have been in the 200's-300's for a while with lowest BS of 180. She has had increased thirst, blurred vision and urinary frequency. No fever or chills but she feels cold most of the time. On the day of her ED visit she was tired and her boyfriend told her that she fell asleep when she was not aware that this had occurred. She also was tired, had a headache as well as increased thirst, blurred vision and frequent urination. When she checked her BS the glucometer reading was "High" and she went to the ED for further evaluation and received fluids. Patient still feels fatigued and has not yet returned to work (reports that she was written out until Sat by the ED). Today she continues to have a headache- dull and generalized. Reports that she does not eat much other than salads so she does not understand why her BS remains high. 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 Reports compliance with pre-meal sliding scale but has to take between 8-12 units most of the time.   Past Medical History:  Diagnosis Date  . Diabetes mellitus without complication ([facility])   . Hypertension   . Migraine   . Seizures ([facility])    self reported   Past Surgical History:  Procedure Laterality Date  . CHOLECYSTECTOMY    . CYST EXCISION    . SHOULDER OPEN ROTATOR CUFF REPAIR Left   . TONSILLECTOMY    . TUBAL LIGATION     Allergies  Allergen Reactions  . Tomato Itching and Rash   Social History   Socioeconomic History  . Marital status: Divorced    Spouse name: Not on file  . Number of children: Not on file  . Years of education: Not on file  . Highest education level: Not on file  Occupational History   Comment: Assembler   Social Needs  . Financial resource strain: Not on file  . Food insecurity:    Worry: Not on file    Inability: Not on file  . Transportation needs:    Medical: Not on file    Non-medical: Not on file  Tobacco Use  . Smoking status: Former Games developer  . Smokeless tobacco: Never Used  . Tobacco comment: Smoked for 4 years, quit age 42  Substance and Sexual Activity  . Alcohol use: Yes    Alcohol/week: 0.0 oz    Comment: Holidays only  . Drug use: No  . Sexual activity: Yes    Birth control/protection: None  Lifestyle  . Physical activity:    Days per week: Not on file    Minutes per session: Not on file  . Stress: Not on file  Relationships  . Social connections:    Talks on phone: Not on file    Gets together: Not on file    Attends religious service: Not on file    Active member of club or organization: Not on file    Attends meetings of clubs or organizations: Not on file    Relationship status: Not on file  . Intimate partner violence:    Fear of current or ex partner: Not on file  Emotionally abused: Not on file    Physically abused: Not on file    Forced sexual activity: Not on file  Other Topics Concern  . Not on file  Social History Narrative  . Not on file    Review of Systems  Constitutional: Positive for fatigue.  HENT:       Dry mouth and increased thirst  Eyes: Positive for visual disturbance (blurred vision).  Cardiovascular: Negative for chest pain, palpitations and leg swelling.  Gastrointestinal: Negative for abdominal pain and nausea.  Endocrine: Positive for polydipsia and polyuria.  Genitourinary: Positive for frequency. Negative for dysuria.  Neurological: Positive for headaches.          Objective:   Physical Exam  Constitutional: She is oriented to person, place, and time. She appears well-developed and well-nourished. No distress.  HENT:  Mouth/Throat: Oropharynx is clear and moist.  TM's normal bilaterally;  normal intranasal exam  Eyes: Pupils are equal, round, and reactive to light. Conjunctivae and EOM are normal.  Neck: Normal range of motion. Neck supple.  Mild thyroid fullness on exam  Cardiovascular: Normal rate and regular rhythm.  Pulmonary/Chest: Effort normal and breath sounds normal.  Abdominal: Soft. There is no tenderness.  Genitourinary:  Genitourinary Comments: No CVA tenderness  Musculoskeletal: She exhibits no edema.  Neurological: She is alert and oriented to person, place, and time. No cranial nerve deficit.  Psychiatric: She has a normal mood and affect.  BP 132/78 (BP Location: Right Arm, Patient Position: Sitting, Cuff Size: Large)   Pulse 89   Temp 98.4 F (36.9 C) (Oral)   Resp 12   Wt 207 lb 9.6 oz (94.2 kg)   LMP 09/29/2017 (Exact Date)   SpO2 100%   BMI 34.55 kg/m   Vital signs and nurses notes reviewed       Assessment & Plan:  1. Uncontrolled type 2 diabetes mellitus with hyperglycemia, with long-term current use of insulin ([facility]) Recent note and labs from the ED reviewed and will add 10 units of Lantus in the evening in addition to current 50 units in the morning. Patient education on hyperglycemia. Continue pre-meal sliding scale. Wait for about 4 weeks before scheduling DM eye exam as vision is currently being affected by hyperglycemia. 1 week follow-up with clinic pharmacist for adjustment of insulin with eventual goal of fasting blood sugar between 90-130 and A1c of 7.5 or less within the next 12 months. Continue low carb diet, regular exercise and medications.  - Glucose (CBG) - HgB A1c - Insulin Glargine (LANTUS SOLOSTAR) 100 UNIT/ML Solostar Pen; 50 units in the morning and 10 units in the evening  Dispense: 5 pen; Refill: 3  2. Hematuria, unspecified UA was repeated due to hematuria on UA done in the ED. Per patient she had her menses at that time. UA today showed no hematuria.  - POCT urinalysis dipstick  3. Type 2 diabetes mellitus with  proteinuria ([facility]) On chart review, last microalbumin level was abnormal and UA done today was positive for protein. Patient education on diabetic nephropathy and discussed the need to improve control of diabetes, continued control of blood pressure, and avoid of nephrotoxins and may need adjustment of ace inhib therapy based on changes in proteinuria.  - POCT urinalysis dipstick  An After Visit Summary was printed and given to the patient.  Return in about 1 week (around 10/16/2017) for 1 week follow-up with CPP and 4-6 weeks with PCP.

## 2017-10-09 NOTE — Progress Notes (Signed)
Pt here for hyperglycemia.  Blood sugars have been 399 and 368 today.  Novolog 12 units  0830.  Feels real tired and has headache Denies n/v.

## 2017-10-09 NOTE — Patient Instructions (Signed)
Diabetic Nephropathy Diabetic nephropathy is kidney disease that is caused by diabetes (diabetes mellitus). Kidneys are organs that filter and clean blood and get rid of body waste products and extra fluid. Diabetes can cause gradual kidney damage over many years. Diabetic nephropathy that continues to get worse (progress) can lead to kidney failure. What are the causes? This condition is caused by kidney damage from diabetes that is not well controlled with treatment. Having high blood sugar (glucose) for a long time can damage blood vessels in the kidneys and cause them to thicken and scar. This prevents the kidneys from functioning normally. What increases the risk? This condition is more likely to develop in people with diabetes who:  Have had diabetes for many years.  Have high blood pressure.  Have high blood glucose levels over a long period of time.  Have a family history of kidney disease.  Have a history of tobacco use.  Have certain genes that are passed from parent to child (inherited).  Are of African-American, Hispanic, Native American, Asian, or Pacific Islander descent.  What are the signs or symptoms? This condition may not cause symptoms at first. If you do have symptoms, they may include:  Swelling of your hands, feet, or ankles.  Weakness.  Poor appetite.  Nausea.  Confusion.  Fatigue.  Trouble sleeping.  Dry, itchy skin.  If nephropathy leads to kidney failure, symptoms may include:  Vomiting.  Shortness of breath.  Jerky movements you cannot control (seizure).  Coma.  How is this diagnosed? It is important to diagnose this condition before symptoms develop. You may be screened for diabetic nephropathy at a routine health care visit. Screening tests may include:  Yearly (annual) urine tests.  Urine collection over a 24-hour period to measure kidney function.  Blood tests that measure blood glucose levels and kidney function.  If your  health care provider suspects diabetic nephropathy, he or she may:  Review your medical history and symptoms.  Do a physical exam.  Do an ultrasound of your kidneys.  Perform a procedure to take a sample of kidney tissue for testing (biopsy).  How is this treated? The goal of treatment is to prevent or slow down damage to your kidneys by managing your diabetes. To do this, it is important to control:  Your blood pressure. ? Generally, the goal is to keep your blood pressure below 130/80. Your target blood pressure depends on many factors. ? To help control blood pressure, you may be prescribed medicines to lower blood pressure (ACE inhibitors) or to help your body get rid of excess fluid (diuretics).  Your A1c (hemoglobin A1c) level. Generally, the goal of treatment is to maintain an A1c level of less than 7%.  Your blood glucose level.  Your blood lipids. If you have high cholesterol, you may need to take lipid-lowering drugs, such as statins.  Other treatments may include:  Medicines, including insulin injections.  Lifestyle changes, such as: ? Losing weight. ? Quitting smoking (smoking cessation).  Changes to your diet, which may include: ? Limiting your salt (sodium), protein, and fluid intake. ? Taking vitamin D supplements.  If your disease progresses to end-stage kidney failure, treatment may include:  Dialysis. This is a procedure to filter your blood with a machine.  Kidney transplant.  Follow these instructions at home: Lifestyle  Maintain a healthy weight. Work with your health care provider to lose weight, if needed.  Do not use any products that contain nicotine or tobacco, such as   cigarettes and e-cigarettes. If you need help quitting, ask your health care provider.  Be physically active every day. Ask your health care provider what type of exercise is best for you.  Eat healthy foods, and eat healthy snacks between meals. Follow instructions from your  health care provider about eating and drinking restrictions. ? Limit your sodium, protein, or fluid intake as directed.  Work with your health care provider to manage your blood pressure. General instructions  Follow your diabetes management plan as directed. ? Check your blood glucose levels as directed by your health care provider. ? Keep your blood glucose in your target range as directed by your health care provider. ? Have your A1c level checked at least two times a year, or as often as told by your health care provider.  Take over-the-counter and prescription medicines only as told by your health care provider. This includes insulin and supplements.  Keep all follow-up visits and routine visits as told by your health care provider. This is important. Make sure to get screening tests as directed. Contact a health care provider if:  You have trouble keeping your blood glucose in your goal range.  Your blood glucose level is higher than 240 mg/dL (78.213.3 mmol/L) for 2 days in a row.  You have swelling in your hands, ankles, or feet.  You feel weak, tired, or dizzy.  You have involuntary muscle tightening (spasms).  You have nausea or vomiting.  You feel tired all the time. Get help right away if:  You are very sleepy.  You have: ? A seizure. ? Severe, painful muscle spasms. ? Shortness of breath. ? Chest pain.  You faint. Summary  Keep your blood glucose in your target range as directed by your health care provider.  Work with your health care provider to manage your blood pressure.  Keep all follow-up visits and routine visits as told by your health care provider. This is important. Make sure to get screening tests as directed. This information is not intended to replace advice given to you by your health care provider. Make sure you discuss any questions you have with your health care provider. Document Released: 03/13/2007 Document Revised: 01/20/2016 Document  Reviewed: 01/20/2016 Elsevier Interactive Patient Education  2018 [employer]  Hyperglycemia Hyperglycemia occurs when the level of sugar (glucose) in the blood is too high. Glucose is a type of sugar that provides the body's main source of energy. Certain hormones (insulin and glucagon) control the level of glucose in the blood. Insulin lowers blood glucose, and glucagon increases blood glucose. Hyperglycemia can result from having too little insulin in the bloodstream, or from the body not responding normally to insulin. Hyperglycemia occurs most often in people who have diabetes (diabetes mellitus), but it can happen in people who do not have diabetes. It can develop quickly, and it can be life-threatening if it causes you to become severely dehydrated (diabetic ketoacidosis or hyperglycemic hyperosmolar state). Severe hyperglycemia is a medical emergency. What are the causes? If you have diabetes, hyperglycemia may be caused by:  Diabetes medicine.  Medicines that increase blood glucose or affect your diabetes control.  Not eating enough, or not eating often enough.  Changes in physical activity level.  Being sick or having an infection.  If you have prediabetes or undiagnosed diabetes:  Hyperglycemia may be caused by those conditions.  If you do not have diabetes, hyperglycemia may be caused by:  Certain medicines, including steroid medicines, beta-blockers, epinephrine, and thiazide diuretics.  Stress.  Serious illness.  Surgery.  Diseases of the pancreas.  Infection.  What increases the risk? Hyperglycemia is more likely to develop in people who have risk factors for diabetes, such as:  Having a family member with diabetes.  Having a gene for type 1 diabetes that is passed from parent to child (inherited).  Living in an area with cold weather conditions.  Exposure to certain viruses.  Certain conditions in which the body's disease-fighting (immune) system  attacks itself (autoimmune disorders).  Being overweight or obese.  Having an inactive (sedentary) lifestyle.  Having been diagnosed with insulin resistance.  Having a history of prediabetes, gestational diabetes, or polycystic ovarian syndrome (PCOS).  Being of American-Indian, African-American, Hispanic/Latino, or Asian/Pacific Islander descent.  What are the signs or symptoms? Hyperglycemia may not cause any symptoms. If you do have symptoms, they may include early warning signs, such as:  Increased thirst.  Hunger.  Feeling very tired.  Needing to urinate more often than usual.  Blurry vision.  Other symptoms may develop if hyperglycemia gets worse, such as:  Dry mouth.  Loss of appetite.  Fruity-smelling breath.  Weakness.  Unexpected or rapid weight gain or weight loss.  Tingling or numbness in the hands or feet.  Headache.  Skin that does not quickly return to normal after being lightly pinched and released (poor skin turgor).  Abdominal pain.  Cuts or bruises that are slow to heal.  How is this diagnosed? Hyperglycemia is diagnosed with a blood test to measure your blood glucose level. This blood test is usually done while you are having symptoms. Your health care provider may also do a physical exam and review your medical history. You may have more tests to determine the cause of your hyperglycemia, such as:  A fasting blood glucose (FBG) test. You will not be allowed to eat (you will fast) for at least 8 hours before a blood sample is taken.  An A1c (hemoglobin A1c) blood test. This provides information about blood glucose control over the previous 2-3 months.  An oral glucose tolerance test (OGTT). This measures your blood glucose at two times: ? After fasting. This is your baseline blood glucose level. ? Two hours after drinking a beverage that contains glucose.  How is this treated? Treatment depends on the cause of your hyperglycemia.  Treatment may include:  Taking medicine to regulate your blood glucose levels. If you take insulin or other diabetes medicines, your medicine or dosage may be adjusted.  Lifestyle changes, such as exercising more, eating healthier foods, or losing weight.  Treating an illness or infection, if this caused your hyperglycemia.  Checking your blood glucose more often.  Stopping or reducing steroid medicines, if these caused your hyperglycemia.  If your hyperglycemia becomes severe and it results in hyperglycemic hyperosmolar state, you must be hospitalized and given IV fluids. Follow these instructions at home: General instructions  Take over-the-counter and prescription medicines only as told by your health care provider.  Do not use any products that contain nicotine or tobacco, such as cigarettes and e-cigarettes. If you need help quitting, ask your health care provider.  Limit alcohol intake to no more than 1 drink per day for nonpregnant women and 2 drinks per day for men. One drink equals 12 oz of beer, 5 oz of wine, or 1 oz of hard liquor.  Learn to manage stress. If you need help with this, ask your health care provider.  Keep all follow-up visits as told by  your health care provider. This is important. Eating and drinking  Maintain a healthy weight.  Exercise regularly, as directed by your health care provider.  Stay hydrated, especially when you exercise, get sick, or spend time in hot temperatures.  Eat healthy foods, such as: ? Lean proteins. ? Complex carbohydrates. ? Fresh fruits and vegetables. ? Low-fat dairy products. ? Healthy fats.  Drink enough fluid to keep your urine clear or pale yellow. If you have diabetes:   Make sure you know the symptoms of hyperglycemia.  Follow your diabetes management plan, as told by your health care provider. Make sure you: ? Take your insulin and medicines as directed. ? Follow your exercise plan. ? Follow your meal  plan. Eat on time, and do not skip meals. ? Check your blood glucose as often as directed. Make sure to check your blood glucose before and after exercise. If you exercise longer or in a different way than usual, check your blood glucose more often. ? Follow your sick day plan whenever you cannot eat or drink normally. Make this plan in advance with your health care provider.  Share your diabetes management plan with people in your workplace, school, and household.  Check your urine for ketones when you are ill and as told by your health care provider.  Carry a medical alert card or wear medical alert jewelry. Contact a health care provider if:  Your blood glucose is at or above 240 mg/dL (16.1 mmol/L) for 2 days in a row.  You have problems keeping your blood glucose in your target range.  You have frequent episodes of hyperglycemia. Get help right away if:  You have difficulty breathing.  You have a change in how you think, feel, or act (mental status).  You have nausea or vomiting that does not go away. These symptoms may represent a serious problem that is an emergency. Do not wait to see if the symptoms will go away. Get medical help right away. Call your local emergency services (911 in the [place] Do not drive yourself to the hospital. Summary  Hyperglycemia occurs when the level of sugar (glucose) in the blood is too high.  Hyperglycemia is diagnosed with a blood test to measure your blood glucose level. This blood test is usually done while you are having symptoms. Your health care provider may also do a physical exam and review your medical history.  If you have diabetes, follow your diabetes management plan as told by your health care provider.  Contact your health care provider if you have problems keeping your blood glucose in your target range. This information is not intended to replace advice given to you by your health care provider. Make sure you discuss any  questions you have with your health care provider. Document Released: 08/17/2000 Document Revised: 11/09/2015 Document Reviewed: 11/09/2015 Elsevier Interactive Patient Education  [employer].

## 2017-10-09 NOTE — Telephone Encounter (Signed)
Patient called stating that her sugar is running over 400 and she wants to know how much novolog  to take. please call patient at  [phone]

## 2017-10-09 NOTE — Telephone Encounter (Signed)
Pt states she was seen on 10/05/2017 in the ED for hyperglycemia. She still has high blood sugar readings. This a.m  At 0830, her blood sugar was 399. She took 12 units of insulin.  I asked patient her to take her blood sugar while on the phone and she reports her reading of 368. Informed patient she would be a work in appointment today to address hyperglycemia. Pt states she will be in office today.

## 2017-10-12 ENCOUNTER — Ambulatory Visit: Payer: No Typology Code available for payment source | Admitting: Family Medicine

## 2017-10-16 ENCOUNTER — Ambulatory Visit: Payer: No Typology Code available for payment source | Attending: Family Medicine | Admitting: Pharmacist

## 2017-10-16 ENCOUNTER — Encounter: Payer: Self-pay | Admitting: Pharmacist

## 2017-10-16 DIAGNOSIS — Z794 Long term (current) use of insulin: Secondary | ICD-10-CM

## 2017-10-16 DIAGNOSIS — E1140 Type 2 diabetes mellitus with diabetic neuropathy, unspecified: Secondary | ICD-10-CM | POA: Insufficient documentation

## 2017-10-16 DIAGNOSIS — E119 Type 2 diabetes mellitus without complications: Secondary | ICD-10-CM | POA: Diagnosis present

## 2017-10-16 DIAGNOSIS — E1165 Type 2 diabetes mellitus with hyperglycemia: Secondary | ICD-10-CM | POA: Insufficient documentation

## 2017-10-16 DIAGNOSIS — Z87891 Personal history of nicotine dependence: Secondary | ICD-10-CM | POA: Insufficient documentation

## 2017-10-16 DIAGNOSIS — R631 Polydipsia: Secondary | ICD-10-CM | POA: Insufficient documentation

## 2017-10-16 DIAGNOSIS — R358 Other polyuria: Secondary | ICD-10-CM | POA: Insufficient documentation

## 2017-10-16 LAB — GLUCOSE, POCT (MANUAL RESULT ENTRY): POC GLUCOSE: 87 mg/dL (ref 70–99)

## 2017-10-16 NOTE — Progress Notes (Signed)
S:    PCP: Dr. Alvis Miller  No chief complaint on file.  Patient arrives in good spirits.  Presents for diabetes management at the request of Dr. Jillyn Miller, who referred the patient to my on 10/09/17. Patient was last seen by her PCP on 05/26/17.   HPI from 10/09/17 visit with Dr. Jillyn Miller: - Pt was seen post ED presentation (10/05/17).  - Pt reported home glucose levels in 200-300s. - She reported polydipsia, polyuria, blurred vision, and headaches.  - POCT glucose 273. A1c resulted at 11.7.  - Lantus was increased to 50 units in the morning and 10 units in the evening. Sliding scale Novolog continued.   Today, patient reports no problems with medications. Does endorse nocturia and baseline neuropathy. Polydipsia is improved but still occurring at times. Patient denies hypoglycemic events. Patient reports diabetes was diagnosed over 10 years ago.   Family/Social History:  - Family history: HTN and thyroid disease in mother - Tobacco: formed smoker- quit at age 42 after smoking 4 years.  - Alcohol: reports occasional use on holidays   Insurance coverage/medication affordability:  [facility]  Patient reports adherence with medications. However, she's taking differently than prescribed.  Current diabetes medications include:  - Metformin 1,000 mg BID - Lantus 50 units in the morning and 10 units in the evening. Reports 55 morning, 10 units at night.  - Humalog 7-12 units per sliding scale   Patient reported dietary habits:  - Eats 3 meals/day when not working. 1 or 2 meals when working.  - Breakfast: boiled eggs, [place] bacon, and 1/2 bagel - Lunch: salad with no dressing  - Dinner:grilled chicken and salad with no dressing - Drinks: water, denies drinking sweetened beverages, juice, or soda  Patient-reported exercise habits:  - 8 hours on feet at work - nothing additional   Patient reports nocturia.  Patient denies any changes in neuropathy from baseline. Patient  denies visual changes. Patient denies self foot exams.   O:  POCT glucose: 87. Patient reports eating this morning ~6 AM.   Home glucose levels:  - Gives range 100s-200s.  - Of note, most readings are in mid-high 100s. One outlier of 384.  Lab Results  Component Value Date   HGBA1C 11.7 (A) 10/09/2017   Lipid Panel     Component Value Date/Time   CHOL 171 04/24/2017 0918   TRIG 104 04/24/2017 0918   HDL 41 04/24/2017 0918   CHOLHDL 4.2 04/24/2017 0918   CHOLHDL 4.7 05/25/2016 1543   VLDL 37 (H) 05/25/2016 1543   LDLCALC 109 (H) 04/24/2017 [id number]   Clinical ASCVD: No  10 year ASCVD risk: 7.5%   A/P: Diabetes longstanding currently uncontrolled with latest A1c of 11.7. Patient is able to verbalize appropriate hypoglycemia management plan. Patient is adherent with medication but does report taking differently as noted above. She is still experiencing polyuria and polydipsia but reports this as improving. POCT glucose at goal today but most home levels are above goal.   [name] works 3rd shift and sometimes only eats 1 or 2 meals/day when working. I suspect that her irregular meal schedule is contributing to her glucose dysregulation. Will titrate basal insulin to achieve fasting glucose range of 90-130. For now, will continue sliding scale.   -Increased dose of Lantus (insulin glargine) to 35 units BID -Continued Humalog (insulin lispro) sliding scale with meals.  -Extensively discussed pathophysiology of DM, recommended lifestyle interventions, dietary effects on glycemic control -Counseled on s/sx  of and management of hypoglycemia -Next A1C anticipated 01/2018.   ASCVD risk - primary prevention in patient with DM. Last LDL slightly above 100. ASCVD risk score is not >20%. Moderate intensity statin indicated. She is currently taking low-intensity with simvastatin 10 mg daily. Aspirin is indicated.  -Continued aspirin 81 mg  -Continued simvastatin 10 mg. Of note, patient with  DM and LDL > 100. She may benefit from intensification of statin therapy. We did not cover this in today's encounter. Will forward information to patient's PCP.    Written patient instructions provided.  Total time in face to face counseling 15 minutes.   Follow up Pharmacist 10/31/17.   Patient seen with: Mosie LukesMichele Miller, PharmD Candidate [facility] [employer]  Butch PennyLuke Van Miller, PharmD, CPP Clinical Pharmacist [facility] [phone]

## 2017-10-16 NOTE — Patient Instructions (Signed)
Thank you for coming to see me today. Please do the following:  1. Change Lantus to 35 units 2 times a day. 2. Continue to take sliding scale Humalog. 3. Continue checking blood sugars at home. It's really important that you record these and bring these in to your next doctor's appointment. If you get in readings above 500 or lower than 70, call me or the clinic to let your doctor know. See below on how to treat low blood sugar.  4. Continue making the lifestyle changes we've discussed together during our visit. Diet and exercise play a significant role in improving your blood sugars.  5. Follow-up as soon as possible with a doctor due to your urinary symptoms. You may have to schedule a walk-in appointment for this week or next.  6. I would like to see you in 2 weeks to follow-up before you see Dr. Alvis LemmingsNewlin.   Hypoglycemia or low blood sugar:   Low blood sugar can happen quickly and may become an emergency if not treated right away.   While this shouldn't happen often, it can be brought upon if you skip a meal or do not eat enough. Also, if your insulin or other diabetes medications are dosed too high, this can cause your blood sugar to go to low.   Warning signs of low blood sugar include: 1. Feeling shaky or dizzy 2. Feeling weak or tired  3. Excessive hunger 4. Feeling anxious or upset  5. Sweating even when you aren't exercising  What to do if I experience low blood sugar? 1. Check your blood sugar with your meter. If lower than 70, proceed to step 2.  2. Treat with 3-4 glucose tablets or 3 packets of regular sugar. If these aren't around, you can try hard candy. Yet another option would be to drink 4 ounces of fruit juice or 6 ounces of REGULAR soda.  3. Re-check your sugar in 15 minutes. If it is still below 70, do what you did in step 2 again. If has come back up, go ahead and eat a snack or small meal at this time.

## 2017-10-19 ENCOUNTER — Other Ambulatory Visit: Payer: Self-pay | Admitting: Pharmacist

## 2017-10-19 MED ORDER — SIMVASTATIN 20 MG PO TABS: 20.0000 mg | ORAL_TABLET | Freq: Every day | ORAL | 3 refills | Status: DC

## 2017-10-19 NOTE — Progress Notes (Signed)
Discussed intensification of statin therapy with Dr. Alvis LemmingsNewlin. She is agreeable to moderate intensity statin therapy. Script sent in.

## 2017-10-31 ENCOUNTER — Ambulatory Visit: Payer: No Typology Code available for payment source | Attending: Family Medicine | Admitting: Pharmacist

## 2017-10-31 ENCOUNTER — Encounter: Payer: Self-pay | Admitting: Pharmacist

## 2017-10-31 DIAGNOSIS — Z8249 Family history of ischemic heart disease and other diseases of the circulatory system: Secondary | ICD-10-CM | POA: Diagnosis not present

## 2017-10-31 DIAGNOSIS — Z794 Long term (current) use of insulin: Secondary | ICD-10-CM | POA: Diagnosis not present

## 2017-10-31 DIAGNOSIS — E1142 Type 2 diabetes mellitus with diabetic polyneuropathy: Secondary | ICD-10-CM

## 2017-10-31 DIAGNOSIS — Z79899 Other long term (current) drug therapy: Secondary | ICD-10-CM | POA: Insufficient documentation

## 2017-10-31 DIAGNOSIS — E1165 Type 2 diabetes mellitus with hyperglycemia: Secondary | ICD-10-CM | POA: Diagnosis not present

## 2017-10-31 DIAGNOSIS — Z8349 Family history of other endocrine, nutritional and metabolic diseases: Secondary | ICD-10-CM | POA: Insufficient documentation

## 2017-10-31 DIAGNOSIS — Z87891 Personal history of nicotine dependence: Secondary | ICD-10-CM | POA: Insufficient documentation

## 2017-10-31 DIAGNOSIS — E119 Type 2 diabetes mellitus without complications: Secondary | ICD-10-CM | POA: Insufficient documentation

## 2017-10-31 LAB — GLUCOSE, POCT (MANUAL RESULT ENTRY): POC Glucose: 255 mg/dl — AB (ref 70–99)

## 2017-10-31 MED ORDER — INSULIN ASPART 100 UNIT/ML ~~LOC~~ SOLN: 7.0000 [IU] | Freq: Three times a day (TID) | SUBCUTANEOUS | 3 refills | Status: DC | PRN

## 2017-10-31 MED FILL — metFORMIN HCL 1000 MG TABS: 1000 | 30 days supply | Qty: 60 | Fill #1

## 2017-10-31 MED FILL — SIMVASTATIN 20 MG TABLET: 20 | 30 days supply | Qty: 30 | Fill #0

## 2017-10-31 NOTE — Patient Instructions (Addendum)
Thank you for coming to see me today. Please do the following:  1. Continue Lantus 35 units BID 2. Continue metformin 1000 mg BID 3. Continue sliding scale Humalog 4. Continue checking blood sugars at home. It's really important that you record these and bring these in to your next doctor's appointment. If you get in readings above 500 or lower than 70, call me or the clinic to let your doctor know. See below on how to treat low blood sugar.  5. Continue making the lifestyle changes we've discussed together during our visit. Diet and exercise play a significant role in improving your blood sugars.  6. Follow-up with PCP 11/16/17.  Hypoglycemia or low blood sugar:   Low blood sugar can happen quickly and may become an emergency if not treated right away.   While this shouldn't happen often, it can be brought upon if you skip a meal or do not eat enough. Also, if your insulin or other diabetes medications are dosed too high, this can cause your blood sugar to go to low.   Warning signs of low blood sugar include: 1. Feeling shaky or dizzy 2. Feeling weak or tired  3. Excessive hunger 4. Feeling anxious or upset  5. Sweating even when you aren't exercising  What to do if I experience low blood sugar? 1. Check your blood sugar with your meter. If lower than 70, proceed to step 2.  2. Treat with 3-4 glucose tablets or 3 packets of regular sugar. If these aren't around, you can try hard candy. Yet another option would be to drink 4 ounces of fruit juice or 6 ounces of REGULAR soda.  3. Re-check your sugar in 15 minutes. If it is still below 70, do what you did in step 2 again. If has come back up, go ahead and eat a snack or small meal at this time.

## 2017-10-31 NOTE — Progress Notes (Signed)
S:    PCP: Dr. Alvis Lemmings  No chief complaint on file.  Patient arrives in good spirits.  Presents for diabetes management at the request of Dr. Jillyn Hidden, who referred the patient to me on 10/09/17. Patient was last seen by her PCP on 05/26/17.   Last visit with me (10/16/17):  - insulin regimen adjusted.   Today, patient reports no problems with medications. Does endorse continued nocturia and baseline neuropathy. Patient denies hypoglycemic events.   Patient reports adherence with insulin but not with metformin.  Current diabetes medications include:  - Metformin 1,000 mg BID - Lantus 35 units BID - Humalog 7-12 units per sliding scale   Family/Social History:  - Family history: HTN and thyroid disease in mother - Tobacco: formed smoker- quit at age 37 after smoking 4 years.  - Alcohol: reports occasional use on holidays   Insurance coverage/medication affordability:  - Patient assistance at [facility] pharmacy  Patient reported dietary habits:  - Eats 3 meals/day when not working. 1 or 2 meals when working.  - No changes since last encounter - Of note, she does report high intake of carbs after work - "I just want to have something quick and lay down"  Patient-reported exercise habits:  - 8 hours on feet at work - nothing additional   O:  POCT glucose 255. Patient reports eating this morning ~6 AM.   Home glucose levels: - Fasting vs prandial difficult to differentiate due to patient's work schedule.  - Patient reports sleeping from 10 AM - 9 PM.  - Gives home sugar range of 92 - 357. Of note, most levels during/after work are at goal. Glucose levels before work when patient wake up (~9 PM) are consistently above goal.    Lab Results  Component Value Date   HGBA1C 11.7 (A) 10/09/2017   Lipid Panel     Component Value Date/Time   CHOL 171 04/24/2017 0918   TRIG 104 04/24/2017 0918   HDL 41 04/24/2017 0918   CHOLHDL 4.2 04/24/2017 0918   CHOLHDL 4.7 05/25/2016 1543   VLDL  37 (H) 05/25/2016 1543   LDLCALC 109 (H) 04/24/2017 1610   Clinical ASCVD: No  10 year ASCVD risk: 7.5%   A/P: Diabetes longstanding currently uncontrolled with latest A1c of 11.7. Patient is able to verbalize appropriate hypoglycemia management plan. Patient reports adherence with insulin but not metformin. I was able to verify that she is up-to-date with refills in our pharmacy for the insulin but she hasn't picked up metformin since 05/2017. She has a history of non-adherence with our pharmacy here at [facility].   For now, will have patient maintain current insulin regimen. I have reinforced the importance of taking her insulin and all medications, including oral metformin. Patient is also diet non-compliant. Reinforced importance of dietary discretion. Discussed findings with patient's PCP. She may benefit from Victoza. Dr. Alvis Lemmings wishes to initiate this at a future encounter (11/20/17).   -Continued Lantus (insulin glargine) to 35 units BID -Continued Humalog (insulin lispro) sliding scale with meals.  -Extensively discussed pathophysiology of DM, recommended lifestyle interventions, dietary effects on glycemic control -Counseled on s/sx of and management of hypoglycemia -Next A1C anticipated 01/2018.   ASCVD risk - primary prevention in patient with DM. Last LDL slightly above 100. ASCVD risk score is not >20%. Moderate intensity statin indicated. She is currently taking low-intensity with simvastatin 10 mg daily. Aspirin is indicated.  -Continued aspirin 81 mg  -Continued simvastatin 20 mg. Advised her  to pick this up from pharmacy.   Written patient instructions provided.  Total time in face to face counseling 15 minutes.   Follow up PCP 11/20/17.   Patient seen with: Mosie LukesMichele Muir, PharmD Candidate [employer] [employer]  Butch PennyLuke Van Ausdall, PharmD, CPP Clinical Pharmacist [facility] [phone]

## 2017-11-02 MED FILL — LANTUS SOLOSTAR 100 UNITS/M: 100 | 25 days supply | Qty: 15 | Fill #1

## 2017-11-09 ENCOUNTER — Ambulatory Visit: Payer: No Typology Code available for payment source | Admitting: Family Medicine

## 2017-11-20 ENCOUNTER — Ambulatory Visit: Payer: No Typology Code available for payment source | Admitting: Family Medicine

## 2017-11-26 ENCOUNTER — Other Ambulatory Visit: Payer: Self-pay

## 2017-11-26 ENCOUNTER — Encounter (HOSPITAL_COMMUNITY): Payer: Self-pay | Admitting: Emergency Medicine

## 2017-11-26 ENCOUNTER — Emergency Department (HOSPITAL_COMMUNITY)
Admission: EM | Admit: 2017-11-26 | Discharge: 2017-11-27 | Disposition: A | Discharge status: Home / Self Care | Payer: No Typology Code available for payment source | Attending: Emergency Medicine | Admitting: Emergency Medicine

## 2017-11-26 DIAGNOSIS — Z79899 Other long term (current) drug therapy: Secondary | ICD-10-CM | POA: Diagnosis not present

## 2017-11-26 DIAGNOSIS — Z794 Long term (current) use of insulin: Secondary | ICD-10-CM | POA: Diagnosis not present

## 2017-11-26 DIAGNOSIS — G43809 Other migraine, not intractable, without status migrainosus: Secondary | ICD-10-CM

## 2017-11-26 DIAGNOSIS — R51 Headache: Secondary | ICD-10-CM | POA: Diagnosis present

## 2017-11-26 DIAGNOSIS — Z87891 Personal history of nicotine dependence: Secondary | ICD-10-CM | POA: Insufficient documentation

## 2017-11-26 DIAGNOSIS — Z7982 Long term (current) use of aspirin: Secondary | ICD-10-CM | POA: Insufficient documentation

## 2017-11-26 DIAGNOSIS — E1140 Type 2 diabetes mellitus with diabetic neuropathy, unspecified: Secondary | ICD-10-CM | POA: Diagnosis not present

## 2017-11-26 DIAGNOSIS — I10 Essential (primary) hypertension: Secondary | ICD-10-CM | POA: Diagnosis not present

## 2017-11-26 NOTE — ED Triage Notes (Addendum)
Pt c/o "migraine" since yesterday with light sensitivity, nausea, and blurred vision to bilateral eyes.  Pt denies history of headaches but migraines was listed in previous medical history.  No arm drift.  No neuro deficits noted on triage exam.

## 2017-11-27 ENCOUNTER — Emergency Department (HOSPITAL_COMMUNITY): Payer: No Typology Code available for payment source

## 2017-11-27 ENCOUNTER — Encounter (HOSPITAL_COMMUNITY): Payer: Self-pay | Admitting: Emergency Medicine

## 2017-11-27 LAB — I-STAT BETA HCG BLOOD, ED (MC, WL, AP ONLY): I-stat hCG, quantitative: <5 m[IU]/mL (ref <5)

## 2017-11-27 MED ORDER — SODIUM CHLORIDE 0.9 % IV BOLUS
1000.0000 mL | Freq: Once | INTRAVENOUS | Status: AC
  Administered 2017-11-27: 1000 mL via INTRAVENOUS

## 2017-11-27 MED ORDER — DIVALPROEX SODIUM 250 MG PO DR TAB
500.0000 mg | DELAYED_RELEASE_TABLET | Freq: Two times a day (BID) | ORAL | Status: DC
  Administered 2017-11-27: 500 mg via ORAL
  Filled 2017-11-27: qty 2

## 2017-11-27 MED ORDER — KETOROLAC TROMETHAMINE 30 MG/ML IJ SOLN
15.0000 mg | Freq: Once | INTRAMUSCULAR | Status: AC
  Administered 2017-11-27: 15 mg via INTRAVENOUS
  Filled 2017-11-27: qty 1

## 2017-11-27 MED ORDER — DIPHENHYDRAMINE HCL 50 MG/ML IJ SOLN
12.5000 mg | Freq: Once | INTRAMUSCULAR | Status: AC
  Administered 2017-11-27: 12.5 mg via INTRAVENOUS
  Filled 2017-11-27: qty 1

## 2017-11-27 MED ORDER — METOCLOPRAMIDE HCL 5 MG/ML IJ SOLN
10.0000 mg | Freq: Once | INTRAMUSCULAR | Status: AC
  Administered 2017-11-27: 10 mg via INTRAVENOUS
  Filled 2017-11-27: qty 2

## 2017-11-27 NOTE — ED Provider Notes (Signed)
[place] EMERGENCY DEPARTMENT Provider Note   CSN: [id number] Arrival date & time: 11/26/17  1955     History   Chief Complaint Chief Complaint  Patient presents with  . Headache  . Nausea  . Blurred Vision    HPI Mariah Miller is a 42 y.o. female.  The history is provided by the patient.  Headache   This is a recurrent problem. The current episode started 2 days ago. The problem occurs constantly. The problem has not changed since onset.The headache is associated with bright light. The pain is located in the frontal region. The pain does not radiate. Associated symptoms include nausea. Pertinent negatives include no anorexia, no fever, no malaise/fatigue, no chest pressure, no near-syncope, no orthopnea, no palpitations, no syncope, no shortness of breath and no vomiting. She has tried NSAIDs for the symptoms. The treatment provided no relief.    Past Medical History:  Diagnosis Date  . Diabetes mellitus without complication ([place])   . Hypertension   . Migraine   . Seizures ([place])    self reported    Patient Active Problem List   Diagnosis Date Noted  . Eczema 05/26/2017  . Primary insomnia 09/28/2016  . Anxiety and depression 05/26/2016  . OSA (obstructive sleep apnea) 04/30/2015  . Chest pain 04/28/2015  . DM2 (diabetes mellitus, type 2) ([place]) 04/28/2015  . ASCUS with positive high risk HPV cervical 06/06/2014  . Anemia, iron deficiency 03/17/2014  . Dyslipidemia 03/17/2014  . Essential hypertension 03/17/2014  . Uncontrolled diabetes mellitus ([place]) 06/17/2013  . Hypoglycemia 04/08/2013  . Diabetic neuropathy, painful ([place]) 03/21/2013  . Other and unspecified hyperlipidemia 01/22/2013  . A C DEGENERATION-CHRONIC 10/27/2008  . SHOULDER PAIN 05/01/2008  . IMPINGEMENT SYNDROME 05/01/2008    Past Surgical History:  Procedure Laterality Date  . CHOLECYSTECTOMY    . CYST EXCISION    . SHOULDER OPEN ROTATOR CUFF REPAIR Left   .  TONSILLECTOMY    . TUBAL LIGATION       OB History    Gravida  6   Para  3   Term  3   Preterm      AB  3   Living  3     SAB  2   TAB      Ectopic      Multiple      Live Births  3            Home Medications    Prior to Admission medications   Medication Sig Start Date End Date Taking? Authorizing Provider  albuterol (PROVENTIL HFA;VENTOLIN HFA) 108 (90 Base) MCG/ACT inhaler Inhale 1-2 puffs into the lungs every 6 (six) hours as needed for wheezing or shortness of breath. 05/09/17   Volanda Napoleon, PA-C  aspirin EC 81 MG tablet Take 1 tablet (81 mg total) by mouth daily. 01/22/13   Dhungel, Flonnie Overman, MD  benzonatate (TESSALON) 100 MG capsule Take 1 capsule (100 mg total) by mouth every 8 (eight) hours. Patient not taking: Reported on 05/26/2017 05/09/17   [place] Lanius A, PA-C  Blood Glucose Monitoring Suppl (TRUE METRIX METER) w/Device KIT USE AS DIRECTED 05/01/15   Tresa Garter, MD  ferrous sulfate 325 (65 FE) MG tablet Take 1 tablet (325 mg total) by mouth 2 (two) times daily with a meal. 04/24/17   Charlott Rakes, MD  FLUoxetine (PROZAC) 40 MG capsule Take 1 capsule (40 mg total) by mouth daily. 04/24/17   Charlott Rakes, MD  gabapentin (NEURONTIN) 300 MG capsule Take 1 capsule (300 mg total) by mouth 3 (three) times daily. 04/24/17   Charlott Rakes, MD  glucose blood (TRUE METRIX BLOOD GLUCOSE TEST) test strip Use as instructed 10/08/15   Angelica Chessman E, MD  insulin aspart (NOVOLOG) 100 UNIT/ML injection Inject 7-12 Units into the skin 3 (three) times daily as needed for high blood sugar. 301 - 350 = 12 units, > 350, call or come to clinic 10/31/17   Charlott Rakes, MD  Insulin Glargine (LANTUS SOLOSTAR) 100 UNIT/ML Solostar Pen 50 units in the morning and 10 units in the evening 10/09/17   Fulp, Cammie, MD  lisinopril-hydrochlorothiazide (PRINZIDE,ZESTORETIC) 20-12.5 MG tablet Take 2 tablets by mouth daily. 05/26/17   Charlott Rakes, MD  metFORMIN  (GLUCOPHAGE) 1000 MG tablet Take 1 tablet (1,000 mg total) by mouth 2 (two) times daily with a meal. 04/24/17   Charlott Rakes, MD  methocarbamol (ROBAXIN) 500 MG tablet Take 1 tablet (500 mg total) by mouth 2 (two) times daily. 08/01/17   Hedges, Dellis Filbert, PA-C  metoCLOPramide (REGLAN) 10 MG tablet Take 1 tablet (10 mg total) by mouth 3 (three) times daily before meals. 04/24/17   Charlott Rakes, MD  naproxen (NAPROSYN) 500 MG tablet Take 1 tablet (500 mg total) by mouth 2 (two) times daily. 03/12/17   Petrucelli, Samantha R, PA-C  simvastatin (ZOCOR) 20 MG tablet Take 1 tablet (20 mg total) by mouth at bedtime. 10/19/17   Charlott Rakes, MD  traZODone (DESYREL) 50 MG tablet Take 1 tablet (50 mg total) by mouth at bedtime as needed for sleep. 04/24/17   Charlott Rakes, MD  triamcinolone cream (KENALOG) 0.1 % Apply 1 application topically 2 (two) times daily. 05/26/17   Charlott Rakes, MD  TRUEPLUS LANCETS 28G MISC USE AS INSTRUCTED 05/01/15   Tresa Garter, MD    Family History Family History  Problem Relation Age of Onset  . Hypertension Mother   . Thyroid disease Mother   . Irregular heart beat Mother   . Bipolar disorder Sister     Social History Social History   Tobacco Use  . Smoking status: Former Research scientist (life sciences)  . Smokeless tobacco: Never Used  . Tobacco comment: Smoked for 4 years, quit age 89  Substance Use Topics  . Alcohol use: Yes    Alcohol/week: 0.0 standard drinks    Comment: Holidays only  . Drug use: No     Allergies   Tomato   Review of Systems Review of Systems  Constitutional: Negative for fever and malaise/fatigue.  Respiratory: Negative for shortness of breath.   Cardiovascular: Negative for palpitations, orthopnea, syncope and near-syncope.  Gastrointestinal: Positive for nausea. Negative for anorexia and vomiting.  Musculoskeletal: Negative for neck pain and neck stiffness.  Neurological: Positive for headaches. Negative for dizziness, tremors,  seizures, syncope, facial asymmetry, speech difficulty, weakness, light-headedness and numbness.  All other systems reviewed and are negative.    Physical Exam Updated Vital Signs BP (!) 161/69 (BP Location: Right Arm)   Pulse 73   Temp 98.1 F (36.7 C) (Oral)   Resp 16   Ht '5\' 5"'$  (1.651 m)   Wt 92.5 kg   LMP 11/17/2017   SpO2 100%   BMI 33.95 kg/m   Physical Exam  Constitutional: She is oriented to person, place, and time. She appears well-developed and well-nourished. No distress.  HENT:  Head: Normocephalic and atraumatic.  Nose: Nose normal.  Mouth/Throat: No oropharyngeal exudate.  Eyes: Pupils are equal, round, and  reactive to light. Conjunctivae and EOM are normal.  Neck: Normal range of motion. Neck supple.  Cardiovascular: Normal rate, regular rhythm, normal heart sounds and intact distal pulses.  Pulmonary/Chest: Effort normal and breath sounds normal. No stridor. She has no wheezes. She has no rales.  Abdominal: Soft. Bowel sounds are normal. She exhibits no mass. There is no tenderness. There is no rebound and no guarding.  Musculoskeletal: Normal range of motion.  Neurological: She is alert and oriented to person, place, and time. She displays normal reflexes. No cranial nerve deficit.  Skin: Skin is warm and dry. Capillary refill takes less than 2 seconds.  Psychiatric: She has a normal mood and affect.     ED Treatments / Results  Labs (all labs ordered are listed, but only abnormal results are displayed) Results for orders placed or performed during the hospital encounter of 11/26/17  I-Stat Beta hCG blood, ED ([facility], [facility], [facility] only)  Result Value Ref Range   I-stat hCG, quantitative <5.0 <5 mIU/mL   Comment 3           No results found.  Radiology No results found.  Procedures Procedures (including critical care time)  Medications Ordered in ED Medications  ketorolac (TORADOL) 30 MG/ML injection 15 mg (has no administration in time range)    metoCLOPramide (REGLAN) injection 10 mg (has no administration in time range)  sodium chloride 0.9 % bolus 1,000 mL (has no administration in time range)  divalproex (DEPAKOTE) DR tablet 500 mg (has no administration in time range)  diphenhydrAMINE (BENADRYL) injection 12.5 mg (has no administration in time range)     Visual Acuity  Right Eye Distance:   Left Eye Distance:   Bilateral Distance:    Right Eye Near: R Near: 20/40 Left Eye Near:  L Near: 20/63 Bilateral Near:  20/20   Final Clinical Impressions(s) / ED Diagnoses   Return for fevers >100.4 unrelieved by medication, shortness of breath, intractable vomiting, or diarrhea, Inability to tolerate liquids or food, cough, altered mental status or any concerns. No signs of systemic illness or infection. The patient is nontoxic-appearing on exam and vital signs are within normal limits.   I have reviewed the triage vital signs and the nursing notes. Pertinent labs &imaging results that were available during my care of the patient were reviewed by me and considered in my medical decision making (see chart for details).  After history, exam, and medical workup I feel the patient has been appropriately medically screened and is safe for discharge home. Pertinent diagnoses were discussed with the patient. Patient was given return precautions.   Carlon Davidson, MD 11/27/17 [phone]

## 2017-12-28 ENCOUNTER — Telehealth: Payer: Self-pay | Admitting: Family Medicine

## 2017-12-28 NOTE — Telephone Encounter (Addendum)
Pt came in to drop off FMLA paperwork, please call patient when paperwork is completed, this needs to be done by 01/05/2018.Placed in PCP inbox

## 2017-12-28 NOTE — Telephone Encounter (Signed)
Paperwork has been received and patient will be contacted once paperwork is ready for pick up. 

## 2018-01-03 NOTE — Telephone Encounter (Signed)
Patient called back in regards to her paperwork and was explained paperwork takes 7-10 days to process. Patient explained that she needs paperwork done by 11/01. Please follow up

## 2018-01-05 ENCOUNTER — Telehealth: Payer: Self-pay

## 2018-01-05 ENCOUNTER — Other Ambulatory Visit: Payer: Self-pay

## 2018-01-05 MED FILL — NovoLOG 100 UNIT/ML SOLN: 100 | 83 days supply | Qty: 30 | Fill #0

## 2018-01-05 MED FILL — LANTUS SOLOSTAR 100 UNITS/M: 100 | 50 days supply | Qty: 30 | Fill #0

## 2018-01-05 NOTE — Telephone Encounter (Signed)
Error

## 2018-01-05 NOTE — Telephone Encounter (Signed)
It was brought to my attention by Mariah Miller that this patient is struggling to pay for her insulin, she has tried to fill it here and at [facility] and the cost with her insurance and using a manufacturers coupon is $99 for lantus and $159.76 for Novolog. I spoke with [name] and she approved allowing the patient to charge her medication this month and we will work to find a better option for next month. Patient was contacted and notified, she was agreeable to our plan and will be coming by this afternoon to pick up her meds

## 2018-01-08 ENCOUNTER — Telehealth: Payer: Self-pay | Admitting: Family Medicine

## 2018-01-08 NOTE — Telephone Encounter (Signed)
Gm Mrs Osburn called to give you the correct fax # [phone] attention to [facility] .

## 2018-01-08 NOTE — Telephone Encounter (Signed)
Paperwork has been faxed over.

## 2018-02-13 ENCOUNTER — Ambulatory Visit: Payer: No Typology Code available for payment source | Attending: Family Medicine | Admitting: Family Medicine

## 2018-02-13 ENCOUNTER — Encounter: Payer: Self-pay | Admitting: Family Medicine

## 2018-02-13 VITALS — BP 149/79 | HR 80 | Temp 97.9°F | Ht 65.0 in | Wt 215.6 lb

## 2018-02-13 DIAGNOSIS — E1142 Type 2 diabetes mellitus with diabetic polyneuropathy: Secondary | ICD-10-CM | POA: Diagnosis not present

## 2018-02-13 DIAGNOSIS — Z9889 Other specified postprocedural states: Secondary | ICD-10-CM | POA: Insufficient documentation

## 2018-02-13 DIAGNOSIS — F339 Major depressive disorder, recurrent, unspecified: Secondary | ICD-10-CM | POA: Insufficient documentation

## 2018-02-13 DIAGNOSIS — E1165 Type 2 diabetes mellitus with hyperglycemia: Secondary | ICD-10-CM | POA: Diagnosis not present

## 2018-02-13 DIAGNOSIS — F331 Major depressive disorder, recurrent, moderate: Secondary | ICD-10-CM

## 2018-02-13 DIAGNOSIS — Z23 Encounter for immunization: Secondary | ICD-10-CM

## 2018-02-13 DIAGNOSIS — Z7982 Long term (current) use of aspirin: Secondary | ICD-10-CM | POA: Insufficient documentation

## 2018-02-13 DIAGNOSIS — Z79899 Other long term (current) drug therapy: Secondary | ICD-10-CM | POA: Insufficient documentation

## 2018-02-13 DIAGNOSIS — I10 Essential (primary) hypertension: Secondary | ICD-10-CM | POA: Diagnosis not present

## 2018-02-13 DIAGNOSIS — Z794 Long term (current) use of insulin: Secondary | ICD-10-CM

## 2018-02-13 DIAGNOSIS — R569 Unspecified convulsions: Secondary | ICD-10-CM | POA: Insufficient documentation

## 2018-02-13 DIAGNOSIS — Z9049 Acquired absence of other specified parts of digestive tract: Secondary | ICD-10-CM | POA: Insufficient documentation

## 2018-02-13 DIAGNOSIS — Z91018 Allergy to other foods: Secondary | ICD-10-CM | POA: Insufficient documentation

## 2018-02-13 LAB — POCT GLYCOSYLATED HEMOGLOBIN (HGB A1C): Hemoglobin A1C: 11.7 % — AB (ref 4.0–5.6)

## 2018-02-13 LAB — GLUCOSE, POCT (MANUAL RESULT ENTRY): POC Glucose: 278 mg/dl — AB (ref 70–99)

## 2018-02-13 MED ORDER — FLUOXETINE HCL 40 MG PO CAPS: 40.0000 mg | ORAL_CAPSULE | Freq: Every day | ORAL | 1 refills | Status: DC

## 2018-02-13 MED ORDER — GABAPENTIN 300 MG PO CAPS: 300.0000 mg | ORAL_CAPSULE | Freq: Three times a day (TID) | ORAL | 3 refills | Status: DC

## 2018-02-13 MED ORDER — SIMVASTATIN 20 MG PO TABS: 20.0000 mg | ORAL_TABLET | Freq: Every day | ORAL | 1 refills | Status: DC

## 2018-02-13 MED ORDER — LISINOPRIL-HYDROCHLOROTHIAZIDE 20-12.5 MG PO TABS: 2.0000 | ORAL_TABLET | Freq: Every day | ORAL | 1 refills | Status: DC

## 2018-02-13 MED ORDER — METFORMIN HCL 1000 MG PO TABS: 1000.0000 mg | ORAL_TABLET | Freq: Two times a day (BID) | ORAL | 1 refills | Status: DC

## 2018-02-13 MED ORDER — INSULIN ASPART 100 UNIT/ML ~~LOC~~ SOLN: 0.0000 [IU] | Freq: Three times a day (TID) | SUBCUTANEOUS | 3 refills | Status: DC

## 2018-02-13 MED ORDER — INSULIN GLARGINE 100 UNIT/ML SOLOSTAR PEN: 42.0000 [IU] | PEN_INJECTOR | Freq: Two times a day (BID) | SUBCUTANEOUS | 5 refills | Status: DC

## 2018-02-13 NOTE — Progress Notes (Signed)
Subjective:  Patient ID: Mariah Miller, female    DOB: Jul 11, 1975  Age: 42 y.o. MRN: [id number]  CC: Diabetes   HPI Mariah Miller is a 42 year old female with a history of type 2 diabetes mellitus (A1c 11.7), hypertension, depression who presents today for follow-up visit. She endorses compliance with 35 units of Lantus twice daily as well as her sliding scale insulin but has not been compliant with a diabetic diet especially when she goes to work as she eats junk food from [employer].  She does not exercise regularly. Denies visual concerns, numbness in extremities She tolerates her antihypertensive and denies adverse effects from her medication is doing well on her antidepressant.  Denies suicidal ideation or intents. Tolerating his statin with no complaints of myalgias. She has no additional concerns today.  Past Medical History:  Diagnosis Date  . Diabetes mellitus without complication ([place])   . Hypertension   . Migraine   . Seizures ([place])    self reported    Past Surgical History:  Procedure Laterality Date  . CHOLECYSTECTOMY    . CYST EXCISION    . SHOULDER OPEN ROTATOR CUFF REPAIR Left   . TONSILLECTOMY    . TUBAL LIGATION      Allergies  Allergen Reactions  . Tomato Itching and Rash     Outpatient Medications Prior to Visit  Medication Sig Dispense Refill  . albuterol (PROVENTIL HFA;VENTOLIN HFA) 108 (90 Base) MCG/ACT inhaler Inhale 1-2 puffs into the lungs every 6 (six) hours as needed for wheezing or shortness of breath. 1 Inhaler 0  . aspirin EC 81 MG tablet Take 1 tablet (81 mg total) by mouth daily. 30 tablet 5  . Blood Glucose Monitoring Suppl (TRUE METRIX METER) w/Device KIT USE AS DIRECTED 1 kit 0  . ferrous sulfate 325 (65 FE) MG tablet Take 1 tablet (325 mg total) by mouth 2 (two) times daily with a meal. 180 tablet 3  . glucose blood (TRUE METRIX BLOOD GLUCOSE TEST) test strip Use as instructed 100 each 12  . traZODone (DESYREL) 50  MG tablet Take 1 tablet (50 mg total) by mouth at bedtime as needed for sleep. 30 tablet 3  . TRUEPLUS LANCETS 28G MISC USE AS INSTRUCTED 100 each 5  . FLUoxetine (PROZAC) 40 MG capsule Take 1 capsule (40 mg total) by mouth daily. 30 capsule 3  . gabapentin (NEURONTIN) 300 MG capsule Take 1 capsule (300 mg total) by mouth 3 (three) times daily. 90 capsule 3  . insulin aspart (NOVOLOG) 100 UNIT/ML injection Inject 7-12 Units into the skin 3 (three) times daily as needed for high blood sugar. 301 - 350 = 12 units, > 350, call or come to clinic 10 mL 3  . Insulin Glargine (LANTUS SOLOSTAR) 100 UNIT/ML Solostar Pen 50 units in the morning and 10 units in the evening (Patient taking differently: Inject 35 Units into the skin 2 (two) times daily. ) 5 pen 3  . lisinopril-hydrochlorothiazide (PRINZIDE,ZESTORETIC) 20-12.5 MG tablet Take 2 tablets by mouth daily. 60 tablet 3  . metFORMIN (GLUCOPHAGE) 1000 MG tablet Take 1 tablet (1,000 mg total) by mouth 2 (two) times daily with a meal. 180 tablet 3  . simvastatin (ZOCOR) 20 MG tablet Take 1 tablet (20 mg total) by mouth at bedtime. 90 tablet 3  . benzonatate (TESSALON) 100 MG capsule Take 1 capsule (100 mg total) by mouth every 8 (eight) hours. (Patient not taking: Reported on 05/26/2017) 21 capsule 0  .  triamcinolone cream (KENALOG) 0.1 % Apply 1 application topically 2 (two) times daily. (Patient not taking: Reported on 11/27/2017) 45 g 1  . methocarbamol (ROBAXIN) 500 MG tablet Take 1 tablet (500 mg total) by mouth 2 (two) times daily. (Patient not taking: Reported on 11/27/2017) 20 tablet 0  . metoCLOPramide (REGLAN) 10 MG tablet Take 1 tablet (10 mg total) by mouth 3 (three) times daily before meals. (Patient not taking: Reported on 11/27/2017) 90 tablet 1  . naproxen (NAPROSYN) 500 MG tablet Take 1 tablet (500 mg total) by mouth 2 (two) times daily. (Patient not taking: Reported on 11/27/2017) 30 tablet 0   No facility-administered medications prior to visit.      ROS Review of Systems  Constitutional: Negative for activity change, appetite change and fatigue.  HENT: Negative for congestion, sinus pressure and sore throat.   Eyes: Negative for visual disturbance.  Respiratory: Negative for cough, chest tightness, shortness of breath and wheezing.   Cardiovascular: Negative for chest pain and palpitations.  Gastrointestinal: Negative for abdominal distention, abdominal pain and constipation.  Endocrine: Negative for polydipsia.  Genitourinary: Negative for dysuria and frequency.  Musculoskeletal: Negative for arthralgias and back pain.  Skin: Negative for rash.  Neurological: Negative for tremors, light-headedness and numbness.  Hematological: Does not bruise/bleed easily.  Psychiatric/Behavioral: Negative for agitation and behavioral problems.    Objective:  BP (!) 149/79   Pulse 80   Temp 97.9 F (36.6 C) (Oral)   Ht '5\' 5"'$  (1.651 m)   Wt 215 lb 9.6 oz (97.8 kg)   SpO2 100%   BMI 35.88 kg/m   BP/Weight 02/13/2018 11/27/2017 06/07/4740  Systolic BP 595 638 -  Diastolic BP 79 78 -  Wt. (Lbs) 215.6 - 204  BMI 35.88 - 33.95      Physical Exam  Constitutional: She is oriented to person, place, and time. She appears well-developed and well-nourished.  Cardiovascular: Normal rate, normal heart sounds and intact distal pulses.  No murmur heard. Pulmonary/Chest: Effort normal and breath sounds normal. She has no wheezes. She has no rales. She exhibits no tenderness.  Abdominal: Soft. Bowel sounds are normal. She exhibits no distension and no mass. There is no tenderness.  Musculoskeletal: Normal range of motion.  Neurological: She is alert and oriented to person, place, and time.  Skin: Skin is warm and dry.  Psychiatric: She has a normal mood and affect.    Lab Results  Component Value Date   HGBA1C 11.7 (A) 02/13/2018     Assessment & Plan:   1. Type 2 diabetes mellitus with diabetic polyneuropathy, with long-term current  use of insulin (HCC) Controlled with A1c of 11.7 Increased dose of Lantus `Counseled on Diabetic diet, my plate method, 756 minutes of moderate intensity exercise/week Keep blood sugar logs with fasting goals of 80-120 mg/dl, random of less than 180 and in the event of sugars less than 60 mg/dl or greater than 400 mg/dl please notify the clinic ASAP. It is recommended that you undergo annual eye exams and annual foot exams. Pneumonia vaccine is recommended. - POCT glucose (manual entry) - POCT glycosylated hemoglobin (Hb A1C) - insulin aspart (NOVOLOG) 100 UNIT/ML injection; Inject 0-12 Units into the skin 3 (three) times daily. As perSliding-scale  Dispense: 30 mL; Refill: 3 - simvastatin (ZOCOR) 20 MG tablet; Take 1 tablet (20 mg total) by mouth at bedtime.  Dispense: 90 tablet; Refill: 1 - gabapentin (NEURONTIN) 300 MG capsule; Take 1 capsule (300 mg total) by mouth 3 (three)  times daily.  Dispense: 90 capsule; Refill: 3 - metFORMIN (GLUCOPHAGE) 1000 MG tablet; Take 1 tablet (1,000 mg total) by mouth 2 (two) times daily with a meal.  Dispense: 180 tablet; Refill: 1  2. Uncontrolled type 2 diabetes mellitus with hyperglycemia, with long-term current use of insulin (HCC) - Insulin Glargine (LANTUS SOLOSTAR) 100 UNIT/ML Solostar Pen; Inject 42 Units into the skin 2 (two) times daily.  Dispense: 30 mL; Refill: 5  3. Moderate episode of recurrent major depressive disorder (HCC) Stable - FLUoxetine (PROZAC) 40 MG capsule; Take 1 capsule (40 mg total) by mouth daily.  Dispense: 90 capsule; Refill: 1  4. Essential hypertension Slightly elevated above goal Counseled on blood pressure goal of less than 130/80, low-sodium, DASH diet, medication compliance, 150 minutes of moderate intensity exercise per week. Discussed medication compliance, adverse effects. - lisinopril-hydrochlorothiazide (PRINZIDE,ZESTORETIC) 20-12.5 MG tablet; Take 2 tablets by mouth daily.  Dispense: 180 tablet; Refill:  1   Meds ordered this encounter  Medications  . Insulin Glargine (LANTUS SOLOSTAR) 100 UNIT/ML Solostar Pen    Sig: Inject 42 Units into the skin 2 (two) times daily.    Dispense:  30 mL    Refill:  5    Discontinue previous dose  . insulin aspart (NOVOLOG) 100 UNIT/ML injection    Sig: Inject 0-12 Units into the skin 3 (three) times daily. As perSliding-scale    Dispense:  30 mL    Refill:  3  . simvastatin (ZOCOR) 20 MG tablet    Sig: Take 1 tablet (20 mg total) by mouth at bedtime.    Dispense:  90 tablet    Refill:  1  . FLUoxetine (PROZAC) 40 MG capsule    Sig: Take 1 capsule (40 mg total) by mouth daily.    Dispense:  90 capsule    Refill:  1  . gabapentin (NEURONTIN) 300 MG capsule    Sig: Take 1 capsule (300 mg total) by mouth 3 (three) times daily.    Dispense:  90 capsule    Refill:  3  . lisinopril-hydrochlorothiazide (PRINZIDE,ZESTORETIC) 20-12.5 MG tablet    Sig: Take 2 tablets by mouth daily.    Dispense:  180 tablet    Refill:  1  . metFORMIN (GLUCOPHAGE) 1000 MG tablet    Sig: Take 1 tablet (1,000 mg total) by mouth 2 (two) times daily with a meal.    Dispense:  180 tablet    Refill:  1    Follow-up: Return in about 3 months (around 05/15/2018) for Follow-up of chronic medical conditions.   Charlott Rakes MD

## 2018-05-16 ENCOUNTER — Encounter: Payer: Self-pay | Admitting: Family Medicine

## 2018-05-16 ENCOUNTER — Ambulatory Visit: Payer: No Typology Code available for payment source | Attending: Family Medicine | Admitting: Family Medicine

## 2018-05-16 ENCOUNTER — Other Ambulatory Visit: Payer: Self-pay

## 2018-05-16 VITALS — BP 125/75 | HR 87 | Temp 98.2°F | Ht 65.0 in | Wt 210.0 lb

## 2018-05-16 DIAGNOSIS — E1142 Type 2 diabetes mellitus with diabetic polyneuropathy: Secondary | ICD-10-CM | POA: Diagnosis not present

## 2018-05-16 DIAGNOSIS — F419 Anxiety disorder, unspecified: Secondary | ICD-10-CM

## 2018-05-16 DIAGNOSIS — R059 Cough, unspecified: Secondary | ICD-10-CM

## 2018-05-16 DIAGNOSIS — Z794 Long term (current) use of insulin: Secondary | ICD-10-CM

## 2018-05-16 DIAGNOSIS — F329 Major depressive disorder, single episode, unspecified: Secondary | ICD-10-CM

## 2018-05-16 DIAGNOSIS — F5101 Primary insomnia: Secondary | ICD-10-CM

## 2018-05-16 DIAGNOSIS — R05 Cough: Secondary | ICD-10-CM

## 2018-05-16 LAB — POCT GLYCOSYLATED HEMOGLOBIN (HGB A1C): HbA1c, POC (controlled diabetic range): 12.8 % — AB (ref 0.0–7.0)

## 2018-05-16 LAB — GLUCOSE, POCT (MANUAL RESULT ENTRY): POC GLUCOSE: 370 mg/dL — AB (ref 70–99)

## 2018-05-16 MED ORDER — TRUEPLUS LANCETS 28G MISC: 5 refills | Status: DC

## 2018-05-16 MED ORDER — CETIRIZINE HCL 10 MG PO TABS: 10.0000 mg | ORAL_TABLET | Freq: Every day | ORAL | 1 refills | Status: DC

## 2018-05-16 MED ORDER — TRAZODONE HCL 100 MG PO TABS: 100.0000 mg | ORAL_TABLET | Freq: Every evening | ORAL | 3 refills | Status: DC | PRN

## 2018-05-16 MED ORDER — TRUE METRIX METER W/DEVICE KIT: PACK | 0 refills | Status: DC

## 2018-05-16 MED ORDER — INSULIN ASPART 100 UNIT/ML ~~LOC~~ SOLN: 0.0000 [IU] | Freq: Three times a day (TID) | SUBCUTANEOUS | 3 refills | Status: DC

## 2018-05-16 MED ORDER — GLUCOSE BLOOD VI STRP: ORAL_STRIP | 12 refills | Status: DC

## 2018-05-16 MED ORDER — BENZONATATE 100 MG PO CAPS: 100.0000 mg | ORAL_CAPSULE | Freq: Three times a day (TID) | ORAL | 0 refills | Status: DC

## 2018-05-16 MED ORDER — TRAZODONE HCL 50 MG PO TABS: 50.0000 mg | ORAL_TABLET | Freq: Every evening | ORAL | 3 refills | Status: DC | PRN

## 2018-05-16 MED ORDER — LIRAGLUTIDE 18 MG/3ML ~~LOC~~ SOPN: PEN_INJECTOR | SUBCUTANEOUS | 3 refills | Status: DC

## 2018-05-16 NOTE — Patient Instructions (Signed)

## 2018-05-16 NOTE — Progress Notes (Signed)
 Subjective:  Patient ID: Mariah Miller, female    DOB: 10/16/1975  Age: 43 y.o. MRN: [id number]  CC: Diabetes   HPI Mariah Miller is a 43-year-old female with a history of type 2 diabetes mellitus (A1c 12.8), hypertension, depression who presents today for follow-up visit. Her A1c is 12.8 which is up from 11.7 previously and she endorses compliance with Lantus but not with a diabetic diet.  Her fasting sugars are usually in the 270 range. She denies numbness in extremities or hypoglycemia.  She complains of insomnia which is not controlled on current dose of trazodone.  Also endorses some periods of low moods due to recent passing of some family members but she states she is coping well.  Denies suicidal ideations or intents. She has had a cough which has been on and off for the last 1 to 2 months.  Cough is dry and worse at night with associated shortness of breath when she coughs but dyspnea resolves shortly after.  Denies wheezing, chest pain, facial pressure, fever. Her boyfriend smokes and she is unsure if this is contributing to her symptoms.  Past Medical History:  Diagnosis Date  . Diabetes mellitus without complication (HCC)   . Hypertension   . Migraine   . Seizures (HCC)    self reported    Past Surgical History:  Procedure Laterality Date  . CHOLECYSTECTOMY    . CYST EXCISION    . SHOULDER OPEN ROTATOR CUFF REPAIR Left   . TONSILLECTOMY    . TUBAL LIGATION      Family History  Problem Relation Age of Onset  . Hypertension Mother   . Thyroid disease Mother   . Irregular heart beat Mother   . Bipolar disorder Sister     Allergies  Allergen Reactions  . Tomato Itching and Rash    Outpatient Medications Prior to Visit  Medication Sig Dispense Refill  . albuterol (PROVENTIL HFA;VENTOLIN HFA) 108 (90 Base) MCG/ACT inhaler Inhale 1-2 puffs into the lungs every 6 (six) hours as needed for wheezing or shortness of breath. 1 Inhaler 0  . aspirin EC 81 MG  tablet Take 1 tablet (81 mg total) by mouth daily. 30 tablet 5  . ferrous sulfate 325 (65 FE) MG tablet Take 1 tablet (325 mg total) by mouth 2 (two) times daily with a meal. 180 tablet 3  . FLUoxetine (PROZAC) 40 MG capsule Take 1 capsule (40 mg total) by mouth daily. 90 capsule 1  . gabapentin (NEURONTIN) 300 MG capsule Take 1 capsule (300 mg total) by mouth 3 (three) times daily. 90 capsule 3  . Insulin Glargine (LANTUS SOLOSTAR) 100 UNIT/ML Solostar Pen Inject 42 Units into the skin 2 (two) times daily. 30 mL 5  . lisinopril-hydrochlorothiazide (PRINZIDE,ZESTORETIC) 20-12.5 MG tablet Take 2 tablets by mouth daily. 180 tablet 1  . metFORMIN (GLUCOPHAGE) 1000 MG tablet Take 1 tablet (1,000 mg total) by mouth 2 (two) times daily with a meal. 180 tablet 1  . simvastatin (ZOCOR) 20 MG tablet Take 1 tablet (20 mg total) by mouth at bedtime. 90 tablet 1  . Blood Glucose Monitoring Suppl (TRUE METRIX METER) w/Device KIT USE AS DIRECTED 1 kit 0  . glucose blood (TRUE METRIX BLOOD GLUCOSE TEST) test strip Use as instructed 100 each 12  . insulin aspart (NOVOLOG) 100 UNIT/ML injection Inject 0-12 Units into the skin 3 (three) times daily. As perSliding-scale 30 mL 3  . TRUEPLUS LANCETS 28G MISC USE AS INSTRUCTED 100 each   5  . triamcinolone cream (KENALOG) 0.1 % Apply 1 application topically 2 (two) times daily. (Patient not taking: Reported on 11/27/2017) 45 g 1  . benzonatate (TESSALON) 100 MG capsule Take 1 capsule (100 mg total) by mouth every 8 (eight) hours. (Patient not taking: Reported on 05/26/2017) 21 capsule 0  . traZODone (DESYREL) 50 MG tablet Take 1 tablet (50 mg total) by mouth at bedtime as needed for sleep. (Patient not taking: Reported on 05/16/2018) 30 tablet 3   No facility-administered medications prior to visit.      ROS Review of Systems  Constitutional: Negative for activity change, appetite change and fatigue.  HENT: Negative for congestion, sinus pressure and sore throat.    Eyes: Negative for visual disturbance.  Respiratory: Positive for cough. Negative for chest tightness, shortness of breath and wheezing.   Cardiovascular: Negative for chest pain and palpitations.  Gastrointestinal: Negative for abdominal distention, abdominal pain and constipation.  Endocrine: Negative for polydipsia.  Genitourinary: Negative for dysuria and frequency.  Musculoskeletal: Negative for arthralgias and back pain.  Skin: Negative for rash.  Neurological: Negative for tremors, light-headedness and numbness.  Hematological: Does not bruise/bleed easily.  Psychiatric/Behavioral: Negative for agitation and behavioral problems.    Objective:  BP 125/75   Pulse 87   Temp 98.2 F (36.8 C) (Oral)   Ht 5' 5" (1.651 m)   Wt 210 lb (95.3 kg)   SpO2 99%   BMI 34.95 kg/m   BP/Weight 05/16/2018 02/13/2018 3/00/7622  Systolic BP 633 354 562  Diastolic BP 75 79 78  Wt. (Lbs) 210 215.6 -  BMI 34.95 35.88 -      Physical Exam Constitutional:      Appearance: She is well-developed.  HENT:     Right Ear: Tympanic membrane normal.     Left Ear: Tympanic membrane normal.     Nose: No rhinorrhea.     Mouth/Throat:     Pharynx: Oropharynx is clear.  Cardiovascular:     Rate and Rhythm: Normal rate.     Heart sounds: Normal heart sounds. No murmur.  Pulmonary:     Effort: Pulmonary effort is normal.     Breath sounds: Normal breath sounds. No wheezing or rales.  Chest:     Chest wall: No tenderness.  Abdominal:     General: Bowel sounds are normal. There is no distension.     Palpations: Abdomen is soft. There is no mass.     Tenderness: There is no abdominal tenderness.  Musculoskeletal: Normal range of motion.  Neurological:     Mental Status: She is alert and oriented to person, place, and time.  Psychiatric:        Mood and Affect: Mood normal.        Behavior: Behavior normal.     CMP Latest Ref Rng & Units 10/05/2017 05/09/2017 04/24/2017  Glucose 70 - 99 mg/dL  488(H) 388(H) 289(H)  BUN 6 - 20 mg/dL _0 Creatinine 0.44 - 1.00 mg/dL 0.80 0.87 0.86  Sodium 135 - 145 mmol/L 133(L) 135 136  Potassium 3.5 - 5.1 mmol/L 3.9 4.2 4.5  Chloride 98 - 111 mmol/L 101 102 101  CO2 22 - 32 mmol/L _1 Calcium 8.9 - 10.3 mg/dL 8.9 8.8(L) 9.0  Total Protein 6.5 - 8.1 g/dL 6.9 - 6.8  Total Bilirubin 0.3 - 1.2 mg/dL 0.8 - 0.2  Alkaline Phos 38 - 126 U/L 67 - 72  AST 15 - 41 U/L 15 -  12  ALT 0 - 44 U/L 14 - 12    Lipid Panel     Component Value Date/Time   CHOL 171 04/24/2017 0918   TRIG 104 04/24/2017 0918   HDL 41 04/24/2017 0918   CHOLHDL 4.2 04/24/2017 0918   CHOLHDL 4.7 05/25/2016 1543   VLDL 37 (H) 05/25/2016 1543   LDLCALC 109 (H) 04/24/2017 0918    CBC    Component Value Date/Time   WBC 5.6 10/05/2017 1628   RBC 4.04 10/05/2017 1628   HGB 12.1 10/05/2017 1628   HCT 36.6 10/05/2017 1628   PLT 329 10/05/2017 1628   MCV 90.6 10/05/2017 1628   MCH 30.0 10/05/2017 1628   MCHC 33.1 10/05/2017 1628   RDW 13.0 10/05/2017 1628   LYMPHSABS 2.4 10/05/2017 1628   MONOABS 0.5 10/05/2017 1628   EOSABS 0.1 10/05/2017 1628   BASOSABS 0.0 10/05/2017 1628    Lab Results  Component Value Date   HGBA1C 12.8 (A) 05/16/2018    Assessment & Plan:   1. Type 2 diabetes mellitus with diabetic polyneuropathy, with long-term current use of insulin (HCC) Uncontrolled with A1c of 12.8 Victoza added to regimen Emphasized the need to be compliant with a diabetic diet and lifestyle modifications - POCT glucose (manual entry) - POCT glycosylated hemoglobin (Hb A1C) - Blood Glucose Monitoring Suppl (TRUE METRIX METER) w/Device KIT; USE AS DIRECTED  Dispense: 1 kit; Refill: 0 - glucose blood (TRUE METRIX BLOOD GLUCOSE TEST) test strip; Use as instructed  Dispense: 100 each; Refill: 12 - TRUEplus Lancets 28G MISC; USE AS INSTRUCTED  Dispense: 100 each; Refill: 5 - liraglutide (VICTOZA) 18 MG/3ML SOPN; Inject subcutaneously daily 0.6 mg for 1 week then  1.2 mg for 1 week then 1.8 mg thereafter  Dispense: 30 mL; Refill: 3 - insulin aspart (NOVOLOG) 100 UNIT/ML injection; Inject 0-12 Units into the skin 3 (three) times daily. As perSliding-scale  Dispense: 30 mL; Refill: 3 - CMP14+EGFR - Lipid panel - Microalbumin/Creatinine Ratio, Urine  2. Primary insomnia Increase trazodone to 100 mg Sleep hygiene - traZODone (DESYREL) 50 MG tablet; Take 1 tablet (50 mg total) by mouth at bedtime as needed for sleep.  Dispense: 30 tablet; Refill: 3  3. Cough We will treat for sinus etiology - cetirizine (ZYRTEC) 10 MG tablet; Take 1 tablet (10 mg total) by mouth daily.  Dispense: 30 tablet; Refill: 1 - benzonatate (TESSALON) 100 MG capsule; Take 1 capsule (100 mg total) by mouth every 8 (eight) hours.  Dispense: 21 capsule; Refill: 0  4. Anxiety and depression Exacerbated by recent loss of family members She states she is coping well No additional intervention at this time as she declines grief counseling   Meds ordered this encounter  Medications  . Blood Glucose Monitoring Suppl (TRUE METRIX METER) w/Device KIT    Sig: USE AS DIRECTED    Dispense:  1 kit    Refill:  0  . glucose blood (TRUE METRIX BLOOD GLUCOSE TEST) test strip    Sig: Use as instructed    Dispense:  100 each    Refill:  12  . TRUEplus Lancets 28G MISC    Sig: USE AS INSTRUCTED    Dispense:  100 each    Refill:  5  . traZODone (DESYREL) 50 MG tablet    Sig: Take 1 tablet (50 mg total) by mouth at bedtime as needed for sleep.    Dispense:  30 tablet    Refill:  3  . liraglutide (VICTOZA)  18 MG/3ML SOPN    Sig: Inject subcutaneously daily 0.6 mg for 1 week then 1.2 mg for 1 week then 1.8 mg thereafter    Dispense:  30 mL    Refill:  3  . cetirizine (ZYRTEC) 10 MG tablet    Sig: Take 1 tablet (10 mg total) by mouth daily.    Dispense:  30 tablet    Refill:  1  . benzonatate (TESSALON) 100 MG capsule    Sig: Take 1 capsule (100 mg total) by mouth every 8 (eight)  hours.    Dispense:  21 capsule    Refill:  0  . insulin aspart (NOVOLOG) 100 UNIT/ML injection    Sig: Inject 0-12 Units into the skin 3 (three) times daily. As perSliding-scale    Dispense:  30 mL    Refill:  3    Follow-up: Return in about 3 months (around 08/16/2018) for Follow-up of chronic medical conditions.       Enobong Newlin, MD, FAAFP. [facility] [place], [place] [phone]   05/16/2018, 9:41 AM 

## 2018-05-16 NOTE — Progress Notes (Signed)
Trazodone not working for sleeping.

## 2018-05-17 ENCOUNTER — Other Ambulatory Visit: Payer: Self-pay | Admitting: Family Medicine

## 2018-05-17 DIAGNOSIS — Z794 Long term (current) use of insulin: Principal | ICD-10-CM

## 2018-05-17 DIAGNOSIS — E1142 Type 2 diabetes mellitus with diabetic polyneuropathy: Secondary | ICD-10-CM

## 2018-05-17 LAB — CMP14+EGFR
ALBUMIN: 4.1 g/dL (ref 3.8–4.8)
ALK PHOS: 81 IU/L (ref 39–117)
ALT: 13 IU/L (ref 0–32)
AST: 16 IU/L (ref 0–40)
Albumin/Globulin Ratio: 1.3 (ref 1.2–2.2)
BUN / CREAT RATIO: 12 (ref 9–23)
BUN: 12 mg/dL (ref 6–24)
Bilirubin Total: 0.2 mg/dL (ref 0.0–1.2)
CALCIUM: 9.2 mg/dL (ref 8.7–10.2)
CO2: 22 mmol/L (ref 20–29)
CREATININE: 1.04 mg/dL — AB (ref 0.57–1.00)
Chloride: 98 mmol/L (ref 96–106)
GFR calc Af Amer: 77 mL/min/{1.73_m2} (ref >59)
GFR, EST NON AFRICAN AMERICAN: 67 mL/min/{1.73_m2} (ref >59)
GLUCOSE: 357 mg/dL — AB (ref 65–99)
Globulin, Total: 3.1 g/dL (ref 1.5–4.5)
Potassium: 4.6 mmol/L (ref 3.5–5.2)
Sodium: 135 mmol/L (ref 134–144)
Total Protein: 7.2 g/dL (ref 6.0–8.5)

## 2018-05-17 LAB — LIPID PANEL
CHOLESTEROL TOTAL: 212 mg/dL — AB (ref 100–199)
Chol/HDL Ratio: 4.7 ratio — ABNORMAL HIGH (ref 0.0–4.4)
HDL: 45 mg/dL (ref >39)
LDL CALC: 133 mg/dL — AB (ref 0–99)
Triglycerides: 172 mg/dL — ABNORMAL HIGH (ref 0–149)
VLDL CHOLESTEROL CAL: 34 mg/dL (ref 5–40)

## 2018-05-17 MED ORDER — SIMVASTATIN 40 MG PO TABS: 40.0000 mg | ORAL_TABLET | Freq: Every day | ORAL | 3 refills | Status: DC

## 2018-05-17 MED FILL — TRUE METRIX TEST STRIP: 30 days supply | Qty: 100 | Fill #0

## 2018-05-17 MED FILL — SIMVASTATIN 40 MG TABLET: 40 | 30 days supply | Qty: 30 | Fill #0

## 2018-05-17 MED FILL — !VICTOZA 18MG/3ML INJECT: 18 | 28 days supply | Qty: 3 | Fill #0

## 2018-05-17 MED FILL — TRUEplus LANCETS 28G MISC: 30 days supply | Qty: 100 | Fill #0

## 2018-05-17 MED FILL — !TRUE METRIX BLOOD GLUCOSE: 1 days supply | Qty: 1 | Fill #0

## 2018-05-17 MED FILL — traZODone HCL 100 MG TABS: 100 | 30 days supply | Qty: 30 | Fill #0

## 2018-05-17 MED FILL — !NOVOLOG 100UNITS/ML VIAL: 100/ML | 27 days supply | Qty: 10 | Fill #0

## 2018-05-17 MED FILL — BENZONATATE 100 MG CAP: 100 | 7 days supply | Qty: 21 | Fill #0

## 2018-05-29 MED FILL — $LANTUS SOLOSTAR 100 UNITS/: 100 | 50 days supply | Qty: 30 | Fill #1

## 2018-06-04 MED FILL — LISINOPRIL-HCTZ 20-12.5 MG: 20-12.5 | 30 days supply | Qty: 60 | Fill #0

## 2018-06-04 MED FILL — FLUoxetine HCL 40 MG CAPS: 40 | 30 days supply | Qty: 30 | Fill #0

## 2018-06-04 MED FILL — GABAPENTIN 300 MG CAPSULE: 300 | 30 days supply | Qty: 90 | Fill #0

## 2018-06-17 ENCOUNTER — Encounter (HOSPITAL_COMMUNITY): Payer: Self-pay | Admitting: Emergency Medicine

## 2018-06-17 ENCOUNTER — Emergency Department (HOSPITAL_COMMUNITY): Payer: PRIVATE HEALTH INSURANCE

## 2018-06-17 ENCOUNTER — Emergency Department (HOSPITAL_COMMUNITY)
Admission: EM | Admit: 2018-06-17 | Discharge: 2018-06-18 | Disposition: A | Discharge status: Home / Self Care | Payer: PRIVATE HEALTH INSURANCE | Attending: Emergency Medicine | Admitting: Emergency Medicine

## 2018-06-17 ENCOUNTER — Other Ambulatory Visit: Payer: Self-pay

## 2018-06-17 DIAGNOSIS — Z7982 Long term (current) use of aspirin: Secondary | ICD-10-CM | POA: Diagnosis not present

## 2018-06-17 DIAGNOSIS — Z87891 Personal history of nicotine dependence: Secondary | ICD-10-CM | POA: Diagnosis not present

## 2018-06-17 DIAGNOSIS — E119 Type 2 diabetes mellitus without complications: Secondary | ICD-10-CM | POA: Diagnosis not present

## 2018-06-17 DIAGNOSIS — R1012 Left upper quadrant pain: Secondary | ICD-10-CM | POA: Diagnosis present

## 2018-06-17 DIAGNOSIS — Z79899 Other long term (current) drug therapy: Secondary | ICD-10-CM | POA: Insufficient documentation

## 2018-06-17 DIAGNOSIS — Z794 Long term (current) use of insulin: Secondary | ICD-10-CM | POA: Diagnosis not present

## 2018-06-17 DIAGNOSIS — I10 Essential (primary) hypertension: Secondary | ICD-10-CM | POA: Diagnosis not present

## 2018-06-17 DIAGNOSIS — K2900 Acute gastritis without bleeding: Secondary | ICD-10-CM | POA: Insufficient documentation

## 2018-06-17 LAB — COMPREHENSIVE METABOLIC PANEL
ALT: 13 U/L (ref 0–44)
AST: 16 U/L (ref 15–41)
Albumin: 3.4 g/dL — ABNORMAL LOW (ref 3.5–5.0)
Alkaline Phosphatase: 57 U/L (ref 38–126)
Anion gap: 8 (ref 5–15)
BUN: 7 mg/dL (ref 6–20)
CO2: 21 mmol/L — ABNORMAL LOW (ref 22–32)
Calcium: 8.8 mg/dL — ABNORMAL LOW (ref 8.9–10.3)
Chloride: 106 mmol/L (ref 98–111)
Creatinine, Ser: 0.9 mg/dL (ref 0.44–1.00)
GFR calc Af Amer: >60 mL/min (ref >60)
GFR calc non Af Amer: >60 mL/min (ref >60)
Glucose, Bld: 163 mg/dL — ABNORMAL HIGH (ref 70–99)
Potassium: 3.8 mmol/L (ref 3.5–5.1)
Sodium: 135 mmol/L (ref 135–145)
Total Bilirubin: 0.5 mg/dL (ref 0.3–1.2)
Total Protein: 6.7 g/dL (ref 6.5–8.1)

## 2018-06-17 LAB — CBC
HCT: 38.2 % (ref 36.0–46.0)
Hemoglobin: 12.1 g/dL (ref 12.0–15.0)
MCH: 28.8 pg (ref 26.0–34.0)
MCHC: 31.7 g/dL (ref 30.0–36.0)
MCV: 91 fL (ref 80.0–100.0)
Platelets: 334 10*3/uL (ref 150–400)
RBC: 4.2 MIL/uL (ref 3.87–5.11)
RDW: 12.9 % (ref 11.5–15.5)
WBC: 6.3 10*3/uL (ref 4.0–10.5)
nRBC: 0 % (ref 0.0–0.2)

## 2018-06-17 LAB — URINALYSIS, ROUTINE W REFLEX MICROSCOPIC
Bacteria, UA: NONE SEEN
Bilirubin Urine: NEGATIVE
Glucose, UA: NEGATIVE mg/dL
Hgb urine dipstick: NEGATIVE
Ketones, ur: NEGATIVE mg/dL
Leukocytes,Ua: NEGATIVE
Nitrite: NEGATIVE
Protein, ur: 30 mg/dL — AB
Specific Gravity, Urine: 1.029 (ref 1.005–1.030)
pH: 5 (ref 5.0–8.0)

## 2018-06-17 LAB — LIPASE, BLOOD: Lipase: 39 U/L (ref 11–51)

## 2018-06-17 LAB — I-STAT BETA HCG BLOOD, ED (MC, WL, AP ONLY): I-stat hCG, quantitative: <5 m[IU]/mL (ref <5)

## 2018-06-17 MED ORDER — ALUM & MAG HYDROXIDE-SIMETH 200-200-20 MG/5ML PO SUSP
30.0000 mL | Freq: Once | ORAL | Status: AC
  Administered 2018-06-17: 30 mL via ORAL
  Filled 2018-06-17: qty 30

## 2018-06-17 MED ORDER — LIDOCAINE VISCOUS HCL 2 % MT SOLN
15.0000 mL | Freq: Once | OROMUCOSAL | Status: AC
  Administered 2018-06-17: 15 mL via ORAL
  Filled 2018-06-17: qty 15

## 2018-06-17 MED ORDER — PANTOPRAZOLE SODIUM 40 MG PO TBEC
40.0000 mg | DELAYED_RELEASE_TABLET | Freq: Once | ORAL | Status: AC
  Administered 2018-06-17: 40 mg via ORAL
  Filled 2018-06-17: qty 1

## 2018-06-17 NOTE — ED Notes (Signed)
Patient transported to X-ray 

## 2018-06-17 NOTE — ED Triage Notes (Signed)
From home with achy LUQ pain X 2-3 weeks. Pt states she has tried multiple medications without relief.

## 2018-06-17 NOTE — ED Provider Notes (Signed)
[facility] EMERGENCY DEPARTMENT Provider Note   CSN: [id number] Arrival date & time: 06/17/18  2145    History   Chief Complaint Chief Complaint  Patient presents with  . Abdominal Pain    HPI ADYA WIRZ is a 43 y.o. female with a hx of IDDM, HTN, migraine, seizures,  presents to the Emergency Department complaining of gradual,waxing and waning, progressively worsening LUQ abd pain onset 2-3 weeks ago.  Pt describes the pain as stabbing and burning, rated at a 8/10 at its worst, but most days a 3-5/10. Pt reports decreased appetite but no nausea, vomiting, hematemesis, melena or hematochezia.  Pt reports the pain seems to worsen several hours after eating.  Occasionally, the pain radiates to her back, but this is intermittent.  Nothing seems to make the symptoms better.  Pt reports a remote hx of PUD, but was never given any medication or referred to GI.  Pt reports this pain is similar to her pain at that time.  Pt reports taking ASA 4m daily, but denies other NSAIDs, EtOH usage, smoking or drug usage.  Pt denies fever, chills, headache, neck pain, chest pain, SOB, other abd pain, N/V/D, weakness, dizziness, syncope, dysuria, hematuria.  Pt reports a surgical hx of cholecystectomy and BTL.        The history is provided by the patient and medical records. No language interpreter was used.    Past Medical History:  Diagnosis Date  . Diabetes mellitus without complication ([place]   . Hypertension   . Migraine   . Seizures ([place]    self reported    Patient Active Problem List   Diagnosis Date Noted  . Eczema 05/26/2017  . Primary insomnia 09/28/2016  . Anxiety and depression 05/26/2016  . OSA (obstructive sleep apnea) 04/30/2015  . Chest pain 04/28/2015  . DM2 (diabetes mellitus, type 2) ([place] 04/28/2015  . ASCUS with positive high risk HPV cervical 06/06/2014  . Anemia, iron deficiency 03/17/2014  . Dyslipidemia 03/17/2014  . Essential  hypertension 03/17/2014  . Uncontrolled diabetes mellitus ([place] 06/17/2013  . Hypoglycemia 04/08/2013  . Diabetic neuropathy, painful ([place] 03/21/2013  . Other and unspecified hyperlipidemia 01/22/2013  . A C DEGENERATION-CHRONIC 10/27/2008  . SHOULDER PAIN 05/01/2008  . IMPINGEMENT SYNDROME 05/01/2008    Past Surgical History:  Procedure Laterality Date  . CHOLECYSTECTOMY    . CYST EXCISION    . SHOULDER OPEN ROTATOR CUFF REPAIR Left   . TONSILLECTOMY    . TUBAL LIGATION       OB History    Gravida  6   Para  3   Term  3   Preterm      AB  3   Living  3     SAB  2   TAB      Ectopic      Multiple      Live Births  3            Home Medications    Prior to Admission medications   Medication Sig Start Date End Date Taking? Authorizing Provider  albuterol (PROVENTIL HFA;VENTOLIN HFA) 108 (90 Base) MCG/ACT inhaler Inhale 1-2 puffs into the lungs every 6 (six) hours as needed for wheezing or shortness of breath. 05/09/17  Yes LVolanda Napoleon PA-C  aspirin EC 81 MG tablet Take 1 tablet (81 mg total) by mouth daily. 01/22/13  Yes Dhungel, Nishant, MD  Blood Glucose Monitoring Suppl (TRUE METRIX METER) w/Device KIT USE AS  DIRECTED 05/16/18  Yes Charlott Rakes, MD  cetirizine (ZYRTEC) 10 MG tablet Take 1 tablet (10 mg total) by mouth daily. Patient taking differently: Take 10 mg by mouth daily as needed for allergies.  05/16/18  Yes Charlott Rakes, MD  ferrous sulfate 325 (65 FE) MG tablet Take 1 tablet (325 mg total) by mouth 2 (two) times daily with a meal. 04/24/17  Yes Newlin, Enobong, MD  FLUoxetine (PROZAC) 40 MG capsule Take 1 capsule (40 mg total) by mouth daily. 02/13/18  Yes Charlott Rakes, MD  gabapentin (NEURONTIN) 300 MG capsule Take 1 capsule (300 mg total) by mouth 3 (three) times daily. 02/13/18  Yes Newlin, Charlane Ferretti, MD  glucose blood (TRUE METRIX BLOOD GLUCOSE TEST) test strip Use as instructed 05/16/18  Yes Newlin, Enobong, MD  insulin aspart  (NOVOLOG) 100 UNIT/ML injection Inject 0-12 Units into the skin 3 (three) times daily. As perSliding-scale 05/16/18  Yes Newlin, Charlane Ferretti, MD  Insulin Glargine (LANTUS SOLOSTAR) 100 UNIT/ML Solostar Pen Inject 42 Units into the skin 2 (two) times daily. Patient taking differently: Inject 42 Units into the skin at bedtime.  02/13/18  Yes Newlin, Charlane Ferretti, MD  liraglutide (VICTOZA) 18 MG/3ML SOPN Inject subcutaneously daily 0.6 mg for 1 week then 1.2 mg for 1 week then 1.8 mg thereafter Patient taking differently: Inject 1.8 mg into the skin every morning.  05/16/18  Yes Charlott Rakes, MD  lisinopril-hydrochlorothiazide (PRINZIDE,ZESTORETIC) 20-12.5 MG tablet Take 2 tablets by mouth daily. 02/13/18  Yes Charlott Rakes, MD  metFORMIN (GLUCOPHAGE) 1000 MG tablet Take 1 tablet (1,000 mg total) by mouth 2 (two) times daily with a meal. 02/13/18  Yes Newlin, Enobong, MD  simvastatin (ZOCOR) 40 MG tablet Take 1 tablet (40 mg total) by mouth at bedtime. 05/17/18  Yes Charlott Rakes, MD  traZODone (DESYREL) 100 MG tablet Take 1 tablet (100 mg total) by mouth at bedtime as needed for sleep. 05/16/18  Yes Charlott Rakes, MD  TRUEplus Lancets 28G MISC USE AS INSTRUCTED 05/16/18  Yes Newlin, Charlane Ferretti, MD  benzonatate (TESSALON) 100 MG capsule Take 1 capsule (100 mg total) by mouth every 8 (eight) hours. Patient not taking: Reported on 06/17/2018 05/16/18   Charlott Rakes, MD  famotidine (PEPCID) 20 MG tablet Take 1 tablet (20 mg total) by mouth 2 (two) times daily. 06/18/18   Reaghan Kawa, Jarrett Soho, PA-C  pantoprazole (PROTONIX) 20 MG tablet Take 1 tablet (20 mg total) by mouth daily. 06/18/18   Raychell Holcomb, Jarrett Soho, PA-C  sucralfate (CARAFATE) 1 g tablet Take 1 tablet (1 g total) by mouth 4 (four) times daily -  with meals and at bedtime. 06/18/18   Clent Damore, Jarrett Soho, PA-C  triamcinolone cream (KENALOG) 0.1 % Apply 1 application topically 2 (two) times daily. Patient not taking: Reported on 11/27/2017 05/26/17   Charlott Rakes, MD    Family History Family History  Problem Relation Age of Onset  . Hypertension Mother   . Thyroid disease Mother   . Irregular heart beat Mother   . Bipolar disorder Sister     Social History Social History   Tobacco Use  . Smoking status: Former Research scientist (life sciences)  . Smokeless tobacco: Never Used  . Tobacco comment: Smoked for 4 years, quit age 43  Substance Use Topics  . Alcohol use: Yes    Alcohol/week: 0.0 standard drinks    Comment: Holidays only  . Drug use: No     Allergies   Tomato   Review of Systems Review of Systems  Constitutional: Negative for appetite change,  diaphoresis, fatigue, fever and unexpected weight change.  HENT: Negative for mouth sores.   Eyes: Negative for visual disturbance.  Respiratory: Negative for cough, chest tightness, shortness of breath and wheezing.   Cardiovascular: Negative for chest pain.  Gastrointestinal: Positive for abdominal pain. Negative for constipation, diarrhea, nausea and vomiting.  Endocrine: Negative for polydipsia, polyphagia and polyuria.  Genitourinary: Negative for dysuria, frequency, hematuria and urgency.  Musculoskeletal: Negative for back pain and neck stiffness.  Skin: Negative for rash.  Allergic/Immunologic: Negative for immunocompromised state.  Neurological: Negative for syncope, light-headedness and headaches.  Hematological: Does not bruise/bleed easily.  Psychiatric/Behavioral: Negative for sleep disturbance. The patient is not nervous/anxious.      Physical Exam Updated Vital Signs BP 132/79   Pulse 82   Temp 98 F (36.7 C) (Oral)   Resp 12   Ht _0  (1.651 m)   Wt 90.7 kg   LMP 06/04/2018 Comment: tubal ligation 13 yrs  SpO2 100%   BMI 33.28 kg/m   Physical Exam Vitals signs and nursing note reviewed.  Constitutional:      General: She is not in acute distress.    Appearance: She is well-developed. She is not diaphoretic.     Comments: Awake, alert, nontoxic appearance  HENT:      Head: Normocephalic and atraumatic.     Mouth/Throat:     Pharynx: No oropharyngeal exudate.  Eyes:     General: No scleral icterus.    Conjunctiva/sclera: Conjunctivae normal.  Neck:     Musculoskeletal: Normal range of motion and neck supple.  Cardiovascular:     Rate and Rhythm: Normal rate and regular rhythm.  Pulmonary:     Effort: Pulmonary effort is normal. No respiratory distress.     Breath sounds: Normal breath sounds. No wheezing.  Abdominal:     General: Bowel sounds are normal.     Palpations: Abdomen is soft. There is no mass.     Tenderness: There is abdominal tenderness in the left upper quadrant. There is no right CVA tenderness, left CVA tenderness, guarding or rebound. Negative signs include Murphy's sign and McBurney's sign.  Musculoskeletal: Normal range of motion.  Skin:    General: Skin is warm and dry.  Neurological:     Mental Status: She is alert.     Comments: Speech is clear and goal oriented Moves extremities without ataxia      ED Treatments / Results  Labs (all labs ordered are listed, but only abnormal results are displayed) Labs Reviewed  COMPREHENSIVE METABOLIC PANEL - Abnormal; Notable for the following components:      Result Value   CO2 21 (*)    Glucose, Bld 163 (*)    Calcium 8.8 (*)    Albumin 3.4 (*)    All other components within normal limits  URINALYSIS, ROUTINE W REFLEX MICROSCOPIC - Abnormal; Notable for the following components:   Protein, ur 30 (*)    All other components within normal limits  LIPASE, BLOOD  CBC  I-STAT BETA HCG BLOOD, ED ([facility], [facility], [facility] ONLY)     Radiology Dg Abd Acute 2+v W 1v Chest  Result Date: 06/17/2018 CLINICAL DATA:  Left upper quadrant pain for 2 weeks EXAM: DG ABDOMEN ACUTE W/ 1V CHEST COMPARISON:  05/09/2017 FINDINGS: Cardiac shadows within normal limits. The lungs are well aerated bilaterally. No focal infiltrate or sizable effusion is seen. Scattered large and small bowel gas is noted.  Mild retained fecal material is seen without obstructive change.  No abnormal mass or abnormal calcifications are noted. Changes of prior cholecystectomy are seen. No acute bony abnormality is noted. IMPRESSION: Changes of mild constipation.  No other focal abnormality is seen. Electronically Signed   By: Inez Catalina M.D.   On: 06/17/2018 23:41    Procedures Procedures (including critical care time)  Medications Ordered in ED Medications  alum & mag hydroxide-simeth (MAALOX/MYLANTA) 200-200-20 MG/5ML suspension 30 mL (30 mLs Oral Given 06/17/18 2302)    And  lidocaine (XYLOCAINE) 2 % viscous mouth solution 15 mL (15 mLs Oral Given 06/17/18 2302)  pantoprazole (PROTONIX) EC tablet 40 mg (40 mg Oral Given 06/17/18 2302)     Initial Impression / Assessment and Plan / ED Course  I have reviewed the triage vital signs and the nursing notes.  Pertinent labs & imaging results that were available during my care of the patient were reviewed by me and considered in my medical decision making (see chart for details).  Clinical Course as of Jun 18 123  Sun Jun 17, 2018  2217 afebrile  Temp: 98 F (36.7 C) [HM]  2217 No tachycardia  Pulse Rate: 89 [HM]  2217 No hypotension  BP: 107/62 [HM]    Clinical Course User Index [HM] Siddalee Vanderheiden, Gwenlyn Perking        KYLIA GRAJALES was evaluated in Emergency Department on 06/18/2018 for the symptoms described in the history of present illness. She was evaluated in the context of the global COVID-19 pandemic, which necessitated consideration that the patient might be at risk for infection with the SARS-CoV-2 virus that causes COVID-19. Institutional protocols and algorithms that pertain to the evaluation of patients at risk for COVID-19 are in a state of rapid change based on information released by regulatory bodies including the CDC and federal and state organizations. These policies and algorithms were followed during the patient's care in the ED.    Patient presents with gradually worsening left upper quadrant abdominal pain for the last 3 weeks.  On exam abdomen is soft with minimal tenderness.  No rebound or guarding.  No flank pain.  UA without evidence of urinary tract infection.  Small amount of protein noted, suspect likely dehydration due to decreased oral intake however patient informed of this and request primary care follow-up.  Labs are otherwise reassuring.  History of cholecystectomy.  No elevation in lipase, AST/ALT or total bili.  No leukocytosis.  Patient is afebrile without tachycardia or hypotension.  No evidence of sepsis.  Cute abdominal series is without evidence of bowel gas pattern suggestive of bowel obstruction or free air to suggest perforation.  Patient symptoms are most consistent with peptic ulcer disease versus gastritis.  Likely to be acute coronary syndrome as she has no chest pain or shortness of breath, length of time symptoms have been present, no exertional symptoms.  Pain improved significantly after GI cocktail.  Patient will be discharged home with Carafate, Pepcid and Protonix.  She is to have close follow-up with GI for potential EGD.  Gust reasons to return immediately to the emergency department including shortness of breath, development of chest pain, worsening symptoms, vomiting, hematemesis or other concerns.  Patient states understanding, is in agreement with the plan and appears stable for discharge home.  Final Clinical Impressions(s) / ED Diagnoses   Final diagnoses:  Acute gastritis, presence of bleeding unspecified, unspecified gastritis type    ED Discharge Orders         Ordered    sucralfate (CARAFATE) 1 g  tablet  3 times daily with meals & bedtime     06/18/18 0051    famotidine (PEPCID) 20 MG tablet  2 times daily     06/18/18 0051    pantoprazole (PROTONIX) 20 MG tablet  Daily     06/18/18 0051           Lamoine Magallon, Gwenlyn Perking 06/18/18 0125    Davonna Belling, MD  06/18/18 2226

## 2018-06-18 MED ORDER — FAMOTIDINE 20 MG PO TABS: 20.0000 mg | ORAL_TABLET | Freq: Two times a day (BID) | ORAL | 0 refills | Status: DC

## 2018-06-18 MED ORDER — PANTOPRAZOLE SODIUM 20 MG PO TBEC: 20.0000 mg | DELAYED_RELEASE_TABLET | Freq: Every day | ORAL | 0 refills | Status: DC

## 2018-06-18 MED ORDER — SUCRALFATE 1 G PO TABS: 1.0000 g | ORAL_TABLET | Freq: Three times a day (TID) | ORAL | 0 refills | Status: DC

## 2018-06-18 NOTE — Discharge Instructions (Addendum)
1. Medications: Pepcid, Protonix, Carafate, usual home medications 2. Treatment: rest, drink plenty of fluids, advance diet slowly 3. Follow Up: Please followup with your primary doctor in 2 days, gastroenterology in appro 1 week for discussion of your diagnoses and further evaluation after today's visit; if you do not have a primary care doctor use the resource guide provided to find one; Please return to the ER for persistent vomiting, high fevers or worsening symptoms

## 2018-06-18 NOTE — ED Notes (Signed)
Patient verbalizes understanding of discharge instructions. Opportunity for questioning and answers were provided. Armband removed by staff, pt discharged from ED ambulatory.   

## 2018-08-23 ENCOUNTER — Ambulatory Visit: Payer: No Typology Code available for payment source | Admitting: Family Medicine

## 2018-08-29 ENCOUNTER — Ambulatory Visit: Payer: PRIVATE HEALTH INSURANCE | Attending: Family Medicine | Admitting: Family Medicine

## 2018-08-29 ENCOUNTER — Encounter: Payer: Self-pay | Admitting: Family Medicine

## 2018-08-29 ENCOUNTER — Other Ambulatory Visit: Payer: Self-pay

## 2018-08-29 VITALS — BP 146/82 | HR 93 | Temp 98.0°F | Ht 65.0 in | Wt 218.0 lb

## 2018-08-29 DIAGNOSIS — E1165 Type 2 diabetes mellitus with hyperglycemia: Secondary | ICD-10-CM

## 2018-08-29 DIAGNOSIS — F5101 Primary insomnia: Secondary | ICD-10-CM | POA: Diagnosis not present

## 2018-08-29 DIAGNOSIS — K2950 Unspecified chronic gastritis without bleeding: Secondary | ICD-10-CM | POA: Diagnosis not present

## 2018-08-29 DIAGNOSIS — R600 Localized edema: Secondary | ICD-10-CM | POA: Diagnosis not present

## 2018-08-29 DIAGNOSIS — I10 Essential (primary) hypertension: Secondary | ICD-10-CM

## 2018-08-29 DIAGNOSIS — E1142 Type 2 diabetes mellitus with diabetic polyneuropathy: Secondary | ICD-10-CM

## 2018-08-29 DIAGNOSIS — Z794 Long term (current) use of insulin: Secondary | ICD-10-CM | POA: Diagnosis not present

## 2018-08-29 DIAGNOSIS — F331 Major depressive disorder, recurrent, moderate: Secondary | ICD-10-CM

## 2018-08-29 LAB — POCT GLYCOSYLATED HEMOGLOBIN (HGB A1C): HbA1c, POC (controlled diabetic range): 9.7 % — AB (ref 0.0–7.0)

## 2018-08-29 LAB — GLUCOSE, POCT (MANUAL RESULT ENTRY): POC Glucose: 357 mg/dl — AB (ref 70–99)

## 2018-08-29 MED ORDER — PANTOPRAZOLE SODIUM 20 MG PO TBEC: 20.0000 mg | DELAYED_RELEASE_TABLET | Freq: Every day | ORAL | 0 refills | Status: DC

## 2018-08-29 MED ORDER — GABAPENTIN 300 MG PO CAPS: 300.0000 mg | ORAL_CAPSULE | Freq: Three times a day (TID) | ORAL | 3 refills | Status: DC

## 2018-08-29 MED ORDER — SIMVASTATIN 40 MG PO TABS: 40.0000 mg | ORAL_TABLET | Freq: Every day | ORAL | 3 refills | Status: DC

## 2018-08-29 MED ORDER — METOCLOPRAMIDE HCL 10 MG PO TABS: 10.0000 mg | ORAL_TABLET | Freq: Three times a day (TID) | ORAL | 0 refills | Status: DC | PRN

## 2018-08-29 MED ORDER — TRAZODONE HCL 100 MG PO TABS: 100.0000 mg | ORAL_TABLET | Freq: Every evening | ORAL | 3 refills | Status: DC | PRN

## 2018-08-29 MED ORDER — METOPROLOL TARTRATE 25 MG PO TABS: 25.0000 mg | ORAL_TABLET | Freq: Two times a day (BID) | ORAL | 3 refills | Status: DC

## 2018-08-29 MED ORDER — FLUOXETINE HCL 40 MG PO CAPS: 40.0000 mg | ORAL_CAPSULE | Freq: Every day | ORAL | 1 refills | Status: DC

## 2018-08-29 MED ORDER — LANTUS SOLOSTAR 100 UNIT/ML ~~LOC~~ SOPN: 52.0000 [IU] | PEN_INJECTOR | Freq: Every day | SUBCUTANEOUS | 5 refills | Status: DC

## 2018-08-29 MED ORDER — METFORMIN HCL 1000 MG PO TABS: 1000.0000 mg | ORAL_TABLET | Freq: Two times a day (BID) | ORAL | 1 refills | Status: DC

## 2018-08-29 MED ORDER — INSULIN ASPART 100 UNIT/ML ~~LOC~~ SOLN: 0.0000 [IU] | Freq: Three times a day (TID) | SUBCUTANEOUS | 3 refills | Status: DC

## 2018-08-29 MED ORDER — LISINOPRIL-HYDROCHLOROTHIAZIDE 20-12.5 MG PO TABS: 2.0000 | ORAL_TABLET | Freq: Every day | ORAL | 1 refills | Status: DC

## 2018-08-29 MED ORDER — FUROSEMIDE 20 MG PO TABS: 20.0000 mg | ORAL_TABLET | Freq: Every day | ORAL | 0 refills | Status: DC

## 2018-08-29 MED FILL — FLUoxetine HCL 40 MG CAPS: 40 | 30 days supply | Qty: 30 | Fill #0

## 2018-08-29 MED FILL — FUROSEMIDE 20 MG TABS: 20 | 30 days supply | Qty: 30 | Fill #0

## 2018-08-29 MED FILL — PANTOPRAZOLE SOD DR 20 MG T: 20 | 30 days supply | Qty: 30 | Fill #0

## 2018-08-29 MED FILL — metFORMIN HCL 1000 MG TABS: 1000 | 30 days supply | Qty: 60 | Fill #0

## 2018-08-29 MED FILL — !NOVOLOG FLEXPEN SYRINGE 1: 100/ML | 33 days supply | Qty: 12 | Fill #0

## 2018-08-29 MED FILL — SIMVASTATIN 40 MG TABLET: 40 | 30 days supply | Qty: 30 | Fill #0

## 2018-08-29 MED FILL — LISINOPRIL-HCTZ 20-12.5 MG: 20-12.5 | 30 days supply | Qty: 60 | Fill #0

## 2018-08-29 MED FILL — GABAPENTIN 300 MG CAPSULE: 300 | 30 days supply | Qty: 90 | Fill #0

## 2018-08-29 MED FILL — traZODone HCL 100 MG TABS: 100 | 30 days supply | Qty: 30 | Fill #0

## 2018-08-29 MED FILL — METOCLOPRAMIDE 10 MG TABLET: 10 | 10 days supply | Qty: 30 | Fill #0

## 2018-08-29 MED FILL — METOPROLOL TARTRATE 25 MG T: 25 | 30 days supply | Qty: 60 | Fill #0

## 2018-08-29 MED FILL — $LANTUS SOLOSTAR 100 UNITS/: 100 | 28 days supply | Qty: 15 | Fill #0

## 2018-08-29 NOTE — Progress Notes (Signed)
Patient is having very bad abdominal pain. Patient is having swelling in her lower legs.  Patient is fasting and has taken morning medications.

## 2018-08-29 NOTE — Progress Notes (Signed)
Subjective:  Patient ID: Mariah Miller, female    DOB: 1975/03/12  Age: 43 y.o. MRN: [id number]  CC: Diabetes   HPI Mariah Miller is a 43 year old female with a history of type 2 diabetes mellitus (A1c 12.8), hypertension, depression who presents today for follow-up visit. Her A1c is 9.7 which is down from 12.8 previously and she endorses compliance with her medications but she does not exercise.  Denies hypoglycemia, visual concerns or numbness in extremities.  She complains of left upper quadrant epigastric pain which has been present for the last 2 months and has been associated with nausea and vomiting, early satiety, abdominal bloating. She was seen at the ED on 06/17/2018 and x-ray revealed constipation.  She was placed on Protonix and ranitidine for 1 month.  2 days ago she had to leave work because of severe nausea and this is on limited to certain times of the day.  She also has associated left upper quadrant burning.  Denies blood in stools, hematemesis. She complains of pedal edema for the last 2 weeks and have occurred to the point where she has been unable to wear her shoes.  Denies shortness of breath, orthopnea.  Depression has been uncontrolled as her mom has been sick and she has had other life stressors.  She has also been out of Prozac.  Denies suicidal ideations or intent.  Past Medical History:  Diagnosis Date  . Diabetes mellitus without complication ([place])   . Hypertension   . Migraine   . Seizures ([place])    self reported    Past Surgical History:  Procedure Laterality Date  . CHOLECYSTECTOMY    . CYST EXCISION    . SHOULDER OPEN ROTATOR CUFF REPAIR Left   . TONSILLECTOMY    . TUBAL LIGATION      Family History  Problem Relation Age of Onset  . Hypertension Mother   . Thyroid disease Mother   . Irregular heart beat Mother   . Bipolar disorder Sister     Allergies  Allergen Reactions  . Tomato Itching and Rash    Outpatient Medications  Prior to Visit  Medication Sig Dispense Refill  . albuterol (PROVENTIL HFA;VENTOLIN HFA) 108 (90 Base) MCG/ACT inhaler Inhale 1-2 puffs into the lungs every 6 (six) hours as needed for wheezing or shortness of breath. 1 Inhaler 0  . aspirin EC 81 MG tablet Take 1 tablet (81 mg total) by mouth daily. 30 tablet 5  . Blood Glucose Monitoring Suppl (TRUE METRIX METER) w/Device KIT USE AS DIRECTED 1 kit 0  . cetirizine (ZYRTEC) 10 MG tablet Take 1 tablet (10 mg total) by mouth daily. (Patient taking differently: Take 10 mg by mouth daily as needed for allergies. ) 30 tablet 1  . famotidine (PEPCID) 20 MG tablet Take 1 tablet (20 mg total) by mouth 2 (two) times daily. 20 tablet 0  . ferrous sulfate 325 (65 FE) MG tablet Take 1 tablet (325 mg total) by mouth 2 (two) times daily with a meal. 180 tablet 3  . glucose blood (TRUE METRIX BLOOD GLUCOSE TEST) test strip Use as instructed 100 each 12  . liraglutide (VICTOZA) 18 MG/3ML SOPN Inject subcutaneously daily 0.6 mg for 1 week then 1.2 mg for 1 week then 1.8 mg thereafter (Patient taking differently: Inject 1.8 mg into the skin every morning. ) 30 mL 3  . TRUEplus Lancets 28G MISC USE AS INSTRUCTED 100 each 5  . gabapentin (NEURONTIN) 300 MG capsule Take 1  capsule (300 mg total) by mouth 3 (three) times daily. 90 capsule 3  . insulin aspart (NOVOLOG) 100 UNIT/ML injection Inject 0-12 Units into the skin 3 (three) times daily. As perSliding-scale 30 mL 3  . Insulin Glargine (LANTUS SOLOSTAR) 100 UNIT/ML Solostar Pen Inject 42 Units into the skin 2 (two) times daily. (Patient taking differently: Inject 42 Units into the skin at bedtime. ) 30 mL 5  . lisinopril-hydrochlorothiazide (PRINZIDE,ZESTORETIC) 20-12.5 MG tablet Take 2 tablets by mouth daily. 180 tablet 1  . metFORMIN (GLUCOPHAGE) 1000 MG tablet Take 1 tablet (1,000 mg total) by mouth 2 (two) times daily with a meal. 180 tablet 1  . pantoprazole (PROTONIX) 20 MG tablet Take 1 tablet (20 mg total) by  mouth daily. 30 tablet 0  . simvastatin (ZOCOR) 40 MG tablet Take 1 tablet (40 mg total) by mouth at bedtime. 30 tablet 3  . traZODone (DESYREL) 100 MG tablet Take 1 tablet (100 mg total) by mouth at bedtime as needed for sleep. 30 tablet 3  . benzonatate (TESSALON) 100 MG capsule Take 1 capsule (100 mg total) by mouth every 8 (eight) hours. (Patient not taking: Reported on 06/17/2018) 21 capsule 0  . sucralfate (CARAFATE) 1 g tablet Take 1 tablet (1 g total) by mouth 4 (four) times daily -  with meals and at bedtime. (Patient not taking: Reported on 08/29/2018) 60 tablet 0  . triamcinolone cream (KENALOG) 0.1 % Apply 1 application topically 2 (two) times daily. (Patient not taking: Reported on 11/27/2017) 45 g 1  . FLUoxetine (PROZAC) 40 MG capsule Take 1 capsule (40 mg total) by mouth daily. (Patient not taking: Reported on 08/29/2018) 90 capsule 1   No facility-administered medications prior to visit.      ROS Review of Systems  Constitutional: Negative for activity change, appetite change and fatigue.  HENT: Negative for congestion, sinus pressure and sore throat.   Eyes: Negative for visual disturbance.  Respiratory: Negative for cough, chest tightness, shortness of breath and wheezing.   Cardiovascular: Positive for leg swelling. Negative for chest pain and palpitations.  Gastrointestinal: Positive for abdominal pain. Negative for abdominal distention and constipation.  Endocrine: Negative for polydipsia.  Genitourinary: Negative for dysuria and frequency.  Musculoskeletal: Negative for arthralgias and back pain.  Skin: Negative for rash.  Neurological: Negative for tremors, light-headedness and numbness.  Hematological: Does not bruise/bleed easily.  Psychiatric/Behavioral: Negative for agitation and behavioral problems.    Objective:  BP (!) 146/82   Pulse 93   Temp 98 F (36.7 C) (Oral)   Ht '5\' 5"'$  (1.651 m)   Wt 218 lb (98.9 kg)   SpO2 100%   BMI 36.28 kg/m   BP/Weight  08/29/2018 06/17/2018 11/20/567  Systolic BP 794 801 655  Diastolic BP 82 79 75  Wt. (Lbs) 218 200 210  BMI 36.28 33.28 34.95      Physical Exam Constitutional:      Appearance: She is well-developed.  Cardiovascular:     Rate and Rhythm: Normal rate.     Heart sounds: Normal heart sounds. No murmur.  Pulmonary:     Effort: Pulmonary effort is normal.     Breath sounds: Normal breath sounds. No wheezing or rales.  Chest:     Chest wall: No tenderness.  Abdominal:     General: Bowel sounds are normal. There is no distension.     Palpations: Abdomen is soft. There is no mass.     Tenderness: There is abdominal tenderness (LUQ).  Musculoskeletal: Normal range of motion.     Right lower leg: No edema.     Left lower leg: No edema.  Neurological:     Mental Status: She is alert and oriented to person, place, and time.     CMP Latest Ref Rng & Units 06/17/2018 05/16/2018 10/05/2017  Glucose 70 - 99 mg/dL 163(H) 357(H) 488(H)  BUN 6 - 20 mg/dL '7 12 7  '$ Creatinine 0.44 - 1.00 mg/dL 0.90 1.04(H) 0.80  Sodium 135 - 145 mmol/L 135 135 133(L)  Potassium 3.5 - 5.1 mmol/L 3.8 4.6 3.9  Chloride 98 - 111 mmol/L 106 98 101  CO2 22 - 32 mmol/L 21(L) 22 24  Calcium 8.9 - 10.3 mg/dL 8.8(L) 9.2 8.9  Total Protein 6.5 - 8.1 g/dL 6.7 7.2 6.9  Total Bilirubin 0.3 - 1.2 mg/dL 0.5 0.2 0.8  Alkaline Phos 38 - 126 U/L 57 81 67  AST 15 - 41 U/L '16 16 15  '$ ALT 0 - 44 U/L '13 13 14    '$ Lipid Panel     Component Value Date/Time   CHOL 212 (H) 05/16/2018 0950   TRIG 172 (H) 05/16/2018 0950   HDL 45 05/16/2018 0950   CHOLHDL 4.7 (H) 05/16/2018 0950   CHOLHDL 4.7 05/25/2016 1543   VLDL 37 (H) 05/25/2016 1543   LDLCALC 133 (H) 05/16/2018 0950    CBC    Component Value Date/Time   WBC 6.3 06/17/2018 2202   RBC 4.20 06/17/2018 2202   HGB 12.1 06/17/2018 2202   HCT 38.2 06/17/2018 2202   PLT 334 06/17/2018 2202   MCV 91.0 06/17/2018 2202   MCH 28.8 06/17/2018 2202   MCHC 31.7 06/17/2018 2202    RDW 12.9 06/17/2018 2202   LYMPHSABS 2.4 10/05/2017 1628   MONOABS 0.5 10/05/2017 1628   EOSABS 0.1 10/05/2017 1628   BASOSABS 0.0 10/05/2017 1628    Lab Results  Component Value Date   HGBA1C 9.7 (A) 08/29/2018    Assessment & Plan:   1. Type 2 diabetes mellitus with diabetic polyneuropathy, with long-term current use of insulin (HCC) Uncontrolled with A1c of 9.7 which is down from 12.8 Increased Lantus dose Counseled on Diabetic diet, my plate method, 408 minutes of moderate intensity exercise/week Keep blood sugar logs with fasting goals of 80-120 mg/dl, random of less than 180 and in the event of sugars less than 60 mg/dl or greater than 400 mg/dl please notify the clinic ASAP. It is recommended that you undergo annual eye exams and annual foot exams. Pneumonia vaccine is recommended. - POCT glucose (manual entry) - POCT glycosylated hemoglobin (Hb A1C) - insulin aspart (NOVOLOG) 100 UNIT/ML injection; Inject 0-12 Units into the skin 3 (three) times daily. As perSliding-scale  Dispense: 30 mL; Refill: 3 - metFORMIN (GLUCOPHAGE) 1000 MG tablet; Take 1 tablet (1,000 mg total) by mouth 2 (two) times daily with a meal.  Dispense: 180 tablet; Refill: 1 - gabapentin (NEURONTIN) 300 MG capsule; Take 1 capsule (300 mg total) by mouth 3 (three) times daily.  Dispense: 90 capsule; Refill: 3 - simvastatin (ZOCOR) 40 MG tablet; Take 1 tablet (40 mg total) by mouth at bedtime.  Dispense: 30 tablet; Refill: 3  2. Uncontrolled type 2 diabetes mellitus with hyperglycemia, with long-term current use of insulin ([place]) See #1 above - Ambulatory referral to Ophthalmology - Insulin Glargine (LANTUS SOLOSTAR) 100 UNIT/ML Solostar Pen; Inject 52 Units into the skin at bedtime.  Dispense: 30 mL; Refill: 5  3. Primary insomnia Stable -  traZODone (DESYREL) 100 MG tablet; Take 1 tablet (100 mg total) by mouth at bedtime as needed for sleep.  Dispense: 30 tablet; Refill: 3  4. Essential hypertension  Uncontrolled Would love to place on Amlodipine however due to pedal edema will hold off. - lisinopril-hydrochlorothiazide (ZESTORETIC) 20-12.5 MG tablet; Take 2 tablets by mouth daily.  Dispense: 180 tablet; Refill: 1 - metoprolol tartrate (LOPRESSOR) 25 MG tablet; Take 1 tablet (25 mg total) by mouth 2 (two) times daily.  Dispense: 60 tablet; Refill: 3  5. Moderate episode of recurrent major depressive disorder ([place]) Uncontrolled due to ongoing stressors She will benefit from therapy - Ambulatory referral to Social Work - FLUoxetine (PROZAC) 40 MG capsule; Take 1 capsule (40 mg total) by mouth daily.  Dispense: 90 capsule; Refill: 1  6. Pedal edema No edema noted on exam Elevate feet, reduce sodium intake We will prescribe Lasix to be used as needed for pedal edema - furosemide (LASIX) 20 MG tablet; Take 1 tablet (20 mg total) by mouth daily. As needed for pedal edema  Dispense: 30 tablet; Refill: 0  7. Other chronic gastritis without hemorrhage If negative breath test will refer for upper endoscopy - H. pylori breath test - pantoprazole (PROTONIX) 20 MG tablet; Take 1 tablet (20 mg total) by mouth daily.  Dispense: 30 tablet; Refill: 0 - metoCLOPramide (REGLAN) 10 MG tablet; Take 1 tablet (10 mg total) by mouth every 8 (eight) hours as needed for nausea.  Dispense: 30 tablet; Refill: 0   Meds ordered this encounter  Medications  . Insulin Glargine (LANTUS SOLOSTAR) 100 UNIT/ML Solostar Pen    Sig: Inject 52 Units into the skin at bedtime.    Dispense:  30 mL    Refill:  5    Discontinue previous dose  . insulin aspart (NOVOLOG) 100 UNIT/ML injection    Sig: Inject 0-12 Units into the skin 3 (three) times daily. As perSliding-scale    Dispense:  30 mL    Refill:  3  . metFORMIN (GLUCOPHAGE) 1000 MG tablet    Sig: Take 1 tablet (1,000 mg total) by mouth 2 (two) times daily with a meal.    Dispense:  180 tablet    Refill:  1  . traZODone (DESYREL) 100 MG tablet    Sig: Take 1  tablet (100 mg total) by mouth at bedtime as needed for sleep.    Dispense:  30 tablet    Refill:  3    Discontinue previous dose  . lisinopril-hydrochlorothiazide (ZESTORETIC) 20-12.5 MG tablet    Sig: Take 2 tablets by mouth daily.    Dispense:  180 tablet    Refill:  1  . pantoprazole (PROTONIX) 20 MG tablet    Sig: Take 1 tablet (20 mg total) by mouth daily.    Dispense:  30 tablet    Refill:  0  . gabapentin (NEURONTIN) 300 MG capsule    Sig: Take 1 capsule (300 mg total) by mouth 3 (three) times daily.    Dispense:  90 capsule    Refill:  3  . FLUoxetine (PROZAC) 40 MG capsule    Sig: Take 1 capsule (40 mg total) by mouth daily.    Dispense:  90 capsule    Refill:  1  . simvastatin (ZOCOR) 40 MG tablet    Sig: Take 1 tablet (40 mg total) by mouth at bedtime.    Dispense:  30 tablet    Refill:  3    Dose change  .  furosemide (LASIX) 20 MG tablet    Sig: Take 1 tablet (20 mg total) by mouth daily. As needed for pedal edema    Dispense:  30 tablet    Refill:  0  . metoCLOPramide (REGLAN) 10 MG tablet    Sig: Take 1 tablet (10 mg total) by mouth every 8 (eight) hours as needed for nausea.    Dispense:  30 tablet    Refill:  0  . metoprolol tartrate (LOPRESSOR) 25 MG tablet    Sig: Take 1 tablet (25 mg total) by mouth 2 (two) times daily.    Dispense:  60 tablet    Refill:  3    Follow-up: Return in about 3 months (around 11/29/2018) for medical conditions.       Charlott Rakes, MD, FAAFP. [facility] [place], [place]   08/29/2018, 12:28 PM

## 2018-08-30 LAB — H. PYLORI BREATH TEST: H pylori Breath Test: NEGATIVE

## 2018-08-31 ENCOUNTER — Other Ambulatory Visit: Payer: Self-pay | Admitting: Family Medicine

## 2018-08-31 DIAGNOSIS — K2960 Other gastritis without bleeding: Secondary | ICD-10-CM

## 2018-09-05 MED FILL — VICTOZA 18 MG/3 ML INJECT P: 18 | 30 days supply | Qty: 9 | Fill #1

## 2018-09-14 MED FILL — VICTOZA 18 MG/3 ML INJECT P: 18 | 30 days supply | Qty: 9 | Fill #1

## 2018-09-21 ENCOUNTER — Telehealth: Payer: Self-pay | Admitting: Licensed Clinical Social Worker

## 2018-09-21 NOTE — Telephone Encounter (Signed)
Call placed to patient regarding consult from PCP. LCSW left a message requesting a return call.

## 2018-10-16 ENCOUNTER — Ambulatory Visit: Payer: PRIVATE HEALTH INSURANCE | Admitting: Gastroenterology

## 2018-11-22 ENCOUNTER — Encounter: Payer: Self-pay | Admitting: Gastroenterology

## 2018-11-22 ENCOUNTER — Ambulatory Visit (INDEPENDENT_AMBULATORY_CARE_PROVIDER_SITE_OTHER): Payer: PRIVATE HEALTH INSURANCE | Admitting: Gastroenterology

## 2018-11-22 VITALS — BP 132/64 | HR 92 | Temp 97.2°F | Ht 65.0 in | Wt 202.0 lb

## 2018-11-22 DIAGNOSIS — R112 Nausea with vomiting, unspecified: Secondary | ICD-10-CM | POA: Diagnosis not present

## 2018-11-22 DIAGNOSIS — R1012 Left upper quadrant pain: Secondary | ICD-10-CM | POA: Diagnosis not present

## 2018-11-22 NOTE — Patient Instructions (Addendum)
I have recommended an upper endoscopy for further evaluation.  I have not changed your medications today.

## 2018-11-22 NOTE — Progress Notes (Signed)
Referring Provider: Charlott Rakes, MD Primary Care Physician:  Charlott Rakes, MD  Reason for Consultation:  LUQ pain   IMPRESSION:  LUQ pain with associated bloating, eructation, early satiety, and nausea x2 months Diabetes mellitus with a hemoglobin A1c of 9.7 Prior cholecystectomy for acute cholecystitis No known first or second-degree family members with colon cancer or polyps  Suspected gastroparesis.  But the differential also includes reflux esophagitis, gastritis, H. pylori, gastric outlet obstruction, and even hepatobiliary etiologies.  She has not responded to pantoprazole, famotidine, and Carafate.  I recommended an upper endoscopy for further evaluation.  We will proceed with gastric emptying scan and an abdominal ultrasound if the EGD is nondiagnostic.  PLAN: Continue pantoprazole, famotidine, and carafate EGD  Gastric emptying scan +/- abdominal ultrasound if the EGD is non-diagnostic  Please see the "Patient Instructions" section for addition details about the plan.  HPI: Mariah Miller is a 43 y.o. female referred by Dr. Margarita Rana for further evaluation of abdominal pain.  The history is obtained through the patient and review of her electronic health record.  She has type 2 diabetes mellitus (A1c 12.8->9.7), hypertension, depression. Cholecystectomy 12/2014 for acute cholecystitis.   Intermittent, throbbing LUQ x 2 months. Associated increase in postprandial bloating, eructation, early satiety, flatus and nausea.  Rare vomiting.  She has a poor appetite.  No change with eating, defecation, movement.  Frequently lies on her left side to sleep.  No diarrhea or constipation. No blood in the stools.  Occasional mucous in the stools. She feels like the food sits in her stomach for a long time. No dysphagia, odynophagia, heartburn.  No change in bowel habits, constipation, diarrhea.  Pain improves by burping after drinking ginger ale. Weight fluctuates. No other associated  symptoms. No identified exacerbating or relieving features.   She was seen at the ED on 06/17/2018 and x-ray revealed constipation.     No improvement with pantoprazole, famotidine, and carafate BID. Has not tried any other treatments.  No trial of OTC medications.    Great great uncle with colon cancer in his 79s. No known family history of colon cancer or polyps. No family history of uterine/endometrial cancer, pancreatic cancer or gastric/stomach cancer.   Past Medical History:  Diagnosis Date  . Diabetes mellitus without complication ([place])   . Hypertension   . Migraine   . Seizures ([place])    self reported    Past Surgical History:  Procedure Laterality Date  . CHOLECYSTECTOMY    . CYST EXCISION    . SHOULDER OPEN ROTATOR CUFF REPAIR Left   . TONSILLECTOMY    . TUBAL LIGATION      Current Outpatient Medications  Medication Sig Dispense Refill  . albuterol (PROVENTIL HFA;VENTOLIN HFA) 108 (90 Base) MCG/ACT inhaler Inhale 1-2 puffs into the lungs every 6 (six) hours as needed for wheezing or shortness of breath. 1 Inhaler 0  . aspirin EC 81 MG tablet Take 1 tablet (81 mg total) by mouth daily. 30 tablet 5  . benzonatate (TESSALON) 100 MG capsule Take 1 capsule (100 mg total) by mouth every 8 (eight) hours. 21 capsule 0  . Blood Glucose Monitoring Suppl (TRUE METRIX METER) w/Device KIT USE AS DIRECTED 1 kit 0  . cetirizine (ZYRTEC) 10 MG tablet Take 1 tablet (10 mg total) by mouth daily. (Patient taking differently: Take 10 mg by mouth daily as needed for allergies. ) 30 tablet 1  . famotidine (PEPCID) 20 MG tablet Take 1 tablet (20 mg total)  by mouth 2 (two) times daily. 20 tablet 0  . ferrous sulfate 325 (65 FE) MG tablet Take 1 tablet (325 mg total) by mouth 2 (two) times daily with a meal. 180 tablet 3  . FLUoxetine (PROZAC) 40 MG capsule Take 1 capsule (40 mg total) by mouth daily. 90 capsule 1  . furosemide (LASIX) 20 MG tablet Take 1 tablet (20 mg total) by mouth daily. As  needed for pedal edema 30 tablet 0  . gabapentin (NEURONTIN) 300 MG capsule Take 1 capsule (300 mg total) by mouth 3 (three) times daily. 90 capsule 3  . glucose blood (TRUE METRIX BLOOD GLUCOSE TEST) test strip Use as instructed 100 each 12  . insulin aspart (NOVOLOG) 100 UNIT/ML injection Inject 0-12 Units into the skin 3 (three) times daily. As perSliding-scale 30 mL 3  . Insulin Glargine (LANTUS SOLOSTAR) 100 UNIT/ML Solostar Pen Inject 52 Units into the skin at bedtime. 30 mL 5  . liraglutide (VICTOZA) 18 MG/3ML SOPN Inject subcutaneously daily 0.6 mg for 1 week then 1.2 mg for 1 week then 1.8 mg thereafter (Patient taking differently: Inject 1.8 mg into the skin every morning. ) 30 mL 3  . lisinopril-hydrochlorothiazide (ZESTORETIC) 20-12.5 MG tablet Take 2 tablets by mouth daily. 180 tablet 1  . metFORMIN (GLUCOPHAGE) 1000 MG tablet Take 1 tablet (1,000 mg total) by mouth 2 (two) times daily with a meal. 180 tablet 1  . metoCLOPramide (REGLAN) 10 MG tablet Take 1 tablet (10 mg total) by mouth every 8 (eight) hours as needed for nausea. 30 tablet 0  . metoprolol tartrate (LOPRESSOR) 25 MG tablet Take 1 tablet (25 mg total) by mouth 2 (two) times daily. 60 tablet 3  . pantoprazole (PROTONIX) 20 MG tablet Take 1 tablet (20 mg total) by mouth daily. 30 tablet 0  . simvastatin (ZOCOR) 40 MG tablet Take 1 tablet (40 mg total) by mouth at bedtime. 30 tablet 3  . sucralfate (CARAFATE) 1 g tablet Take 1 tablet (1 g total) by mouth 4 (four) times daily -  with meals and at bedtime. 60 tablet 0  . traZODone (DESYREL) 100 MG tablet Take 1 tablet (100 mg total) by mouth at bedtime as needed for sleep. 30 tablet 3  . triamcinolone cream (KENALOG) 0.1 % Apply 1 application topically 2 (two) times daily. 45 g 1  . TRUEplus Lancets 28G MISC USE AS INSTRUCTED 100 each 5   No current facility-administered medications for this visit.     Allergies as of 11/22/2018 - Review Complete 11/22/2018  Allergen  Reaction Noted  . Tomato Itching and Rash 08/29/2015    Family History  Problem Relation Age of Onset  . Hypertension Mother   . Thyroid disease Mother   . Irregular heart beat Mother   . Bipolar disorder Sister     Social History   Socioeconomic History  . Marital status: Divorced    Spouse name: Not on file  . Number of children: Not on file  . Years of education: Not on file  . Highest education level: Not on file  Occupational History    Comment: Assembler   Social Needs  . Financial resource strain: Not on file  . Food insecurity    Worry: Not on file    Inability: Not on file  . Transportation needs    Medical: Not on file    Non-medical: Not on file  Tobacco Use  . Smoking status: Former Research scientist (life sciences)  . Smokeless tobacco: Never Used  .  Tobacco comment: Smoked for 4 years, quit age 89  Substance and Sexual Activity  . Alcohol use: Yes    Alcohol/week: 0.0 standard drinks    Comment: Holidays only  . Drug use: No  . Sexual activity: Yes    Birth control/protection: None  Lifestyle  . Physical activity    Days per week: Not on file    Minutes per session: Not on file  . Stress: Not on file  Relationships  . Social Herbalist on phone: Not on file    Gets together: Not on file    Attends religious service: Not on file    Active member of club or organization: Not on file    Attends meetings of clubs or organizations: Not on file    Relationship status: Not on file  . Intimate partner violence    Fear of current or ex partner: Not on file    Emotionally abused: Not on file    Physically abused: Not on file    Forced sexual activity: Not on file  Other Topics Concern  . Not on file  Social History Narrative  . Not on file    Review of Systems: 12 system ROS is negative except as noted above.   Physical Exam: General:   Alert,  well-nourished, pleasant and cooperative in NAD Head:  Normocephalic and atraumatic. Eyes:  Sclera clear, no  icterus.   Conjunctiva pink. Ears:  Normal auditory acuity. Nose:  No deformity, discharge,  or lesions. Mouth:  No deformity or lesions.   Neck:  Supple; no masses or thyromegaly. Lungs:  Clear throughout to auscultation.   No wheezes. Heart:  Regular rate and rhythm; no murmurs. Abdomen:  Soft,nontender, nondistended, normal bowel sounds, no rebound or guarding. No hepatosplenomegaly.   Rectal:  Deferred  Msk:  Symmetrical. No boney deformities LAD: No inguinal or umbilical LAD Extremities:  No clubbing or edema. Neurologic:  Alert and  oriented x4;  grossly nonfocal Skin:  Intact without significant lesions or rashes. Psych:  Alert and cooperative. Normal mood and affect.    Lavelle Akel L. Tarri Glenn, MD, MPH 11/22/2018, 9:55 AM

## 2018-12-03 ENCOUNTER — Encounter: Payer: Self-pay | Admitting: Family Medicine

## 2018-12-03 ENCOUNTER — Ambulatory Visit: Payer: PRIVATE HEALTH INSURANCE | Attending: Family Medicine | Admitting: Family Medicine

## 2018-12-03 ENCOUNTER — Other Ambulatory Visit: Payer: Self-pay

## 2018-12-03 VITALS — BP 156/89 | HR 85 | Temp 98.3°F | Ht 65.0 in | Wt 208.6 lb

## 2018-12-03 DIAGNOSIS — E1165 Type 2 diabetes mellitus with hyperglycemia: Secondary | ICD-10-CM

## 2018-12-03 DIAGNOSIS — Z794 Long term (current) use of insulin: Secondary | ICD-10-CM | POA: Diagnosis not present

## 2018-12-03 DIAGNOSIS — K2950 Unspecified chronic gastritis without bleeding: Secondary | ICD-10-CM

## 2018-12-03 DIAGNOSIS — E1142 Type 2 diabetes mellitus with diabetic polyneuropathy: Secondary | ICD-10-CM | POA: Diagnosis not present

## 2018-12-03 DIAGNOSIS — F331 Major depressive disorder, recurrent, moderate: Secondary | ICD-10-CM

## 2018-12-03 DIAGNOSIS — F5101 Primary insomnia: Secondary | ICD-10-CM

## 2018-12-03 DIAGNOSIS — I10 Essential (primary) hypertension: Secondary | ICD-10-CM

## 2018-12-03 LAB — POCT GLYCOSYLATED HEMOGLOBIN (HGB A1C): HbA1c, POC (controlled diabetic range): 9.6 % — AB (ref 0.0–7.0)

## 2018-12-03 LAB — GLUCOSE, POCT (MANUAL RESULT ENTRY): POC Glucose: 239 mg/dl — AB (ref 70–99)

## 2018-12-03 MED ORDER — FLUOXETINE HCL 40 MG PO CAPS: 40.0000 mg | ORAL_CAPSULE | Freq: Every day | ORAL | 1 refills | Status: DC

## 2018-12-03 MED ORDER — TRAZODONE HCL 100 MG PO TABS: 100.0000 mg | ORAL_TABLET | Freq: Every evening | ORAL | 3 refills | Status: DC | PRN

## 2018-12-03 MED ORDER — METOPROLOL TARTRATE 25 MG PO TABS: 25.0000 mg | ORAL_TABLET | Freq: Two times a day (BID) | ORAL | 3 refills | Status: DC

## 2018-12-03 MED ORDER — METFORMIN HCL 1000 MG PO TABS: 1000.0000 mg | ORAL_TABLET | Freq: Two times a day (BID) | ORAL | 1 refills | Status: DC

## 2018-12-03 MED ORDER — SIMVASTATIN 40 MG PO TABS: 40.0000 mg | ORAL_TABLET | Freq: Every day | ORAL | 3 refills | Status: DC

## 2018-12-03 MED ORDER — GABAPENTIN 300 MG PO CAPS: 300.0000 mg | ORAL_CAPSULE | Freq: Three times a day (TID) | ORAL | 3 refills | Status: DC

## 2018-12-03 MED ORDER — LISINOPRIL-HYDROCHLOROTHIAZIDE 20-12.5 MG PO TABS: 2.0000 | ORAL_TABLET | Freq: Every day | ORAL | 1 refills | Status: DC

## 2018-12-03 MED ORDER — INSULIN ASPART 100 UNIT/ML ~~LOC~~ SOLN: 0.0000 [IU] | Freq: Three times a day (TID) | SUBCUTANEOUS | 3 refills | Status: DC

## 2018-12-03 MED ORDER — LANTUS SOLOSTAR 100 UNIT/ML ~~LOC~~ SOPN: 26.0000 [IU] | PEN_INJECTOR | Freq: Two times a day (BID) | SUBCUTANEOUS | 5 refills | Status: DC

## 2018-12-03 MED ORDER — PANTOPRAZOLE SODIUM 20 MG PO TBEC: 20.0000 mg | DELAYED_RELEASE_TABLET | Freq: Every day | ORAL | 3 refills | Status: DC

## 2018-12-03 MED ORDER — VICTOZA 18 MG/3ML ~~LOC~~ SOPN: 1.8000 mg | PEN_INJECTOR | SUBCUTANEOUS | 5 refills | Status: DC

## 2018-12-03 MED FILL — METOPROLOL TARTRATE 25 MG T: 25 | 30 days supply | Qty: 60 | Fill #0

## 2018-12-03 MED FILL — GABAPENTIN 300 MG CAPSULE: 300 | 30 days supply | Qty: 90 | Fill #0

## 2018-12-03 MED FILL — PANTOPRAZOLE SOD DR 20 MG T: 20 | 30 days supply | Qty: 30 | Fill #0

## 2018-12-03 MED FILL — !NOVOLOG 100UNITS/ML VIAL: 100/ML | 27 days supply | Qty: 10 | Fill #0

## 2018-12-03 MED FILL — SIMVASTATIN 40 MG TABLET: 40 | 30 days supply | Qty: 30 | Fill #0

## 2018-12-03 MED FILL — !VICTOZA 18MG/3ML INJECT: 18 | 30 days supply | Qty: 9 | Fill #0

## 2018-12-03 MED FILL — metFORMIN HCL 1000 MG TABS: 1000 | 30 days supply | Qty: 60 | Fill #0

## 2018-12-03 MED FILL — traZODone HCL 100 MG TABS: 100 | 30 days supply | Qty: 30 | Fill #0

## 2018-12-03 MED FILL — FLUoxetine HCL 40 MG CAPS: 40 | 30 days supply | Qty: 30 | Fill #0

## 2018-12-03 MED FILL — $LANTUS SOLOSTAR 100 UNITS/: 100 | 28 days supply | Qty: 15 | Fill #0

## 2018-12-03 MED FILL — LISINOPRIL-HCTZ 20-12.5 MG: 20-12.5 | 30 days supply | Qty: 60 | Fill #0

## 2018-12-03 NOTE — Progress Notes (Signed)
Subjective:  Patient ID: Mariah Miller, female    DOB: 1975/06/18  Age: 43 y.o. MRN: [id number]  CC: Diabetes   HPI Mariah Miller is a 43 year old female with a history of type 2 diabetes mellitus (A1c 9.7), hypertension, depression who presents today for follow-up visit. Her blood pressure is elevated and she attributes this to being stressed about the fact that her childhood friend was diagnosed with throat cancer, one of whom passed away. She endorses compliance with her antihypertensive.  With regards to her diabetes mellitus she has been administering 45 units of Lantus rather than 52 units twice daily.  She endorses one occasion where her sugar was low at 56 but at other times she has elevated blood sugars. Denies blurry vision, numbness in extremities.  She is currently being seen by GI due to ongoing abdominal pains.  Tested negative for H. pylori bacteria and symptoms are uncontrolled on a PPI.  She has intermittent abdominal pains which are generalized with no diarrhea, nausea but does have bloating.  She is being worked up for gastroparesis.  Past Medical History:  Diagnosis Date  . Diabetes mellitus without complication ([place])   . Hypertension   . Migraine   . Seizures ([place])    self reported    Past Surgical History:  Procedure Laterality Date  . CHOLECYSTECTOMY    . CYST EXCISION    . SHOULDER OPEN ROTATOR CUFF REPAIR Left   . TONSILLECTOMY    . TUBAL LIGATION      Family History  Problem Relation Age of Onset  . Hypertension Mother   . Thyroid disease Mother   . Irregular heart beat Mother   . Bipolar disorder Sister     Allergies  Allergen Reactions  . Tomato Itching and Rash    Outpatient Medications Prior to Visit  Medication Sig Dispense Refill  . aspirin EC 81 MG tablet Take 1 tablet (81 mg total) by mouth daily. 30 tablet 5  . Blood Glucose Monitoring Suppl (TRUE METRIX METER) w/Device KIT USE AS DIRECTED 1 kit 0  . cetirizine (ZYRTEC)  10 MG tablet Take 1 tablet (10 mg total) by mouth daily. (Patient taking differently: Take 10 mg by mouth daily as needed for allergies. ) 30 tablet 1  . famotidine (PEPCID) 20 MG tablet Take 1 tablet (20 mg total) by mouth 2 (two) times daily. 20 tablet 0  . ferrous sulfate 325 (65 FE) MG tablet Take 1 tablet (325 mg total) by mouth 2 (two) times daily with a meal. 180 tablet 3  . furosemide (LASIX) 20 MG tablet Take 1 tablet (20 mg total) by mouth daily. As needed for pedal edema 30 tablet 0  . glucose blood (TRUE METRIX BLOOD GLUCOSE TEST) test strip Use as instructed 100 each 12  . metoCLOPramide (REGLAN) 10 MG tablet Take 1 tablet (10 mg total) by mouth every 8 (eight) hours as needed for nausea. 30 tablet 0  . sucralfate (CARAFATE) 1 g tablet Take 1 tablet (1 g total) by mouth 4 (four) times daily -  with meals and at bedtime. 60 tablet 0  . triamcinolone cream (KENALOG) 0.1 % Apply 1 application topically 2 (two) times daily. 45 g 1  . TRUEplus Lancets 28G MISC USE AS INSTRUCTED 100 each 5  . FLUoxetine (PROZAC) 40 MG capsule Take 1 capsule (40 mg total) by mouth daily. 90 capsule 1  . gabapentin (NEURONTIN) 300 MG capsule Take 1 capsule (300 mg total) by mouth 3 (  three) times daily. 90 capsule 3  . insulin aspart (NOVOLOG) 100 UNIT/ML injection Inject 0-12 Units into the skin 3 (three) times daily. As perSliding-scale 30 mL 3  . Insulin Glargine (LANTUS SOLOSTAR) 100 UNIT/ML Solostar Pen Inject 52 Units into the skin at bedtime. 30 mL 5  . liraglutide (VICTOZA) 18 MG/3ML SOPN Inject subcutaneously daily 0.6 mg for 1 week then 1.2 mg for 1 week then 1.8 mg thereafter (Patient taking differently: Inject 1.8 mg into the skin every morning. ) 30 mL 3  . lisinopril-hydrochlorothiazide (ZESTORETIC) 20-12.5 MG tablet Take 2 tablets by mouth daily. 180 tablet 1  . metFORMIN (GLUCOPHAGE) 1000 MG tablet Take 1 tablet (1,000 mg total) by mouth 2 (two) times daily with a meal. 180 tablet 1  . metoprolol  tartrate (LOPRESSOR) 25 MG tablet Take 1 tablet (25 mg total) by mouth 2 (two) times daily. 60 tablet 3  . pantoprazole (PROTONIX) 20 MG tablet Take 1 tablet (20 mg total) by mouth daily. 30 tablet 0  . simvastatin (ZOCOR) 40 MG tablet Take 1 tablet (40 mg total) by mouth at bedtime. 30 tablet 3  . traZODone (DESYREL) 100 MG tablet Take 1 tablet (100 mg total) by mouth at bedtime as needed for sleep. 30 tablet 3  . albuterol (PROVENTIL HFA;VENTOLIN HFA) 108 (90 Base) MCG/ACT inhaler Inhale 1-2 puffs into the lungs every 6 (six) hours as needed for wheezing or shortness of breath. (Patient not taking: Reported on 12/03/2018) 1 Inhaler 0  . benzonatate (TESSALON) 100 MG capsule Take 1 capsule (100 mg total) by mouth every 8 (eight) hours. (Patient not taking: Reported on 12/03/2018) 21 capsule 0   No facility-administered medications prior to visit.      ROS Review of Systems  Constitutional: Negative for activity change, appetite change and fatigue.  HENT: Negative for congestion, sinus pressure and sore throat.   Eyes: Negative for visual disturbance.  Respiratory: Negative for cough, chest tightness, shortness of breath and wheezing.   Cardiovascular: Negative for chest pain and palpitations.  Gastrointestinal: Positive for abdominal pain. Negative for abdominal distention and constipation.  Endocrine: Negative for polydipsia.  Genitourinary: Negative for dysuria and frequency.  Musculoskeletal: Negative for arthralgias and back pain.  Skin: Negative for rash.  Neurological: Negative for tremors, light-headedness and numbness.  Hematological: Does not bruise/bleed easily.  Psychiatric/Behavioral: Negative for agitation and behavioral problems.    Objective:  BP (!) 156/89   Pulse 85   Temp 98.3 F (36.8 C) (Oral)   Ht _0  (1.651 m)   Wt 208 lb 9.6 oz (94.6 kg)   SpO2 99%   BMI 34.71 kg/m   BP/Weight 12/03/2018 11/22/2018 06/23/3788  Systolic BP 240 973 532  Diastolic BP 89 64  82  Wt. (Lbs) 208.6 202 218  BMI 34.71 33.61 36.28      Physical Exam Constitutional:      Appearance: She is well-developed.  Neck:     Vascular: No JVD.  Cardiovascular:     Rate and Rhythm: Normal rate.     Heart sounds: Normal heart sounds. No murmur.  Pulmonary:     Effort: Pulmonary effort is normal.     Breath sounds: Normal breath sounds. No wheezing or rales.  Chest:     Chest wall: No tenderness.  Abdominal:     General: Bowel sounds are normal. There is no distension.     Palpations: Abdomen is soft. There is no mass.     Tenderness: There is no  abdominal tenderness.  Musculoskeletal: Normal range of motion.     Right lower leg: No edema.     Left lower leg: No edema.  Neurological:     Mental Status: She is alert and oriented to person, place, and time.  Psychiatric:        Mood and Affect: Mood normal.     CMP Latest Ref Rng & Units 06/17/2018 05/16/2018 10/05/2017  Glucose 70 - 99 mg/dL 163(H) 357(H) 488(H)  BUN 6 - 20 mg/dL _0 Creatinine 0.44 - 1.00 mg/dL 0.90 1.04(H) 0.80  Sodium 135 - 145 mmol/L 135 135 133(L)  Potassium 3.5 - 5.1 mmol/L 3.8 4.6 3.9  Chloride 98 - 111 mmol/L 106 98 101  CO2 22 - 32 mmol/L 21(L) 22 24  Calcium 8.9 - 10.3 mg/dL 8.8(L) 9.2 8.9  Total Protein 6.5 - 8.1 g/dL 6.7 7.2 6.9  Total Bilirubin 0.3 - 1.2 mg/dL 0.5 0.2 0.8  Alkaline Phos 38 - 126 U/L 57 81 67  AST 15 - 41 U/L _1 ALT 0 - 44 U/L _2 Lipid Panel     Component Value Date/Time   CHOL 212 (H) 05/16/2018 0950   TRIG 172 (H) 05/16/2018 0950   HDL 45 05/16/2018 0950   CHOLHDL 4.7 (H) 05/16/2018 0950   CHOLHDL 4.7 05/25/2016 1543   VLDL 37 (H) 05/25/2016 1543   LDLCALC 133 (H) 05/16/2018 0950    CBC    Component Value Date/Time   WBC 6.3 06/17/2018 2202   RBC 4.20 06/17/2018 2202   HGB 12.1 06/17/2018 2202   HCT 38.2 06/17/2018 2202   PLT 334 06/17/2018 2202   MCV 91.0 06/17/2018 2202   MCH 28.8 06/17/2018 2202   MCHC 31.7 06/17/2018  2202   RDW 12.9 06/17/2018 2202   LYMPHSABS 2.4 10/05/2017 1628   MONOABS 0.5 10/05/2017 1628   EOSABS 0.1 10/05/2017 1628   BASOSABS 0.0 10/05/2017 1628    Lab Results  Component Value Date   HGBA1C 9.6 (A) 12/03/2018    Assessment & Plan:   1. Type 2 diabetes mellitus with diabetic polyneuropathy, with long-term current use of insulin (HCC) Uncontrolled with A1c of 9.6 Advised to increase Lantus dose to 26 units twice daily; switching from once daily dosing to twice daily dosing to prevent hypoglycemia Counseled on Diabetic diet, my plate method, 376 minutes of moderate intensity exercise/week Keep blood sugar logs with fasting goals of 80-120 mg/dl, random of less than 180 and in the event of sugars less than 60 mg/dl or greater than 400 mg/dl please notify the clinic ASAP. It is recommended that you undergo annual eye exams and annual foot exams. Pneumonia vaccine is recommended. - POCT glucose (manual entry) - POCT glycosylated hemoglobin (Hb A1C) - gabapentin (NEURONTIN) 300 MG capsule; Take 1 capsule (300 mg total) by mouth 3 (three) times daily.  Dispense: 90 capsule; Refill: 3 - insulin aspart (NOVOLOG) 100 UNIT/ML injection; Inject 0-12 Units into the skin 3 (three) times daily. As perSliding-scale  Dispense: 30 mL; Refill: 3 - liraglutide (VICTOZA) 18 MG/3ML SOPN; Inject 0.3 mLs (1.8 mg total) into the skin every morning.  Dispense: 30 mL; Refill: 5 - simvastatin (ZOCOR) 40 MG tablet; Take 1 tablet (40 mg total) by mouth at bedtime.  Dispense: 30 tablet; Refill: 3 - metFORMIN (GLUCOPHAGE) 1000 MG tablet; Take 1 tablet (1,000 mg total) by mouth 2 (two) times daily with a meal.  Dispense: 180 tablet; Refill:  1  2. Uncontrolled type 2 diabetes mellitus with hyperglycemia, with long-term current use of insulin ([place]) See #1 above - Insulin Glargine (LANTUS SOLOSTAR) 100 UNIT/ML Solostar Pen; Inject 26 Units into the skin 2 (two) times daily.  Dispense: 30 mL; Refill: 5  3.  Moderate episode of recurrent major depressive disorder (HCC) Stable - FLUoxetine (PROZAC) 40 MG capsule; Take 1 capsule (40 mg total) by mouth daily.  Dispense: 90 capsule; Refill: 1  4. Essential hypertension Elevated blood pressure but she attributes this to stress Last blood pressure from previous office visit was normal Continue to work on lifestyle modifications, we have discussed stress management and review her blood pressure at next visit Counseled on blood pressure goal of less than 130/80, low-sodium, DASH diet, medication compliance, 150 minutes of moderate intensity exercise per week. Discussed medication compliance, adverse effects. - lisinopril-hydrochlorothiazide (ZESTORETIC) 20-12.5 MG tablet; Take 2 tablets by mouth daily.  Dispense: 180 tablet; Refill: 1 - metoprolol tartrate (LOPRESSOR) 25 MG tablet; Take 1 tablet (25 mg total) by mouth 2 (two) times daily.  Dispense: 60 tablet; Refill: 3  5. Other chronic gastritis without hemorrhage She does have intermittent abdominal pain Currently being worked up for gastroparesis by GI We have discussed the possibility of discontinuing Victoza if gastroparesis continues - pantoprazole (PROTONIX) 20 MG tablet; Take 1 tablet (20 mg total) by mouth daily.  Dispense: 30 tablet; Refill: 3  6. Primary insomnia Stable - traZODone (DESYREL) 100 MG tablet; Take 1 tablet (100 mg total) by mouth at bedtime as needed for sleep.  Dispense: 30 tablet; Refill: 3   Meds ordered this encounter  Medications  . Insulin Glargine (LANTUS SOLOSTAR) 100 UNIT/ML Solostar Pen    Sig: Inject 26 Units into the skin 2 (two) times daily.    Dispense:  30 mL    Refill:  5    Discontinue previous dose  . FLUoxetine (PROZAC) 40 MG capsule    Sig: Take 1 capsule (40 mg total) by mouth daily.    Dispense:  90 capsule    Refill:  1  . gabapentin (NEURONTIN) 300 MG capsule    Sig: Take 1 capsule (300 mg total) by mouth 3 (three) times daily.    Dispense:   90 capsule    Refill:  3  . insulin aspart (NOVOLOG) 100 UNIT/ML injection    Sig: Inject 0-12 Units into the skin 3 (three) times daily. As perSliding-scale    Dispense:  30 mL    Refill:  3  . liraglutide (VICTOZA) 18 MG/3ML SOPN    Sig: Inject 0.3 mLs (1.8 mg total) into the skin every morning.    Dispense:  30 mL    Refill:  5  . lisinopril-hydrochlorothiazide (ZESTORETIC) 20-12.5 MG tablet    Sig: Take 2 tablets by mouth daily.    Dispense:  180 tablet    Refill:  1  . simvastatin (ZOCOR) 40 MG tablet    Sig: Take 1 tablet (40 mg total) by mouth at bedtime.    Dispense:  30 tablet    Refill:  3    Dose change  . pantoprazole (PROTONIX) 20 MG tablet    Sig: Take 1 tablet (20 mg total) by mouth daily.    Dispense:  30 tablet    Refill:  3  . metFORMIN (GLUCOPHAGE) 1000 MG tablet    Sig: Take 1 tablet (1,000 mg total) by mouth 2 (two) times daily with a meal.    Dispense:  180  tablet    Refill:  1  . metoprolol tartrate (LOPRESSOR) 25 MG tablet    Sig: Take 1 tablet (25 mg total) by mouth 2 (two) times daily.    Dispense:  60 tablet    Refill:  3  . traZODone (DESYREL) 100 MG tablet    Sig: Take 1 tablet (100 mg total) by mouth at bedtime as needed for sleep.    Dispense:  30 tablet    Refill:  3    Discontinue previous dose    Follow-up: Return in about 3 months (around 03/04/2019) for medical conditions - in person.       Charlott Rakes, MD, FAAFP. [facility] and [place], [place]   12/03/2018, 10:45 AM

## 2018-12-03 NOTE — Patient Instructions (Signed)
Diabetes Mellitus and Exercise Exercising regularly is important for your overall health, especially when you have diabetes (diabetes mellitus). Exercising is not only about losing weight. It has many other health benefits, such as increasing muscle strength and bone density and reducing body fat and stress. This leads to improved fitness, flexibility, and endurance, all of which result in better overall health. Exercise has additional benefits for people with diabetes, including:  Reducing appetite.  Helping to lower and control blood glucose.  Lowering blood pressure.  Helping to control amounts of fatty substances (lipids) in the blood, such as cholesterol and triglycerides.  Helping the body to respond better to insulin (improving insulin sensitivity).  Reducing how much insulin the body needs.  Decreasing the risk for heart disease by: ? Lowering cholesterol and triglyceride levels. ? Increasing the levels of good cholesterol. ? Lowering blood glucose levels. What is my activity plan? Your health care provider or certified diabetes educator can help you make a plan for the type and frequency of exercise (activity plan) that works for you. Make sure that you:  Do at least 150 minutes of moderate-intensity or vigorous-intensity exercise each week. This could be brisk walking, biking, or water aerobics. ? Do stretching and strength exercises, such as yoga or weightlifting, at least 2 times a week. ? Spread out your activity over at least 3 days of the week.  Get some form of physical activity every day. ? Do not go more than 2 days in a row without some kind of physical activity. ? Avoid being inactive for more than 30 minutes at a time. Take frequent breaks to walk or stretch.  Choose a type of exercise or activity that you enjoy, and set realistic goals.  Start slowly, and gradually increase the intensity of your exercise over time. What do I need to know about managing my  diabetes?   Check your blood glucose before and after exercising. ? If your blood glucose is 240 mg/dL (13.3 mmol/L) or higher before you exercise, check your urine for ketones. If you have ketones in your urine, do not exercise until your blood glucose returns to normal. ? If your blood glucose is 100 mg/dL (5.6 mmol/L) or lower, eat a snack containing 15-20 grams of carbohydrate. Check your blood glucose 15 minutes after the snack to make sure that your level is above 100 mg/dL (5.6 mmol/L) before you start your exercise.  Know the symptoms of low blood glucose (hypoglycemia) and how to treat it. Your risk for hypoglycemia increases during and after exercise. Common symptoms of hypoglycemia can include: ? Hunger. ? Anxiety. ? Sweating and feeling clammy. ? Confusion. ? Dizziness or feeling light-headed. ? Increased heart rate or palpitations. ? Blurry vision. ? Tingling or numbness around the mouth, lips, or tongue. ? Tremors or shakes. ? Irritability.  Keep a rapid-acting carbohydrate snack available before, during, and after exercise to help prevent or treat hypoglycemia.  Avoid injecting insulin into areas of the body that are going to be exercised. For example, avoid injecting insulin into: ? The arms, when playing tennis. ? The legs, when jogging.  Keep records of your exercise habits. Doing this can help you and your health care provider adjust your diabetes management plan as needed. Write down: ? Food that you eat before and after you exercise. ? Blood glucose levels before and after you exercise. ? The type and amount of exercise you have done. ? When your insulin is expected to peak, if you use   insulin. Avoid exercising at times when your insulin is peaking.  When you start a new exercise or activity, work with your health care provider to make sure the activity is safe for you, and to adjust your insulin, medicines, or food intake as needed.  Drink plenty of water while  you exercise to prevent dehydration or heat stroke. Drink enough fluid to keep your urine clear or pale yellow. Summary  Exercising regularly is important for your overall health, especially when you have diabetes (diabetes mellitus).  Exercising has many health benefits, such as increasing muscle strength and bone density and reducing body fat and stress.  Your health care provider or certified diabetes educator can help you make a plan for the type and frequency of exercise (activity plan) that works for you.  When you start a new exercise or activity, work with your health care provider to make sure the activity is safe for you, and to adjust your insulin, medicines, or food intake as needed. This information is not intended to replace advice given to you by your health care provider. Make sure you discuss any questions you have with your health care provider. Document Released: 05/14/2003 Document Revised: 09/15/2016 Document Reviewed: 08/03/2015 Elsevier Patient Education  2020 [employer]  

## 2018-12-11 ENCOUNTER — Telehealth: Payer: Self-pay

## 2018-12-11 NOTE — Telephone Encounter (Signed)
Covid-19 screening questions   Do you now or have you had a fever in the last 14 days?  Do you have any respiratory symptoms of shortness of breath or cough now or in the last 14 days?  Do you have any family members or close contacts with diagnosed or suspected Covid-19 in the past 14 days?  Have you been tested for Covid-19 and found to be positive?       

## 2018-12-11 NOTE — Telephone Encounter (Signed)
Patient called back and answered "NO" to all screening questions. °

## 2018-12-12 ENCOUNTER — Ambulatory Visit (AMBULATORY_SURGERY_CENTER): Payer: PRIVATE HEALTH INSURANCE | Admitting: Gastroenterology

## 2018-12-12 ENCOUNTER — Other Ambulatory Visit: Payer: Self-pay

## 2018-12-12 ENCOUNTER — Encounter: Payer: Self-pay | Admitting: Gastroenterology

## 2018-12-12 VITALS — BP 133/73 | HR 76 | Temp 98.5°F | Resp 20 | Ht 65.0 in | Wt 202.0 lb

## 2018-12-12 DIAGNOSIS — K2970 Gastritis, unspecified, without bleeding: Secondary | ICD-10-CM | POA: Diagnosis not present

## 2018-12-12 DIAGNOSIS — K269 Duodenal ulcer, unspecified as acute or chronic, without hemorrhage or perforation: Secondary | ICD-10-CM | POA: Diagnosis not present

## 2018-12-12 DIAGNOSIS — R1012 Left upper quadrant pain: Secondary | ICD-10-CM

## 2018-12-12 DIAGNOSIS — R112 Nausea with vomiting, unspecified: Secondary | ICD-10-CM

## 2018-12-12 DIAGNOSIS — K219 Gastro-esophageal reflux disease without esophagitis: Secondary | ICD-10-CM

## 2018-12-12 MED ORDER — SODIUM CHLORIDE 0.9 % IV SOLN: 500.0000 mL | Freq: Once | INTRAVENOUS | Status: DC

## 2018-12-12 NOTE — Op Note (Addendum)
[facility] Patient Name: Mariah Miller Procedure Date: 12/12/2018 2:19 PM MRN: [id number] Endoscopist: Tressia DanasKimberly Presly Steinruck MD, MD Age: 43 Referring MD:  Date of Birth: 08/26/1975 Gender: Female Account #: [id number] Procedure:                Upper GI endoscopy Indications:              LUQ pain with associated bloating, eructation,                            early satiety, and nausea x2 months                           Diabetes mellitus with a hemoglobin A1c of 9.7                           Prior cholecystectomy for acute cholecystitis Medicines:                See the Anesthesia note for documentation of the                            administered medications Procedure:                Pre-Anesthesia Assessment:                           - Prior to the procedure, a History and Physical                            was performed, and patient medications and                            allergies were reviewed. The patient's tolerance of                            previous anesthesia was also reviewed. The risks                            and benefits of the procedure and the sedation                            options and risks were discussed with the patient.                            All questions were answered, and informed consent                            was obtained. Prior Anticoagulants: The patient has                            taken no previous anticoagulant or antiplatelet                            agents. ASA Grade Assessment: II - A patient with  mild systemic disease. After reviewing the risks                            and benefits, the patient was deemed in                            satisfactory condition to undergo the procedure.                           After obtaining informed consent, the endoscope was                            passed under direct vision. Throughout the                            procedure, the patient's blood  pressure, pulse, and                            oxygen saturations were monitored continuously. The                            Endoscope was introduced through the mouth, and                            advanced to the third part of duodenum. The upper                            GI endoscopy was accomplished without difficulty.                            The patient tolerated the procedure well. Scope In: Scope Out: Findings:                 The Z-line was slightly irregular. This is of                            unclear clinical significance. Biopsies were taken                            with a cold forceps for histology. Estimated blood                            loss was minimal.                           Patchy mild inflammation characterized by erosions,                            erythema and granularity was found in the gastric                            body and antrum. Biopsies were taken from the  antrum, body, and forceps with a cold forceps for                            histology. Estimated blood loss was minimal.                           One non-bleeding superficial duodenal ulcer with no                            stigmata of bleeding was found in the second                            portion of the duodenum. The lesion was less than                            one mm in largest dimension. Biopsies were taken                            from the ulcer, the duodenal bulb, and the second                            portion of the duodenum with a cold forceps for                            histology. Estimated blood loss was minimal.                           The cardia and gastric fundus were normal on                            retroflexion. A small hiatal hernia was present.                           The exam was otherwise without abnormality. Complications:            No immediate complications. Estimated blood loss:                             Minimal. Estimated Blood Loss:     Estimated blood loss was minimal. Impression:               - Z-line irregular. Biopsied.                           - Gastritis. Biopsied.                           - A small hiatal hernia.                           - Non-bleeding duodenal ulcer with no stigmata of                            bleeding. Biopsied.                           -  The examination was otherwise normal. Recommendation:           - Patient has a contact number available for                            emergencies. The signs and symptoms of potential                            delayed complications were discussed with the                            patient. Return to normal activities tomorrow.                            Written discharge instructions were provided to the                            patient.                           - Resume previous diet today.                           - Continue present medications Increase                            pantoprazole to 40 mg twice daily for 8 weeks.                           - Avoid all NSAIDs.                           - Await pathology results. Will plan gastric                            emptying scan if biopsies are nondiagnostic. Tressia Danas MD, MD 12/12/2018 2:41:44 PM This report has been signed electronically.

## 2018-12-12 NOTE — Progress Notes (Signed)
KA - Temp CW - VS   

## 2018-12-12 NOTE — Progress Notes (Signed)
Called to room to assist during endoscopic procedure.  Patient ID and intended procedure confirmed with present staff. Received instructions for my participation in the procedure from the performing physician.  

## 2018-12-12 NOTE — Progress Notes (Signed)
To PACU, VSS. Report to [email or web address] 

## 2018-12-12 NOTE — Patient Instructions (Signed)
YOU HAD AN ENDOSCOPIC PROCEDURE TODAY AT THE [facility]:   Refer to the procedure report that was given to you for any specific questions about what was found during the examination.  If the procedure report does not answer your questions, please call your gastroenterologist to clarify.  If you requested that your care partner not be given the details of your procedure findings, then the procedure report has been included in a sealed envelope for you to review at your convenience later.  YOU SHOULD EXPECT: Some feelings of bloating in the abdomen. Passage of more gas than usual.  Walking can help get rid of the air that was put into your GI tract during the procedure and reduce the bloating. If you had a lower endoscopy (such as a colonoscopy or flexible sigmoidoscopy) you may notice spotting of blood in your stool or on the toilet paper. If you underwent a bowel prep for your procedure, you may not have a normal bowel movement for a few days.  Please Note:  You might notice some irritation and congestion in your nose or some drainage.  This is from the oxygen used during your procedure.  There is no need for concern and it should clear up in a day or so.  SYMPTOMS TO REPORT IMMEDIATELY:   Following upper endoscopy (EGD)  Vomiting of blood or coffee ground material  New chest pain or pain under the shoulder blades  Painful or persistently difficult swallowing  New shortness of breath  Fever of 100F or higher  Black, tarry-looking stools  For urgent or emergent issues, a gastroenterologist can be reached at any hour by calling [phone].   DIET:  We do recommend a small meal at first, but then you may proceed to your regular diet.  Drink plenty of fluids but you should avoid alcoholic beverages for 24 hours.  ACTIVITY:  You should plan to take it easy for the rest of today and you should NOT DRIVE or use heavy machinery until tomorrow (because of the sedation medicines used  during the test).    FOLLOW UP: Our staff will call the number listed on your records 48-72 hours following your procedure to check on you and address any questions or concerns that you may have regarding the information given to you following your procedure. If we do not reach you, we will leave a message.  We will attempt to reach you two times.  During this call, we will ask if you have developed any symptoms of COVID 19. If you develop any symptoms (ie: fever, flu-like symptoms, shortness of breath, cough etc.) before then, please call [phone].  If you test positive for Covid 19 in the 2 weeks post procedure, please call and report this information to us.    If any biopsies were taken you will be contacted by phone or by letter within the next 1-3 weeks.  Please call us at [phone] if you have not heard about the biopsies in 3 weeks.    SIGNATURES/CONFIDENTIALITY: You and/or your care partner have signed paperwork which will be entered into your electronic medical record.  These signatures attest to the fact that that the information above on your After Visit Summary has been reviewed and is understood.  Full responsibility of the confidentiality of this discharge information lies with you and/or your care-partner. 

## 2018-12-14 ENCOUNTER — Telehealth: Payer: Self-pay

## 2018-12-14 ENCOUNTER — Telehealth: Payer: Self-pay | Admitting: *Deleted

## 2018-12-14 NOTE — Telephone Encounter (Signed)
Left message on answering machine. 

## 2018-12-14 NOTE — Telephone Encounter (Signed)
Second attempt, left VM.  

## 2018-12-20 ENCOUNTER — Other Ambulatory Visit: Payer: Self-pay | Admitting: *Deleted

## 2018-12-20 ENCOUNTER — Encounter: Payer: Self-pay | Admitting: Gastroenterology

## 2018-12-20 ENCOUNTER — Telehealth: Payer: Self-pay | Admitting: *Deleted

## 2018-12-20 DIAGNOSIS — R1012 Left upper quadrant pain: Secondary | ICD-10-CM

## 2018-12-20 DIAGNOSIS — R112 Nausea with vomiting, unspecified: Secondary | ICD-10-CM

## 2018-12-20 NOTE — Telephone Encounter (Signed)
GES order in Epic. Will schedule OV 2 approximately 2 weeks after study.  Called patient to schedule, left message for patient to call back.

## 2018-12-21 ENCOUNTER — Encounter: Payer: Self-pay | Admitting: *Deleted

## 2018-12-21 NOTE — Telephone Encounter (Signed)
Spoke to the patient, scheduled the patient for her GES on 10/30 at 7 am with an arrival time of 6:30 am. Patient aware to be NPO at midnight and to hold all stomach medications for 8 hours prior to the GES. Patient verbalized understanding. Patient requested letter, letter sent via MyChart and mailed.   Patient scheduled on 11/18 at 8:50 am for f/u post GES per Dr. Tarri Glenn.

## 2018-12-26 ENCOUNTER — Encounter (HOSPITAL_COMMUNITY): Payer: Self-pay | Admitting: Emergency Medicine

## 2018-12-26 ENCOUNTER — Other Ambulatory Visit: Payer: Self-pay

## 2018-12-26 ENCOUNTER — Emergency Department (HOSPITAL_COMMUNITY)
Admission: EM | Admit: 2018-12-26 | Discharge: 2018-12-26 | Disposition: A | Discharge status: Home / Self Care | Payer: PRIVATE HEALTH INSURANCE | Attending: Emergency Medicine | Admitting: Emergency Medicine

## 2018-12-26 DIAGNOSIS — Z3202 Encounter for pregnancy test, result negative: Secondary | ICD-10-CM | POA: Diagnosis not present

## 2018-12-26 DIAGNOSIS — Z794 Long term (current) use of insulin: Secondary | ICD-10-CM | POA: Diagnosis not present

## 2018-12-26 DIAGNOSIS — R102 Pelvic and perineal pain: Secondary | ICD-10-CM | POA: Insufficient documentation

## 2018-12-26 DIAGNOSIS — M79651 Pain in right thigh: Secondary | ICD-10-CM | POA: Insufficient documentation

## 2018-12-26 DIAGNOSIS — Z79899 Other long term (current) drug therapy: Secondary | ICD-10-CM | POA: Diagnosis not present

## 2018-12-26 DIAGNOSIS — I10 Essential (primary) hypertension: Secondary | ICD-10-CM | POA: Insufficient documentation

## 2018-12-26 DIAGNOSIS — Z87891 Personal history of nicotine dependence: Secondary | ICD-10-CM | POA: Insufficient documentation

## 2018-12-26 DIAGNOSIS — Z7982 Long term (current) use of aspirin: Secondary | ICD-10-CM | POA: Diagnosis not present

## 2018-12-26 DIAGNOSIS — E119 Type 2 diabetes mellitus without complications: Secondary | ICD-10-CM | POA: Diagnosis not present

## 2018-12-26 LAB — URINALYSIS, ROUTINE W REFLEX MICROSCOPIC
Bacteria, UA: NONE SEEN
Bilirubin Urine: NEGATIVE
Glucose, UA: >=500 mg/dL — AB
Hgb urine dipstick: NEGATIVE
Ketones, ur: NEGATIVE mg/dL
Leukocytes,Ua: NEGATIVE
Nitrite: NEGATIVE
Protein, ur: NEGATIVE mg/dL
Specific Gravity, Urine: 1.03 (ref 1.005–1.030)
pH: 6 (ref 5.0–8.0)

## 2018-12-26 LAB — POC URINE PREG, ED: Preg Test, Ur: NEGATIVE

## 2018-12-26 LAB — WET PREP, GENITAL
Sperm: NONE SEEN
Trich, Wet Prep: NONE SEEN
Yeast Wet Prep HPF POC: NONE SEEN

## 2018-12-26 LAB — HIV ANTIBODY (ROUTINE TESTING W REFLEX): HIV Screen 4th Generation wRfx: NONREACTIVE

## 2018-12-26 LAB — RPR: RPR Ser Ql: NONREACTIVE

## 2018-12-26 MED ORDER — CYCLOBENZAPRINE HCL 10 MG PO TABS: 10.0000 mg | ORAL_TABLET | Freq: Two times a day (BID) | ORAL | 0 refills | Status: DC | PRN

## 2018-12-26 MED ORDER — IBUPROFEN 600 MG PO TABS: 600.0000 mg | ORAL_TABLET | Freq: Four times a day (QID) | ORAL | 0 refills | Status: DC | PRN

## 2018-12-26 MED ORDER — CEFTRIAXONE SODIUM 250 MG IJ SOLR
250.0000 mg | Freq: Once | INTRAMUSCULAR | Status: AC
  Administered 2018-12-26: 250 mg via INTRAMUSCULAR
  Filled 2018-12-26: qty 250

## 2018-12-26 MED ORDER — AZITHROMYCIN 250 MG PO TABS
1000.0000 mg | ORAL_TABLET | Freq: Once | ORAL | Status: AC
  Administered 2018-12-26: 1000 mg via ORAL
  Filled 2018-12-26: qty 4

## 2018-12-26 MED ORDER — LIDOCAINE HCL (PF) 1 % IJ SOLN
INTRAMUSCULAR | Status: AC
  Administered 2018-12-26: 0.9 mL
  Filled 2018-12-26: qty 5

## 2018-12-26 MED FILL — IBUPROFEN 600 MG TABLET: 600 | 7 days supply | Qty: 30 | Fill #0

## 2018-12-26 MED FILL — CYCLOBENZAPRINE 10 MG TAB: 10 | 10 days supply | Qty: 20 | Fill #0

## 2018-12-26 NOTE — ED Provider Notes (Signed)
[facility] EMERGENCY DEPARTMENT Provider Note   CSN: [id number] Arrival date & time: 12/26/18  5364     History   Chief Complaint Chief Complaint  Patient presents with  . Pelvic Pain    HPI Mariah Miller is a 43 y.o. female.     The history is provided by the patient. No language interpreter was used.  Pelvic Pain     43 year old female with history of diabetes, diabetic neuropathy, impingement syndrome presenting for evaluation of pelvic pain.  Patient reports intermittent right side pelvic pain ongoing for the past 3 to 4 days.  She described pain as a sharp cramping sensation, waxing waning, sometimes worsened with movement.  She also noticed that her menstruation has been a bit off.  States that she had a normal period 4 days ago but now having just vaginal spotting which is different.  She reported prior history of ovarian cyst but unsure which side it was at.  Her pain is mild to moderate in severity.  She does not complain of any associated fever chills no back pain no vaginal discharge no new sexual partner.  The patient also complaining of intermittent right thigh and right leg tingling and weakness sensation with associated pain.  This started at the same time.  She admits to having history of peripheral neuropathy which she takes Neurontin for.  States that is related to her uncontrolled diabetes.  She is unsure if this is similar to that.  She denies any back pain bowel bladder incontinence or saddle anesthesia.  No fever, no headache, no vision changes.  No history of active cancer or IV drug use.  Denies any nausea vomiting diarrhea no postprandial pain and no loss of appetite.  Past Medical History:  Diagnosis Date  . Depression   . Diabetes mellitus without complication ([place])   . Hypertension   . Migraine   . Seizures ([place])    self reported - last seizure was Oct 2019 "absent seizure"    Patient Active Problem List   Diagnosis Date Noted   . Eczema 05/26/2017  . Primary insomnia 09/28/2016  . Anxiety and depression 05/26/2016  . OSA (obstructive sleep apnea) 04/30/2015  . Chest pain 04/28/2015  . DM2 (diabetes mellitus, type 2) ([place]) 04/28/2015  . ASCUS with positive high risk HPV cervical 06/06/2014  . Anemia, iron deficiency 03/17/2014  . Dyslipidemia 03/17/2014  . Essential hypertension 03/17/2014  . Uncontrolled diabetes mellitus ([place]) 06/17/2013  . Hypoglycemia 04/08/2013  . Diabetic neuropathy, painful ([place]) 03/21/2013  . Other and unspecified hyperlipidemia 01/22/2013  . A C DEGENERATION-CHRONIC 10/27/2008  . SHOULDER PAIN 05/01/2008  . IMPINGEMENT SYNDROME 05/01/2008    Past Surgical History:  Procedure Laterality Date  . CHOLECYSTECTOMY    . CYST EXCISION    . SHOULDER OPEN ROTATOR CUFF REPAIR Left   . TONSILLECTOMY    . TUBAL LIGATION       OB History    Gravida  6   Para  3   Term  3   Preterm      AB  3   Living  3     SAB  2   TAB      Ectopic      Multiple      Live Births  3            Home Medications    Prior to Admission medications   Medication Sig Start Date End Date Taking? Authorizing Provider  aspirin EC 81 MG tablet Take 1 tablet (81 mg total) by mouth daily. 01/22/13   Dhungel, Flonnie Overman, MD  Blood Glucose Monitoring Suppl (TRUE METRIX METER) w/Device KIT USE AS DIRECTED 05/16/18   Charlott Rakes, MD  ferrous sulfate 325 (65 FE) MG tablet Take 1 tablet (325 mg total) by mouth 2 (two) times daily with a meal. 04/24/17   Charlott Rakes, MD  furosemide (LASIX) 20 MG tablet Take 1 tablet (20 mg total) by mouth daily. As needed for pedal edema 08/29/18   Charlott Rakes, MD  gabapentin (NEURONTIN) 300 MG capsule Take 1 capsule (300 mg total) by mouth 3 (three) times daily. 12/03/18   Charlott Rakes, MD  glucose blood (TRUE METRIX BLOOD GLUCOSE TEST) test strip Use as instructed 05/16/18   Charlott Rakes, MD  insulin aspart (NOVOLOG) 100 UNIT/ML injection Inject 0-12  Units into the skin 3 (three) times daily. As perSliding-scale 12/03/18   Charlott Rakes, MD  Insulin Glargine (LANTUS SOLOSTAR) 100 UNIT/ML Solostar Pen Inject 26 Units into the skin 2 (two) times daily. 12/03/18   Charlott Rakes, MD  liraglutide (VICTOZA) 18 MG/3ML SOPN Inject 0.3 mLs (1.8 mg total) into the skin every morning. 12/03/18   Charlott Rakes, MD  lisinopril-hydrochlorothiazide (ZESTORETIC) 20-12.5 MG tablet Take 2 tablets by mouth daily. 12/03/18   Charlott Rakes, MD  metFORMIN (GLUCOPHAGE) 1000 MG tablet Take 1 tablet (1,000 mg total) by mouth 2 (two) times daily with a meal. 12/03/18   Charlott Rakes, MD  metoprolol tartrate (LOPRESSOR) 25 MG tablet Take 1 tablet (25 mg total) by mouth 2 (two) times daily. 12/03/18   Charlott Rakes, MD  simvastatin (ZOCOR) 40 MG tablet Take 1 tablet (40 mg total) by mouth at bedtime. 12/03/18   Charlott Rakes, MD  sucralfate (CARAFATE) 1 g tablet Take 1 tablet (1 g total) by mouth 4 (four) times daily -  with meals and at bedtime. 06/18/18   Muthersbaugh, Jarrett Soho, PA-C  traZODone (DESYREL) 100 MG tablet Take 1 tablet (100 mg total) by mouth at bedtime as needed for sleep. Patient not taking: Reported on 12/12/2018 12/03/18   Charlott Rakes, MD  TRUEplus Lancets 28G MISC USE AS INSTRUCTED 05/16/18   Charlott Rakes, MD    Family History Family History  Problem Relation Age of Onset  . Hypertension Mother   . Thyroid disease Mother   . Irregular heart beat Mother   . Bipolar disorder Sister   . Colon cancer Other   . Esophageal cancer Neg Hx   . Rectal cancer Neg Hx   . Stomach cancer Neg Hx     Social History Social History   Tobacco Use  . Smoking status: Former Smoker    Types: Cigarettes  . Smokeless tobacco: Never Used  . Tobacco comment: Smoked for 4 years, quit age 89  Substance Use Topics  . Alcohol use: Yes    Alcohol/week: 0.0 standard drinks    Comment: Holidays only  . Drug use: No     Allergies   Tomato   Review of  Systems Review of Systems  Genitourinary: Positive for pelvic pain.  All other systems reviewed and are negative.    Physical Exam Updated Vital Signs BP (!) 161/68 (BP Location: Left Arm)   Pulse 87   Temp 98.5 F (36.9 C) (Oral)   Resp 18   Ht '5\' 5"'$  (1.651 m)   Wt 93.4 kg   LMP 12/12/2018   SpO2 100%   BMI 34.28 kg/m   Physical  Exam Vitals signs and nursing note reviewed.  Constitutional:      General: She is not in acute distress.    Appearance: She is well-developed.  HENT:     Head: Atraumatic.  Eyes:     Conjunctiva/sclera: Conjunctivae normal.  Neck:     Musculoskeletal: Neck supple.  Cardiovascular:     Rate and Rhythm: Normal rate and regular rhythm.     Heart sounds: Normal heart sounds.  Pulmonary:     Breath sounds: Normal breath sounds.  Abdominal:     Palpations: Abdomen is soft.     Tenderness: There is no abdominal tenderness.     Comments: No significant reproducible abdominal tenderness on exam.  Negative Murphy sign, no pain at McBurney's point  Genitourinary:    Comments: Chaperone present during exam.  No inguinal lymphadenopathy or inguinal hernia noted.  Normal external genitalia.  No discomfort with speculum insertion.  There are dystrophic skin changes at the T-zone of the cervical os.  Os is closed.  On bimanual examination, right adnexal tenderness but no cervical motion tenderness.  Exam is limited due to large body habitus. Musculoskeletal:     Comments: 5 out of 5 strength left lower extremity, 4 out of 5 to right lower extremity.  Patellar deep tendon reflex intact bilaterally, no foot drop bilaterally, intact dorsalis pedis pulse, sensation is intact throughout bilateral lower extremities.  No significant midline spine tenderness.  Skin:    Findings: No rash.  Neurological:     Mental Status: She is alert.      ED Treatments / Results  Labs (all labs ordered are listed, but only abnormal results are displayed) Labs Reviewed  WET  PREP, GENITAL - Abnormal; Notable for the following components:      Result Value   Clue Cells Wet Prep HPF POC PRESENT (*)    WBC, Wet Prep HPF POC MANY (*)    All other components within normal limits  URINALYSIS, ROUTINE W REFLEX MICROSCOPIC - Abnormal; Notable for the following components:   Color, Urine STRAW (*)    Glucose, UA >=500 (*)    All other components within normal limits  HIV ANTIBODY (ROUTINE TESTING W REFLEX)  RPR  POC URINE PREG, ED  GC/CHLAMYDIA PROBE AMP ([place]) NOT AT [facility]    EKG None  Radiology No results found.  Procedures Procedures (including critical care time)  Medications Ordered in ED Medications  cefTRIAXone (ROCEPHIN) injection 250 mg (has no administration in time range)  azithromycin (ZITHROMAX) tablet 1,000 mg (has no administration in time range)  lidocaine (PF) (XYLOCAINE) 1 % injection (has no administration in time range)     Initial Impression / Assessment and Plan / ED Course  I have reviewed the triage vital signs and the nursing notes.  Pertinent labs & imaging results that were available during my care of the patient were reviewed by me and considered in my medical decision making (see chart for details).        BP 135/68 (BP Location: Right Arm)   Pulse 71   Temp 98.7 F (37.1 C) (Oral)   Resp 16   Ht '5\' 5"'$  (1.651 m)   Wt 93.4 kg   LMP 12/12/2018   SpO2 100%   BMI 34.28 kg/m    Final Clinical Impressions(s) / ED Diagnoses   Final diagnoses:  Pelvic pain  Right thigh pain    ED Discharge Orders         Ordered  ibuprofen (ADVIL) 600 MG tablet  Every 6 hours PRN     12/26/18 0950    cyclobenzaprine (FLEXERIL) 10 MG tablet  2 times daily PRN     12/26/18 0950         6:56 AM Patient with history of diabetes that is not well controlled here with pelvic discomfort and abnormal menstruation.  She would benefit from a pelvic examination.  She also reported intermittent weakness and tingling sensation  with pain to her right lower extremities.  She does have decreased strength to right lower extremity compared to left however sensation is intact, patellar deep tendon reflex is intact and she does not have any significant back pain.  I have low suspicion for cauda equina or discitis causing her symptoms.  Low suspicion for MS.  7:33 AM R adnexal tenderness without CMT on pelvic examination.   9:44 AM Wet prep shows evidence of clue cells and many WBC.  Pregnancy test is negative.  UA without signs of peritract infection.  STI testing pending.  Patient given Rocephin and Zithromax here for prophylaxis.  Patient will also go home with anti-inflammatory muscle relaxant for her right thigh pain.  Return discussed.  The patient has clue cells on her wet prep, I have low suspicion for BV as she does not have strong odor, or complaint of vaginal irritation   Domenic Moras, PA-C 12/26/18 [id number]    Merrily Pew, MD 12/27/18 0040

## 2018-12-26 NOTE — Discharge Instructions (Signed)
You have been screened for potential sexually transmitted infection that may cause pelvic pain.  You will be notify in the next 2-3 days if your test is positive for infection.  Take ibuprofen and/or flexeril as needed for pain.  Follow up with your doctor for further care.  Return if you have any concerns.

## 2018-12-26 NOTE — ED Triage Notes (Signed)
Per pt, she has been having right sided pelvic pain that causes "my right leg to get weak and go out."  Pt states she has had an irregular cycle this month, mostly just spotting.

## 2018-12-27 LAB — GC/CHLAMYDIA PROBE AMP (~~LOC~~) NOT AT ARMC
Chlamydia: NEGATIVE
Neisseria Gonorrhea: NEGATIVE

## 2018-12-27 MED FILL — $LANTUS SOLOSTAR 100 UNITS/: 100 | 28 days supply | Qty: 15 | Fill #0

## 2018-12-27 MED FILL — VICTOZA 18 MG/3 ML INJECT P: 18 | 30 days supply | Qty: 9 | Fill #0

## 2019-01-04 ENCOUNTER — Encounter (HOSPITAL_COMMUNITY)
Admission: RE | Admit: 2019-01-04 | Discharge: 2019-01-04 | Disposition: A | Discharge status: Home / Self Care | Payer: Self-pay | Source: Ambulatory Visit | Attending: Gastroenterology | Admitting: Gastroenterology

## 2019-01-04 ENCOUNTER — Other Ambulatory Visit: Payer: Self-pay

## 2019-01-04 DIAGNOSIS — R112 Nausea with vomiting, unspecified: Secondary | ICD-10-CM | POA: Insufficient documentation

## 2019-01-04 DIAGNOSIS — R1012 Left upper quadrant pain: Secondary | ICD-10-CM | POA: Insufficient documentation

## 2019-01-04 MED ORDER — TECHNETIUM TC 99M SULFUR COLLOID
2.0000 | Freq: Once | INTRAVENOUS | Status: AC | PRN
  Administered 2019-01-04: 2 via INTRAVENOUS

## 2019-01-09 ENCOUNTER — Encounter: Payer: PRIVATE HEALTH INSURANCE | Admitting: Family Medicine

## 2019-01-23 ENCOUNTER — Ambulatory Visit: Payer: PRIVATE HEALTH INSURANCE | Admitting: Gastroenterology

## 2019-02-04 ENCOUNTER — Other Ambulatory Visit: Payer: Self-pay

## 2019-02-04 ENCOUNTER — Ambulatory Visit: Payer: No Typology Code available for payment source | Attending: Family Medicine | Admitting: Family Medicine

## 2019-02-04 ENCOUNTER — Encounter: Payer: Self-pay | Admitting: Family Medicine

## 2019-02-04 VITALS — BP 148/79 | HR 98 | Temp 98.2°F | Ht 65.0 in | Wt 214.0 lb

## 2019-02-04 DIAGNOSIS — Z794 Long term (current) use of insulin: Secondary | ICD-10-CM

## 2019-02-04 DIAGNOSIS — E1165 Type 2 diabetes mellitus with hyperglycemia: Secondary | ICD-10-CM

## 2019-02-04 DIAGNOSIS — E1142 Type 2 diabetes mellitus with diabetic polyneuropathy: Secondary | ICD-10-CM

## 2019-02-04 DIAGNOSIS — I10 Essential (primary) hypertension: Secondary | ICD-10-CM

## 2019-02-04 DIAGNOSIS — B373 Candidiasis of vulva and vagina: Secondary | ICD-10-CM

## 2019-02-04 DIAGNOSIS — Z0000 Encounter for general adult medical examination without abnormal findings: Secondary | ICD-10-CM

## 2019-02-04 DIAGNOSIS — B3731 Acute candidiasis of vulva and vagina: Secondary | ICD-10-CM

## 2019-02-04 DIAGNOSIS — Z0001 Encounter for general adult medical examination with abnormal findings: Secondary | ICD-10-CM | POA: Diagnosis not present

## 2019-02-04 DIAGNOSIS — Z1231 Encounter for screening mammogram for malignant neoplasm of breast: Secondary | ICD-10-CM

## 2019-02-04 LAB — GLUCOSE, POCT (MANUAL RESULT ENTRY): POC Glucose: 205 mg/dl — AB (ref 70–99)

## 2019-02-04 LAB — POCT GLYCOSYLATED HEMOGLOBIN (HGB A1C): HbA1c, POC (controlled diabetic range): 9.8 % — AB (ref 0.0–7.0)

## 2019-02-04 MED ORDER — CLOTRIMAZOLE 1 % EX OINT: 1.0000 | TOPICAL_OINTMENT | Freq: Two times a day (BID) | CUTANEOUS | 0 refills | Status: DC

## 2019-02-04 MED ORDER — LANTUS SOLOSTAR 100 UNIT/ML ~~LOC~~ SOPN: 30.0000 [IU] | PEN_INJECTOR | Freq: Two times a day (BID) | SUBCUTANEOUS | 5 refills | Status: DC

## 2019-02-04 MED ORDER — AMLODIPINE BESYLATE 2.5 MG PO TABS: 2.5000 mg | ORAL_TABLET | Freq: Every day | ORAL | 3 refills | Status: DC

## 2019-02-04 MED FILL — $LANTUS SOLOSTAR 100 UNITS/: 100 | 30 days supply | Qty: 18 | Fill #0

## 2019-02-04 MED FILL — AMLODIPINE BESYLATE 2.5 MG: 2.5 | 30 days supply | Qty: 30 | Fill #0

## 2019-02-04 NOTE — Patient Instructions (Signed)
Health Maintenance, Female Adopting a healthy lifestyle and getting preventive care are important in promoting health and wellness. Ask your health care provider about:  The right schedule for you to have regular tests and exams.  Things you can do on your own to prevent diseases and keep yourself healthy. What should I know about diet, weight, and exercise? Eat a healthy diet   Eat a diet that includes plenty of vegetables, fruits, low-fat dairy products, and lean protein.  Do not eat a lot of foods that are high in solid fats, added sugars, or sodium. Maintain a healthy weight Body mass index (BMI) is used to identify weight problems. It estimates body fat based on height and weight. Your health care provider can help determine your BMI and help you achieve or maintain a healthy weight. Get regular exercise Get regular exercise. This is one of the most important things you can do for your health. Most adults should:  Exercise for at least 150 minutes each week. The exercise should increase your heart rate and make you sweat (moderate-intensity exercise).  Do strengthening exercises at least twice a week. This is in addition to the moderate-intensity exercise.  Spend less time sitting. Even light physical activity can be beneficial. Watch cholesterol and blood lipids Have your blood tested for lipids and cholesterol at 42 years of age, then have this test every 5 years. Have your cholesterol levels checked more often if:  Your lipid or cholesterol levels are high.  You are older than 42 years of age.  You are at high risk for heart disease. What should I know about cancer screening? Depending on your health history and family history, you may need to have cancer screening at various ages. This may include screening for:  Breast cancer.  Cervical cancer.  Colorectal cancer.  Skin cancer.  Lung cancer. What should I know about heart disease, diabetes, and high blood  pressure? Blood pressure and heart disease  High blood pressure causes heart disease and increases the risk of stroke. This is more likely to develop in people who have high blood pressure readings, are of African descent, or are overweight.  Have your blood pressure checked: ? Every 3-5 years if you are 18-39 years of age. ? Every year if you are 40 years old or older. Diabetes Have regular diabetes screenings. This checks your fasting blood sugar level. Have the screening done:  Once every three years after age 40 if you are at a normal weight and have a low risk for diabetes.  More often and at a younger age if you are overweight or have a high risk for diabetes. What should I know about preventing infection? Hepatitis B If you have a higher risk for hepatitis B, you should be screened for this virus. Talk with your health care provider to find out if you are at risk for hepatitis B infection. Hepatitis C Testing is recommended for:  Everyone born from 1945 through 1965.  Anyone with known risk factors for hepatitis C. Sexually transmitted infections (STIs)  Get screened for STIs, including gonorrhea and chlamydia, if: ? You are sexually active and are younger than 42 years of age. ? You are older than 42 years of age and your health care provider tells you that you are at risk for this type of infection. ? Your sexual activity has changed since you were last screened, and you are at increased risk for chlamydia or gonorrhea. Ask your health care provider if   you are at risk.  Ask your health care provider about whether you are at high risk for HIV. Your health care provider may recommend a prescription medicine to help prevent HIV infection. If you choose to take medicine to prevent HIV, you should first get tested for HIV. You should then be tested every 3 months for as long as you are taking the medicine. Pregnancy  If you are about to stop having your period (premenopausal) and  you may become pregnant, seek counseling before you get pregnant.  Take 400 to 800 micrograms (mcg) of folic acid every day if you become pregnant.  Ask for birth control (contraception) if you want to prevent pregnancy. Osteoporosis and menopause Osteoporosis is a disease in which the bones lose minerals and strength with aging. This can result in bone fractures. If you are 65 years old or older, or if you are at risk for osteoporosis and fractures, ask your health care provider if you should:  Be screened for bone loss.  Take a calcium or vitamin D supplement to lower your risk of fractures.  Be given hormone replacement therapy (HRT) to treat symptoms of menopause. Follow these instructions at home: Lifestyle  Do not use any products that contain nicotine or tobacco, such as cigarettes, e-cigarettes, and chewing tobacco. If you need help quitting, ask your health care provider.  Do not use street drugs.  Do not share needles.  Ask your health care provider for help if you need support or information about quitting drugs. Alcohol use  Do not drink alcohol if: ? Your health care provider tells you not to drink. ? You are pregnant, may be pregnant, or are planning to become pregnant.  If you drink alcohol: ? Limit how much you use to 0-1 drink a day. ? Limit intake if you are breastfeeding.  Be aware of how much alcohol is in your drink. In the [place] one drink equals one 12 oz bottle of beer (355 mL), one 5 oz glass of wine (148 mL), or one 1 oz glass of hard liquor (44 mL). General instructions  Schedule regular health, dental, and eye exams.  Stay current with your vaccines.  Tell your health care provider if: ? You often feel depressed. ? You have ever been abused or do not feel safe at home. Summary  Adopting a healthy lifestyle and getting preventive care are important in promoting health and wellness.  Follow your health care provider's instructions about healthy  diet, exercising, and getting tested or screened for diseases.  Follow your health care provider's instructions on monitoring your cholesterol and blood pressure. This information is not intended to replace advice given to you by your health care provider. Make sure you discuss any questions you have with your health care provider. Document Released: 09/06/2010 Document Revised: 02/14/2018 Document Reviewed: 02/14/2018 Elsevier Patient Education  2020 [employer]  

## 2019-02-04 NOTE — Progress Notes (Signed)
Subjective:  Patient ID: Mariah Miller, female    DOB: 18-Aug-1975  Age: 43 y.o. MRN: [id number]  CC: Annual Exam and Gynecologic Exam   HPI Mariah Miller is a 43 year old female with a history of type 2 diabetes mellitus (A1c 9.8), hypertension, depression who presents today for a complete physical exam Complains of vaginal itching but no discharge. Her last Pap smear was in 05/2017 and was normal.  She has never had a mammogram.  Her A1c is elevated at 9.8 and she endorses compliance with 26 units of Lantus twice daily.  Blood pressure is elevated and she has been compliant with her antihypertensive.  Past Medical History:  Diagnosis Date  . Depression   . Diabetes mellitus without complication ([place])   . Hypertension   . Migraine   . Seizures ([place])    self reported - last seizure was Oct 2019 "absent seizure"    Past Surgical History:  Procedure Laterality Date  . CHOLECYSTECTOMY    . CYST EXCISION    . SHOULDER OPEN ROTATOR CUFF REPAIR Left   . TONSILLECTOMY    . TUBAL LIGATION      Family History  Problem Relation Age of Onset  . Hypertension Mother   . Thyroid disease Mother   . Irregular heart beat Mother   . Bipolar disorder Sister   . Colon cancer Other   . Esophageal cancer Neg Hx   . Rectal cancer Neg Hx   . Stomach cancer Neg Hx     Allergies  Allergen Reactions  . Tomato Itching and Rash    Outpatient Medications Prior to Visit  Medication Sig Dispense Refill  . aspirin EC 81 MG tablet Take 1 tablet (81 mg total) by mouth daily. 30 tablet 5  . Blood Glucose Monitoring Suppl (TRUE METRIX METER) w/Device KIT USE AS DIRECTED 1 kit 0  . cyclobenzaprine (FLEXERIL) 10 MG tablet Take 1 tablet (10 mg total) by mouth 2 (two) times daily as needed for muscle spasms. 20 tablet 0  . ferrous sulfate 325 (65 FE) MG tablet Take 1 tablet (325 mg total) by mouth 2 (two) times daily with a meal. 180 tablet 3  . furosemide (LASIX) 20 MG tablet Take 1 tablet  (20 mg total) by mouth daily. As needed for pedal edema 30 tablet 0  . gabapentin (NEURONTIN) 300 MG capsule Take 1 capsule (300 mg total) by mouth 3 (three) times daily. 90 capsule 3  . glucose blood (TRUE METRIX BLOOD GLUCOSE TEST) test strip Use as instructed 100 each 12  . ibuprofen (ADVIL) 600 MG tablet Take 1 tablet (600 mg total) by mouth every 6 (six) hours as needed. 30 tablet 0  . insulin aspart (NOVOLOG) 100 UNIT/ML injection Inject 0-12 Units into the skin 3 (three) times daily. As perSliding-scale 30 mL 3  . Insulin Glargine (LANTUS SOLOSTAR) 100 UNIT/ML Solostar Pen Inject 26 Units into the skin 2 (two) times daily. 30 mL 5  . liraglutide (VICTOZA) 18 MG/3ML SOPN Inject 0.3 mLs (1.8 mg total) into the skin every morning. (Patient taking differently: Inject 1.8 mg into the skin daily. ) 30 mL 5  . lisinopril-hydrochlorothiazide (ZESTORETIC) 20-12.5 MG tablet Take 2 tablets by mouth daily. 180 tablet 1  . metFORMIN (GLUCOPHAGE) 1000 MG tablet Take 1 tablet (1,000 mg total) by mouth 2 (two) times daily with a meal. 180 tablet 1  . metoprolol tartrate (LOPRESSOR) 25 MG tablet Take 1 tablet (25 mg total) by mouth 2 (two)  times daily. 60 tablet 3  . simvastatin (ZOCOR) 40 MG tablet Take 1 tablet (40 mg total) by mouth at bedtime. 30 tablet 3  . sucralfate (CARAFATE) 1 g tablet Take 1 tablet (1 g total) by mouth 4 (four) times daily -  with meals and at bedtime. 60 tablet 0  . TRUEplus Lancets 28G MISC USE AS INSTRUCTED 100 each 5  . traZODone (DESYREL) 100 MG tablet Take 1 tablet (100 mg total) by mouth at bedtime as needed for sleep. (Patient not taking: Reported on 12/12/2018) 30 tablet 3   No facility-administered medications prior to visit.      ROS Review of Systems  Constitutional: Negative for activity change, appetite change and fatigue.  HENT: Negative for congestion, sinus pressure and sore throat.   Eyes: Negative for visual disturbance.  Respiratory: Negative for cough,  chest tightness, shortness of breath and wheezing.   Cardiovascular: Negative for chest pain and palpitations.  Gastrointestinal: Negative for abdominal distention, abdominal pain and constipation.  Endocrine: Negative for polydipsia.  Genitourinary: Negative for dysuria and frequency.  Musculoskeletal: Negative for arthralgias and back pain.  Skin: Negative for rash.  Neurological: Negative for tremors, light-headedness and numbness.  Hematological: Does not bruise/bleed easily.  Psychiatric/Behavioral: Negative for agitation and behavioral problems.    Objective:  BP (!) 153/80   Pulse 98   Temp 98.2 F (36.8 C) (Oral)   Ht '5\' 5"'$  (1.651 m)   Wt 214 lb (97.1 kg)   SpO2 100%   BMI 35.61 kg/m   BP/Weight 02/04/2019 12/26/2018 68/05/4194  Systolic BP 222 979 892  Diastolic BP 80 57 73  Wt. (Lbs) 214 206 202  BMI 35.61 34.28 33.61      Physical Exam Constitutional:      General: She is not in acute distress.    Appearance: She is well-developed. She is not diaphoretic.  HENT:     Head: Normocephalic.     Right Ear: External ear normal.     Left Ear: External ear normal.     Nose: Nose normal.  Eyes:     Conjunctiva/sclera: Conjunctivae normal.     Pupils: Pupils are equal, round, and reactive to light.  Neck:     Musculoskeletal: Normal range of motion.     Vascular: No JVD.  Cardiovascular:     Rate and Rhythm: Normal rate and regular rhythm.     Heart sounds: Normal heart sounds. No murmur. No gallop.   Pulmonary:     Effort: Pulmonary effort is normal. No respiratory distress.     Breath sounds: Normal breath sounds. No wheezing or rales.  Chest:     Chest wall: No tenderness.     Breasts:        Right: No mass or tenderness.        Left: No mass or tenderness.  Abdominal:     General: Bowel sounds are normal. There is no distension.     Palpations: Abdomen is soft. There is no mass.     Tenderness: There is no abdominal tenderness.  Genitourinary:     General: Normal vulva.     Comments: Vagina, cervix, adnexa - normal Musculoskeletal: Normal range of motion.        General: No tenderness.  Skin:    General: Skin is warm and dry.  Neurological:     Mental Status: She is alert and oriented to person, place, and time.     Deep Tendon Reflexes: Reflexes are normal and  symmetric.     CMP Latest Ref Rng & Units 06/17/2018 05/16/2018 10/05/2017  Glucose 70 - 99 mg/dL 163(H) 357(H) 488(H)  BUN 6 - 20 mg/dL '7 12 7  '$ Creatinine 0.44 - 1.00 mg/dL 0.90 1.04(H) 0.80  Sodium 135 - 145 mmol/L 135 135 133(L)  Potassium 3.5 - 5.1 mmol/L 3.8 4.6 3.9  Chloride 98 - 111 mmol/L 106 98 101  CO2 22 - 32 mmol/L 21(L) 22 24  Calcium 8.9 - 10.3 mg/dL 8.8(L) 9.2 8.9  Total Protein 6.5 - 8.1 g/dL 6.7 7.2 6.9  Total Bilirubin 0.3 - 1.2 mg/dL 0.5 0.2 0.8  Alkaline Phos 38 - 126 U/L 57 81 67  AST 15 - 41 U/L '16 16 15  '$ ALT 0 - 44 U/L '13 13 14    '$ Lipid Panel     Component Value Date/Time   CHOL 212 (H) 05/16/2018 0950   TRIG 172 (H) 05/16/2018 0950   HDL 45 05/16/2018 0950   CHOLHDL 4.7 (H) 05/16/2018 0950   CHOLHDL 4.7 05/25/2016 1543   VLDL 37 (H) 05/25/2016 1543   LDLCALC 133 (H) 05/16/2018 0950    CBC    Component Value Date/Time   WBC 6.3 06/17/2018 2202   RBC 4.20 06/17/2018 2202   HGB 12.1 06/17/2018 2202   HCT 38.2 06/17/2018 2202   PLT 334 06/17/2018 2202   MCV 91.0 06/17/2018 2202   MCH 28.8 06/17/2018 2202   MCHC 31.7 06/17/2018 2202   RDW 12.9 06/17/2018 2202   LYMPHSABS 2.4 10/05/2017 1628   MONOABS 0.5 10/05/2017 1628   EOSABS 0.1 10/05/2017 1628   BASOSABS 0.0 10/05/2017 1628    Lab Results  Component Value Date   HGBA1C 9.8 (A) 02/04/2019    Assessment & Plan:   1. Annual physical exam Counseled on 150 minutes of exercise per week, healthy eating (including decreased daily intake of saturated fats, cholesterol, added sugars, sodium), STI prevention, routine healthcare maintenance. - Complete Metabolic Panel with GFR  - Cervicovaginal ancillary only  2. Type 2 diabetes mellitus with diabetic polyneuropathy, with long-term current use of insulin (HCC) Uncontrolled with A1c of 9.8 Increase Lantus to 30 units twice daily - Glucose (CBG) - HgB A1c  3. Uncontrolled type 2 diabetes mellitus with hyperglycemia, with long-term current use of insulin (HCC) See #2 above - Insulin Glargine (LANTUS SOLOSTAR) 100 UNIT/ML Solostar Pen; Inject 30 Units into the skin 2 (two) times daily.  Dispense: 30 mL; Refill: 5  4. Encounter for screening mammogram for malignant neoplasm of breast - MM Digital Screening; Future  5. Vaginal candidiasis - Clotrimazole 1 % OINT; Apply 1 each topically 2 (two) times daily.  Dispense: 56.7 g; Refill: 0  6. Essential hypertension Uncontrolled Low-dose amlodipine added Counseled on blood pressure goal of less than 130/80, low-sodium, DASH diet, medication compliance, 150 minutes of moderate intensity exercise per week. Discussed medication compliance, adverse effects. - amLODipine (NORVASC) 2.5 MG tablet; Take 1 tablet (2.5 mg total) by mouth daily.  Dispense: 90 tablet; Refill: 3      Charlott Rakes, MD, FAAFP. [facility] [place], [place]   02/04/2019, 9:35 AM

## 2019-02-05 ENCOUNTER — Other Ambulatory Visit: Payer: Self-pay | Admitting: Family Medicine

## 2019-02-05 LAB — CERVICOVAGINAL ANCILLARY ONLY
Bacterial Vaginitis (gardnerella): POSITIVE — AB
Candida Glabrata: NEGATIVE
Candida Vaginitis: NEGATIVE
Chlamydia: NEGATIVE
Comment: NEGATIVE
Comment: NEGATIVE
Comment: NEGATIVE
Comment: NEGATIVE
Comment: NEGATIVE
Comment: NORMAL
Neisseria Gonorrhea: NEGATIVE
Trichomonas: NEGATIVE

## 2019-02-05 LAB — CMP14+EGFR
ALT: 9 IU/L (ref 0–32)
AST: 14 IU/L (ref 0–40)
Albumin/Globulin Ratio: 1.4 (ref 1.2–2.2)
Albumin: 4.2 g/dL (ref 3.8–4.8)
Alkaline Phosphatase: 74 IU/L (ref 39–117)
BUN/Creatinine Ratio: 13 (ref 9–23)
BUN: 12 mg/dL (ref 6–24)
Bilirubin Total: <0.2 mg/dL (ref 0.0–1.2)
CO2: 23 mmol/L (ref 20–29)
Calcium: 9.4 mg/dL (ref 8.7–10.2)
Chloride: 106 mmol/L (ref 96–106)
Creatinine, Ser: 0.93 mg/dL (ref 0.57–1.00)
GFR calc Af Amer: 88 mL/min/{1.73_m2} (ref >59)
GFR calc non Af Amer: 76 mL/min/{1.73_m2} (ref >59)
Globulin, Total: 3 g/dL (ref 1.5–4.5)
Glucose: 70 mg/dL (ref 65–99)
Potassium: 4.1 mmol/L (ref 3.5–5.2)
Sodium: 141 mmol/L (ref 134–144)
Total Protein: 7.2 g/dL (ref 6.0–8.5)

## 2019-02-05 MED ORDER — METRONIDAZOLE 0.75 % VA GEL: 1.0000 | Freq: Every day | VAGINAL | 0 refills | Status: DC

## 2019-02-05 MED FILL — metroNIDAZOLE 0.75 % GEL: 0.75 | 5 days supply | Qty: 70 | Fill #0

## 2019-02-18 ENCOUNTER — Telehealth: Payer: Self-pay | Admitting: Family Medicine

## 2019-02-18 NOTE — Telephone Encounter (Signed)
Patient called stating that she fell and was sore and her sugar had dropped to 18 and the job wants a return to work note

## 2019-02-18 NOTE — Telephone Encounter (Signed)
Patient dropped of FMLA paperwork as well.

## 2019-02-19 NOTE — Telephone Encounter (Signed)
Patient states that she has had this problem happen 3 times. Last time was yesterday her sugar was 25 and woke in a cold sweat she was seeing white spots.

## 2019-02-19 NOTE — Telephone Encounter (Signed)
Please advise to decrease Lantus dose to 28 units twice daily.  Use Humalog only with meals and according to sliding scale.  If she skips a meal, she does not need to administer Humalog.  Advised to inform us if hypoglycemia continues.

## 2019-02-19 NOTE — Telephone Encounter (Signed)
Can you please check with her to ensure she is not having recurrent hypoglycemic episodes because if she is we would need to cut back on her insulin dose.  She will also need to ensure she eats regularly to prevent hypoglycemia.

## 2019-02-20 NOTE — Telephone Encounter (Signed)
Patient was called and a voicemail was left informing patient to return phone call. 

## 2019-02-21 NOTE — Telephone Encounter (Signed)
Patient was called and informed of PCP medication change. Patient was informed to contact office is symptoms continue.

## 2019-02-22 ENCOUNTER — Ambulatory Visit: Payer: PRIVATE HEALTH INSURANCE | Admitting: Gastroenterology

## 2019-03-03 ENCOUNTER — Other Ambulatory Visit: Payer: Self-pay

## 2019-03-03 ENCOUNTER — Emergency Department (HOSPITAL_COMMUNITY)
Admission: EM | Admit: 2019-03-03 | Discharge: 2019-03-04 | Disposition: A | Discharge status: Home / Self Care | Payer: No Typology Code available for payment source | Attending: Emergency Medicine | Admitting: Emergency Medicine

## 2019-03-03 ENCOUNTER — Encounter (HOSPITAL_COMMUNITY): Payer: Self-pay | Admitting: *Deleted

## 2019-03-03 DIAGNOSIS — J029 Acute pharyngitis, unspecified: Secondary | ICD-10-CM | POA: Insufficient documentation

## 2019-03-03 DIAGNOSIS — Z7982 Long term (current) use of aspirin: Secondary | ICD-10-CM | POA: Insufficient documentation

## 2019-03-03 DIAGNOSIS — Z79899 Other long term (current) drug therapy: Secondary | ICD-10-CM | POA: Diagnosis not present

## 2019-03-03 DIAGNOSIS — Z794 Long term (current) use of insulin: Secondary | ICD-10-CM | POA: Diagnosis not present

## 2019-03-03 DIAGNOSIS — I10 Essential (primary) hypertension: Secondary | ICD-10-CM | POA: Insufficient documentation

## 2019-03-03 DIAGNOSIS — R519 Headache, unspecified: Secondary | ICD-10-CM | POA: Diagnosis present

## 2019-03-03 DIAGNOSIS — E119 Type 2 diabetes mellitus without complications: Secondary | ICD-10-CM | POA: Insufficient documentation

## 2019-03-03 DIAGNOSIS — Z20822 Contact with and (suspected) exposure to covid-19: Secondary | ICD-10-CM

## 2019-03-03 DIAGNOSIS — Z20828 Contact with and (suspected) exposure to other viral communicable diseases: Secondary | ICD-10-CM | POA: Diagnosis not present

## 2019-03-03 DIAGNOSIS — G44209 Tension-type headache, unspecified, not intractable: Secondary | ICD-10-CM | POA: Insufficient documentation

## 2019-03-03 NOTE — ED Triage Notes (Signed)
The pt is c/o a headache and throat pain since this am  She has taken tylenol  lmp november

## 2019-03-04 ENCOUNTER — Other Ambulatory Visit: Payer: Self-pay

## 2019-03-04 MED ORDER — KETOROLAC TROMETHAMINE 60 MG/2ML IM SOLN
60.0000 mg | Freq: Once | INTRAMUSCULAR | Status: AC
  Administered 2019-03-04: 05:00:00 60 mg via INTRAMUSCULAR
  Filled 2019-03-04: qty 2

## 2019-03-04 MED ORDER — METOCLOPRAMIDE HCL 10 MG PO TABS
10.0000 mg | ORAL_TABLET | Freq: Once | ORAL | Status: AC
  Administered 2019-03-04: 10 mg via ORAL
  Filled 2019-03-04: qty 1

## 2019-03-04 NOTE — ED Provider Notes (Signed)
[facility] EMERGENCY DEPARTMENT Provider Note   CSN: [id number] Arrival date & time: 03/03/19  2138     History Chief Complaint  Patient presents with  . Headache    Mariah Miller is a 43 y.o. female.   43 year old female with a history of diabetes, hypertension, migraine headaches presents to the emergency department for a headache which began yesterday morning and has remained constant.  She took Tylenol for symptoms without relief.  Her pain is located in her frontal scalp.  She denies any known aggravating factors of her symptoms as well as any associated photophobia, phonophobia, nausea, vomiting, extremity numbness or weakness.  She has had a slight sore throat as well, but no nasal congestion or sinus pressure.  She expresses concern that her symptoms may be related to Covid as she was told some individuals at her work tested positive, though she is unaware of any specific contact/exposure.  No recent fevers, cough, shortness of breath.  The history is provided by the patient. No language interpreter was used.  Headache      Past Medical History:  Diagnosis Date  . Depression   . Diabetes mellitus without complication ([place])   . Hypertension   . Migraine   . Seizures ([place])    self reported - last seizure was Oct 2019 "absent seizure"    Patient Active Problem List   Diagnosis Date Noted  . Eczema 05/26/2017  . Primary insomnia 09/28/2016  . Anxiety and depression 05/26/2016  . OSA (obstructive sleep apnea) 04/30/2015  . Chest pain 04/28/2015  . DM2 (diabetes mellitus, type 2) ([place]) 04/28/2015  . ASCUS with positive high risk HPV cervical 06/06/2014  . Anemia, iron deficiency 03/17/2014  . Dyslipidemia 03/17/2014  . Essential hypertension 03/17/2014  . Uncontrolled diabetes mellitus ([place]) 06/17/2013  . Hypoglycemia 04/08/2013  . Diabetic neuropathy, painful ([place]) 03/21/2013  . Other and unspecified hyperlipidemia 01/22/2013  . A C  DEGENERATION-CHRONIC 10/27/2008  . SHOULDER PAIN 05/01/2008  . IMPINGEMENT SYNDROME 05/01/2008    Past Surgical History:  Procedure Laterality Date  . CHOLECYSTECTOMY    . CYST EXCISION    . SHOULDER OPEN ROTATOR CUFF REPAIR Left   . TONSILLECTOMY    . TUBAL LIGATION       OB History    Gravida  6   Para  3   Term  3   Preterm      AB  3   Living  3     SAB  2   TAB      Ectopic      Multiple      Live Births  3           Family History  Problem Relation Age of Onset  . Hypertension Mother   . Thyroid disease Mother   . Irregular heart beat Mother   . Bipolar disorder Sister   . Colon cancer Other   . Esophageal cancer Neg Hx   . Rectal cancer Neg Hx   . Stomach cancer Neg Hx     Social History   Tobacco Use  . Smoking status: Former Smoker    Types: Cigarettes  . Smokeless tobacco: Never Used  . Tobacco comment: Smoked for 4 years, quit age 43  Substance Use Topics  . Alcohol use: Yes    Alcohol/week: 0.0 standard drinks    Comment: Holidays only  . Drug use: No    Home Medications Prior to Admission medications  Medication Sig Start Date End Date Taking? Authorizing Provider  amLODipine (NORVASC) 2.5 MG tablet Take 1 tablet (2.5 mg total) by mouth daily. 02/04/19   Charlott Rakes, MD  aspirin EC 81 MG tablet Take 1 tablet (81 mg total) by mouth daily. 01/22/13   Dhungel, Flonnie Overman, MD  Blood Glucose Monitoring Suppl (TRUE METRIX METER) w/Device KIT USE AS DIRECTED 05/16/18   Charlott Rakes, MD  Clotrimazole 1 % OINT Apply 1 each topically 2 (two) times daily. 02/04/19   Charlott Rakes, MD  cyclobenzaprine (FLEXERIL) 10 MG tablet Take 1 tablet (10 mg total) by mouth 2 (two) times daily as needed for muscle spasms. 12/26/18   Domenic Moras, PA-C  ferrous sulfate 325 (65 FE) MG tablet Take 1 tablet (325 mg total) by mouth 2 (two) times daily with a meal. 04/24/17   Charlott Rakes, MD  furosemide (LASIX) 20 MG tablet Take 1 tablet (20 mg  total) by mouth daily. As needed for pedal edema 08/29/18   Charlott Rakes, MD  gabapentin (NEURONTIN) 300 MG capsule Take 1 capsule (300 mg total) by mouth 3 (three) times daily. 12/03/18   Charlott Rakes, MD  glucose blood (TRUE METRIX BLOOD GLUCOSE TEST) test strip Use as instructed 05/16/18   Charlott Rakes, MD  ibuprofen (ADVIL) 600 MG tablet Take 1 tablet (600 mg total) by mouth every 6 (six) hours as needed. 12/26/18   Domenic Moras, PA-C  insulin aspart (NOVOLOG) 100 UNIT/ML injection Inject 0-12 Units into the skin 3 (three) times daily. As perSliding-scale 12/03/18   Charlott Rakes, MD  Insulin Glargine (LANTUS SOLOSTAR) 100 UNIT/ML Solostar Pen Inject 30 Units into the skin 2 (two) times daily. 02/04/19   Charlott Rakes, MD  liraglutide (VICTOZA) 18 MG/3ML SOPN Inject 0.3 mLs (1.8 mg total) into the skin every morning. Patient taking differently: Inject 1.8 mg into the skin daily.  12/03/18   Charlott Rakes, MD  lisinopril-hydrochlorothiazide (ZESTORETIC) 20-12.5 MG tablet Take 2 tablets by mouth daily. 12/03/18   Charlott Rakes, MD  metFORMIN (GLUCOPHAGE) 1000 MG tablet Take 1 tablet (1,000 mg total) by mouth 2 (two) times daily with a meal. 12/03/18   Charlott Rakes, MD  metoprolol tartrate (LOPRESSOR) 25 MG tablet Take 1 tablet (25 mg total) by mouth 2 (two) times daily. 12/03/18   Charlott Rakes, MD  metroNIDAZOLE (METROGEL VAGINAL) 0.75 % vaginal gel Place 1 Applicatorful vaginally at bedtime. 02/05/19   Charlott Rakes, MD  simvastatin (ZOCOR) 40 MG tablet Take 1 tablet (40 mg total) by mouth at bedtime. 12/03/18   Charlott Rakes, MD  sucralfate (CARAFATE) 1 g tablet Take 1 tablet (1 g total) by mouth 4 (four) times daily -  with meals and at bedtime. 06/18/18   Muthersbaugh, Jarrett Soho, PA-C  traZODone (DESYREL) 100 MG tablet Take 1 tablet (100 mg total) by mouth at bedtime as needed for sleep. Patient not taking: Reported on 12/12/2018 12/03/18   Charlott Rakes, MD  TRUEplus Lancets 28G  MISC USE AS INSTRUCTED 05/16/18   Charlott Rakes, MD    Allergies    Tomato  Review of Systems   Review of Systems  Neurological: Positive for headaches.  Ten systems reviewed and are negative for acute change, except as noted in the HPI.    Physical Exam Updated Vital Signs BP 133/70   Pulse 76   Temp 98 F (36.7 C) (Oral)   Resp 18   Ht '5\' 5"'$  (1.651 m)   Wt 93 kg   LMP 02/01/2019   SpO2  100%   BMI 34.11 kg/m   Physical Exam Vitals and nursing note reviewed.  Constitutional:      General: She is not in acute distress.    Appearance: She is well-developed. She is not diaphoretic.     Comments: Obese AA female, in NAD  HENT:     Head: Normocephalic and atraumatic.     Right Ear: Tympanic membrane, ear canal and external ear normal.     Left Ear: Tympanic membrane, ear canal and external ear normal.     Mouth/Throat:     Comments: Mild posterior oropharyngeal erythema without edema.  No exudates.  Uvula midline.  Normal phonation.  Tolerating secretions without difficulty. Eyes:     General: No scleral icterus.    Conjunctiva/sclera: Conjunctivae normal.  Pulmonary:     Effort: Pulmonary effort is normal. No respiratory distress.     Comments: Respirations even and unlabored Musculoskeletal:        General: Normal range of motion.     Cervical back: Normal range of motion.  Skin:    General: Skin is warm and dry.     Coloration: Skin is not pale.     Findings: No erythema or rash.  Neurological:     Mental Status: She is alert and oriented to person, place, and time.     Comments: GCS 15. Speech is goal oriented. No cranial nerve deficits appreciated; symmetric eyebrow raise, no facial drooping, tongue midline. Patient has equal grip strength bilaterally with 5/5 strength against resistance in all major muscle groups bilaterally. Sensation to light touch intact. Patient moves extremities without ataxia.  Psychiatric:        Behavior: Behavior normal.     ED  Results / Procedures / Treatments   Labs (all labs ordered are listed, but only abnormal results are displayed) Labs Reviewed  NOVEL CORONAVIRUS, NAA (HOSP ORDER, SEND-OUT TO REF LAB; TAT 18-24 HRS)    EKG None  Radiology No results found.  Procedures Procedures (including critical care time)  Medications Ordered in ED Medications  ketorolac (TORADOL) injection 60 mg (60 mg Intramuscular Given 03/04/19 0508)  metoCLOPramide (REGLAN) tablet 10 mg (10 mg Oral Given 03/04/19 0177)    ED Course  I have reviewed the triage vital signs and the nursing notes.  Pertinent labs & imaging results that were available during my care of the patient were reviewed by me and considered in my medical decision making (see chart for details).  5:35 AM Headache is easing off after medications. Patient expresses comfort with discharge.     MDM Rules/Calculators/A&P                       Patient presents to the emergency department for evaluation of headache which began yesterday morning.  Patient with no history of recent head injury or trauma.  No fever, nuchal rigidity, meningismus to suggest meningitis.  Neurologic exam today is nonfocal.  On reassessment, the patient has had significant improvement in headache symptoms following a migraine cocktail.  I do not believe further emergent workup is indicated at this time.  Return precautions discussed and provided.  Patient discharged in stable condition with no unaddressed concerns.  Mariah Miller was evaluated in Emergency Department on 03/04/2019 for the symptoms described in the history of present illness. She was evaluated in the context of the global COVID-19 pandemic, which necessitated consideration that the patient might be at risk for infection with the SARS-CoV-2 virus that causes  COVID-19. Institutional protocols and algorithms that pertain to the evaluation of patients at risk for COVID-19 are in a state of rapid change based on  information released by regulatory bodies including the CDC and federal and state organizations. These policies and algorithms were followed during the patient's care in the ED.   Final Clinical Impression(s) / ED Diagnoses Final diagnoses:  Tension headache  Pharyngitis, unspecified etiology  Encounter for laboratory testing for COVID-19 virus    Rx / DC Orders ED Discharge Orders    None       Antonietta Breach, PA-C 23/55/73 2202    Delora Fuel, MD 54/27/06 [phone]

## 2019-03-04 NOTE — Discharge Instructions (Signed)
You have a Covid test pending.  Follow-up on these test results in 24 to 48 hours through [facility].  You should also receive a call if your test returns positive.  Quarantine until these results return.  If you test positive for Covid, your quarantine will need to be extended for 14 days.  We recommend Tylenol or Excedrin Migraine for any persistent headache.  Use salt water gargles and drink plenty of warm fluids to soothe your throat.  You may also use over-the-counter throat lozenges or Chloraseptic spray for management of ongoing pain.

## 2019-03-04 NOTE — ED Notes (Signed)
Pt c/o 9/10 HA, while she is playing video games on her cell phone on the ED room.

## 2019-03-05 LAB — NOVEL CORONAVIRUS, NAA (HOSP ORDER, SEND-OUT TO REF LAB; TAT 18-24 HRS): SARS-CoV-2, NAA: NOT DETECTED

## 2019-03-06 ENCOUNTER — Ambulatory Visit: Payer: PRIVATE HEALTH INSURANCE | Admitting: Family Medicine

## 2019-03-06 MED FILL — !NOVOLOG FLEXPEN SYRINGE 1: 100/ML | 24 days supply | Qty: 9 | Fill #1

## 2019-03-07 ENCOUNTER — Other Ambulatory Visit: Payer: Self-pay | Admitting: Pharmacist

## 2019-03-07 MED ORDER — TRUEPLUS 5-BEVEL PEN NEEDLES 32G X 4 MM MISC: 11 refills | Status: DC

## 2019-03-07 MED FILL — TRUEPLUS 5-BEVEL PEN NEEDLE: 31G X 5 MM | 25 days supply | Qty: 100 | Fill #0

## 2019-03-13 NOTE — Telephone Encounter (Signed)
Patient called about FMLA papers

## 2019-03-20 ENCOUNTER — Ambulatory Visit: Payer: No Typology Code available for payment source | Admitting: Gastroenterology

## 2019-03-29 ENCOUNTER — Encounter: Payer: Self-pay | Admitting: Family Medicine

## 2019-04-15 ENCOUNTER — Telehealth: Payer: Self-pay | Admitting: Family Medicine

## 2019-04-15 NOTE — Telephone Encounter (Signed)
Patient was called and informed that she can pick paperwork up at the front desk.

## 2019-04-15 NOTE — Telephone Encounter (Signed)
Patient calling in regards to [facility] paperwork. She stated that their fax machine has been down and would like to pick them up.

## 2019-04-19 NOTE — Telephone Encounter (Signed)
Picked up fmla appointment

## 2019-05-03 MED FILL — $LANTUS SOLOSTAR 100 UNITS/: 100 | 28 days supply | Qty: 15 | Fill #1

## 2019-05-07 ENCOUNTER — Ambulatory Visit: Payer: No Typology Code available for payment source | Attending: Family Medicine | Admitting: Family Medicine

## 2019-05-07 ENCOUNTER — Other Ambulatory Visit: Payer: Self-pay

## 2019-05-07 ENCOUNTER — Encounter: Payer: Self-pay | Admitting: Family Medicine

## 2019-05-07 VITALS — BP 153/83 | HR 82 | Ht 65.0 in | Wt 202.0 lb

## 2019-05-07 DIAGNOSIS — Z794 Long term (current) use of insulin: Secondary | ICD-10-CM

## 2019-05-07 DIAGNOSIS — M5431 Sciatica, right side: Secondary | ICD-10-CM | POA: Diagnosis not present

## 2019-05-07 DIAGNOSIS — E785 Hyperlipidemia, unspecified: Secondary | ICD-10-CM

## 2019-05-07 DIAGNOSIS — E1169 Type 2 diabetes mellitus with other specified complication: Secondary | ICD-10-CM

## 2019-05-07 DIAGNOSIS — I10 Essential (primary) hypertension: Secondary | ICD-10-CM | POA: Diagnosis not present

## 2019-05-07 DIAGNOSIS — E1142 Type 2 diabetes mellitus with diabetic polyneuropathy: Secondary | ICD-10-CM

## 2019-05-07 LAB — POCT GLYCOSYLATED HEMOGLOBIN (HGB A1C): HbA1c, POC (controlled diabetic range): 11.1 % — AB (ref 0.0–7.0)

## 2019-05-07 LAB — GLUCOSE, POCT (MANUAL RESULT ENTRY): POC Glucose: 395 mg/dl — AB (ref 70–99)

## 2019-05-07 MED ORDER — LISINOPRIL-HYDROCHLOROTHIAZIDE 20-12.5 MG PO TABS: 2.0000 | ORAL_TABLET | Freq: Every day | ORAL | 1 refills | Status: DC

## 2019-05-07 MED ORDER — GABAPENTIN 300 MG PO CAPS: 300.0000 mg | ORAL_CAPSULE | Freq: Three times a day (TID) | ORAL | 3 refills | Status: DC

## 2019-05-07 MED ORDER — VICTOZA 18 MG/3ML ~~LOC~~ SOPN: 1.8000 mg | PEN_INJECTOR | Freq: Every day | SUBCUTANEOUS | 6 refills | Status: DC

## 2019-05-07 MED ORDER — INSULIN ASPART 100 UNIT/ML ~~LOC~~ SOLN: 0.0000 [IU] | Freq: Three times a day (TID) | SUBCUTANEOUS | 3 refills | Status: DC

## 2019-05-07 MED ORDER — METOPROLOL TARTRATE 50 MG PO TABS: 50.0000 mg | ORAL_TABLET | Freq: Two times a day (BID) | ORAL | 6 refills | Status: DC

## 2019-05-07 MED ORDER — SIMVASTATIN 40 MG PO TABS: 40.0000 mg | ORAL_TABLET | Freq: Every day | ORAL | 3 refills | Status: DC

## 2019-05-07 MED ORDER — METFORMIN HCL 1000 MG PO TABS: 1000.0000 mg | ORAL_TABLET | Freq: Two times a day (BID) | ORAL | 1 refills | Status: DC

## 2019-05-07 MED FILL — metFORMIN HCL 1000 MG TABS: 1000 | 30 days supply | Qty: 60 | Fill #0

## 2019-05-07 MED FILL — METOPROLOL TARTRATE 50 MG T: 50 | 30 days supply | Qty: 60 | Fill #0

## 2019-05-07 MED FILL — VICTOZA 18 MG/3 ML INJECT P: 18 | 30 days supply | Qty: 9 | Fill #0

## 2019-05-07 MED FILL — !NOVOLOG 100UNITS/ML VIAL: 100/ML | 27 days supply | Qty: 10 | Fill #0

## 2019-05-07 MED FILL — LISINOPRIL-HCTZ 20-12.5 MG: 20-12.5 | 30 days supply | Qty: 60 | Fill #0

## 2019-05-07 MED FILL — GABAPENTIN 300 MG CAPSULE: 300 | 30 days supply | Qty: 90 | Fill #0

## 2019-05-07 MED FILL — SIMVASTATIN 40 MG TABLET: 40 | 30 days supply | Qty: 30 | Fill #0

## 2019-05-07 NOTE — Progress Notes (Signed)
No lantus in 3 days. Pain in legs

## 2019-05-07 NOTE — Patient Instructions (Addendum)

## 2019-05-07 NOTE — Progress Notes (Signed)
Subjective:  Patient ID: Mariah Miller, female    DOB: 05/13/1975  Age: 44 y.o. MRN: [id number]  CC: Diabetes   HPI Mariah Miller  is a 44 year old female with a history of type 2 diabetes mellitus (A1c 9.7), hypertension, depression who presents today for follow-up visit. Her A1c is elevated at 11.1 which is up from 9.7 previously. Sugars at work are high as she snacks in between her meals- 383 was her most recent random sugar. She has been administering 26 units of Lantus bid rather than 30 units twice daily.  She endorses compliance with a sliding scale and Victoza prescribed.  She does not exercise regularly.  Denies visual concerns or numbness in extremities.  Her legs have been painful more so when she wakes up in the morning and she stumbled and fell when she took her first step upon getting up from her bed.  Symptoms are absent at this time but sometimes radiates down her right lower extremity and described as sharp with associated numbness. Complains of pedal edema which is intermittent.  Past Medical History:  Diagnosis Date  . Depression   . Diabetes mellitus without complication ([place])   . Hypertension   . Migraine   . Seizures ([place])    self reported - last seizure was Oct 2019 "absent seizure"    Past Surgical History:  Procedure Laterality Date  . CHOLECYSTECTOMY    . CYST EXCISION    . SHOULDER OPEN ROTATOR CUFF REPAIR Left   . TONSILLECTOMY    . TUBAL LIGATION      Family History  Problem Relation Age of Onset  . Hypertension Mother   . Thyroid disease Mother   . Irregular heart beat Mother   . Bipolar disorder Sister   . Colon cancer Other   . Esophageal cancer Neg Hx   . Rectal cancer Neg Hx   . Stomach cancer Neg Hx     Allergies  Allergen Reactions  . Tomato Itching and Rash    Outpatient Medications Prior to Visit  Medication Sig Dispense Refill  . amLODipine (NORVASC) 2.5 MG tablet Take 1 tablet (2.5 mg total) by mouth daily. 90  tablet 3  . aspirin EC 81 MG tablet Take 1 tablet (81 mg total) by mouth daily. 30 tablet 5  . Blood Glucose Monitoring Suppl (TRUE METRIX METER) w/Device KIT USE AS DIRECTED 1 kit 0  . Clotrimazole 1 % OINT Apply 1 each topically 2 (two) times daily. 56.7 g 0  . cyclobenzaprine (FLEXERIL) 10 MG tablet Take 1 tablet (10 mg total) by mouth 2 (two) times daily as needed for muscle spasms. 20 tablet 0  . ferrous sulfate 325 (65 FE) MG tablet Take 1 tablet (325 mg total) by mouth 2 (two) times daily with a meal. 180 tablet 3  . furosemide (LASIX) 20 MG tablet Take 1 tablet (20 mg total) by mouth daily. As needed for pedal edema 30 tablet 0  . gabapentin (NEURONTIN) 300 MG capsule Take 1 capsule (300 mg total) by mouth 3 (three) times daily. 90 capsule 3  . glucose blood (TRUE METRIX BLOOD GLUCOSE TEST) test strip Use as instructed 100 each 12  . ibuprofen (ADVIL) 600 MG tablet Take 1 tablet (600 mg total) by mouth every 6 (six) hours as needed. 30 tablet 0  . insulin aspart (NOVOLOG) 100 UNIT/ML injection Inject 0-12 Units into the skin 3 (three) times daily. As perSliding-scale 30 mL 3  . Insulin Glargine (LANTUS SOLOSTAR)  100 UNIT/ML Solostar Pen Inject 30 Units into the skin 2 (two) times daily. 30 mL 5  . Insulin Pen Needle (TRUEPLUS 5-BEVEL PEN NEEDLES) 32G X 4 MM MISC Use as instructed to inject insulin and Victoza daily. 100 each 11  . liraglutide (VICTOZA) 18 MG/3ML SOPN Inject 0.3 mLs (1.8 mg total) into the skin every morning. (Patient taking differently: Inject 1.8 mg into the skin daily. ) 30 mL 5  . lisinopril-hydrochlorothiazide (ZESTORETIC) 20-12.5 MG tablet Take 2 tablets by mouth daily. 180 tablet 1  . metFORMIN (GLUCOPHAGE) 1000 MG tablet Take 1 tablet (1,000 mg total) by mouth 2 (two) times daily with a meal. 180 tablet 1  . metoprolol tartrate (LOPRESSOR) 25 MG tablet Take 1 tablet (25 mg total) by mouth 2 (two) times daily. 60 tablet 3  . simvastatin (ZOCOR) 40 MG tablet Take 1  tablet (40 mg total) by mouth at bedtime. 30 tablet 3  . sucralfate (CARAFATE) 1 g tablet Take 1 tablet (1 g total) by mouth 4 (four) times daily -  with meals and at bedtime. 60 tablet 0  . traZODone (DESYREL) 100 MG tablet Take 1 tablet (100 mg total) by mouth at bedtime as needed for sleep. 30 tablet 3  . TRUEplus Lancets 28G MISC USE AS INSTRUCTED 100 each 5  . metroNIDAZOLE (METROGEL VAGINAL) 0.75 % vaginal gel Place 1 Applicatorful vaginally at bedtime. (Patient not taking: Reported on 05/07/2019) 70 g 0   No facility-administered medications prior to visit.     ROS Review of Systems  Constitutional: Negative for activity change, appetite change and fatigue.  HENT: Negative for congestion, sinus pressure and sore throat.   Eyes: Negative for visual disturbance.  Respiratory: Negative for cough, chest tightness, shortness of breath and wheezing.   Cardiovascular: Negative for chest pain and palpitations.  Gastrointestinal: Negative for abdominal distention, abdominal pain and constipation.  Endocrine: Negative for polydipsia.  Genitourinary: Negative for dysuria and frequency.  Musculoskeletal:       See HPI  Skin: Negative for rash.  Neurological: Negative for tremors, light-headedness and numbness.  Hematological: Does not bruise/bleed easily.  Psychiatric/Behavioral: Negative for agitation and behavioral problems.    Objective:  BP (!) 153/83   Pulse 82   Ht '5\' 5"'$  (1.651 m)   Wt 202 lb (91.6 kg)   SpO2 100%   BMI 33.61 kg/m   BP/Weight 05/07/2019 03/04/2019 64/40/3474  Systolic BP 259 563 -  Diastolic BP 83 60 -  Wt. (Lbs) 202 - 205  BMI 33.61 - 34.11      Physical Exam Constitutional:      Appearance: She is well-developed.  Neck:     Vascular: No JVD.  Cardiovascular:     Rate and Rhythm: Normal rate.     Heart sounds: Normal heart sounds. No murmur.  Pulmonary:     Effort: Pulmonary effort is normal.     Breath sounds: Normal breath sounds. No wheezing  or rales.  Chest:     Chest wall: No tenderness.  Abdominal:     General: Bowel sounds are normal. There is no distension.     Palpations: Abdomen is soft. There is no mass.     Tenderness: There is no abdominal tenderness.  Musculoskeletal:        General: Normal range of motion.     Right lower leg: No edema.     Left lower leg: No edema.     Comments: Negative straight leg raise bilaterally  Neurological:  Mental Status: She is alert and oriented to person, place, and time.  Psychiatric:        Mood and Affect: Mood normal.     CMP Latest Ref Rng & Units 02/04/2019 06/17/2018 05/16/2018  Glucose 65 - 99 mg/dL 70 163(H) 357(H)  BUN 6 - 24 mg/dL '12 7 12  '$ Creatinine 0.57 - 1.00 mg/dL 0.93 0.90 1.04(H)  Sodium 134 - 144 mmol/L 141 135 135  Potassium 3.5 - 5.2 mmol/L 4.1 3.8 4.6  Chloride 96 - 106 mmol/L 106 106 98  CO2 20 - 29 mmol/L 23 21(L) 22  Calcium 8.7 - 10.2 mg/dL 9.4 8.8(L) 9.2  Total Protein 6.0 - 8.5 g/dL 7.2 6.7 7.2  Total Bilirubin 0.0 - 1.2 mg/dL <0.2 0.5 0.2  Alkaline Phos 39 - 117 IU/L 74 57 81  AST 0 - 40 IU/L '14 16 16  '$ ALT 0 - 32 IU/L '9 13 13    '$ Lipid Panel     Component Value Date/Time   CHOL 212 (H) 05/16/2018 0950   TRIG 172 (H) 05/16/2018 0950   HDL 45 05/16/2018 0950   CHOLHDL 4.7 (H) 05/16/2018 0950   CHOLHDL 4.7 05/25/2016 1543   VLDL 37 (H) 05/25/2016 1543   LDLCALC 133 (H) 05/16/2018 0950    CBC    Component Value Date/Time   WBC 6.3 06/17/2018 2202   RBC 4.20 06/17/2018 2202   HGB 12.1 06/17/2018 2202   HCT 38.2 06/17/2018 2202   PLT 334 06/17/2018 2202   MCV 91.0 06/17/2018 2202   MCH 28.8 06/17/2018 2202   MCHC 31.7 06/17/2018 2202   RDW 12.9 06/17/2018 2202   LYMPHSABS 2.4 10/05/2017 1628   MONOABS 0.5 10/05/2017 1628   EOSABS 0.1 10/05/2017 1628   BASOSABS 0.0 10/05/2017 1628    Lab Results  Component Value Date   HGBA1C 11.1 (A) 05/07/2019    Assessment & Plan:  1. Type 2 diabetes mellitus with diabetic  polyneuropathy, with long-term current use of insulin (HCC) Uncontrolled with A1c of 11.1; goal is less than 7 Noncompliance with diabetic diet and prescribed regimen of insulin contributory Advised to increase Lantus to 30 units twice daily which was previously prescribed and she has been educated on uptitrating dose by 2 units twice daily until blood sugars are at goal Continue Novolog sliding scale Counseled on Diabetic diet, my plate method, 859 minutes of moderate intensity exercise/week Blood sugar logs with fasting goals of 80-120 mg/dl, random of less than 180 and in the event of sugars less than 60 mg/dl or greater than 400 mg/dl encouraged to notify the clinic. Advised on the need for annual eye exams, annual foot exams, Pneumonia vaccine.  - POCT glucose (manual entry) - POCT glycosylated hemoglobin (Hb A1C) - Lipid panel - gabapentin (NEURONTIN) 300 MG capsule; Take 1 capsule (300 mg total) by mouth 3 (three) times daily.  Dispense: 90 capsule; Refill: 3 - insulin aspart (NOVOLOG) 100 UNIT/ML injection; Inject 0-12 Units into the skin 3 (three) times daily. As perSliding-scale  Dispense: 30 mL; Refill: 3 - liraglutide (VICTOZA) 18 MG/3ML SOPN; Inject 0.3 mLs (1.8 mg total) into the skin daily.  Dispense: 30 mL; Refill: 6 - metFORMIN (GLUCOPHAGE) 1000 MG tablet; Take 1 tablet (1,000 mg total) by mouth 2 (two) times daily with a meal.  Dispense: 180 tablet; Refill: 1  2. Essential hypertension Uncontrolled Increase metoprolol dose from 25 mg twice daily to 50 mg twice daily Discontinue amlodipine as she complained of pedal edema Counseled  on blood pressure goal of less than 130/80, low-sodium, DASH diet, medication compliance, 150 minutes of moderate intensity exercise per week. Discussed medication compliance, adverse effects. - metoprolol tartrate (LOPRESSOR) 50 MG tablet; Take 1 tablet (50 mg total) by mouth 2 (two) times daily.  Dispense: 60 tablet; Refill: 6 -  lisinopril-hydrochlorothiazide (ZESTORETIC) 20-12.5 MG tablet; Take 2 tablets by mouth daily.  Dispense: 180 tablet; Refill: 1  3. Sciatica of right side Demonstrated stretching exercises Gabapentin will be beneficial  4. Hyperlipidemia associated with type 2 diabetes mellitus ([place]) Uncontrolled Continue Zocor Lipid panel at next visit Counseled on low-cholesterol diet - simvastatin (ZOCOR) 40 MG tablet; Take 1 tablet (40 mg total) by mouth at bedtime.  Dispense: 30 tablet; Refill: 3   Return in about 3 months (around 08/07/2019) for Chronic medical conditions-in person.      Charlott Rakes, MD, FAAFP. [facility] and [place] [place], [place]   05/07/2019, 10:08 AM

## 2019-05-08 LAB — LIPID PANEL
Chol/HDL Ratio: 5.9 ratio — ABNORMAL HIGH (ref 0.0–4.4)
Cholesterol, Total: 226 mg/dL — ABNORMAL HIGH (ref 100–199)
HDL: 38 mg/dL — ABNORMAL LOW (ref >39)
LDL Chol Calc (NIH): 153 mg/dL — ABNORMAL HIGH (ref 0–99)
Triglycerides: 190 mg/dL — ABNORMAL HIGH (ref 0–149)
VLDL Cholesterol Cal: 35 mg/dL (ref 5–40)

## 2019-06-10 MED FILL — $LANTUS SOLOSTAR 100 UNITS/: 100 | 22 days supply | Qty: 12 | Fill #2

## 2019-06-26 ENCOUNTER — Encounter (HOSPITAL_COMMUNITY): Payer: Self-pay

## 2019-06-26 ENCOUNTER — Other Ambulatory Visit: Payer: Self-pay

## 2019-06-26 ENCOUNTER — Emergency Department (HOSPITAL_COMMUNITY)
Admission: EM | Admit: 2019-06-26 | Discharge: 2019-06-27 | Disposition: A | Discharge status: Home / Self Care | Payer: No Typology Code available for payment source | Attending: Emergency Medicine | Admitting: Emergency Medicine

## 2019-06-26 DIAGNOSIS — K529 Noninfective gastroenteritis and colitis, unspecified: Secondary | ICD-10-CM | POA: Diagnosis not present

## 2019-06-26 DIAGNOSIS — Z7982 Long term (current) use of aspirin: Secondary | ICD-10-CM | POA: Insufficient documentation

## 2019-06-26 DIAGNOSIS — R1110 Vomiting, unspecified: Secondary | ICD-10-CM | POA: Diagnosis present

## 2019-06-26 DIAGNOSIS — E860 Dehydration: Secondary | ICD-10-CM

## 2019-06-26 DIAGNOSIS — I10 Essential (primary) hypertension: Secondary | ICD-10-CM | POA: Diagnosis not present

## 2019-06-26 DIAGNOSIS — Z87891 Personal history of nicotine dependence: Secondary | ICD-10-CM | POA: Diagnosis not present

## 2019-06-26 DIAGNOSIS — Z794 Long term (current) use of insulin: Secondary | ICD-10-CM | POA: Insufficient documentation

## 2019-06-26 DIAGNOSIS — E119 Type 2 diabetes mellitus without complications: Secondary | ICD-10-CM | POA: Diagnosis not present

## 2019-06-26 LAB — CBC
HCT: 38.3 % (ref 36.0–46.0)
Hemoglobin: 12.3 g/dL (ref 12.0–15.0)
MCH: 29.2 pg (ref 26.0–34.0)
MCHC: 32.1 g/dL (ref 30.0–36.0)
MCV: 91 fL (ref 80.0–100.0)
Platelets: 372 10*3/uL (ref 150–400)
RBC: 4.21 MIL/uL (ref 3.87–5.11)
RDW: 13.8 % (ref 11.5–15.5)
WBC: 5 10*3/uL (ref 4.0–10.5)
nRBC: 0 % (ref 0.0–0.2)

## 2019-06-26 LAB — URINALYSIS, ROUTINE W REFLEX MICROSCOPIC
Bilirubin Urine: NEGATIVE
Glucose, UA: NEGATIVE mg/dL
Ketones, ur: NEGATIVE mg/dL
Leukocytes,Ua: NEGATIVE
Nitrite: NEGATIVE
Protein, ur: 100 mg/dL — AB
RBC / HPF: >50 RBC/hpf — ABNORMAL HIGH (ref 0–5)
Specific Gravity, Urine: 1.031 — ABNORMAL HIGH (ref 1.005–1.030)
pH: 5 (ref 5.0–8.0)

## 2019-06-26 LAB — COMPREHENSIVE METABOLIC PANEL
ALT: 11 U/L (ref 0–44)
AST: 18 U/L (ref 15–41)
Albumin: 3.5 g/dL (ref 3.5–5.0)
Alkaline Phosphatase: 54 U/L (ref 38–126)
Anion gap: 8 (ref 5–15)
BUN: 8 mg/dL (ref 6–20)
CO2: 23 mmol/L (ref 22–32)
Calcium: 9 mg/dL (ref 8.9–10.3)
Chloride: 107 mmol/L (ref 98–111)
Creatinine, Ser: 0.81 mg/dL (ref 0.44–1.00)
GFR calc Af Amer: >60 mL/min (ref >60)
GFR calc non Af Amer: >60 mL/min (ref >60)
Glucose, Bld: 125 mg/dL — ABNORMAL HIGH (ref 70–99)
Potassium: 3.5 mmol/L (ref 3.5–5.1)
Sodium: 138 mmol/L (ref 135–145)
Total Bilirubin: 0.4 mg/dL (ref 0.3–1.2)
Total Protein: 7.1 g/dL (ref 6.5–8.1)

## 2019-06-26 LAB — LIPASE, BLOOD: Lipase: 24 U/L (ref 11–51)

## 2019-06-26 LAB — I-STAT BETA HCG BLOOD, ED (MC, WL, AP ONLY): I-stat hCG, quantitative: <5 m[IU]/mL (ref <5)

## 2019-06-26 MED ORDER — SODIUM CHLORIDE 0.9% FLUSH: 3.0000 mL | Freq: Once | INTRAVENOUS | Status: DC

## 2019-06-26 NOTE — ED Triage Notes (Signed)
Pt reports vomiting since yesterday, denies abd pain, diarrhea or fevers.

## 2019-06-27 DIAGNOSIS — K529 Noninfective gastroenteritis and colitis, unspecified: Secondary | ICD-10-CM | POA: Diagnosis not present

## 2019-06-27 LAB — CBG MONITORING, ED: Glucose-Capillary: 93 mg/dL (ref 70–99)

## 2019-06-27 MED ORDER — SODIUM CHLORIDE 0.9 % IV BOLUS
1000.0000 mL | Freq: Once | INTRAVENOUS | Status: AC
  Administered 2019-06-27: 1000 mL via INTRAVENOUS

## 2019-06-27 MED ORDER — PROMETHAZINE HCL 25 MG PO TABS: 25.0000 mg | ORAL_TABLET | Freq: Four times a day (QID) | ORAL | 0 refills | Status: DC | PRN

## 2019-06-27 NOTE — ED Provider Notes (Signed)
[facility] EMERGENCY DEPARTMENT Provider Note   CSN: [id number] Arrival date & time: 06/26/19  1951     History Chief Complaint  Patient presents with  . Emesis    Mariah Miller is a 44 y.o. female.  HPI Patient presents to the emergency department with vomiting that started yesterday.  The patient states that she did not take any medications prior to arrival for symptoms.  The patient denies any fevers.  She states that she did not have any diarrhea.  The patient states that nothing seems to make her condition better or worse.  The patient denies chest pain, shortness of breath, headache,blurred vision, neck pain, fever, cough, weakness, numbness, dizziness, anorexia, edema, abdominal pain, \diarrhea, rash, back pain, dysuria, hematemesis, bloody stool, near syncope, or syncope.    Past Medical History:  Diagnosis Date  . Depression   . Diabetes mellitus without complication ([place])   . Hypertension   . Migraine   . Seizures ([place])    self reported - last seizure was Oct 2019 "absent seizure"    Patient Active Problem List   Diagnosis Date Noted  . Eczema 05/26/2017  . Primary insomnia 09/28/2016  . Anxiety and depression 05/26/2016  . OSA (obstructive sleep apnea) 04/30/2015  . Chest pain 04/28/2015  . DM2 (diabetes mellitus, type 2) ([place]) 04/28/2015  . ASCUS with positive high risk HPV cervical 06/06/2014  . Anemia, iron deficiency 03/17/2014  . Dyslipidemia 03/17/2014  . Essential hypertension 03/17/2014  . Uncontrolled diabetes mellitus ([place]) 06/17/2013  . Hypoglycemia 04/08/2013  . Diabetic neuropathy, painful ([place]) 03/21/2013  . Other and unspecified hyperlipidemia 01/22/2013  . A C DEGENERATION-CHRONIC 10/27/2008  . SHOULDER PAIN 05/01/2008  . IMPINGEMENT SYNDROME 05/01/2008    Past Surgical History:  Procedure Laterality Date  . CHOLECYSTECTOMY    . CYST EXCISION    . SHOULDER OPEN ROTATOR CUFF REPAIR Left   . TONSILLECTOMY    .  TUBAL LIGATION       OB History    Gravida  6   Para  3   Term  3   Preterm      AB  3   Living  3     SAB  2   TAB      Ectopic      Multiple      Live Births  3           Family History  Problem Relation Age of Onset  . Hypertension Mother   . Thyroid disease Mother   . Irregular heart beat Mother   . Bipolar disorder Sister   . Colon cancer Other   . Esophageal cancer Neg Hx   . Rectal cancer Neg Hx   . Stomach cancer Neg Hx     Social History   Tobacco Use  . Smoking status: Former Smoker    Types: Cigarettes  . Smokeless tobacco: Never Used  . Tobacco comment: Smoked for 4 years, quit age 52  Substance Use Topics  . Alcohol use: Yes    Alcohol/week: 0.0 standard drinks    Comment: Holidays only  . Drug use: No    Home Medications Prior to Admission medications   Medication Sig Start Date End Date Taking? Authorizing Provider  aspirin EC 81 MG tablet Take 1 tablet (81 mg total) by mouth daily. 01/22/13  Yes Dhungel, Nishant, MD  cyclobenzaprine (FLEXERIL) 10 MG tablet Take 1 tablet (10 mg total) by mouth 2 (two) times daily  as needed for muscle spasms. 12/26/18  Yes Domenic Moras, PA-C  ferrous sulfate 325 (65 FE) MG tablet Take 1 tablet (325 mg total) by mouth 2 (two) times daily with a meal. 04/24/17  Yes Newlin, Enobong, MD  gabapentin (NEURONTIN) 300 MG capsule Take 1 capsule (300 mg total) by mouth 3 (three) times daily. 05/07/19  Yes Charlott Rakes, MD  ibuprofen (ADVIL) 600 MG tablet Take 1 tablet (600 mg total) by mouth every 6 (six) hours as needed. 12/26/18  Yes Domenic Moras, PA-C  insulin aspart (NOVOLOG) 100 UNIT/ML injection Inject 0-12 Units into the skin 3 (three) times daily. As perSliding-scale 05/07/19  Yes Charlott Rakes, MD  Insulin Glargine (LANTUS SOLOSTAR) 100 UNIT/ML Solostar Pen Inject 30 Units into the skin 2 (two) times daily. Patient taking differently: Inject 32 Units into the skin 2 (two) times daily.  02/04/19  Yes  Newlin, Enobong, MD  liraglutide (VICTOZA) 18 MG/3ML SOPN Inject 0.3 mLs (1.8 mg total) into the skin daily. 05/07/19  Yes Charlott Rakes, MD  lisinopril-hydrochlorothiazide (ZESTORETIC) 20-12.5 MG tablet Take 2 tablets by mouth daily. 05/07/19  Yes Charlott Rakes, MD  metFORMIN (GLUCOPHAGE) 1000 MG tablet Take 1 tablet (1,000 mg total) by mouth 2 (two) times daily with a meal. 05/07/19  Yes Newlin, Enobong, MD  metoprolol tartrate (LOPRESSOR) 50 MG tablet Take 1 tablet (50 mg total) by mouth 2 (two) times daily. 05/07/19  Yes Charlott Rakes, MD  simvastatin (ZOCOR) 40 MG tablet Take 1 tablet (40 mg total) by mouth at bedtime. 05/07/19  Yes Charlott Rakes, MD  traZODone (DESYREL) 100 MG tablet Take 1 tablet (100 mg total) by mouth at bedtime as needed for sleep. 12/03/18  Yes Charlott Rakes, MD  Blood Glucose Monitoring Suppl (TRUE METRIX METER) w/Device KIT USE AS DIRECTED 05/16/18   Charlott Rakes, MD  Clotrimazole 1 % OINT Apply 1 each topically 2 (two) times daily. Patient not taking: Reported on 06/27/2019 02/04/19   Charlott Rakes, MD  glucose blood (TRUE METRIX BLOOD GLUCOSE TEST) test strip Use as instructed 05/16/18   Charlott Rakes, MD  Insulin Pen Needle (TRUEPLUS 5-BEVEL PEN NEEDLES) 32G X 4 MM MISC Use as instructed to inject insulin and Victoza daily. 03/07/19   Charlott Rakes, MD  metroNIDAZOLE (METROGEL VAGINAL) 0.75 % vaginal gel Place 1 Applicatorful vaginally at bedtime. Patient not taking: Reported on 05/07/2019 02/05/19   Charlott Rakes, MD  sucralfate (CARAFATE) 1 g tablet Take 1 tablet (1 g total) by mouth 4 (four) times daily -  with meals and at bedtime. Patient not taking: Reported on 06/27/2019 06/18/18   Muthersbaugh, Jarrett Soho, PA-C  TRUEplus Lancets 28G MISC USE AS INSTRUCTED 05/16/18   Charlott Rakes, MD    Allergies    Tomato  Review of Systems   Review of Systems All other systems negative except as documented in the HPI. All pertinent positives and negatives as reviewed  in the HPI. Physical Exam Updated Vital Signs BP (!) 159/77   Pulse 85   Temp 98.3 F (36.8 C) (Oral)   Resp 18   LMP 06/25/2019   SpO2 97%   Physical Exam Vitals and nursing note reviewed.  Constitutional:      General: She is not in acute distress.    Appearance: She is well-developed.  HENT:     Head: Normocephalic and atraumatic.  Eyes:     Pupils: Pupils are equal, round, and reactive to light.  Cardiovascular:     Rate and Rhythm: Normal rate and  regular rhythm.     Heart sounds: Normal heart sounds. No murmur. No friction rub. No gallop.   Pulmonary:     Effort: Pulmonary effort is normal. No respiratory distress.     Breath sounds: Normal breath sounds. No wheezing.  Abdominal:     General: Bowel sounds are normal. There is no distension.     Palpations: Abdomen is soft.     Tenderness: There is no abdominal tenderness.  Musculoskeletal:     Cervical back: Normal range of motion and neck supple.  Skin:    General: Skin is warm and dry.     Capillary Refill: Capillary refill takes less than 2 seconds.     Findings: No erythema or rash.  Neurological:     Mental Status: She is alert and oriented to person, place, and time.     Motor: No abnormal muscle tone.     Coordination: Coordination normal.  Psychiatric:        Behavior: Behavior normal.     ED Results / Procedures / Treatments   Labs (all labs ordered are listed, but only abnormal results are displayed) Labs Reviewed  COMPREHENSIVE METABOLIC PANEL - Abnormal; Notable for the following components:      Result Value   Glucose, Bld 125 (*)    All other components within normal limits  URINALYSIS, ROUTINE W REFLEX MICROSCOPIC - Abnormal; Notable for the following components:   Color, Urine AMBER (*)    APPearance HAZY (*)    Specific Gravity, Urine 1.031 (*)    Hgb urine dipstick LARGE (*)    Protein, ur 100 (*)    RBC / HPF >50 (*)    Bacteria, UA RARE (*)    All other components within normal  limits  LIPASE, BLOOD  CBC  I-STAT BETA HCG BLOOD, ED ([facility], [facility], [facility] ONLY)  CBG MONITORING, ED    EKG None  Radiology No results found.  Procedures Procedures (including critical care time)  Medications Ordered in ED Medications  sodium chloride flush (NS) 0.9 % injection 3 mL (3 mLs Intravenous Not Given 06/27/19 0756)  sodium chloride 0.9 % bolus 1,000 mL (0 mLs Intravenous Stopped 06/27/19 1018)    ED Course  I have reviewed the triage vital signs and the nursing notes.  Pertinent labs & imaging results that were available during my care of the patient were reviewed by me and considered in my medical decision making (see chart for details).    MDM Rules/Calculators/A&P                      Patient be treated for gastroenteritis.  The patient is stable and feeling better at this time.  Patient is advised to return here for any worsening in her condition.  All testing was reviewed and no major abnormalities were noted. Final Clinical Impression(s) / ED Diagnoses Final diagnoses:  None    Rx / DC Orders ED Discharge Orders    None       Rebeca Allegra 07/02/19 2335    Lajean Saver, MD 07/04/19 [phone]

## 2019-06-27 NOTE — Discharge Instructions (Signed)
Return here as needed. Follow up with your doctor for a recheck. °

## 2019-06-27 NOTE — ED Notes (Signed)
Pt given crackers, ginger ale and water for PO challenge.

## 2019-08-07 ENCOUNTER — Encounter (HOSPITAL_COMMUNITY): Payer: Self-pay | Admitting: Emergency Medicine

## 2019-08-07 ENCOUNTER — Emergency Department (HOSPITAL_COMMUNITY)
Admission: EM | Admit: 2019-08-07 | Discharge: 2019-08-07 | Disposition: A | Discharge status: Home / Self Care | Payer: No Typology Code available for payment source | Attending: Emergency Medicine | Admitting: Emergency Medicine

## 2019-08-07 DIAGNOSIS — Z87891 Personal history of nicotine dependence: Secondary | ICD-10-CM | POA: Diagnosis not present

## 2019-08-07 DIAGNOSIS — E1140 Type 2 diabetes mellitus with diabetic neuropathy, unspecified: Secondary | ICD-10-CM | POA: Insufficient documentation

## 2019-08-07 DIAGNOSIS — R739 Hyperglycemia, unspecified: Secondary | ICD-10-CM

## 2019-08-07 DIAGNOSIS — Z794 Long term (current) use of insulin: Secondary | ICD-10-CM | POA: Insufficient documentation

## 2019-08-07 DIAGNOSIS — R109 Unspecified abdominal pain: Secondary | ICD-10-CM | POA: Diagnosis present

## 2019-08-07 DIAGNOSIS — E1165 Type 2 diabetes mellitus with hyperglycemia: Secondary | ICD-10-CM | POA: Insufficient documentation

## 2019-08-07 DIAGNOSIS — Z7982 Long term (current) use of aspirin: Secondary | ICD-10-CM | POA: Insufficient documentation

## 2019-08-07 DIAGNOSIS — I10 Essential (primary) hypertension: Secondary | ICD-10-CM | POA: Insufficient documentation

## 2019-08-07 DIAGNOSIS — R1114 Bilious vomiting: Secondary | ICD-10-CM | POA: Diagnosis not present

## 2019-08-07 DIAGNOSIS — E860 Dehydration: Secondary | ICD-10-CM | POA: Diagnosis not present

## 2019-08-07 LAB — URINALYSIS, ROUTINE W REFLEX MICROSCOPIC
Bacteria, UA: NONE SEEN
Bilirubin Urine: NEGATIVE
Glucose, UA: >=500 mg/dL — AB
Hgb urine dipstick: NEGATIVE
Ketones, ur: 5 mg/dL — AB
Leukocytes,Ua: NEGATIVE
Nitrite: NEGATIVE
Protein, ur: 30 mg/dL — AB
Specific Gravity, Urine: 1.032 — ABNORMAL HIGH (ref 1.005–1.030)
pH: 5 (ref 5.0–8.0)

## 2019-08-07 LAB — COMPREHENSIVE METABOLIC PANEL
ALT: 14 U/L (ref 0–44)
AST: 18 U/L (ref 15–41)
Albumin: 3.1 g/dL — ABNORMAL LOW (ref 3.5–5.0)
Alkaline Phosphatase: 53 U/L (ref 38–126)
Anion gap: 11 (ref 5–15)
BUN: 14 mg/dL (ref 6–20)
CO2: 21 mmol/L — ABNORMAL LOW (ref 22–32)
Calcium: 8.8 mg/dL — ABNORMAL LOW (ref 8.9–10.3)
Chloride: 103 mmol/L (ref 98–111)
Creatinine, Ser: 0.9 mg/dL (ref 0.44–1.00)
GFR calc Af Amer: >60 mL/min (ref >60)
GFR calc non Af Amer: >60 mL/min (ref >60)
Glucose, Bld: 204 mg/dL — ABNORMAL HIGH (ref 70–99)
Potassium: 4 mmol/L (ref 3.5–5.1)
Sodium: 135 mmol/L (ref 135–145)
Total Bilirubin: 0.5 mg/dL (ref 0.3–1.2)
Total Protein: 6.7 g/dL (ref 6.5–8.1)

## 2019-08-07 LAB — CBC
HCT: 38.6 % (ref 36.0–46.0)
Hemoglobin: 12.2 g/dL (ref 12.0–15.0)
MCH: 29.3 pg (ref 26.0–34.0)
MCHC: 31.6 g/dL (ref 30.0–36.0)
MCV: 92.8 fL (ref 80.0–100.0)
Platelets: 355 10*3/uL (ref 150–400)
RBC: 4.16 MIL/uL (ref 3.87–5.11)
RDW: 13.9 % (ref 11.5–15.5)
WBC: 7.1 10*3/uL (ref 4.0–10.5)
nRBC: 0 % (ref 0.0–0.2)

## 2019-08-07 LAB — I-STAT BETA HCG BLOOD, ED (MC, WL, AP ONLY): I-stat hCG, quantitative: <5 m[IU]/mL (ref <5)

## 2019-08-07 LAB — LIPASE, BLOOD: Lipase: 32 U/L (ref 11–51)

## 2019-08-07 LAB — CBG MONITORING, ED: Glucose-Capillary: 248 mg/dL — ABNORMAL HIGH (ref 70–99)

## 2019-08-07 MED ORDER — PROMETHAZINE HCL 25 MG/ML IJ SOLN
25.0000 mg | Freq: Once | INTRAMUSCULAR | Status: AC
  Administered 2019-08-07: 25 mg via INTRAVENOUS
  Filled 2019-08-07: qty 1

## 2019-08-07 MED ORDER — SODIUM CHLORIDE 0.9% FLUSH: 3.0000 mL | Freq: Once | INTRAVENOUS | Status: DC

## 2019-08-07 MED ORDER — SODIUM CHLORIDE 0.9 % IV BOLUS
1000.0000 mL | Freq: Once | INTRAVENOUS | Status: AC
  Administered 2019-08-07: 1000 mL via INTRAVENOUS

## 2019-08-07 MED ORDER — FAMOTIDINE IN NACL 20-0.9 MG/50ML-% IV SOLN
20.0000 mg | Freq: Once | INTRAVENOUS | Status: AC
  Administered 2019-08-07: 20 mg via INTRAVENOUS
  Filled 2019-08-07: qty 50

## 2019-08-07 NOTE — Discharge Instructions (Signed)
As discussed, your evaluation today has been largely reassuring.  But, it is important that you monitor your condition carefully, and do not hesitate to return to the ED if you develop new, or concerning changes in your condition. ? ?Otherwise, please follow-up with your physician for appropriate ongoing care. ? ?

## 2019-08-07 NOTE — ED Notes (Signed)
Patient verbalizes understanding of discharge instructions . Opportunity for questions and answers were provided . Armband removed by staff ,Pt discharged from ED. W/C  offered at D/C  and Declined W/C at D/C and was escorted to lobby by RN.  

## 2019-08-07 NOTE — ED Triage Notes (Signed)
Patient in POV, reports LLQ abdominal pain, N/V since Monday. States she has been unable to tolerate food/liquids since then. Patient is also diabetic and reports her sugars have been running highh

## 2019-08-07 NOTE — ED Provider Notes (Signed)
[facility] EMERGENCY DEPARTMENT Provider Note   CSN: [id number] Arrival date & time: 08/07/19  4888     History Chief Complaint  Patient presents with   Emesis    Mariah Miller is a 44 y.o. female.  HPI    Patient with a history of insulin-dependent diabetes presents with concern of abdominal pain, nausea, vomiting.  Pain is sore, severe. Onset was about 2 days ago.  Since that time patient has had pain focally in the left lateral abdomen.  There is associated anorexia, nausea, and she has had minimal oral intake over the past day or so. She has been taking her long-acting insulin, but not the short acting 1. No fever, no diarrhea, no syncope, no other pain. No clear alleviating or exacerbating factors.  She notes that this is a typical constellation of symptoms that she experiences during episodes of hyperglycemia.  Last ED visit was about 3 months ago.  Past Medical History:  Diagnosis Date   Depression    Diabetes mellitus without complication ([place])    Hypertension    Migraine    Seizures ([place])    self reported - last seizure was Oct 2019 "absent seizure"    Patient Active Problem List   Diagnosis Date Noted   Eczema 05/26/2017   Primary insomnia 09/28/2016   Anxiety and depression 05/26/2016   OSA (obstructive sleep apnea) 04/30/2015   Chest pain 04/28/2015   DM2 (diabetes mellitus, type 2) ([place]) 04/28/2015   ASCUS with positive high risk HPV cervical 06/06/2014   Anemia, iron deficiency 03/17/2014   Dyslipidemia 03/17/2014   Essential hypertension 03/17/2014   Uncontrolled diabetes mellitus ([place]) 06/17/2013   Hypoglycemia 04/08/2013   Diabetic neuropathy, painful ([place]) 03/21/2013   Other and unspecified hyperlipidemia 01/22/2013   A C DEGENERATION-CHRONIC 10/27/2008   SHOULDER PAIN 05/01/2008   IMPINGEMENT SYNDROME 05/01/2008    Past Surgical History:  Procedure Laterality Date   CHOLECYSTECTOMY     CYST  EXCISION     SHOULDER OPEN ROTATOR CUFF REPAIR Left    TONSILLECTOMY     TUBAL LIGATION       OB History    Gravida  6   Para  3   Term  3   Preterm      AB  3   Living  3     SAB  2   TAB      Ectopic      Multiple      Live Births  3           Family History  Problem Relation Age of Onset   Hypertension Mother    Thyroid disease Mother    Irregular heart beat Mother    Bipolar disorder Sister    Colon cancer Other    Esophageal cancer Neg Hx    Rectal cancer Neg Hx    Stomach cancer Neg Hx     Social History   Tobacco Use   Smoking status: Former Smoker    Types: Cigarettes   Smokeless tobacco: Never Used   Tobacco comment: Smoked for 4 years, quit age 37  Substance Use Topics   Alcohol use: Yes    Alcohol/week: 0.0 standard drinks    Comment: Holidays only   Drug use: No    Home Medications Prior to Admission medications   Medication Sig Start Date End Date Taking? Authorizing Provider  aspirin EC 81 MG tablet Take 1 tablet (81 mg total) by mouth daily.  01/22/13  Yes Dhungel, Nishant, MD  Blood Glucose Monitoring Suppl (TRUE METRIX METER) w/Device KIT USE AS DIRECTED Patient taking differently: 1 each by Other route as directed.  05/16/18  Yes Charlott Rakes, MD  cyclobenzaprine (FLEXERIL) 10 MG tablet Take 1 tablet (10 mg total) by mouth 2 (two) times daily as needed for muscle spasms. 12/26/18  Yes Domenic Moras, PA-C  ferrous sulfate 325 (65 FE) MG tablet Take 1 tablet (325 mg total) by mouth 2 (two) times daily with a meal. 04/24/17  Yes Newlin, Enobong, MD  gabapentin (NEURONTIN) 300 MG capsule Take 1 capsule (300 mg total) by mouth 3 (three) times daily. 05/07/19  Yes Newlin, Charlane Ferretti, MD  glucose blood (TRUE METRIX BLOOD GLUCOSE TEST) test strip Use as instructed Patient taking differently: 1 each by Other route as directed.  05/16/18  Yes Newlin, Charlane Ferretti, MD  insulin aspart (NOVOLOG) 100 UNIT/ML injection Inject 0-12 Units  into the skin 3 (three) times daily. As perSliding-scale Patient taking differently: Inject 0-12 Units into the skin 3 (three) times daily. As per Sliding-Scale 05/07/19  Yes Newlin, Enobong, MD  Insulin Glargine (LANTUS SOLOSTAR) 100 UNIT/ML Solostar Pen Inject 30 Units into the skin 2 (two) times daily. Patient taking differently: Inject 32 Units into the skin 2 (two) times daily.  02/04/19  Yes Newlin, Charlane Ferretti, MD  Insulin Pen Needle (TRUEPLUS 5-BEVEL PEN NEEDLES) 32G X 4 MM MISC Use as instructed to inject insulin and Victoza daily. Patient taking differently: 1 each by Other route See admin instructions. Use as instructed to inject insulin and Victoza daily. 03/07/19  Yes Newlin, Enobong, MD  liraglutide (VICTOZA) 18 MG/3ML SOPN Inject 0.3 mLs (1.8 mg total) into the skin daily. 05/07/19  Yes Charlott Rakes, MD  lisinopril-hydrochlorothiazide (ZESTORETIC) 20-12.5 MG tablet Take 2 tablets by mouth daily. 05/07/19  Yes Charlott Rakes, MD  metFORMIN (GLUCOPHAGE) 1000 MG tablet Take 1 tablet (1,000 mg total) by mouth 2 (two) times daily with a meal. 05/07/19  Yes Newlin, Enobong, MD  metoprolol tartrate (LOPRESSOR) 50 MG tablet Take 1 tablet (50 mg total) by mouth 2 (two) times daily. 05/07/19  Yes Charlott Rakes, MD  simvastatin (ZOCOR) 40 MG tablet Take 1 tablet (40 mg total) by mouth at bedtime. 05/07/19  Yes Charlott Rakes, MD  traZODone (DESYREL) 100 MG tablet Take 1 tablet (100 mg total) by mouth at bedtime as needed for sleep. 12/03/18  Yes Charlott Rakes, MD  TRUEplus Lancets 28G MISC USE AS INSTRUCTED Patient taking differently: 1 each by Other route as directed.  05/16/18  Yes Charlott Rakes, MD  Clotrimazole 1 % OINT Apply 1 each topically 2 (two) times daily. Patient not taking: Reported on 06/27/2019 02/04/19   Charlott Rakes, MD  ibuprofen (ADVIL) 600 MG tablet Take 1 tablet (600 mg total) by mouth every 6 (six) hours as needed. Patient not taking: Reported on 08/07/2019 12/26/18   Domenic Moras,  PA-C  metroNIDAZOLE (METROGEL VAGINAL) 0.75 % vaginal gel Place 1 Applicatorful vaginally at bedtime. Patient not taking: Reported on 05/07/2019 02/05/19   Charlott Rakes, MD  promethazine (PHENERGAN) 25 MG tablet Take 1 tablet (25 mg total) by mouth every 6 (six) hours as needed for nausea or vomiting. Patient not taking: Reported on 08/07/2019 06/27/19   Dalia Heading, PA-C  sucralfate (CARAFATE) 1 g tablet Take 1 tablet (1 g total) by mouth 4 (four) times daily -  with meals and at bedtime. Patient not taking: Reported on 06/27/2019 06/18/18   Muthersbaugh, Jarrett Soho, PA-C  Allergies    Tomato  Review of Systems   Review of Systems  Constitutional:       Per HPI, otherwise negative  HENT:       Per HPI, otherwise negative  Respiratory:       Per HPI, otherwise negative  Cardiovascular:       Per HPI, otherwise negative  Gastrointestinal: Positive for abdominal pain, nausea and vomiting.  Endocrine:       Negative aside from HPI  Genitourinary:       Neg aside from HPI   Musculoskeletal:       Per HPI, otherwise negative  Skin: Negative.   Neurological: Negative for syncope.    Physical Exam Updated Vital Signs BP (!) 156/78 (BP Location: Left Arm)    Pulse 95    Temp 98.7 F (37.1 C) (Oral)    Resp 18    Ht '5\' 5"'$  (1.651 m)    Wt 93 kg    SpO2 100%    BMI 34.11 kg/m   Physical Exam Vitals and nursing note reviewed.  Constitutional:      General: She is not in acute distress.    Appearance: She is well-developed.  HENT:     Head: Normocephalic and atraumatic.  Eyes:     Conjunctiva/sclera: Conjunctivae normal.  Cardiovascular:     Rate and Rhythm: Normal rate and regular rhythm.  Pulmonary:     Effort: Pulmonary effort is normal. No respiratory distress.     Breath sounds: Normal breath sounds. No stridor.  Abdominal:     General: There is no distension.     Comments: Minimal tenderness palpation left lateral superior abdomen, no guarding, no rebound, no  peritoneal findings.  Skin:    General: Skin is warm and dry.  Neurological:     Mental Status: She is alert and oriented to person, place, and time.     Cranial Nerves: No cranial nerve deficit.     ED Results / Procedures / Treatments   Labs (all labs ordered are listed, but only abnormal results are displayed) Labs Reviewed  COMPREHENSIVE METABOLIC PANEL - Abnormal; Notable for the following components:      Result Value   CO2 21 (*)    Glucose, Bld 204 (*)    Calcium 8.8 (*)    Albumin 3.1 (*)    All other components within normal limits  URINALYSIS, ROUTINE W REFLEX MICROSCOPIC - Abnormal; Notable for the following components:   Specific Gravity, Urine 1.032 (*)    Glucose, UA >=500 (*)    Ketones, ur 5 (*)    Protein, ur 30 (*)    All other components within normal limits  CBG MONITORING, ED - Abnormal; Notable for the following components:   Glucose-Capillary 248 (*)    All other components within normal limits  LIPASE, BLOOD  CBC  I-STAT BETA HCG BLOOD, ED ([facility], [facility], [facility] ONLY)   Procedures Procedures (including critical care time)  Medications Ordered in ED Medications  sodium chloride flush (NS) 0.9 % injection 3 mL (has no administration in time range)  sodium chloride 0.9 % bolus 1,000 mL (1,000 mLs Intravenous New Bag/Given 08/07/19 0911)  promethazine (PHENERGAN) injection 25 mg (25 mg Intravenous Given 08/07/19 0915)  famotidine (PEPCID) IVPB 20 mg premix (0 mg Intravenous Stopped 08/07/19 1029)    ED Course  I have reviewed the triage vital signs and the nursing notes.  Pertinent labs & imaging results that were available during my care  of the patient were reviewed by me and considered in my medical decision making (see chart for details).    MDM Rules/Calculators/A&P                      11:50 AM Patient awake, alert, speaking clearly, on cellular telephone and then with me in the room. She has received fluids, Pepcid, notes that she feels substantially  better. Labs notable for evidence for hyperglycemia, dehydration, but no evidence for DKA.  Given her substantial improvement here, there is suspicion for hyperglycemia associated nausea, vomiting, but with a soft, nonperitoneal abdomen, and her history of similar prior episodes, there is some suspicion for recurrence of episodic abdominal pain likely due to her diabetes, gastritis.  No evidence for acute abdomen, no indication for advanced imaging.  Given her substantial improvement here she was discharged in stable condition with close outpatient follow-up. Final Clinical Impression(s) / ED Diagnoses Final diagnoses:  Bilious vomiting with nausea  Dehydration  Hyperglycemia     Carmin Muskrat, MD 08/07/19 1151

## 2019-08-12 ENCOUNTER — Ambulatory Visit: Payer: No Typology Code available for payment source | Admitting: Family Medicine

## 2019-08-20 ENCOUNTER — Other Ambulatory Visit: Payer: Self-pay

## 2019-08-20 ENCOUNTER — Encounter: Payer: Self-pay | Admitting: Family Medicine

## 2019-08-20 ENCOUNTER — Ambulatory Visit: Payer: No Typology Code available for payment source | Attending: Family Medicine | Admitting: Family Medicine

## 2019-08-20 VITALS — BP 117/70 | HR 90 | Ht 65.0 in | Wt 221.0 lb

## 2019-08-20 DIAGNOSIS — F5101 Primary insomnia: Secondary | ICD-10-CM

## 2019-08-20 DIAGNOSIS — E1142 Type 2 diabetes mellitus with diabetic polyneuropathy: Secondary | ICD-10-CM

## 2019-08-20 DIAGNOSIS — Z794 Long term (current) use of insulin: Secondary | ICD-10-CM | POA: Diagnosis not present

## 2019-08-20 DIAGNOSIS — E1165 Type 2 diabetes mellitus with hyperglycemia: Secondary | ICD-10-CM

## 2019-08-20 DIAGNOSIS — E785 Hyperlipidemia, unspecified: Secondary | ICD-10-CM

## 2019-08-20 DIAGNOSIS — E1169 Type 2 diabetes mellitus with other specified complication: Secondary | ICD-10-CM

## 2019-08-20 DIAGNOSIS — I10 Essential (primary) hypertension: Secondary | ICD-10-CM | POA: Diagnosis not present

## 2019-08-20 DIAGNOSIS — R140 Abdominal distension (gaseous): Secondary | ICD-10-CM

## 2019-08-20 LAB — POCT GLYCOSYLATED HEMOGLOBIN (HGB A1C): HbA1c, POC (controlled diabetic range): 8.6 % — AB (ref 0.0–7.0)

## 2019-08-20 LAB — GLUCOSE, POCT (MANUAL RESULT ENTRY)
POC Glucose: 65 mg/dl — AB (ref 70–99)
POC Glucose: 88 mg/dl (ref 70–99)

## 2019-08-20 MED ORDER — INSULIN ASPART 100 UNIT/ML ~~LOC~~ SOLN: 0.0000 [IU] | Freq: Three times a day (TID) | SUBCUTANEOUS | 3 refills | Status: DC

## 2019-08-20 MED ORDER — METOPROLOL TARTRATE 50 MG PO TABS: 50.0000 mg | ORAL_TABLET | Freq: Two times a day (BID) | ORAL | 6 refills | Status: DC

## 2019-08-20 MED ORDER — GABAPENTIN 300 MG PO CAPS: 600.0000 mg | ORAL_CAPSULE | Freq: Three times a day (TID) | ORAL | 3 refills | Status: DC

## 2019-08-20 MED ORDER — TRAZODONE HCL 100 MG PO TABS: 100.0000 mg | ORAL_TABLET | Freq: Every evening | ORAL | 3 refills | Status: DC | PRN

## 2019-08-20 MED ORDER — LANTUS SOLOSTAR 100 UNIT/ML ~~LOC~~ SOPN: 34.0000 [IU] | PEN_INJECTOR | Freq: Two times a day (BID) | SUBCUTANEOUS | 5 refills | Status: DC

## 2019-08-20 MED ORDER — SIMVASTATIN 40 MG PO TABS: 40.0000 mg | ORAL_TABLET | Freq: Every day | ORAL | 3 refills | Status: DC

## 2019-08-20 MED ORDER — METFORMIN HCL 1000 MG PO TABS: 1000.0000 mg | ORAL_TABLET | Freq: Two times a day (BID) | ORAL | 1 refills | Status: DC

## 2019-08-20 MED ORDER — VICTOZA 18 MG/3ML ~~LOC~~ SOPN: 1.8000 mg | PEN_INJECTOR | Freq: Every day | SUBCUTANEOUS | 6 refills | Status: DC

## 2019-08-20 MED ORDER — LISINOPRIL-HYDROCHLOROTHIAZIDE 20-12.5 MG PO TABS: 2.0000 | ORAL_TABLET | Freq: Every day | ORAL | 1 refills | Status: DC

## 2019-08-20 MED FILL — GABAPENTIN 300 MG CAPSULE: 300 | 20 days supply | Qty: 120 | Fill #0

## 2019-08-20 MED FILL — $VICTOZA 2-PAK 18MG/3ML PEN: 18 | 30 days supply | Qty: 9 | Fill #0

## 2019-08-20 MED FILL — NovoLOG 100 UNIT/ML SOLN: 100 | 27 days supply | Qty: 10 | Fill #0

## 2019-08-20 MED FILL — !LANTUS SOLOSTAR 100UNITS/M: 100 | 17 days supply | Qty: 12 | Fill #0

## 2019-08-20 MED FILL — TRAZODONE HCL 100 MG TABS: 100 | 30 days supply | Qty: 30 | Fill #0

## 2019-08-20 MED FILL — LISINOPRIL-HCTZ 20-12.5 MG: 20-12.5 | 30 days supply | Qty: 60 | Fill #0

## 2019-08-20 MED FILL — METOPROLOL TARTRATE 50 MG T: 50 | 30 days supply | Qty: 60 | Fill #0

## 2019-08-20 MED FILL — metFORMIN HCL 1000 MG TABS: 1000 | 30 days supply | Qty: 60 | Fill #0

## 2019-08-20 MED FILL — SIMVASTATIN 40 MG TABLET: 40 | 30 days supply | Qty: 30 | Fill #0

## 2019-08-20 NOTE — Progress Notes (Signed)
Subjective:  Patient ID: Mariah Miller, female    DOB: 1975-12-03  Age: 44 y.o. MRN: [id number]  CC: Diabetes   HPI Mariah Miller is a 44 year old female with a history of type 2 diabetes mellitus (A1c9.7), hypertension, depression who presents today for follow-up visit. Her A1c is 8.6 which is down from 9.7 previously. Random sugars are around 139.  Her neuropathy is uncontrolled on her current dose of Gabapentin. She is due for an eye exam; promises to call her Ophthalmologist to schedule.  She complains of burping and passing a lot of gas; H.pylori in 08/2018 was negative. She has no Diarrhea, nausea or vomiting. Had upper endoscopy in 12/2018 which revealed: - Z-line irregular. Biopsied. - Gastritis. Biopsied. - A small hiatal hernia. - Non-bleeding duodenal ulcer with no stigmata of bleeding. Biopsied. - The examination was otherwise normal.  Her Depression is stable and she is compliant with her antihypertensive; also tolerating her Statin. Past Medical History:  Diagnosis Date   Depression    Diabetes mellitus without complication ([place])    Hypertension    Migraine    Seizures ([place])    self reported - last seizure was Oct 2019 "absent seizure"    Past Surgical History:  Procedure Laterality Date   CHOLECYSTECTOMY     CYST EXCISION     SHOULDER OPEN ROTATOR CUFF REPAIR Left    TONSILLECTOMY     TUBAL LIGATION      Family History  Problem Relation Age of Onset   Hypertension Mother    Thyroid disease Mother    Irregular heart beat Mother    Bipolar disorder Sister    Colon cancer Other    Esophageal cancer Neg Hx    Rectal cancer Neg Hx    Stomach cancer Neg Hx     Allergies  Allergen Reactions   Tomato Itching and Rash    Outpatient Medications Prior to Visit  Medication Sig Dispense Refill   aspirin EC 81 MG tablet Take 1 tablet (81 mg total) by mouth daily. 30 tablet 5   Blood Glucose Monitoring Suppl (TRUE METRIX  METER) w/Device KIT USE AS DIRECTED (Patient taking differently: 1 each by Other route as directed. ) 1 kit 0   cyclobenzaprine (FLEXERIL) 10 MG tablet Take 1 tablet (10 mg total) by mouth 2 (two) times daily as needed for muscle spasms. 20 tablet 0   ferrous sulfate 325 (65 FE) MG tablet Take 1 tablet (325 mg total) by mouth 2 (two) times daily with a meal. 180 tablet 3   glucose blood (TRUE METRIX BLOOD GLUCOSE TEST) test strip Use as instructed (Patient taking differently: 1 each by Other route as directed. ) 100 each 12   Insulin Pen Needle (TRUEPLUS 5-BEVEL PEN NEEDLES) 32G X 4 MM MISC Use as instructed to inject insulin and Victoza daily. (Patient taking differently: 1 each by Other route See admin instructions. Use as instructed to inject insulin and Victoza daily.) 100 each 11   TRUEplus Lancets 28G MISC USE AS INSTRUCTED (Patient taking differently: 1 each by Other route as directed. ) 100 each 5   gabapentin (NEURONTIN) 300 MG capsule Take 1 capsule (300 mg total) by mouth 3 (three) times daily. 90 capsule 3   insulin aspart (NOVOLOG) 100 UNIT/ML injection Inject 0-12 Units into the skin 3 (three) times daily. As perSliding-scale (Patient taking differently: Inject 0-12 Units into the skin 3 (three) times daily. As per Sliding-Scale) 30 mL 3   Insulin Glargine (  LANTUS SOLOSTAR) 100 UNIT/ML Solostar Pen Inject 30 Units into the skin 2 (two) times daily. (Patient taking differently: Inject 32 Units into the skin 2 (two) times daily. ) 30 mL 5   liraglutide (VICTOZA) 18 MG/3ML SOPN Inject 0.3 mLs (1.8 mg total) into the skin daily. 30 mL 6   lisinopril-hydrochlorothiazide (ZESTORETIC) 20-12.5 MG tablet Take 2 tablets by mouth daily. 180 tablet 1   metFORMIN (GLUCOPHAGE) 1000 MG tablet Take 1 tablet (1,000 mg total) by mouth 2 (two) times daily with a meal. 180 tablet 1   metoprolol tartrate (LOPRESSOR) 50 MG tablet Take 1 tablet (50 mg total) by mouth 2 (two) times daily. 60 tablet 6     simvastatin (ZOCOR) 40 MG tablet Take 1 tablet (40 mg total) by mouth at bedtime. 30 tablet 3   traZODone (DESYREL) 100 MG tablet Take 1 tablet (100 mg total) by mouth at bedtime as needed for sleep. 30 tablet 3   Clotrimazole 1 % OINT Apply 1 each topically 2 (two) times daily. (Patient not taking: Reported on 06/27/2019) 56.7 g 0   ibuprofen (ADVIL) 600 MG tablet Take 1 tablet (600 mg total) by mouth every 6 (six) hours as needed. (Patient not taking: Reported on 08/07/2019) 30 tablet 0   metroNIDAZOLE (METROGEL VAGINAL) 0.75 % vaginal gel Place 1 Applicatorful vaginally at bedtime. (Patient not taking: Reported on 05/07/2019) 70 g 0   promethazine (PHENERGAN) 25 MG tablet Take 1 tablet (25 mg total) by mouth every 6 (six) hours as needed for nausea or vomiting. (Patient not taking: Reported on 08/07/2019) 10 tablet 0   sucralfate (CARAFATE) 1 g tablet Take 1 tablet (1 g total) by mouth 4 (four) times daily -  with meals and at bedtime. (Patient not taking: Reported on 06/27/2019) 60 tablet 0   No facility-administered medications prior to visit.     ROS Review of Systems  Constitutional: Negative for activity change, appetite change and fatigue.  HENT: Negative for congestion, sinus pressure and sore throat.   Eyes: Negative for visual disturbance.  Respiratory: Negative for cough, chest tightness, shortness of breath and wheezing.   Cardiovascular: Negative for chest pain and palpitations.  Gastrointestinal: Negative for abdominal distention, abdominal pain and constipation.  Endocrine: Negative for polydipsia.  Genitourinary: Negative for dysuria and frequency.  Musculoskeletal: Negative for arthralgias and back pain.  Skin: Negative for rash.  Neurological: Negative for tremors, light-headedness and numbness.  Hematological: Does not bruise/bleed easily.  Psychiatric/Behavioral: Negative for agitation and behavioral problems.    Objective:  BP 117/70    Pulse 90    Ht '5\' 5"'$   (1.651 m)    Wt 221 lb (100.2 kg)    SpO2 100%    BMI 36.78 kg/m   BP/Weight 08/20/2019 08/07/2019 7/32/2025  Systolic BP 427 062 376  Diastolic BP 70 70 77  Wt. (Lbs) 221 205 -  BMI 36.78 34.11 -      Physical Exam Constitutional:      Appearance: She is well-developed.  Neck:     Vascular: No JVD.  Cardiovascular:     Rate and Rhythm: Normal rate.     Heart sounds: Normal heart sounds. No murmur heard.   Pulmonary:     Effort: Pulmonary effort is normal.     Breath sounds: Normal breath sounds. No wheezing or rales.  Chest:     Chest wall: No tenderness.  Abdominal:     General: Bowel sounds are normal. There is no distension.  Palpations: Abdomen is soft. There is no mass.     Tenderness: There is no abdominal tenderness.  Musculoskeletal:        General: Normal range of motion.     Right lower leg: No edema.     Left lower leg: No edema.  Neurological:     Mental Status: She is alert and oriented to person, place, and time.  Psychiatric:        Mood and Affect: Mood normal.     CMP Latest Ref Rng & Units 08/07/2019 06/26/2019 02/04/2019  Glucose 70 - 99 mg/dL 204(H) 125(H) 70  BUN 6 - 20 mg/dL '14 8 12  '$ Creatinine 0.44 - 1.00 mg/dL 0.90 0.81 0.93  Sodium 135 - 145 mmol/L 135 138 141  Potassium 3.5 - 5.1 mmol/L 4.0 3.5 4.1  Chloride 98 - 111 mmol/L 103 107 106  CO2 22 - 32 mmol/L 21(L) 23 23  Calcium 8.9 - 10.3 mg/dL 8.8(L) 9.0 9.4  Total Protein 6.5 - 8.1 g/dL 6.7 7.1 7.2  Total Bilirubin 0.3 - 1.2 mg/dL 0.5 0.4 <0.2  Alkaline Phos 38 - 126 U/L 53 54 74  AST 15 - 41 U/L '18 18 14  '$ ALT 0 - 44 U/L '14 11 9    '$ Lipid Panel     Component Value Date/Time   CHOL 226 (H) 05/07/2019 1043   TRIG 190 (H) 05/07/2019 1043   HDL 38 (L) 05/07/2019 1043   CHOLHDL 5.9 (H) 05/07/2019 1043   CHOLHDL 4.7 05/25/2016 1543   VLDL 37 (H) 05/25/2016 1543   LDLCALC 153 (H) 05/07/2019 1043    CBC    Component Value Date/Time   WBC 7.1 08/07/2019 0705   RBC 4.16 08/07/2019  0705   HGB 12.2 08/07/2019 0705   HCT 38.6 08/07/2019 0705   PLT 355 08/07/2019 0705   MCV 92.8 08/07/2019 0705   MCH 29.3 08/07/2019 0705   MCHC 31.6 08/07/2019 0705   RDW 13.9 08/07/2019 0705   LYMPHSABS 2.4 10/05/2017 1628   MONOABS 0.5 10/05/2017 1628   EOSABS 0.1 10/05/2017 1628   BASOSABS 0.0 10/05/2017 1628    Lab Results  Component Value Date   HGBA1C 8.6 (A) 08/20/2019     Assessment & Plan:  1. Type 2 diabetes mellitus with diabetic polyneuropathy, with long-term current use of insulin (HCC) Uncontrolled with A1c of 8.6; goal is <7.0 Increase Lantus to 34 units bid; continue Novolog Counseled on Diabetic diet, my plate method, 053 minutes of moderate intensity exercise/week Blood sugar logs with fasting goals of 80-120 mg/dl, random of less than 180 and in the event of sugars less than 60 mg/dl or greater than 400 mg/dl encouraged to notify the clinic. Advised on the need for annual eye exams, annual foot exams, Pneumonia vaccine. Advised to schedule eye exam Uncontrolled neuropathy-increase gabapentin dose - POCT glucose (manual entry) - POCT glycosylated hemoglobin (Hb A1C) - gabapentin (NEURONTIN) 300 MG capsule; Take 2 capsules (600 mg total) by mouth 3 (three) times daily.  Dispense: 120 capsule; Refill: 3 - insulin aspart (NOVOLOG) 100 UNIT/ML injection; Inject 0-12 Units into the skin 3 (three) times daily. As per Sliding-Scale  Dispense: 30 mL; Refill: 3 - liraglutide (VICTOZA) 18 MG/3ML SOPN; Inject 0.3 mLs (1.8 mg total) into the skin daily.  Dispense: 30 mL; Refill: 6 - metFORMIN (GLUCOPHAGE) 1000 MG tablet; Take 1 tablet (1,000 mg total) by mouth 2 (two) times daily with a meal.  Dispense: 180 tablet; Refill: 1 - Lipid Panel -  POCT glucose (manual entry)  2. Uncontrolled type 2 diabetes mellitus with hyperglycemia, with long-term current use of insulin (HCC) See #1 above - insulin glargine (LANTUS SOLOSTAR) 100 UNIT/ML Solostar Pen; Inject 34 Units into  the skin 2 (two) times daily.  Dispense: 30 mL; Refill: 5  3. Essential hypertension Controlled Counseled on blood pressure goal of less than 130/80, low-sodium, DASH diet, medication compliance, 150 minutes of moderate intensity exercise per week. Discussed medication compliance, adverse effects. - lisinopril-hydrochlorothiazide (ZESTORETIC) 20-12.5 MG tablet; Take 2 tablets by mouth daily.  Dispense: 180 tablet; Refill: 1 - metoprolol tartrate (LOPRESSOR) 50 MG tablet; Take 1 tablet (50 mg total) by mouth 2 (two) times daily.  Dispense: 60 tablet; Refill: 6  4. Hyperlipidemia associated with type 2 diabetes mellitus (HCC) Uncontrolled We will send of lipid panel and adjust regimen accordingly - simvastatin (ZOCOR) 40 MG tablet; Take 1 tablet (40 mg total) by mouth at bedtime.  Dispense: 30 tablet; Refill: 3  5. Primary insomnia Uncontrolled Increasing gabapentin dose should hopefully help with this - traZODone (DESYREL) 100 MG tablet; Take 1 tablet (100 mg total) by mouth at bedtime as needed for sleep.  Dispense: 30 tablet; Refill: 3  6. Abdominal bloating She could be experiencing symptoms of gastroparesis We will need to exclude H. pylori first If symptoms persist and H. pylori is negative consider discontinuing Victoza and increasing Lantus dose as gastroparesis symptoms is likely worsened by GLP-1 - H. pylori breath test    Meds ordered this encounter  Medications   insulin glargine (LANTUS SOLOSTAR) 100 UNIT/ML Solostar Pen    Sig: Inject 34 Units into the skin 2 (two) times daily.    Dispense:  30 mL    Refill:  5    Discontinue previous dose   gabapentin (NEURONTIN) 300 MG capsule    Sig: Take 2 capsules (600 mg total) by mouth 3 (three) times daily.    Dispense:  120 capsule    Refill:  3   insulin aspart (NOVOLOG) 100 UNIT/ML injection    Sig: Inject 0-12 Units into the skin 3 (three) times daily. As per Sliding-Scale    Dispense:  30 mL    Refill:  3    liraglutide (VICTOZA) 18 MG/3ML SOPN    Sig: Inject 0.3 mLs (1.8 mg total) into the skin daily.    Dispense:  30 mL    Refill:  6   lisinopril-hydrochlorothiazide (ZESTORETIC) 20-12.5 MG tablet    Sig: Take 2 tablets by mouth daily.    Dispense:  180 tablet    Refill:  1   metFORMIN (GLUCOPHAGE) 1000 MG tablet    Sig: Take 1 tablet (1,000 mg total) by mouth 2 (two) times daily with a meal.    Dispense:  180 tablet    Refill:  1   metoprolol tartrate (LOPRESSOR) 50 MG tablet    Sig: Take 1 tablet (50 mg total) by mouth 2 (two) times daily.    Dispense:  60 tablet    Refill:  6    Discontinue amlodipine   simvastatin (ZOCOR) 40 MG tablet    Sig: Take 1 tablet (40 mg total) by mouth at bedtime.    Dispense:  30 tablet    Refill:  3    Dose change   traZODone (DESYREL) 100 MG tablet    Sig: Take 1 tablet (100 mg total) by mouth at bedtime as needed for sleep.    Dispense:  30 tablet    Refill:  3    Discontinue previous dose    Follow-up: Return in about 3 months (around 11/20/2019) for Chronic disease management.       Charlott Rakes, MD, FAAFP. [facility] [place], [place]   08/20/2019, 12:57 PM

## 2019-08-20 NOTE — Patient Instructions (Signed)

## 2019-08-20 NOTE — Progress Notes (Signed)
Having ome swelling in her legs.  States that she feel bloated.

## 2019-08-20 NOTE — Progress Notes (Deleted)
Subjective:  Patient ID: Mariah Miller, female    DOB: 1975/10/29  Age: 44 y.o. MRN: [id number]  CC: Diabetes   HPI Mariah Miller is a 44 year old female with a history of type 2 diabetes mellitus (A1c9.7), hypertension, depression who presents today for follow-up visit.  Random sugars are 139.  Has neuropathy She complains of burping and passing a lot of gas; had endoscopy in 12/2018 Past Medical History:  Diagnosis Date  . Depression   . Diabetes mellitus without complication ([place])   . Hypertension   . Migraine   . Seizures ([place])    self reported - last seizure was Oct 2019 "absent seizure"    Past Surgical History:  Procedure Laterality Date  . CHOLECYSTECTOMY    . CYST EXCISION    . SHOULDER OPEN ROTATOR CUFF REPAIR Left   . TONSILLECTOMY    . TUBAL LIGATION      Family History  Problem Relation Age of Onset  . Hypertension Mother   . Thyroid disease Mother   . Irregular heart beat Mother   . Bipolar disorder Sister   . Colon cancer Other   . Esophageal cancer Neg Hx   . Rectal cancer Neg Hx   . Stomach cancer Neg Hx     Allergies  Allergen Reactions  . Tomato Itching and Rash    Outpatient Medications Prior to Visit  Medication Sig Dispense Refill  . aspirin EC 81 MG tablet Take 1 tablet (81 mg total) by mouth daily. 30 tablet 5  . Blood Glucose Monitoring Suppl (TRUE METRIX METER) w/Device KIT USE AS DIRECTED (Patient taking differently: 1 each by Other route as directed. ) 1 kit 0  . cyclobenzaprine (FLEXERIL) 10 MG tablet Take 1 tablet (10 mg total) by mouth 2 (two) times daily as needed for muscle spasms. 20 tablet 0  . ferrous sulfate 325 (65 FE) MG tablet Take 1 tablet (325 mg total) by mouth 2 (two) times daily with a meal. 180 tablet 3  . gabapentin (NEURONTIN) 300 MG capsule Take 1 capsule (300 mg total) by mouth 3 (three) times daily. 90 capsule 3  . glucose blood (TRUE METRIX BLOOD GLUCOSE TEST) test strip Use as instructed (Patient  taking differently: 1 each by Other route as directed. ) 100 each 12  . insulin aspart (NOVOLOG) 100 UNIT/ML injection Inject 0-12 Units into the skin 3 (three) times daily. As perSliding-scale (Patient taking differently: Inject 0-12 Units into the skin 3 (three) times daily. As per Sliding-Scale) 30 mL 3  . Insulin Glargine (LANTUS SOLOSTAR) 100 UNIT/ML Solostar Pen Inject 30 Units into the skin 2 (two) times daily. (Patient taking differently: Inject 32 Units into the skin 2 (two) times daily. ) 30 mL 5  . Insulin Pen Needle (TRUEPLUS 5-BEVEL PEN NEEDLES) 32G X 4 MM MISC Use as instructed to inject insulin and Victoza daily. (Patient taking differently: 1 each by Other route See admin instructions. Use as instructed to inject insulin and Victoza daily.) 100 each 11  . liraglutide (VICTOZA) 18 MG/3ML SOPN Inject 0.3 mLs (1.8 mg total) into the skin daily. 30 mL 6  . lisinopril-hydrochlorothiazide (ZESTORETIC) 20-12.5 MG tablet Take 2 tablets by mouth daily. 180 tablet 1  . metFORMIN (GLUCOPHAGE) 1000 MG tablet Take 1 tablet (1,000 mg total) by mouth 2 (two) times daily with a meal. 180 tablet 1  . metoprolol tartrate (LOPRESSOR) 50 MG tablet Take 1 tablet (50 mg total) by mouth 2 (two) times daily. [place]  tablet 6  . simvastatin (ZOCOR) 40 MG tablet Take 1 tablet (40 mg total) by mouth at bedtime. 30 tablet 3  . traZODone (DESYREL) 100 MG tablet Take 1 tablet (100 mg total) by mouth at bedtime as needed for sleep. 30 tablet 3  . TRUEplus Lancets 28G MISC USE AS INSTRUCTED (Patient taking differently: 1 each by Other route as directed. ) 100 each 5  . Clotrimazole 1 % OINT Apply 1 each topically 2 (two) times daily. (Patient not taking: Reported on 06/27/2019) 56.7 g 0  . ibuprofen (ADVIL) 600 MG tablet Take 1 tablet (600 mg total) by mouth every 6 (six) hours as needed. (Patient not taking: Reported on 08/07/2019) 30 tablet 0  . metroNIDAZOLE (METROGEL VAGINAL) 0.75 % vaginal gel Place 1 Applicatorful  vaginally at bedtime. (Patient not taking: Reported on 05/07/2019) 70 g 0  . promethazine (PHENERGAN) 25 MG tablet Take 1 tablet (25 mg total) by mouth every 6 (six) hours as needed for nausea or vomiting. (Patient not taking: Reported on 08/07/2019) 10 tablet 0  . sucralfate (CARAFATE) 1 g tablet Take 1 tablet (1 g total) by mouth 4 (four) times daily -  with meals and at bedtime. (Patient not taking: Reported on 06/27/2019) 60 tablet 0   No facility-administered medications prior to visit.     ROS Review of Systems  Objective:  BP 117/70   Pulse 90   Ht 5' 5" (1.651 m)   Wt 221 lb (100.2 kg)   SpO2 100%   BMI 36.78 kg/m   BP/Weight 08/20/2019 08/07/2019 03/09/1592  Systolic BP 585 929 244  Diastolic BP 70 70 77  Wt. (Lbs) 221 205 -  BMI 36.78 34.11 -      Physical Exam  CMP Latest Ref Rng & Units 08/07/2019 06/26/2019 02/04/2019  Glucose 70 - 99 mg/dL 204(H) 125(H) 70  BUN 6 - 20 mg/dL _0 Creatinine 0.44 - 1.00 mg/dL 0.90 0.81 0.93  Sodium 135 - 145 mmol/L 135 138 141  Potassium 3.5 - 5.1 mmol/L 4.0 3.5 4.1  Chloride 98 - 111 mmol/L 103 107 106  CO2 22 - 32 mmol/L 21(L) 23 23  Calcium 8.9 - 10.3 mg/dL 8.8(L) 9.0 9.4  Total Protein 6.5 - 8.1 g/dL 6.7 7.1 7.2  Total Bilirubin 0.3 - 1.2 mg/dL 0.5 0.4 <0.2  Alkaline Phos 38 - 126 U/L 53 54 74  AST 15 - 41 U/L _1 ALT 0 - 44 U/L _2 Lipid Panel     Component Value Date/Time   CHOL 226 (H) 05/07/2019 1043   TRIG 190 (H) 05/07/2019 1043   HDL 38 (L) 05/07/2019 1043   CHOLHDL 5.9 (H) 05/07/2019 1043   CHOLHDL 4.7 05/25/2016 1543   VLDL 37 (H) 05/25/2016 1543   LDLCALC 153 (H) 05/07/2019 1043    CBC    Component Value Date/Time   WBC 7.1 08/07/2019 0705   RBC 4.16 08/07/2019 0705   HGB 12.2 08/07/2019 0705   HCT 38.6 08/07/2019 0705   PLT 355 08/07/2019 0705   MCV 92.8 08/07/2019 0705   MCH 29.3 08/07/2019 0705   MCHC 31.6 08/07/2019 0705   RDW 13.9 08/07/2019 0705   LYMPHSABS 2.4 10/05/2017 1628    MONOABS 0.5 10/05/2017 1628   EOSABS 0.1 10/05/2017 1628   BASOSABS 0.0 10/05/2017 1628    Lab Results  Component Value Date   HGBA1C 8.6 (A) 08/20/2019     Assessment &  Plan:  1. Type 2 diabetes mellitus with diabetic polyneuropathy, with long-term current use of insulin (HCC) *** - POCT glucose (manual entry) - POCT glycosylated hemoglobin (Hb A1C)   Health Care Maintenance: *** No orders of the defined types were placed in this encounter.   Follow-up: No follow-ups on file.       Charlott Rakes, MD, FAAFP. [facility] and [facility] [place], [place]   08/20/2019, 10:24 AM

## 2019-08-21 LAB — LIPID PANEL
Chol/HDL Ratio: 3.8 ratio (ref 0.0–4.4)
Cholesterol, Total: 180 mg/dL (ref 100–199)
HDL: 48 mg/dL (ref >39)
LDL Chol Calc (NIH): 114 mg/dL — ABNORMAL HIGH (ref 0–99)
Triglycerides: 98 mg/dL (ref 0–149)
VLDL Cholesterol Cal: 18 mg/dL (ref 5–40)

## 2019-08-22 LAB — H. PYLORI BREATH TEST: H pylori Breath Test: NEGATIVE

## 2019-09-23 MED FILL — !LANTUS SOLOSTAR 100UNITS/M: 100 | 22 days supply | Qty: 12 | Fill #4

## 2019-09-23 MED FILL — TRUEPLUS 5-BEVEL PEN NEEDLE: 31G X 5 MM | 25 days supply | Qty: 100 | Fill #1

## 2019-09-23 MED FILL — $novoLOG 100 UNITS/ML VIAL: 100 | 83 days supply | Qty: 30 | Fill #2

## 2019-10-02 ENCOUNTER — Encounter (HOSPITAL_COMMUNITY): Payer: Self-pay | Admitting: Pediatrics

## 2019-10-02 ENCOUNTER — Emergency Department (HOSPITAL_COMMUNITY): Payer: PRIVATE HEALTH INSURANCE

## 2019-10-02 ENCOUNTER — Ambulatory Visit: Payer: Self-pay | Admitting: Family Medicine

## 2019-10-02 ENCOUNTER — Other Ambulatory Visit: Payer: Self-pay

## 2019-10-02 ENCOUNTER — Emergency Department (HOSPITAL_COMMUNITY)
Admission: EM | Admit: 2019-10-02 | Discharge: 2019-10-03 | Disposition: A | Discharge status: Home / Self Care | Payer: PRIVATE HEALTH INSURANCE | Attending: Emergency Medicine | Admitting: Emergency Medicine

## 2019-10-02 DIAGNOSIS — R0602 Shortness of breath: Secondary | ICD-10-CM | POA: Diagnosis present

## 2019-10-02 DIAGNOSIS — Z5321 Procedure and treatment not carried out due to patient leaving prior to being seen by health care provider: Secondary | ICD-10-CM | POA: Diagnosis not present

## 2019-10-02 DIAGNOSIS — R42 Dizziness and giddiness: Secondary | ICD-10-CM | POA: Insufficient documentation

## 2019-10-02 DIAGNOSIS — Z20822 Contact with and (suspected) exposure to covid-19: Secondary | ICD-10-CM | POA: Diagnosis not present

## 2019-10-02 DIAGNOSIS — R079 Chest pain, unspecified: Secondary | ICD-10-CM | POA: Diagnosis not present

## 2019-10-02 LAB — BASIC METABOLIC PANEL
Anion gap: 12 (ref 5–15)
BUN: 9 mg/dL (ref 6–20)
CO2: 22 mmol/L (ref 22–32)
Calcium: 9 mg/dL (ref 8.9–10.3)
Chloride: 105 mmol/L (ref 98–111)
Creatinine, Ser: 0.77 mg/dL (ref 0.44–1.00)
GFR calc Af Amer: >60 mL/min (ref >60)
GFR calc non Af Amer: >60 mL/min (ref >60)
Glucose, Bld: 150 mg/dL — ABNORMAL HIGH (ref 70–99)
Potassium: 4 mmol/L (ref 3.5–5.1)
Sodium: 139 mmol/L (ref 135–145)

## 2019-10-02 LAB — CBC
HCT: 34.9 % — ABNORMAL LOW (ref 36.0–46.0)
Hemoglobin: 11 g/dL — ABNORMAL LOW (ref 12.0–15.0)
MCH: 29.3 pg (ref 26.0–34.0)
MCHC: 31.5 g/dL (ref 30.0–36.0)
MCV: 92.8 fL (ref 80.0–100.0)
Platelets: 326 10*3/uL (ref 150–400)
RBC: 3.76 MIL/uL — ABNORMAL LOW (ref 3.87–5.11)
RDW: 13.2 % (ref 11.5–15.5)
WBC: 6.2 10*3/uL (ref 4.0–10.5)
nRBC: 0 % (ref 0.0–0.2)

## 2019-10-02 LAB — CBG MONITORING, ED
Glucose-Capillary: 123 mg/dL — ABNORMAL HIGH (ref 70–99)
Glucose-Capillary: 128 mg/dL — ABNORMAL HIGH (ref 70–99)

## 2019-10-02 LAB — I-STAT BETA HCG BLOOD, ED (MC, WL, AP ONLY): I-stat hCG, quantitative: <5 m[IU]/mL (ref <5)

## 2019-10-02 LAB — TROPONIN I (HIGH SENSITIVITY): Troponin I (High Sensitivity): 2 ng/L (ref <18)

## 2019-10-02 MED ORDER — SODIUM CHLORIDE 0.9% FLUSH: 3.0000 mL | Freq: Once | INTRAVENOUS | Status: DC

## 2019-10-02 NOTE — ED Notes (Signed)
[phone] Mariah Miller would like a call back please. For an update

## 2019-10-02 NOTE — Telephone Encounter (Signed)
Will route to Pcp for review.  Patient has been to ED today for problem

## 2019-10-02 NOTE — Telephone Encounter (Signed)
Pt reports sent home from work last night; dizziness, generalizes weakness. States BS at that time 350. BS checked during call, 281.No missed doses of insulin or metformin. Reports BS has been running 200s-300s for a week.No appetite, dry mouth, increased thirst.  States presently dizzy "Sometimes spinning" and "Feels like my head is heavy like it could fall off so heavy." Also reports SOB, "Bronchitis"  Directed to UC. Assured pt NT would route to practice for PCPs review.  Pt verbalizes understanding. Reason for Disposition  [1] Blood glucose > 300 mg/dL (21.2 mmol/L) AND [2] two or more times in a row  Answer Assessment - Initial Assessment Questions 1. BLOOD GLUCOSE: "What is your blood glucose level?"      Last night 350 During call 281 2. ONSET: "When did you check the blood glucose?"     Last night  Thia AM 281 3. USUAL RANGE: "What is your glucose level usually?" (e.g., usual fasting morning value, usual evening value)     200-300 last several days 4. KETONES: "Do you check for ketones (urine or blood test strips)?" If yes, ask: "What does the test show now?"     no 5. TYPE 1 or 2:  "Do you know what type of diabetes you have?"  (e.g., Type 1, Type 2, Gestational; doesn't know)      2 6. INSULIN: "Do you take insulin?" "What type of insulin(s) do you use? What is the mode of delivery? (syringe, pen; injection or pump)?"      Yes, no missed doses 7. DIABETES PILLS: "Do you take any pills for your diabetes?" If yes, ask: "Have you missed taking any pills recently?"     Yes, no missed doses 8. OTHER SYMPTOMS: "Do you have any symptoms?" (e.g., fever, frequent urination, difficulty breathing, dizziness, weakness, vomiting)    Dizziness, dry mouth, weakness, SOB "Bronchitis"  Protocols used: DIABETES - HIGH BLOOD SUGAR-A-AH

## 2019-10-02 NOTE — ED Triage Notes (Signed)
C/o SOB and dizziness x 1 day; from home.

## 2019-10-03 ENCOUNTER — Emergency Department (HOSPITAL_BASED_OUTPATIENT_CLINIC_OR_DEPARTMENT_OTHER): Payer: PRIVATE HEALTH INSURANCE

## 2019-10-03 ENCOUNTER — Emergency Department (HOSPITAL_COMMUNITY)
Admission: EM | Admit: 2019-10-03 | Discharge: 2019-10-03 | Disposition: A | Discharge status: Home / Self Care | Payer: PRIVATE HEALTH INSURANCE | Source: Home / Self Care | Attending: Emergency Medicine | Admitting: Emergency Medicine

## 2019-10-03 ENCOUNTER — Ambulatory Visit: Payer: Self-pay | Admitting: Family Medicine

## 2019-10-03 ENCOUNTER — Emergency Department (HOSPITAL_COMMUNITY): Payer: PRIVATE HEALTH INSURANCE

## 2019-10-03 DIAGNOSIS — R42 Dizziness and giddiness: Secondary | ICD-10-CM

## 2019-10-03 DIAGNOSIS — M79609 Pain in unspecified limb: Secondary | ICD-10-CM | POA: Diagnosis not present

## 2019-10-03 DIAGNOSIS — R0602 Shortness of breath: Secondary | ICD-10-CM

## 2019-10-03 LAB — BASIC METABOLIC PANEL
Anion gap: 6 (ref 5–15)
BUN: 7 mg/dL (ref 6–20)
CO2: 26 mmol/L (ref 22–32)
Calcium: 8.4 mg/dL — ABNORMAL LOW (ref 8.9–10.3)
Chloride: 101 mmol/L (ref 98–111)
Creatinine, Ser: 0.75 mg/dL (ref 0.44–1.00)
GFR calc Af Amer: >60 mL/min (ref >60)
GFR calc non Af Amer: >60 mL/min (ref >60)
Glucose, Bld: 239 mg/dL — ABNORMAL HIGH (ref 70–99)
Potassium: 3.6 mmol/L (ref 3.5–5.1)
Sodium: 133 mmol/L — ABNORMAL LOW (ref 135–145)

## 2019-10-03 LAB — CBC WITH DIFFERENTIAL/PLATELET
Abs Immature Granulocytes: 0.01 10*3/uL (ref 0.00–0.07)
Basophils Absolute: 0 10*3/uL (ref 0.0–0.1)
Basophils Relative: 1 %
Eosinophils Absolute: 0.1 10*3/uL (ref 0.0–0.5)
Eosinophils Relative: 3 %
HCT: 35.3 % — ABNORMAL LOW (ref 36.0–46.0)
Hemoglobin: 11.3 g/dL — ABNORMAL LOW (ref 12.0–15.0)
Immature Granulocytes: 0 %
Lymphocytes Relative: 46 %
Lymphs Abs: 2.3 10*3/uL (ref 0.7–4.0)
MCH: 29.1 pg (ref 26.0–34.0)
MCHC: 32 g/dL (ref 30.0–36.0)
MCV: 91 fL (ref 80.0–100.0)
Monocytes Absolute: 0.5 10*3/uL (ref 0.1–1.0)
Monocytes Relative: 10 %
Neutro Abs: 1.9 10*3/uL (ref 1.7–7.7)
Neutrophils Relative %: 40 %
Platelets: 331 10*3/uL (ref 150–400)
RBC: 3.88 MIL/uL (ref 3.87–5.11)
RDW: 13.2 % (ref 11.5–15.5)
WBC: 4.8 10*3/uL (ref 4.0–10.5)
nRBC: 0 % (ref 0.0–0.2)

## 2019-10-03 LAB — SARS CORONAVIRUS 2 BY RT PCR (HOSPITAL ORDER, PERFORMED IN ~~LOC~~ HOSPITAL LAB): SARS Coronavirus 2: NEGATIVE

## 2019-10-03 LAB — D-DIMER, QUANTITATIVE: D-Dimer, Quant: 0.33 ug/mL-FEU (ref 0.00–0.50)

## 2019-10-03 MED ORDER — DIPHENHYDRAMINE HCL 50 MG/ML IJ SOLN
12.5000 mg | Freq: Once | INTRAMUSCULAR | Status: AC
  Administered 2019-10-03: 12.5 mg via INTRAVENOUS
  Filled 2019-10-03: qty 1

## 2019-10-03 MED ORDER — SODIUM CHLORIDE 0.9 % IV BOLUS
1000.0000 mL | Freq: Once | INTRAVENOUS | Status: AC
  Administered 2019-10-03: 1000 mL via INTRAVENOUS

## 2019-10-03 MED ORDER — METOCLOPRAMIDE HCL 5 MG/ML IJ SOLN
10.0000 mg | Freq: Once | INTRAMUSCULAR | Status: AC
  Administered 2019-10-03: 10 mg via INTRAVENOUS
  Filled 2019-10-03: qty 2

## 2019-10-03 NOTE — Progress Notes (Signed)
VASCULAR LAB PRELIMINARY  PRELIMINARY  PRELIMINARY  PRELIMINARY  Right lower extremity venous duplex completed.    Preliminary report:  See CV proc for preliminary results.  Denny Lave, RVT 10/03/2019, 6:54 PM

## 2019-10-03 NOTE — ED Provider Notes (Signed)
[facility] EMERGENCY DEPARTMENT Provider Note   CSN: [id number] Arrival date & time: 10/03/19  1048     History Chief Complaint  Patient presents with  . Shortness of Breath  . Dizziness    Mariah Miller is a 44 y.o. woman with history of insulin-dependent DM, HTN, and HLD who presents with a three day history of shortness of breath, dizziness, and lower extremity weakness.  The patient states she was in her usual state of health until 3 nights ago on Monday (7/26) when she was at work and felt suddenly short of breath and "couldn't take a deep breath." This continued, and a short while later she was standing at her job and had sudden onset of dizziness which she describes as feeling as if the room is spinning around her. She took a break and felt somewhat better. However, since these symptoms persisted and her blood sugars "have been all over the place" in the mid 200s, she called her PCP yesterday (7/28) but since she couldn't be seen immediately, she presented to this ED yesterday. However, after reportedly waiting 11 hours, she left without being seen.   On evaluation, she endorses persistent dizziness and shortness of breath. She also endorses a mild frontal headache with onset earlier today. States the headache feels as if "veins are jumping out." She endorses lower extremity weakness, R>L, with onset over the last couple of days. She states she has also been limping secondary to R calf pain. Denies trauma to the area. She denies fever/chills, cough/congestion/rhinorrhea, chest pain, abdominal pain, N/V/D, urinary complaints, numbness. She has not been vaccinated against COVID but denies sick contacts or known exposure to anyone with COVID. She states she wears a mask at work and when out and about. She denies any personal history of cancer, recent surgery, or recent prolonged immobilization. She has a remote (20 years ago) 2-year smoking history.  The patient is  requesting a work note to return to work 3 days from now on Sunday, 10/06/19.    Past Medical History:  Diagnosis Date  . Depression   . Diabetes mellitus without complication ([place])   . Hypertension   . Migraine   . Seizures ([place])    self reported - last seizure was Oct 2019 "absent seizure"    Patient Active Problem List   Diagnosis Date Noted  . Eczema 05/26/2017  . Primary insomnia 09/28/2016  . Anxiety and depression 05/26/2016  . OSA (obstructive sleep apnea) 04/30/2015  . Chest pain 04/28/2015  . DM2 (diabetes mellitus, type 2) ([place]) 04/28/2015  . ASCUS with positive high risk HPV cervical 06/06/2014  . Anemia, iron deficiency 03/17/2014  . Dyslipidemia 03/17/2014  . Essential hypertension 03/17/2014  . Uncontrolled diabetes mellitus ([place]) 06/17/2013  . Hypoglycemia 04/08/2013  . Diabetic neuropathy, painful ([place]) 03/21/2013  . Other and unspecified hyperlipidemia 01/22/2013  . A C DEGENERATION-CHRONIC 10/27/2008  . SHOULDER PAIN 05/01/2008  . IMPINGEMENT SYNDROME 05/01/2008    Past Surgical History:  Procedure Laterality Date  . CHOLECYSTECTOMY    . CYST EXCISION    . SHOULDER OPEN ROTATOR CUFF REPAIR Left   . TONSILLECTOMY    . TUBAL LIGATION       OB History    Gravida  6   Para  3   Term  3   Preterm      AB  3   Living  3     SAB  2   TAB  Ectopic      Multiple      Live Births  3           Family History  Problem Relation Age of Onset  . Hypertension Mother   . Thyroid disease Mother   . Irregular heart beat Mother   . Bipolar disorder Sister   . Colon cancer Other   . Esophageal cancer Neg Hx   . Rectal cancer Neg Hx   . Stomach cancer Neg Hx     Social History   Tobacco Use  . Smoking status: Former Smoker    Types: Cigarettes  . Smokeless tobacco: Never Used  . Tobacco comment: Smoked for 4 years, quit age 57  Vaping Use  . Vaping Use: Never used  Substance Use Topics  . Alcohol use: Yes    Alcohol/week:  0.0 standard drinks    Comment: Holidays only  . Drug use: No    Home Medications Prior to Admission medications   Medication Sig Start Date End Date Taking? Authorizing Provider  aspirin EC 81 MG tablet Take 1 tablet (81 mg total) by mouth daily. 01/22/13   Dhungel, Flonnie Overman, MD  Blood Glucose Monitoring Suppl (TRUE METRIX METER) w/Device KIT USE AS DIRECTED Patient taking differently: 1 each by Other route as directed.  05/16/18   Charlott Rakes, MD  Clotrimazole 1 % OINT Apply 1 each topically 2 (two) times daily. Patient not taking: Reported on 06/27/2019 02/04/19   Charlott Rakes, MD  cyclobenzaprine (FLEXERIL) 10 MG tablet Take 1 tablet (10 mg total) by mouth 2 (two) times daily as needed for muscle spasms. 12/26/18   Domenic Moras, PA-C  ferrous sulfate 325 (65 FE) MG tablet Take 1 tablet (325 mg total) by mouth 2 (two) times daily with a meal. 04/24/17   Charlott Rakes, MD  gabapentin (NEURONTIN) 300 MG capsule Take 2 capsules (600 mg total) by mouth 3 (three) times daily. 08/20/19   Charlott Rakes, MD  glucose blood (TRUE METRIX BLOOD GLUCOSE TEST) test strip Use as instructed Patient taking differently: 1 each by Other route as directed.  05/16/18   Charlott Rakes, MD  ibuprofen (ADVIL) 600 MG tablet Take 1 tablet (600 mg total) by mouth every 6 (six) hours as needed. Patient not taking: Reported on 08/07/2019 12/26/18   Domenic Moras, PA-C  insulin aspart (NOVOLOG) 100 UNIT/ML injection Inject 0-12 Units into the skin 3 (three) times daily. As per Sliding-Scale 08/20/19   Charlott Rakes, MD  insulin glargine (LANTUS SOLOSTAR) 100 UNIT/ML Solostar Pen Inject 34 Units into the skin 2 (two) times daily. 08/20/19   Charlott Rakes, MD  Insulin Pen Needle (TRUEPLUS 5-BEVEL PEN NEEDLES) 32G X 4 MM MISC Use as instructed to inject insulin and Victoza daily. Patient taking differently: 1 each by Other route See admin instructions. Use as instructed to inject insulin and Victoza daily. 03/07/19    Charlott Rakes, MD  liraglutide (VICTOZA) 18 MG/3ML SOPN Inject 0.3 mLs (1.8 mg total) into the skin daily. 08/20/19   Charlott Rakes, MD  lisinopril-hydrochlorothiazide (ZESTORETIC) 20-12.5 MG tablet Take 2 tablets by mouth daily. 08/20/19   Charlott Rakes, MD  metFORMIN (GLUCOPHAGE) 1000 MG tablet Take 1 tablet (1,000 mg total) by mouth 2 (two) times daily with a meal. 08/20/19   Charlott Rakes, MD  metoprolol tartrate (LOPRESSOR) 50 MG tablet Take 1 tablet (50 mg total) by mouth 2 (two) times daily. 08/20/19   Charlott Rakes, MD  metroNIDAZOLE (METROGEL VAGINAL) 0.75 % vaginal gel Place  1 Applicatorful vaginally at bedtime. Patient not taking: Reported on 05/07/2019 02/05/19   Charlott Rakes, MD  promethazine (PHENERGAN) 25 MG tablet Take 1 tablet (25 mg total) by mouth every 6 (six) hours as needed for nausea or vomiting. Patient not taking: Reported on 08/07/2019 06/27/19   Dalia Heading, PA-C  simvastatin (ZOCOR) 40 MG tablet Take 1 tablet (40 mg total) by mouth at bedtime. 08/20/19   Charlott Rakes, MD  sucralfate (CARAFATE) 1 g tablet Take 1 tablet (1 g total) by mouth 4 (four) times daily -  with meals and at bedtime. Patient not taking: Reported on 06/27/2019 06/18/18   Muthersbaugh, Jarrett Soho, PA-C  traZODone (DESYREL) 100 MG tablet Take 1 tablet (100 mg total) by mouth at bedtime as needed for sleep. 08/20/19   Charlott Rakes, MD  TRUEplus Lancets 28G MISC USE AS INSTRUCTED Patient taking differently: 1 each by Other route as directed.  05/16/18   Charlott Rakes, MD    Allergies    Tomato  Review of Systems   Review of Systems  All other systems reviewed and are negative. All other systems reviewed and are negative for acute changes, except as noted in the HPI.   Physical Exam Updated Vital Signs BP 104/83 (BP Location: Left Arm)   Pulse 91   Temp 98.2 F (36.8 C) (Oral)   Resp 14   Ht '5\' 5"'$  (1.651 m)   Wt (!) 116.1 kg   LMP 09/30/2019   SpO2 100%   BMI 42.60 kg/m    Physical Exam Constitutional:      General: She is not in acute distress.    Appearance: She is not ill-appearing.     Comments: Well-appearing woman sitting upright in bed, in no acute distress  HENT:     Head: Normocephalic and atraumatic.     Mouth/Throat:     Mouth: Mucous membranes are moist.  Eyes:     Extraocular Movements: Extraocular movements intact.     Pupils: Pupils are equal, round, and reactive to light.  Cardiovascular:     Rate and Rhythm: Normal rate and regular rhythm.     Comments: Trace edema at bilateral ankles. Pulmonary:     Effort: Pulmonary effort is normal.     Breath sounds: Normal breath sounds. No decreased breath sounds, wheezing, rhonchi or rales.  Abdominal:     Palpations: Abdomen is soft.     Tenderness: There is no abdominal tenderness.  Musculoskeletal:     Cervical back: Normal range of motion and neck supple.     Right lower leg: Tenderness present.     Comments: Tenderness to palpation of the R calf, without overlying erythema or edema  Skin:    General: Skin is warm and dry.  Neurological:     Mental Status: She is alert and oriented to person, place, and time.     Cranial Nerves: Cranial nerves are intact.     Sensory: Sensation is intact.     Motor: No pronator drift.     Comments: 4/5 strength in RUE and RLE. 5/5 strength in LUE and LLE. Antalgic gait. No pronator drift. Negative Romberg.     ED Results / Procedures / Treatments   Labs (all labs ordered are listed, but only abnormal results are displayed) Labs Reviewed  CBC WITH DIFFERENTIAL/PLATELET - Abnormal; Notable for the following components:      Result Value   Hemoglobin 11.3 (*)    HCT 35.3 (*)    All other components  within normal limits  BASIC METABOLIC PANEL - Abnormal; Notable for the following components:   Sodium 133 (*)    Glucose, Bld 239 (*)    Calcium 8.4 (*)    All other components within normal limits  SARS CORONAVIRUS 2 BY RT PCR [facility] ORDER,  [place] [facility])  D-DIMER, QUANTITATIVE (NOT AT [facility])    EKG EKG Interpretation  Date/Time:  Thursday October 03 2019 11:17:16 EDT Ventricular Rate:  67 PR Interval:  140 QRS Duration: 94 QT Interval:  404 QTC Calculation: 426 R Axis:   71 Text Interpretation: Normal sinus rhythm Normal ECG No significant change since last tracing Confirmed by Wandra Arthurs [phone]) on 10/03/2019 4:56:15 PM   Radiology DG Chest 2 View  Result Date: 10/03/2019 CLINICAL DATA:  Shortness of breath EXAM: CHEST - 2 VIEW COMPARISON:  None. FINDINGS: The heart size and mediastinal contours are within normal limits. Both lungs are clear. No pleural effusion or pneumothorax. The visualized skeletal structures are unremarkable. IMPRESSION: No acute process in the chest. Electronically Signed   By: Macy Mis M.D.   On: 10/03/2019 11:43   DG Chest 2 View  Result Date: 10/02/2019 CLINICAL DATA:  Shortness of breath. EXAM: CHEST - 2 VIEW COMPARISON:  May 09, 2017. FINDINGS: The heart size and mediastinal contours are within normal limits. Both lungs are clear. No pneumothorax or pleural effusion is noted. The visualized skeletal structures are unremarkable. IMPRESSION: No active cardiopulmonary disease. Electronically Signed   By: Marijo Conception M.D.   On: 10/02/2019 14:57   CT Head Wo Contrast  Result Date: 10/03/2019 CLINICAL DATA:  Dizziness EXAM: CT HEAD WITHOUT CONTRAST TECHNIQUE: Contiguous axial images were obtained from the base of the skull through the vertex without intravenous contrast. COMPARISON:  11/27/2017 FINDINGS: Brain: No evidence of acute infarction, hemorrhage, hydrocephalus, extra-axial collection or mass lesion/mass effect. Vascular: No hyperdense vessel or unexpected calcification. Skull: Normal. Negative for fracture or focal lesion. Sinuses/Orbits: No acute finding. Other: None. IMPRESSION: No acute intracranial abnormality noted. Electronically Signed   By: Inez Catalina  M.D.   On: 10/03/2019 19:46   VAS [place] LOWER EXTREMITY VENOUS (DVT) (ONLY [facility] & [facility])  Result Date: 10/03/2019  Lower Venous DVTStudy Indications: Pain.  Comparison Study: No prior study on file for comparison Performing Technologist: Sharion Dove RVS  Examination Guidelines: A complete evaluation includes B-mode imaging, spectral Doppler, color Doppler, and power Doppler as needed of all accessible portions of each vessel. Bilateral testing is considered an integral part of a complete examination. Limited examinations for reoccurring indications may be performed as noted. The reflux portion of the exam is performed with the patient in reverse Trendelenburg.  +---------+---------------+---------+-----------+----------+--------------+ RIGHT    CompressibilityPhasicitySpontaneityPropertiesThrombus Aging +---------+---------------+---------+-----------+----------+--------------+ CFV      Full           Yes      Yes                                 +---------+---------------+---------+-----------+----------+--------------+ SFJ      Full                                                        +---------+---------------+---------+-----------+----------+--------------+ FV Prox  Full                                                        +---------+---------------+---------+-----------+----------+--------------+  FV Mid   Full                                                        +---------+---------------+---------+-----------+----------+--------------+ FV DistalFull                                                        +---------+---------------+---------+-----------+----------+--------------+ PFV      Full                                                        +---------+---------------+---------+-----------+----------+--------------+ POP      Full           Yes      Yes                                  +---------+---------------+---------+-----------+----------+--------------+ PTV      Full                                                        +---------+---------------+---------+-----------+----------+--------------+ PERO     Full                                                        +---------+---------------+---------+-----------+----------+--------------+   Left Technical Findings: Left leg not evaluated.   Summary: RIGHT: - There is no evidence of deep vein thrombosis in the lower extremity.  - No cystic structure found in the popliteal fossa.   *See table(s) above for measurements and observations.    Preliminary     Medications Ordered in ED Medications  sodium chloride 0.9 % bolus 1,000 mL (0 mLs Intravenous Stopped 10/03/19 2129)  metoCLOPramide (REGLAN) injection 10 mg (10 mg Intravenous Given 10/03/19 1955)  diphenhydrAMINE (BENADRYL) injection 12.5 mg (12.5 mg Intravenous Given 10/03/19 1954)    ED Course  I have reviewed the triage vital signs and the nursing notes.  Pertinent labs & imaging results that were available during my care of the patient were reviewed by me and considered in my medical decision making (see chart for details).   MDM Rules/Calculators/A&P                          Mariah Miller is a 44 y.o. woman with history of insulin-dependent DM, HTN, and HLD who presents with a three day history of shortness of breath, dizziness, and lower extremity weakness.  The patient is well-appearing and slightly hypertensive but otherwise stable vitals, satting 97% on room air. Exam notable for mild RUE and RLE weakness, tenderness to palpation of R calf.  CN II-XII intact, negative Romberg, no pronator drift, normal work of breathing and good air movement. Will obtain CBC and BMP. Will screen for coronavirus. Will obtain CXR to evaluate for pulmonary or cardiac processes. Given her R calf pain, will obtain RLE doppler to evaluate for DVT and D-dimer to guide  need for CT PE protocol. Given the patient's R-sided weakness, will obtain CT head wo contrast. Will treat her headache with metoclopramide and diphenhydramine and reevaluate.  - CBC with mild anemia (Hgb 11.3). BMP with hyperglycemia (239) otherwise undermarkable.  - CT head wo contrast negative for acute intracranial abnormality. - CXR negative. - Lower extremity doppler without evidence of DVT in the RLE. - D-dimer wnl. Will not pursue CT PE protocol. - COVID test is negative. - Patient states her headache and dizziness are improved. She was discharged home with return precautions and recommendation to contact her PCP in the next business day to schedule a follow-up as needed. Work note provided.   Final Clinical Impression(s) / ED Diagnoses Final diagnoses:  Dizziness  Shortness of breath    Rx / DC Orders ED Discharge Orders    None       Alexandria Lodge, MD 10/04/19 [place]    Drenda Freeze, MD 10/06/19 [phone]

## 2019-10-03 NOTE — Discharge Instructions (Addendum)
Today you were evaluated for shortness of breath, dizziness, and leg weakness. Your work-up -  including head imaging, lower leg ultrasound, chest xray, and blood work - was reassuringly negative. Your COVID-19 test was also negative. We recommend you call your primary care physician in the next business day to let them know you were seen and evaluated in the emergency department, and they can help you decide when you should be seen next.

## 2019-10-03 NOTE — Telephone Encounter (Signed)
Will route to PCP for review. 

## 2019-10-03 NOTE — Telephone Encounter (Signed)
She is currently in the ED. Once she gets discharged I can write a note for her per request.

## 2019-10-03 NOTE — ED Notes (Signed)
Pt states she saw her mychart and is going to follow up with doc in am to say she tried to come in and be seen

## 2019-10-03 NOTE — Telephone Encounter (Signed)
Pt seen in ED yesterday, SOB, dizziness. States had labs drawn, CXR all WNL. States left after 10 hours.  Pt calling to report SOB "A little better." Remains dizzy, no worsening. Pt states she would like inhalers "I haven't had those in a while." Not on current med profile.  Also states she needs a note for work stating she was in ED yesterday.  NT called practice for further advise/disposition, no answer. Advised return to UC/ED if symptoms worsen.  Please advise : [phone] 

## 2019-10-03 NOTE — ED Triage Notes (Signed)
Pt here from home after LWBS last night for eval of continued shortness of breath at rest and dizzy spells x 2 days. Also c/o headache and lack of appetite.

## 2019-10-03 NOTE — Telephone Encounter (Signed)
Pt seen in ED yesterday, SOB, dizziness. States had labs drawn, CXR all WNL. States left after 10 hours.  Pt calling to report SOB "A little better." Remains dizzy, no worsening. Pt states she would like inhalers "I haven't had those in a while." Not on current med profile.  Also states she needs a note for work stating she was in ED yesterday.  NT called practice for further advise/disposition, no answer. Advised return to UC/ED if symptoms worsen.  Please advise : [phone]

## 2019-10-04 NOTE — Telephone Encounter (Addendum)
See documentation from yesterday. Already seen at the ED.

## 2019-10-31 MED FILL — !LANTUS SOLOSTAR 100UNITS/M: 100 | 22 days supply | Qty: 12 | Fill #5

## 2019-11-06 ENCOUNTER — Other Ambulatory Visit: Payer: Self-pay

## 2019-11-06 ENCOUNTER — Encounter (HOSPITAL_COMMUNITY): Payer: Self-pay

## 2019-11-06 ENCOUNTER — Emergency Department (HOSPITAL_COMMUNITY)
Admission: EM | Admit: 2019-11-06 | Discharge: 2019-11-06 | Disposition: A | Discharge status: Home / Self Care | Payer: BLUE CROSS/BLUE SHIELD | Attending: Emergency Medicine | Admitting: Emergency Medicine

## 2019-11-06 ENCOUNTER — Emergency Department (HOSPITAL_COMMUNITY): Payer: BLUE CROSS/BLUE SHIELD

## 2019-11-06 DIAGNOSIS — Y9389 Activity, other specified: Secondary | ICD-10-CM | POA: Insufficient documentation

## 2019-11-06 DIAGNOSIS — Z79899 Other long term (current) drug therapy: Secondary | ICD-10-CM | POA: Diagnosis not present

## 2019-11-06 DIAGNOSIS — W010XXA Fall on same level from slipping, tripping and stumbling without subsequent striking against object, initial encounter: Secondary | ICD-10-CM | POA: Insufficient documentation

## 2019-11-06 DIAGNOSIS — Z87891 Personal history of nicotine dependence: Secondary | ICD-10-CM | POA: Diagnosis not present

## 2019-11-06 DIAGNOSIS — Z7982 Long term (current) use of aspirin: Secondary | ICD-10-CM | POA: Insufficient documentation

## 2019-11-06 DIAGNOSIS — I10 Essential (primary) hypertension: Secondary | ICD-10-CM | POA: Insufficient documentation

## 2019-11-06 DIAGNOSIS — Y92090 Kitchen in other non-institutional residence as the place of occurrence of the external cause: Secondary | ICD-10-CM | POA: Diagnosis not present

## 2019-11-06 DIAGNOSIS — E1140 Type 2 diabetes mellitus with diabetic neuropathy, unspecified: Secondary | ICD-10-CM | POA: Diagnosis not present

## 2019-11-06 DIAGNOSIS — Y998 Other external cause status: Secondary | ICD-10-CM | POA: Diagnosis not present

## 2019-11-06 DIAGNOSIS — Z7984 Long term (current) use of oral hypoglycemic drugs: Secondary | ICD-10-CM | POA: Diagnosis not present

## 2019-11-06 DIAGNOSIS — S99921A Unspecified injury of right foot, initial encounter: Secondary | ICD-10-CM | POA: Diagnosis present

## 2019-11-06 DIAGNOSIS — S92511A Displaced fracture of proximal phalanx of right lesser toe(s), initial encounter for closed fracture: Secondary | ICD-10-CM | POA: Diagnosis not present

## 2019-11-06 NOTE — Progress Notes (Signed)
Orthopedic Tech Progress Note Patient Details:  Mariah Miller [birth year] [id number]  Ortho Devices Type of Ortho Device: Postop shoe/boot Ortho Device/Splint Location: LRE Ortho Device/Splint Interventions: Application, Ordered   Post Interventions Patient Tolerated: Well Instructions Provided: Adjustment of device, Care of device   Al Gagen A Imran Nuon 11/06/2019, 2:38 PM

## 2019-11-06 NOTE — ED Provider Notes (Signed)
[facility] EMERGENCY DEPARTMENT Provider Note   CSN: [id number] Arrival date & time: 11/06/19  2841     History Chief Complaint  Patient presents with  . Foot Pain    Mariah Miller is a 44 y.o. female.  HPI    Patient with significant medical history of depression, diabetes, migraines, presents to the emergency department with chief complaint of right 2nd toe pain that started on Sunday.  Patient states while she was in the kitchen a piece of glass was falling she went to catch it and she slipped and fell landing on her right 2nd big toe.  Patient denies hitting her head, losing consciousness, being on anticoags.  Since then she states it hurts when she puts pressure on it and she has some difficulty walking on it.  She was able to go to work on Monday and Tuesday but states she is having trouble getting around.  She is taking over-the-counter pain medication without any relief.  She is never experienced this pain before.  Patient denies headache, fever, chills, short of breath, chest pain, abdominal pain, dysuria.  Past Medical History:  Diagnosis Date  . Depression   . Diabetes mellitus without complication ([place])   . Hypertension   . Migraine   . Seizures ([place])    self reported - last seizure was Oct 2019 "absent seizure"    Patient Active Problem List   Diagnosis Date Noted  . Eczema 05/26/2017  . Primary insomnia 09/28/2016  . Anxiety and depression 05/26/2016  . OSA (obstructive sleep apnea) 04/30/2015  . Chest pain 04/28/2015  . DM2 (diabetes mellitus, type 2) ([place]) 04/28/2015  . ASCUS with positive high risk HPV cervical 06/06/2014  . Anemia, iron deficiency 03/17/2014  . Dyslipidemia 03/17/2014  . Essential hypertension 03/17/2014  . Uncontrolled diabetes mellitus ([place]) 06/17/2013  . Hypoglycemia 04/08/2013  . Diabetic neuropathy, painful ([place]) 03/21/2013  . Other and unspecified hyperlipidemia 01/22/2013  . A C DEGENERATION-CHRONIC  10/27/2008  . SHOULDER PAIN 05/01/2008  . IMPINGEMENT SYNDROME 05/01/2008    Past Surgical History:  Procedure Laterality Date  . CHOLECYSTECTOMY    . CYST EXCISION    . SHOULDER OPEN ROTATOR CUFF REPAIR Left   . TONSILLECTOMY    . TUBAL LIGATION       OB History    Gravida  6   Para  3   Term  3   Preterm      AB  3   Living  3     SAB  2   TAB      Ectopic      Multiple      Live Births  3           Family History  Problem Relation Age of Onset  . Hypertension Mother   . Thyroid disease Mother   . Irregular heart beat Mother   . Bipolar disorder Sister   . Colon cancer Other   . Esophageal cancer Neg Hx   . Rectal cancer Neg Hx   . Stomach cancer Neg Hx     Social History   Tobacco Use  . Smoking status: Former Smoker    Types: Cigarettes  . Smokeless tobacco: Never Used  . Tobacco comment: Smoked for 4 years, quit age 44  Vaping Use  . Vaping Use: Never used  Substance Use Topics  . Alcohol use: Yes    Alcohol/week: 0.0 standard drinks    Comment: Holidays only  .  Drug use: No    Home Medications Prior to Admission medications   Medication Sig Start Date End Date Taking? Authorizing Provider  aspirin EC 81 MG tablet Take 1 tablet (81 mg total) by mouth daily. 01/22/13   Dhungel, Flonnie Overman, MD  Blood Glucose Monitoring Suppl (TRUE METRIX METER) w/Device KIT USE AS DIRECTED Patient taking differently: 1 each by Other route as directed.  05/16/18   Charlott Rakes, MD  Clotrimazole 1 % OINT Apply 1 each topically 2 (two) times daily. Patient not taking: Reported on 06/27/2019 02/04/19   Charlott Rakes, MD  cyclobenzaprine (FLEXERIL) 10 MG tablet Take 1 tablet (10 mg total) by mouth 2 (two) times daily as needed for muscle spasms. 12/26/18   Domenic Moras, PA-C  ferrous sulfate 325 (65 FE) MG tablet Take 1 tablet (325 mg total) by mouth 2 (two) times daily with a meal. 04/24/17   Charlott Rakes, MD  gabapentin (NEURONTIN) 300 MG capsule Take  2 capsules (600 mg total) by mouth 3 (three) times daily. 08/20/19   Charlott Rakes, MD  glucose blood (TRUE METRIX BLOOD GLUCOSE TEST) test strip Use as instructed Patient taking differently: 1 each by Other route as directed.  05/16/18   Charlott Rakes, MD  ibuprofen (ADVIL) 600 MG tablet Take 1 tablet (600 mg total) by mouth every 6 (six) hours as needed. Patient not taking: Reported on 08/07/2019 12/26/18   Domenic Moras, PA-C  insulin aspart (NOVOLOG) 100 UNIT/ML injection Inject 0-12 Units into the skin 3 (three) times daily. As per Sliding-Scale 08/20/19   Charlott Rakes, MD  insulin glargine (LANTUS SOLOSTAR) 100 UNIT/ML Solostar Pen Inject 34 Units into the skin 2 (two) times daily. 08/20/19   Charlott Rakes, MD  Insulin Pen Needle (TRUEPLUS 5-BEVEL PEN NEEDLES) 32G X 4 MM MISC Use as instructed to inject insulin and Victoza daily. Patient taking differently: 1 each by Other route See admin instructions. Use as instructed to inject insulin and Victoza daily. 03/07/19   Charlott Rakes, MD  liraglutide (VICTOZA) 18 MG/3ML SOPN Inject 0.3 mLs (1.8 mg total) into the skin daily. 08/20/19   Charlott Rakes, MD  lisinopril-hydrochlorothiazide (ZESTORETIC) 20-12.5 MG tablet Take 2 tablets by mouth daily. 08/20/19   Charlott Rakes, MD  metFORMIN (GLUCOPHAGE) 1000 MG tablet Take 1 tablet (1,000 mg total) by mouth 2 (two) times daily with a meal. 08/20/19   Charlott Rakes, MD  metoprolol tartrate (LOPRESSOR) 50 MG tablet Take 1 tablet (50 mg total) by mouth 2 (two) times daily. 08/20/19   Charlott Rakes, MD  metroNIDAZOLE (METROGEL VAGINAL) 0.75 % vaginal gel Place 1 Applicatorful vaginally at bedtime. Patient not taking: Reported on 05/07/2019 02/05/19   Charlott Rakes, MD  promethazine (PHENERGAN) 25 MG tablet Take 1 tablet (25 mg total) by mouth every 6 (six) hours as needed for nausea or vomiting. Patient not taking: Reported on 08/07/2019 06/27/19   Dalia Heading, PA-C  simvastatin (ZOCOR) 40 MG  tablet Take 1 tablet (40 mg total) by mouth at bedtime. 08/20/19   Charlott Rakes, MD  sucralfate (CARAFATE) 1 g tablet Take 1 tablet (1 g total) by mouth 4 (four) times daily -  with meals and at bedtime. Patient not taking: Reported on 06/27/2019 06/18/18   Muthersbaugh, Jarrett Soho, PA-C  traZODone (DESYREL) 100 MG tablet Take 1 tablet (100 mg total) by mouth at bedtime as needed for sleep. 08/20/19   Charlott Rakes, MD  TRUEplus Lancets 28G MISC USE AS INSTRUCTED Patient taking differently: 1 each by Other route as  directed.  05/16/18   Charlott Rakes, MD    Allergies    Tomato  Review of Systems   Review of Systems  Constitutional: Negative for chills and fever.  HENT: Negative for congestion.   Respiratory: Negative for shortness of breath.   Cardiovascular: Negative for chest pain.  Gastrointestinal: Negative for abdominal pain.  Genitourinary: Negative for enuresis.  Musculoskeletal: Negative for back pain.       Right 2nd toe pain  Skin: Negative for rash.  Neurological: Negative for dizziness.  Hematological: Does not bruise/bleed easily.    Physical Exam Updated Vital Signs BP (!) 142/77   Pulse 71   Temp 98.8 F (37.1 C) (Oral)   Resp 16   Ht $R'5\' 5"'zA$  (1.651 m)   Wt 98 kg   LMP 11/02/2019   SpO2 99%   BMI 35.94 kg/m   Physical Exam Vitals and nursing note reviewed.  Constitutional:      General: She is not in acute distress.    Appearance: Normal appearance. She is not ill-appearing or diaphoretic.  HENT:     Head: Normocephalic and atraumatic.     Nose: No congestion or rhinorrhea.  Eyes:     General: No scleral icterus.       Right eye: No discharge.        Left eye: No discharge.     Conjunctiva/sclera: Conjunctivae normal.  Pulmonary:     Effort: Pulmonary effort is normal. No respiratory distress.     Breath sounds: Normal breath sounds. No wheezing.  Musculoskeletal:        General: Swelling, tenderness and signs of injury present.     Cervical back:  Neck supple.     Right lower leg: Edema present.     Left lower leg: No edema.     Comments: Patient's right foot was evaluated there was 1+ edema noted, 2nd right digit was swollen, no ecchymosis or other gross abnormalities noted.  Tender to palpation along the middle joint, she was able to slightly flex and extend the toe but was limited due to pain.  Neurovascular fully intact.  Skin:    General: Skin is warm and dry.     Coloration: Skin is not jaundiced or pale.  Neurological:     Mental Status: She is alert and oriented to person, place, and time.  Psychiatric:        Mood and Affect: Mood normal.     ED Results / Procedures / Treatments   Labs (all labs ordered are listed, but only abnormal results are displayed) Labs Reviewed - No data to display  EKG None  Radiology DG Foot Complete Right  Result Date: 11/06/2019 CLINICAL DATA:  Pain and swelling of the second toe after injury EXAM: RIGHT FOOT COMPLETE - 3+ VIEW COMPARISON:  None. FINDINGS: Acute minimally comminuted fracture involving the base of the second toe proximal phalanx with transverse fracture component at the metaphysis and a nondisplaced vertical component extending intra-articularly to the second MTP joint. Slight displacement and angulation at the metaphyseal fracture component. No dislocation. No additional fractures. There is soft tissue swelling at the fracture site. Joint spaces are preserved. Vascular calcifications are evident. IMPRESSION: Acute comminuted intra-articular fracture involving the base of the second toe proximal phalanx with slight displacement and angulation. Electronically Signed   By: Davina Poke D.O.   On: 11/06/2019 08:03    Procedures Procedures (including critical care time)  Medications Ordered in ED Medications - No data to display  ED Course  I have reviewed the triage vital signs and the nursing notes.  Pertinent labs & imaging results that were available during my care  of the patient were reviewed by me and considered in my medical decision making (see chart for details).    MDM Rules/Calculators/A&P                          I have personally reviewed all imaging, labs and have interpreted them.  Patient presents emergency department with chief complaint of right 2nd toe pain after having a mechanical fall.  Patient was alert and oriented did not appear in acute distress, vital signs reassuring.  On evaluation right foot was seen there was slight 1+ edema noted on the dorsal side of the foot, right 2nd toe is swollen, no ecchymosis or erythema noted, she was able to flex and extend the toe but was limited due to pain, neurovascular fully intact.  Will order x-rays for further evaluation.  X-ray reveals an acute comminuted anterior articular fracture involving the base of the second toe proximal phalanx with slight displacement and angulation  Low suspicion for open fracture or fracture involving the ankle or distal part of the leg as leg and foot were examined and palpated no open wound or laceration was noted on the foot, no tenderness along patient's lower leg or ankle.  Low suspicion for compartment syndrome as neurovascular is fully intact, foot and toe soft to the touch.  Low suspicion for septic arthritis or overlying cellulitis as there is no erythema, it was not hot to the touch, patient denies IV drug use and history more consistent with a mechanical fall resulting in a fracture of the second toe.  No indication for fracture reduction as toe was in anatomically correct position.  Patient has a fractured second toe, will place in a buddy splint and provide a postop boot for further support.  Will provide her with a orthopedic referral as she will need further evaluation from them.  Patient was discussed with attending who agrees with assessment and plan.  Patient is stable for discharge, no indications for hospital admission.  Patient was given at home care  and strict return precautions.  Patient verbalized that she understood and agreed with said plan. Final Clinical Impression(s) / ED Diagnoses Final diagnoses:  Closed displaced fracture of proximal phalanx of lesser toe of right foot, initial encounter    Rx / DC Orders ED Discharge Orders    None       Marcello Fennel, PA-C 11/06/19 1319    Sherwood Gambler, MD 11/07/19 [phone]

## 2019-11-06 NOTE — Discharge Instructions (Addendum)
You have been seen here for a toe injury.  X-ray shows you have a fracture of the second toe.  I have buddy taped your toe and placed in a postop boot.  I recommend taking over-the-counter pain medications like ibuprofen and/or Tylenol every 6 hours as needed for pain.  Please follow dosing onthe back of bottle.  I also recommend keeping your foot elevated and applying ice to the area as this can help decrease inflammation and swelling.  You need to see a orthopedic doctor for further evaluation of your toe.  I have given the contact information of one, please call them at your earliest convenience as you will need further evaluation.  I want to come back to emergency department if you develop worsening toe pain, you experience pins-and-needles in your foot, your foot turns a different color, chest pain, shortness of breath, uncontrolled nausea, vomiting, diarrhea as the symptoms require further evaluation management.

## 2019-11-06 NOTE — ED Triage Notes (Signed)
Pt presents with Right foot swelling and pain after falling Sunday. Pt reports she's unable to put pressure on her Right foot

## 2019-11-21 ENCOUNTER — Ambulatory Visit: Payer: No Typology Code available for payment source | Admitting: Family Medicine

## 2019-12-02 ENCOUNTER — Ambulatory Visit: Payer: Self-pay

## 2019-12-02 ENCOUNTER — Other Ambulatory Visit: Payer: Self-pay | Admitting: Family Medicine

## 2019-12-02 ENCOUNTER — Encounter: Payer: Self-pay | Admitting: Family Medicine

## 2019-12-02 ENCOUNTER — Ambulatory Visit: Payer: BLUE CROSS/BLUE SHIELD | Attending: Family Medicine | Admitting: Family Medicine

## 2019-12-02 ENCOUNTER — Other Ambulatory Visit: Payer: Self-pay

## 2019-12-02 VITALS — BP 139/79 | HR 84 | Ht 65.0 in | Wt 212.0 lb

## 2019-12-02 DIAGNOSIS — Z794 Long term (current) use of insulin: Secondary | ICD-10-CM | POA: Diagnosis not present

## 2019-12-02 DIAGNOSIS — Z23 Encounter for immunization: Secondary | ICD-10-CM

## 2019-12-02 DIAGNOSIS — E1142 Type 2 diabetes mellitus with diabetic polyneuropathy: Secondary | ICD-10-CM | POA: Diagnosis not present

## 2019-12-02 DIAGNOSIS — E1165 Type 2 diabetes mellitus with hyperglycemia: Secondary | ICD-10-CM | POA: Diagnosis not present

## 2019-12-02 DIAGNOSIS — I10 Essential (primary) hypertension: Secondary | ICD-10-CM

## 2019-12-02 DIAGNOSIS — Z1159 Encounter for screening for other viral diseases: Secondary | ICD-10-CM

## 2019-12-02 LAB — POCT GLYCOSYLATED HEMOGLOBIN (HGB A1C): HbA1c, POC (controlled diabetic range): 10.6 % — AB (ref 0.0–7.0)

## 2019-12-02 LAB — GLUCOSE, POCT (MANUAL RESULT ENTRY): POC Glucose: 259 mg/dl — AB (ref 70–99)

## 2019-12-02 MED ORDER — LANTUS SOLOSTAR 100 UNIT/ML ~~LOC~~ SOPN: 34.0000 [IU] | PEN_INJECTOR | Freq: Two times a day (BID) | SUBCUTANEOUS | 6 refills | Status: DC

## 2019-12-02 MED ORDER — DAPAGLIFLOZIN PROPANEDIOL 10 MG PO TABS: 10.0000 mg | ORAL_TABLET | Freq: Every day | ORAL | 6 refills | Status: DC

## 2019-12-02 MED ORDER — INSULIN ASPART 100 UNIT/ML ~~LOC~~ SOLN: 0.0000 [IU] | Freq: Three times a day (TID) | SUBCUTANEOUS | 6 refills | Status: DC

## 2019-12-02 MED ORDER — GABAPENTIN 300 MG PO CAPS: 600.0000 mg | ORAL_CAPSULE | Freq: Three times a day (TID) | ORAL | 6 refills | Status: DC

## 2019-12-02 MED ORDER — VICTOZA 18 MG/3ML ~~LOC~~ SOPN: 1.8000 mg | PEN_INJECTOR | Freq: Every day | SUBCUTANEOUS | 6 refills | Status: DC

## 2019-12-02 MED FILL — FARXIGA 10 MG TABLET: 10 | 30 days supply | Qty: 30 | Fill #0

## 2019-12-02 MED FILL — $VICTOZA 2-PAK 18MG/3ML PEN: 18 | 90 days supply | Qty: 27 | Fill #0

## 2019-12-02 MED FILL — ?BASAGLAR 100 UNITS/ML KWPE: 100 | 24 days supply | Qty: 24 | Fill #0

## 2019-12-02 MED FILL — GABAPENTIN 300 MG CAPSULE: 300 | 30 days supply | Qty: 180 | Fill #0

## 2019-12-02 NOTE — Telephone Encounter (Signed)
Will route to PCP for review. 

## 2019-12-02 NOTE — Telephone Encounter (Signed)
Pt. Reports her blood sugars have been elevated x 3 days. This morning fasting blood sugar was 410. Usually runs 150-200. Has urinary frequency and increased thirst. Reports she has been compliant with her medications. No changes in diet. Spoke with Malachi Bonds. Appointment made for today.  Answer Assessment - Initial Assessment Questions 1. BLOOD GLUCOSE: "What is your blood glucose level?"      410 2. ONSET: "When did you check the blood glucose?"     This morning 3. USUAL RANGE: "What is your glucose level usually?" (e.g., usual fasting morning value, usual evening value)     150-200 4. KETONES: "Do you check for ketones (urine or blood test strips)?" If yes, ask: "What does the test show now?"      No 5. TYPE 1 or 2:  "Do you know what type of diabetes you have?"  (e.g., Type 1, Type 2, Gestational; doesn't know)      Type 1 6. INSULIN: "Do you take insulin?" "What type of insulin(s) do you use? What is the mode of delivery? (syringe, pen; injection or pump)?"      Lantus 34 units am and pm 7. DIABETES PILLS: "Do you take any pills for your diabetes?" If yes, ask: "Have you missed taking any pills recently?"     No 8. OTHER SYMPTOMS: "Do you have any symptoms?" (e.g., fever, frequent urination, difficulty breathing, dizziness, weakness, vomiting)     Urinary frequency, thirsty 9. PREGNANCY: "Is there any chance you are pregnant?" "When was your last menstrual period?"     No  Protocols used: DIABETES - HIGH BLOOD SUGAR-A-AH

## 2019-12-02 NOTE — Patient Instructions (Signed)
Diabetes Mellitus and Foot Care Foot care is an important part of your health, especially when you have diabetes. Diabetes may cause you to have problems because of poor blood flow (circulation) to your feet and legs, which can cause your skin to:  Become thinner and drier.  Break more easily.  Heal more slowly.  Peel and crack. You may also have nerve damage (neuropathy) in your legs and feet, causing decreased feeling in them. This means that you may not notice minor injuries to your feet that could lead to more serious problems. Noticing and addressing any potential problems early is the best way to prevent future foot problems. How to care for your feet Foot hygiene  Wash your feet daily with warm water and mild soap. Do not use hot water. Then, pat your feet and the areas between your toes until they are completely dry. Do not soak your feet as this can dry your skin.  Trim your toenails straight across. Do not dig under them or around the cuticle. File the edges of your nails with an emery board or nail file.  Apply a moisturizing lotion or petroleum jelly to the skin on your feet and to dry, brittle toenails. Use lotion that does not contain alcohol and is unscented. Do not apply lotion between your toes. Shoes and socks  Wear clean socks or stockings every day. Make sure they are not too tight. Do not wear knee-high stockings since they may decrease blood flow to your legs.  Wear shoes that fit properly and have enough cushioning. Always look in your shoes before you put them on to be sure there are no objects inside.  To break in new shoes, wear them for just a few hours a day. This prevents injuries on your feet. Wounds, scrapes, corns, and calluses  Check your feet daily for blisters, cuts, bruises, sores, and redness. If you cannot see the bottom of your feet, use a mirror or ask someone for help.  Do not cut corns or calluses or try to remove them with medicine.  If you  find a minor scrape, cut, or break in the skin on your feet, keep it and the skin around it clean and dry. You may clean these areas with mild soap and water. Do not clean the area with peroxide, alcohol, or iodine.  If you have a wound, scrape, corn, or callus on your foot, look at it several times a day to make sure it is healing and not infected. Check for: ? Redness, swelling, or pain. ? Fluid or blood. ? Warmth. ? Pus or a bad smell. General instructions  Do not cross your legs. This may decrease blood flow to your feet.  Do not use heating pads or hot water bottles on your feet. They may burn your skin. If you have lost feeling in your feet or legs, you may not know this is happening until it is too late.  Protect your feet from hot and cold by wearing shoes, such as at the beach or on hot pavement.  Schedule a complete foot exam at least once a year (annually) or more often if you have foot problems. If you have foot problems, report any cuts, sores, or bruises to your health care provider immediately. Contact a health care provider if:  You have a medical condition that increases your risk of infection and you have any cuts, sores, or bruises on your feet.  You have an injury that is not   healing.  You have redness on your legs or feet.  You feel burning or tingling in your legs or feet.  You have pain or cramps in your legs and feet.  Your legs or feet are numb.  Your feet always feel cold.  You have pain around a toenail. Get help right away if:  You have a wound, scrape, corn, or callus on your foot and: ? You have pain, swelling, or redness that gets worse. ? You have fluid or blood coming from the wound, scrape, corn, or callus. ? Your wound, scrape, corn, or callus feels warm to the touch. ? You have pus or a bad smell coming from the wound, scrape, corn, or callus. ? You have a fever. ? You have a red line going up your leg. Summary  Check your feet every day  for cuts, sores, red spots, swelling, and blisters.  Moisturize feet and legs daily.  Wear shoes that fit properly and have enough cushioning.  If you have foot problems, report any cuts, sores, or bruises to your health care provider immediately.  Schedule a complete foot exam at least once a year (annually) or more often if you have foot problems. This information is not intended to replace advice given to you by your health care provider. Make sure you discuss any questions you have with your health care provider. Document Revised: 11/14/2018 Document Reviewed: 03/25/2016 [employer]vier Patient Education  2020 [employer]  

## 2019-12-02 NOTE — Progress Notes (Signed)
Subjective:  Patient ID: Mariah Miller, female    DOB: 01-Feb-1976  Age: 44 y.o. MRN: [id number]  CC: Diabetes   HPI Mariah Miller is a 44 year old female with a history of type 2 diabetes mellitus (A1c10.6), hypertension, depression who presents today for follow-up visit. She suffered a right second toe closed displaced fracture a couple weeks ago and is currently being followed by podiatry.  She has had toes buddy taped and reports improvement.  Her sugars have been running high - in the 400s. She has been fatigued, sluggish even after resting; she also states she has been thirsty and having urinary frequency.  Endorses compliance with her diabetic medications. Her A1c is 10.6 which is up from 9.7 previously. She started a new job which is less stressfull and she works 12 hour days with alternating days as time off.  Requests a note for work to return in 4 days She has no additional concerns today.  Past Medical History:  Diagnosis Date  . Depression   . Diabetes mellitus without complication ([place])   . Hypertension   . Migraine   . Seizures ([place])    self reported - last seizure was Oct 2019 "absent seizure"    Past Surgical History:  Procedure Laterality Date  . CHOLECYSTECTOMY    . CYST EXCISION    . SHOULDER OPEN ROTATOR CUFF REPAIR Left   . TONSILLECTOMY    . TUBAL LIGATION      Family History  Problem Relation Age of Onset  . Hypertension Mother   . Thyroid disease Mother   . Irregular heart beat Mother   . Bipolar disorder Sister   . Colon cancer Other   . Esophageal cancer Neg Hx   . Rectal cancer Neg Hx   . Stomach cancer Neg Hx     Allergies  Allergen Reactions  . Tomato Itching and Rash    Outpatient Medications Prior to Visit  Medication Sig Dispense Refill  . aspirin EC 81 MG tablet Take 1 tablet (81 mg total) by mouth daily. 30 tablet 5  . Blood Glucose Monitoring Suppl (TRUE METRIX METER) w/Device KIT USE AS DIRECTED (Patient taking  differently: 1 each by Other route as directed. ) 1 kit 0  . ferrous sulfate 325 (65 FE) MG tablet Take 1 tablet (325 mg total) by mouth 2 (two) times daily with a meal. 180 tablet 3  . glucose blood (TRUE METRIX BLOOD GLUCOSE TEST) test strip Use as instructed (Patient taking differently: 1 each by Other route as directed. ) 100 each 12  . Insulin Pen Needle (TRUEPLUS 5-BEVEL PEN NEEDLES) 32G X 4 MM MISC Use as instructed to inject insulin and Victoza daily. (Patient taking differently: 1 each by Other route See admin instructions. Use as instructed to inject insulin and Victoza daily.) 100 each 11  . lisinopril-hydrochlorothiazide (ZESTORETIC) 20-12.5 MG tablet Take 2 tablets by mouth daily. 180 tablet 1  . metFORMIN (GLUCOPHAGE) 1000 MG tablet Take 1 tablet (1,000 mg total) by mouth 2 (two) times daily with a meal. 180 tablet 1  . metoprolol tartrate (LOPRESSOR) 50 MG tablet Take 1 tablet (50 mg total) by mouth 2 (two) times daily. 60 tablet 6  . simvastatin (ZOCOR) 40 MG tablet Take 1 tablet (40 mg total) by mouth at bedtime. 30 tablet 3  . traZODone (DESYREL) 100 MG tablet Take 1 tablet (100 mg total) by mouth at bedtime as needed for sleep. 30 tablet 3  . TRUEplus Lancets 28G  MISC USE AS INSTRUCTED (Patient taking differently: 1 each by Other route as directed. ) 100 each 5  . cyclobenzaprine (FLEXERIL) 10 MG tablet Take 1 tablet (10 mg total) by mouth 2 (two) times daily as needed for muscle spasms. 20 tablet 0  . gabapentin (NEURONTIN) 300 MG capsule Take 2 capsules (600 mg total) by mouth 3 (three) times daily. 120 capsule 3  . insulin aspart (NOVOLOG) 100 UNIT/ML injection Inject 0-12 Units into the skin 3 (three) times daily. As per Sliding-Scale 30 mL 3  . insulin glargine (LANTUS SOLOSTAR) 100 UNIT/ML Solostar Pen Inject 34 Units into the skin 2 (two) times daily. 30 mL 5  . liraglutide (VICTOZA) 18 MG/3ML SOPN Inject 0.3 mLs (1.8 mg total) into the skin daily. 30 mL 6  . Clotrimazole 1 %  OINT Apply 1 each topically 2 (two) times daily. (Patient not taking: Reported on 06/27/2019) 56.7 g 0  . ibuprofen (ADVIL) 600 MG tablet Take 1 tablet (600 mg total) by mouth every 6 (six) hours as needed. (Patient not taking: Reported on 08/07/2019) 30 tablet 0  . metroNIDAZOLE (METROGEL VAGINAL) 0.75 % vaginal gel Place 1 Applicatorful vaginally at bedtime. (Patient not taking: Reported on 05/07/2019) 70 g 0  . promethazine (PHENERGAN) 25 MG tablet Take 1 tablet (25 mg total) by mouth every 6 (six) hours as needed for nausea or vomiting. (Patient not taking: Reported on 08/07/2019) 10 tablet 0  . sucralfate (CARAFATE) 1 g tablet Take 1 tablet (1 g total) by mouth 4 (four) times daily -  with meals and at bedtime. (Patient not taking: Reported on 06/27/2019) 60 tablet 0   No facility-administered medications prior to visit.     ROS Review of Systems  Constitutional: Positive for fatigue. Negative for activity change and appetite change.  HENT: Negative for congestion, sinus pressure and sore throat.   Eyes: Negative for visual disturbance.  Respiratory: Negative for cough, chest tightness, shortness of breath and wheezing.   Cardiovascular: Negative for chest pain and palpitations.  Gastrointestinal: Negative for abdominal distention, abdominal pain and constipation.  Endocrine: Positive for polyuria. Negative for polydipsia.  Genitourinary: Negative for dysuria and frequency.  Musculoskeletal: Negative for arthralgias and back pain.  Skin: Negative for rash.  Neurological: Negative for tremors, light-headedness and numbness.  Hematological: Does not bruise/bleed easily.  Psychiatric/Behavioral: Negative for agitation and behavioral problems.    Objective:  BP 139/79   Pulse 84   Ht $R'5\' 5"'iW$  (1.651 m)   Wt 212 lb (96.2 kg)   LMP 11/02/2019   SpO2 100%   BMI 35.28 kg/m   BP/Weight 12/02/2019 11/06/2019 0/38/8828  Systolic BP 003 491 791  Diastolic BP 79 75 83  Wt. (Lbs) 212 216 256  BMI  35.28 35.94 42.6      Physical Exam Constitutional:      Appearance: She is well-developed.  Neck:     Vascular: No JVD.  Cardiovascular:     Rate and Rhythm: Normal rate.     Heart sounds: Normal heart sounds. No murmur heard.   Pulmonary:     Effort: Pulmonary effort is normal.     Breath sounds: Normal breath sounds. No wheezing or rales.  Chest:     Chest wall: No tenderness.  Abdominal:     General: Bowel sounds are normal. There is no distension.     Palpations: Abdomen is soft. There is no mass.     Tenderness: There is no abdominal tenderness.  Musculoskeletal:  General: Normal range of motion.     Right lower leg: No edema.     Left lower leg: No edema.     Comments: Right second toe buddy taped  Neurological:     Mental Status: She is alert and oriented to person, place, and time.  Psychiatric:        Mood and Affect: Mood normal.    Diabetic Foot Exam - Simple   Simple Foot Form Diabetic Foot exam was performed with the following findings: Yes 12/02/2019 12:22 PM  Visual Inspection No deformities, no ulcerations, no other skin breakdown bilaterally: Yes Sensation Testing Intact to touch and monofilament testing bilaterally: Yes Pulse Check Posterior Tibialis and Dorsalis pulse intact bilaterally: Yes Comments      CMP Latest Ref Rng & Units 10/03/2019 10/02/2019 08/07/2019  Glucose 70 - 99 mg/dL 239(H) 150(H) 204(H)  BUN 6 - 20 mg/dL $Remove'7 9 14  'auzfgMx$ Creatinine 0.44 - 1.00 mg/dL 0.75 0.77 0.90  Sodium 135 - 145 mmol/L 133(L) 139 135  Potassium 3.5 - 5.1 mmol/L 3.6 4.0 4.0  Chloride 98 - 111 mmol/L 101 105 103  CO2 22 - 32 mmol/L 26 22 21(L)  Calcium 8.9 - 10.3 mg/dL 8.4(L) 9.0 8.8(L)  Total Protein 6.5 - 8.1 g/dL - - 6.7  Total Bilirubin 0.3 - 1.2 mg/dL - - 0.5  Alkaline Phos 38 - 126 U/L - - 53  AST 15 - 41 U/L - - 18  ALT 0 - 44 U/L - - 14    Lipid Panel     Component Value Date/Time   CHOL 180 08/20/2019 1113   TRIG 98 08/20/2019 1113   HDL  48 08/20/2019 1113   CHOLHDL 3.8 08/20/2019 1113   CHOLHDL 4.7 05/25/2016 1543   VLDL 37 (H) 05/25/2016 1543   LDLCALC 114 (H) 08/20/2019 1113    CBC    Component Value Date/Time   WBC 4.8 10/03/2019 1931   RBC 3.88 10/03/2019 1931   HGB 11.3 (L) 10/03/2019 1931   HCT 35.3 (L) 10/03/2019 1931   PLT 331 10/03/2019 1931   MCV 91.0 10/03/2019 1931   MCH 29.1 10/03/2019 1931   MCHC 32.0 10/03/2019 1931   RDW 13.2 10/03/2019 1931   LYMPHSABS 2.3 10/03/2019 1931   MONOABS 0.5 10/03/2019 1931   EOSABS 0.1 10/03/2019 1931   BASOSABS 0.0 10/03/2019 1931    Lab Results  Component Value Date   HGBA1C 10.6 (A) 12/02/2019    Assessment & Plan:  1. Type 2 diabetes mellitus with diabetic polyneuropathy, with long-term current use of insulin (HCC) Uncontrolled with A1c of 10.6 Farxiga added to regimen Advised to uptitrate Lantus dose by 2 units twice daily every week until blood sugars are at goal Counseled on Diabetic diet, my plate method, 254 minutes of moderate intensity exercise/week Blood sugar logs with fasting goals of 80-120 mg/dl, random of less than 180 and in the event of sugars less than 60 mg/dl or greater than 400 mg/dl encouraged to notify the clinic. Advised on the need for annual eye exams, annual foot exams, Pneumonia vaccine. - POCT glucose (manual entry) - POCT glycosylated hemoglobin (Hb A1C) - Microalbumin / creatinine urine ratio - dapagliflozin propanediol (FARXIGA) 10 MG TABS tablet; Take 1 tablet (10 mg total) by mouth daily before breakfast.  Dispense: 30 tablet; Refill: 6 - LP+Non-HDL Cholesterol - gabapentin (NEURONTIN) 300 MG capsule; Take 2 capsules (600 mg total) by mouth 3 (three) times daily.  Dispense: 120 capsule; Refill: 6 -  insulin aspart (NOVOLOG) 100 UNIT/ML injection; Inject 0-12 Units into the skin 3 (three) times daily. As per Sliding-Scale  Dispense: 30 mL; Refill: 6 - liraglutide (VICTOZA) 18 MG/3ML SOPN; Inject 1.8 mg into the skin daily.   Dispense: 30 mL; Refill: 6  2. Uncontrolled type 2 diabetes mellitus with hyperglycemia, with long-term current use of insulin ([place]) See #1 above - insulin glargine (LANTUS SOLOSTAR) 100 UNIT/ML Solostar Pen; Inject 34 Units into the skin 2 (two) times daily. Titrate up by 2 units twice daily every 4th day until blood sugars are at goal.  Maximum daily dose of 40 units twice daily  Dispense: 30 mL; Refill: 6  3. Essential hypertension Controlled Continue current regimen Counseled on blood pressure goal of less than 130/80, low-sodium, DASH diet, medication compliance, 150 minutes of moderate intensity exercise per week. Discussed medication compliance, adverse effects.   4. Need for hepatitis C screening test - HCV RNA quant rflx ultra or genotyp([facility])    Meds ordered this encounter  Medications  . dapagliflozin propanediol (FARXIGA) 10 MG TABS tablet    Sig: Take 1 tablet (10 mg total) by mouth daily before breakfast.    Dispense:  30 tablet    Refill:  6  . gabapentin (NEURONTIN) 300 MG capsule    Sig: Take 2 capsules (600 mg total) by mouth 3 (three) times daily.    Dispense:  120 capsule    Refill:  6  . insulin aspart (NOVOLOG) 100 UNIT/ML injection    Sig: Inject 0-12 Units into the skin 3 (three) times daily. As per Sliding-Scale    Dispense:  30 mL    Refill:  6  . insulin glargine (LANTUS SOLOSTAR) 100 UNIT/ML Solostar Pen    Sig: Inject 34 Units into the skin 2 (two) times daily. Titrate up by 2 units twice daily every 4th day until blood sugars are at goal.  Maximum daily dose of 40 units twice daily    Dispense:  30 mL    Refill:  6    Discontinue previous dose  . liraglutide (VICTOZA) 18 MG/3ML SOPN    Sig: Inject 1.8 mg into the skin daily.    Dispense:  30 mL    Refill:  6    Follow-up: Return in about 3 months (around 03/02/2020) for chronic disease management.       Charlott Rakes, MD, FAAFP. [facility] and [place], [place]   12/02/2019, 11:53 AM

## 2019-12-02 NOTE — Progress Notes (Signed)
Blood sugars has been elevated, states that she has been thirsty and urinating a lot.

## 2019-12-03 LAB — MICROALBUMIN / CREATININE URINE RATIO
Creatinine, Urine: 181.3 mg/dL
Microalb/Creat Ratio: 33 mg/g creat — ABNORMAL HIGH (ref 0–29)
Microalbumin, Urine: 59.8 ug/mL

## 2019-12-03 LAB — HCV RNA QUANT RFLX ULTRA OR GENOTYP: HCV Quant Baseline: NOT DETECTED IU/mL

## 2019-12-03 LAB — LP+NON-HDL CHOLESTEROL
Cholesterol, Total: 199 mg/dL (ref 100–199)
HDL: 39 mg/dL — ABNORMAL LOW (ref >39)
LDL Chol Calc (NIH): 133 mg/dL — ABNORMAL HIGH (ref 0–99)
Total Non-HDL-Chol (LDL+VLDL): 160 mg/dL — ABNORMAL HIGH (ref 0–129)
Triglycerides: 150 mg/dL — ABNORMAL HIGH (ref 0–149)
VLDL Cholesterol Cal: 27 mg/dL (ref 5–40)

## 2019-12-05 ENCOUNTER — Telehealth: Payer: Self-pay

## 2019-12-05 ENCOUNTER — Other Ambulatory Visit: Payer: Self-pay | Admitting: Family Medicine

## 2019-12-05 DIAGNOSIS — E785 Hyperlipidemia, unspecified: Secondary | ICD-10-CM

## 2019-12-05 MED ORDER — SIMVASTATIN 80 MG PO TABS: 80.0000 mg | ORAL_TABLET | Freq: Every day | ORAL | 6 refills | Status: DC

## 2019-12-05 MED FILL — SIMVASTATIN 80 MG TABLET: 80 | 30 days supply | Qty: 30 | Fill #0

## 2019-12-05 NOTE — Telephone Encounter (Signed)
Patient name and DOB has been verified Patient was informed of lab results. Patient had no questions.  

## 2019-12-05 NOTE — Telephone Encounter (Signed)
-----   Message from Hoy Register, MD sent at 12/05/2019 12:51 PM EDT ----- Cholesterol is elevated and I have increased simvastatin to 80 mg.  Please advise to adhere to a low-cholesterol diet and exercise.  Urine also shows that her kidneys are spilling out protein which can be a sign of diabetes starting to affect her kidneys and adherence to her diabetic regimen will be beneficial

## 2019-12-09 MED FILL — $novoLOG 100 UNITS/ML VIAL: 100 | 56 days supply | Qty: 20 | Fill #0

## 2020-02-03 MED FILL — ?BASAGLAR 100 UNITS/ML KWPE: 100 | 24 days supply | Qty: 24 | Fill #1

## 2020-03-03 ENCOUNTER — Ambulatory Visit: Payer: BLUE CROSS/BLUE SHIELD | Admitting: Family Medicine

## 2020-03-04 ENCOUNTER — Other Ambulatory Visit: Payer: Self-pay

## 2020-03-04 ENCOUNTER — Encounter (HOSPITAL_COMMUNITY): Payer: Self-pay | Admitting: Emergency Medicine

## 2020-03-04 ENCOUNTER — Emergency Department (HOSPITAL_COMMUNITY)
Admission: EM | Admit: 2020-03-04 | Discharge: 2020-03-04 | Disposition: A | Discharge status: Home / Self Care | Payer: PRIVATE HEALTH INSURANCE | Attending: Emergency Medicine | Admitting: Emergency Medicine

## 2020-03-04 DIAGNOSIS — Z5321 Procedure and treatment not carried out due to patient leaving prior to being seen by health care provider: Secondary | ICD-10-CM | POA: Insufficient documentation

## 2020-03-04 DIAGNOSIS — Z20822 Contact with and (suspected) exposure to covid-19: Secondary | ICD-10-CM | POA: Insufficient documentation

## 2020-03-04 LAB — RESP PANEL BY RT-PCR (FLU A&B, COVID) ARPGX2
Influenza A by PCR: NEGATIVE
Influenza B by PCR: NEGATIVE
SARS Coronavirus 2 by RT PCR: NEGATIVE

## 2020-03-04 NOTE — ED Triage Notes (Signed)
Pt exposed to significant other who has COVID.

## 2020-03-04 NOTE — ED Notes (Signed)
Called for vitals x3, no answer. 

## 2020-04-12 ENCOUNTER — Encounter (HOSPITAL_COMMUNITY): Payer: Self-pay | Admitting: Emergency Medicine

## 2020-04-12 ENCOUNTER — Emergency Department (HOSPITAL_COMMUNITY)
Admission: EM | Admit: 2020-04-12 | Discharge: 2020-04-12 | Disposition: A | Discharge status: Home / Self Care | Payer: Managed Care, Other (non HMO) | Attending: Emergency Medicine | Admitting: Emergency Medicine

## 2020-04-12 ENCOUNTER — Other Ambulatory Visit: Payer: Self-pay

## 2020-04-12 DIAGNOSIS — E1140 Type 2 diabetes mellitus with diabetic neuropathy, unspecified: Secondary | ICD-10-CM | POA: Insufficient documentation

## 2020-04-12 DIAGNOSIS — Z7982 Long term (current) use of aspirin: Secondary | ICD-10-CM | POA: Diagnosis not present

## 2020-04-12 DIAGNOSIS — I10 Essential (primary) hypertension: Secondary | ICD-10-CM | POA: Diagnosis not present

## 2020-04-12 DIAGNOSIS — Z20822 Contact with and (suspected) exposure to covid-19: Secondary | ICD-10-CM | POA: Diagnosis not present

## 2020-04-12 DIAGNOSIS — Z79899 Other long term (current) drug therapy: Secondary | ICD-10-CM | POA: Diagnosis not present

## 2020-04-12 DIAGNOSIS — Z87891 Personal history of nicotine dependence: Secondary | ICD-10-CM | POA: Diagnosis not present

## 2020-04-12 DIAGNOSIS — Z794 Long term (current) use of insulin: Secondary | ICD-10-CM | POA: Diagnosis not present

## 2020-04-12 DIAGNOSIS — J029 Acute pharyngitis, unspecified: Secondary | ICD-10-CM | POA: Insufficient documentation

## 2020-04-12 LAB — SARS CORONAVIRUS 2 BY RT PCR (HOSPITAL ORDER, PERFORMED IN ~~LOC~~ HOSPITAL LAB): SARS Coronavirus 2: NEGATIVE

## 2020-04-12 LAB — GROUP A STREP BY PCR: Group A Strep by PCR: NOT DETECTED

## 2020-04-12 NOTE — ED Provider Notes (Signed)
[facility] EMERGENCY DEPARTMENT Provider Note   CSN: [id number] Arrival date & time: 04/12/20  6195     History Chief Complaint  Patient presents with  . Covid Exposure  . Headache  . Sore Throat    Mariah Miller is a 45 y.o. female.  The history is provided by the patient. No language interpreter was used.  Sore Throat This is a new problem. The current episode started 2 days ago. The problem occurs constantly. The problem has been gradually worsening. Nothing aggravates the symptoms. Nothing relieves the symptoms. She has tried nothing for the symptoms. The treatment provided no relief.  Pt exposed to covid 5 days ago.  Pt reports she has a sore throat.  Pt requested a covid test and a strep test     Past Medical History:  Diagnosis Date  . Depression   . Diabetes mellitus without complication ([place])   . Hypertension   . Migraine   . Seizures ([place])    self reported - last seizure was Oct 2019 "absent seizure"    Patient Active Problem List   Diagnosis Date Noted  . Eczema 05/26/2017  . Primary insomnia 09/28/2016  . Anxiety and depression 05/26/2016  . OSA (obstructive sleep apnea) 04/30/2015  . Chest pain 04/28/2015  . DM2 (diabetes mellitus, type 2) ([place]) 04/28/2015  . ASCUS with positive high risk HPV cervical 06/06/2014  . Anemia, iron deficiency 03/17/2014  . Dyslipidemia 03/17/2014  . Essential hypertension 03/17/2014  . Uncontrolled diabetes mellitus ([place]) 06/17/2013  . Hypoglycemia 04/08/2013  . Diabetic neuropathy, painful ([place]) 03/21/2013  . Other and unspecified hyperlipidemia 01/22/2013  . A C DEGENERATION-CHRONIC 10/27/2008  . SHOULDER PAIN 05/01/2008  . IMPINGEMENT SYNDROME 05/01/2008    Past Surgical History:  Procedure Laterality Date  . CHOLECYSTECTOMY    . CYST EXCISION    . SHOULDER OPEN ROTATOR CUFF REPAIR Left   . TONSILLECTOMY    . TUBAL LIGATION       OB History    Gravida  6   Para  3   Term  3    Preterm      AB  3   Living  3     SAB  2   IAB      Ectopic      Multiple      Live Births  3           Family History  Problem Relation Age of Onset  . Hypertension Mother   . Thyroid disease Mother   . Irregular heart beat Mother   . Bipolar disorder Sister   . Colon cancer Other   . Esophageal cancer Neg Hx   . Rectal cancer Neg Hx   . Stomach cancer Neg Hx     Social History   Tobacco Use  . Smoking status: Former Smoker    Types: Cigarettes  . Smokeless tobacco: Never Used  . Tobacco comment: Smoked for 4 years, quit age 50  Vaping Use  . Vaping Use: Never used  Substance Use Topics  . Alcohol use: Yes    Alcohol/week: 0.0 standard drinks    Comment: Holidays only  . Drug use: No    Home Medications Prior to Admission medications   Medication Sig Start Date End Date Taking? Authorizing Provider  aspirin EC 81 MG tablet Take 1 tablet (81 mg total) by mouth daily. 01/22/13   Dhungel, Flonnie Overman, MD  Blood Glucose Monitoring Suppl (TRUE METRIX METER) w/Device  KIT USE AS DIRECTED Patient taking differently: 1 each by Other route as directed.  05/16/18   Charlott Rakes, MD  Clotrimazole 1 % OINT Apply 1 each topically 2 (two) times daily. Patient not taking: Reported on 06/27/2019 02/04/19   Charlott Rakes, MD  dapagliflozin propanediol (FARXIGA) 10 MG TABS tablet Take 1 tablet (10 mg total) by mouth daily before breakfast. 12/02/19   Charlott Rakes, MD  ferrous sulfate 325 (65 FE) MG tablet Take 1 tablet (325 mg total) by mouth 2 (two) times daily with a meal. 04/24/17   Charlott Rakes, MD  gabapentin (NEURONTIN) 300 MG capsule Take 2 capsules (600 mg total) by mouth 3 (three) times daily. 12/02/19   Charlott Rakes, MD  glucose blood (TRUE METRIX BLOOD GLUCOSE TEST) test strip Use as instructed Patient taking differently: 1 each by Other route as directed.  05/16/18   Charlott Rakes, MD  ibuprofen (ADVIL) 600 MG tablet Take 1 tablet (600 mg total) by  mouth every 6 (six) hours as needed. Patient not taking: Reported on 08/07/2019 12/26/18   Domenic Moras, PA-C  insulin aspart (NOVOLOG) 100 UNIT/ML injection Inject 0-12 Units into the skin 3 (three) times daily. As per Sliding-Scale 12/02/19   Charlott Rakes, MD  insulin glargine (LANTUS SOLOSTAR) 100 UNIT/ML Solostar Pen Inject 34 Units into the skin 2 (two) times daily. Titrate up by 2 units twice daily every 4th day until blood sugars are at goal.  Maximum daily dose of 40 units twice daily 12/02/19   Charlott Rakes, MD  Insulin Pen Needle (TRUEPLUS 5-BEVEL PEN NEEDLES) 32G X 4 MM MISC Use as instructed to inject insulin and Victoza daily. Patient taking differently: 1 each by Other route See admin instructions. Use as instructed to inject insulin and Victoza daily. 03/07/19   Charlott Rakes, MD  liraglutide (VICTOZA) 18 MG/3ML SOPN Inject 1.8 mg into the skin daily. 12/02/19   Charlott Rakes, MD  lisinopril-hydrochlorothiazide (ZESTORETIC) 20-12.5 MG tablet Take 2 tablets by mouth daily. 08/20/19   Charlott Rakes, MD  metFORMIN (GLUCOPHAGE) 1000 MG tablet Take 1 tablet (1,000 mg total) by mouth 2 (two) times daily with a meal. 08/20/19   Charlott Rakes, MD  metoprolol tartrate (LOPRESSOR) 50 MG tablet Take 1 tablet (50 mg total) by mouth 2 (two) times daily. 08/20/19   Charlott Rakes, MD  metroNIDAZOLE (METROGEL VAGINAL) 0.75 % vaginal gel Place 1 Applicatorful vaginally at bedtime. Patient not taking: Reported on 05/07/2019 02/05/19   Charlott Rakes, MD  promethazine (PHENERGAN) 25 MG tablet Take 1 tablet (25 mg total) by mouth every 6 (six) hours as needed for nausea or vomiting. Patient not taking: Reported on 08/07/2019 06/27/19   Dalia Heading, PA-C  simvastatin (ZOCOR) 80 MG tablet Take 1 tablet (80 mg total) by mouth at bedtime. 12/05/19   Charlott Rakes, MD  sucralfate (CARAFATE) 1 g tablet Take 1 tablet (1 g total) by mouth 4 (four) times daily -  with meals and at bedtime. Patient not  taking: Reported on 06/27/2019 06/18/18   Muthersbaugh, Jarrett Soho, PA-C  traZODone (DESYREL) 100 MG tablet Take 1 tablet (100 mg total) by mouth at bedtime as needed for sleep. 08/20/19   Charlott Rakes, MD  TRUEplus Lancets 28G MISC USE AS INSTRUCTED Patient taking differently: 1 each by Other route as directed.  05/16/18   Charlott Rakes, MD    Allergies    Tomato  Review of Systems   Review of Systems  All other systems reviewed and are negative.  Physical Exam Updated Vital Signs BP (!) 153/83 (BP Location: Left Arm)   Pulse 77   Temp 98.4 F (36.9 C) (Oral)   Resp 20   LMP 04/11/2020   SpO2 100%   Physical Exam Vitals and nursing note reviewed.  Constitutional:      Appearance: She is well-developed and well-nourished.  HENT:     Head: Normocephalic.     Mouth/Throat:     Mouth: Mucous membranes are moist.  Eyes:     Extraocular Movements: Extraocular movements intact and EOM normal.  Cardiovascular:     Rate and Rhythm: Normal rate and regular rhythm.     Heart sounds: Normal heart sounds.  Pulmonary:     Effort: Pulmonary effort is normal.  Musculoskeletal:        General: Normal range of motion.     Cervical back: Normal range of motion.  Skin:    General: Skin is warm.  Neurological:     Mental Status: She is alert and oriented to person, place, and time.  Psychiatric:        Mood and Affect: Mood and affect and mood normal.     ED Results / Procedures / Treatments   Labs (all labs ordered are listed, but only abnormal results are displayed) Labs Reviewed  SARS CORONAVIRUS 2 BY RT PCR (HOSPITAL ORDER, [facility])  GROUP A STREP BY PCR    EKG None  Radiology No results found.  Procedures Procedures   Medications Ordered in ED Medications - No data to display  ED Course  I have reviewed the triage vital signs and the nursing notes.  Pertinent labs & imaging results that were available during my care of the patient  were reviewed by me and considered in my medical decision making (see chart for details).    MDM Rules/Calculators/A&P                          MDM:  Strep and covid are negative.  Pt given note for work  Final Clinical Impression(s) / ED Diagnoses Final diagnoses:  Pharyngitis, unspecified etiology    Rx / DC Orders ED Discharge Orders    None    An After Visit Summary was printed and given to the patient.    Sidney Ace 04/12/20 1535    Blanchie Dessert, MD 04/12/20 2050

## 2020-04-12 NOTE — ED Notes (Signed)
Patient verbalizes understanding of discharge instructions. Opportunity for questioning and answers were provided. Armband removed by staff, pt discharged from ED.  

## 2020-04-12 NOTE — ED Triage Notes (Signed)
Pt reports + COVID exposure on Monday.  Reports headache and sore throat since Thursday.  States her job sent her here for a COVID test.

## 2020-04-21 ENCOUNTER — Other Ambulatory Visit: Payer: Self-pay | Admitting: Family Medicine

## 2020-04-21 ENCOUNTER — Other Ambulatory Visit: Payer: Self-pay | Admitting: Pharmacist

## 2020-04-21 MED ORDER — "INSULIN SYRINGE-NEEDLE U-100 31G X 5/16"" 0.3 ML MISC": 0 refills | Status: DC

## 2020-04-21 MED ORDER — TRUEPLUS 5-BEVEL PEN NEEDLES 32G X 4 MM MISC: 0 refills | Status: DC

## 2020-04-21 MED FILL — TRUEPLUS PEN NDL 32GX5/32: 32G X 4 MM | 30 days supply | Qty: 100 | Fill #0

## 2020-04-21 MED FILL — ?BASAGLAR 100 UNITS/ML KWPE: 100 | 24 days supply | Qty: 24 | Fill #2

## 2020-04-21 MED FILL — TRUEPLUS SYR 0.3ML 31GX5/16: 31G X 5/16" | 30 days supply | Qty: 100 | Fill #0

## 2020-04-21 MED FILL — $novoLOG 100 UNITS/ML VIAL: 100 | 83 days supply | Qty: 30 | Fill #3

## 2020-04-22 ENCOUNTER — Ambulatory Visit: Payer: Self-pay | Attending: Family Medicine | Admitting: Family Medicine

## 2020-04-22 ENCOUNTER — Other Ambulatory Visit: Payer: Self-pay

## 2020-04-22 ENCOUNTER — Other Ambulatory Visit: Payer: Self-pay | Admitting: Family Medicine

## 2020-04-22 ENCOUNTER — Encounter: Payer: Self-pay | Admitting: Family Medicine

## 2020-04-22 VITALS — BP 135/75 | HR 72 | Ht 65.0 in | Wt 222.2 lb

## 2020-04-22 DIAGNOSIS — Z0000 Encounter for general adult medical examination without abnormal findings: Secondary | ICD-10-CM

## 2020-04-22 DIAGNOSIS — Z124 Encounter for screening for malignant neoplasm of cervix: Secondary | ICD-10-CM

## 2020-04-22 DIAGNOSIS — Z1231 Encounter for screening mammogram for malignant neoplasm of breast: Secondary | ICD-10-CM

## 2020-04-22 DIAGNOSIS — Z113 Encounter for screening for infections with a predominantly sexual mode of transmission: Secondary | ICD-10-CM

## 2020-04-22 DIAGNOSIS — I10 Essential (primary) hypertension: Secondary | ICD-10-CM

## 2020-04-22 MED ORDER — METOPROLOL TARTRATE 50 MG PO TABS: 50.0000 mg | ORAL_TABLET | Freq: Two times a day (BID) | ORAL | 6 refills | Status: DC

## 2020-04-22 MED ORDER — LISINOPRIL-HYDROCHLOROTHIAZIDE 20-12.5 MG PO TABS: 2.0000 | ORAL_TABLET | Freq: Every day | ORAL | 1 refills | Status: DC

## 2020-04-22 MED FILL — METOPROLOL TARTRATE 50 MG T: 50 | 30 days supply | Qty: 60 | Fill #0

## 2020-04-22 MED FILL — LISINOPRIL-HCTZ 20-12.5 MG: 20-12.5 | 30 days supply | Qty: 60 | Fill #0

## 2020-04-22 NOTE — Progress Notes (Signed)
Subjective:  Patient ID: Mariah Miller, female    DOB: [birth year]  Age: 45 y.o. MRN: [id number]  CC: Gynecologic Exam   HPI Mariah Miller is a 45 year old female with a history of type 2 diabetes mellitus (A1c10.6), hypertension, depression who presents today for a gynecological exam. She is due for Pap smear, mammogram.  Also requests to have STD testing. She is requesting refill of her blood pressure medications. For chronic disease management she was last seen in 11/2019 and advised to return in 3 months for follow-up but negative.  Past Medical History:  Diagnosis Date  . Depression   . Diabetes mellitus without complication ([place])   . Hypertension   . Migraine   . Seizures ([place])    self reported - last seizure was Oct 2019 "absent seizure"    Past Surgical History:  Procedure Laterality Date  . CHOLECYSTECTOMY    . CYST EXCISION    . SHOULDER OPEN ROTATOR CUFF REPAIR Left   . TONSILLECTOMY    . TUBAL LIGATION      Family History  Problem Relation Age of Onset  . Hypertension Mother   . Thyroid disease Mother   . Irregular heart beat Mother   . Bipolar disorder Sister   . Colon cancer Other   . Esophageal cancer Neg Hx   . Rectal cancer Neg Hx   . Stomach cancer Neg Hx     Allergies  Allergen Reactions  . Tomato Itching and Rash    Outpatient Medications Prior to Visit  Medication Sig Dispense Refill  . aspirin EC 81 MG tablet Take 1 tablet (81 mg total) by mouth daily. 30 tablet 5  . Blood Glucose Monitoring Suppl (TRUE METRIX METER) w/Device KIT USE AS DIRECTED (Patient taking differently: 1 each by Other route as directed.) 1 kit 0  . dapagliflozin propanediol (FARXIGA) 10 MG TABS tablet Take 1 tablet (10 mg total) by mouth daily before breakfast. 30 tablet 6  . ferrous sulfate 325 (65 FE) MG tablet Take 1 tablet (325 mg total) by mouth 2 (two) times daily with a meal. 180 tablet 3  . gabapentin (NEURONTIN) 300 MG capsule Take 2 capsules (600  mg total) by mouth 3 (three) times daily. 120 capsule 6  . glucose blood (TRUE METRIX BLOOD GLUCOSE TEST) test strip Use as instructed (Patient taking differently: 1 each by Other route as directed.) 100 each 12  . insulin aspart (NOVOLOG) 100 UNIT/ML injection Inject 0-12 Units into the skin 3 (three) times daily. As per Sliding-Scale 30 mL 6  . insulin glargine (LANTUS SOLOSTAR) 100 UNIT/ML Solostar Pen Inject 34 Units into the skin 2 (two) times daily. Titrate up by 2 units twice daily every 4th day until blood sugars are at goal.  Maximum daily dose of 40 units twice daily 30 mL 6  . Insulin Pen Needle (TRUEPLUS 5-BEVEL PEN NEEDLES) 32G X 4 MM MISC Use as instructed to inject insulin and Victoza daily. 100 each 0  . Insulin Syringe-Needle U-100 (TRUEPLUS INSULIN SYRINGE) 31G X 5/16" 0.3 ML MISC Use to inject insulin Novolog TID. 100 each 0  . liraglutide (VICTOZA) 18 MG/3ML SOPN Inject 1.8 mg into the skin daily. 30 mL 6  . metFORMIN (GLUCOPHAGE) 1000 MG tablet Take 1 tablet (1,000 mg total) by mouth 2 (two) times daily with a meal. 180 tablet 1  . simvastatin (ZOCOR) 80 MG tablet Take 1 tablet (80 mg total) by mouth at bedtime. 30 tablet 6  .  traZODone (DESYREL) 100 MG tablet Take 1 tablet (100 mg total) by mouth at bedtime as needed for sleep. 30 tablet 3  . TRUEplus Lancets 28G MISC USE AS INSTRUCTED (Patient taking differently: 1 each by Other route as directed.) 100 each 5  . lisinopril-hydrochlorothiazide (ZESTORETIC) 20-12.5 MG tablet Take 2 tablets by mouth daily. 180 tablet 1  . metoprolol tartrate (LOPRESSOR) 50 MG tablet Take 1 tablet (50 mg total) by mouth 2 (two) times daily. 60 tablet 6  . Clotrimazole 1 % OINT Apply 1 each topically 2 (two) times daily. (Patient not taking: No sig reported) 56.7 g 0  . ibuprofen (ADVIL) 600 MG tablet Take 1 tablet (600 mg total) by mouth every 6 (six) hours as needed. (Patient not taking: No sig reported) 30 tablet 0  . metroNIDAZOLE (METROGEL  VAGINAL) 0.75 % vaginal gel Place 1 Applicatorful vaginally at bedtime. (Patient not taking: No sig reported) 70 g 0  . promethazine (PHENERGAN) 25 MG tablet Take 1 tablet (25 mg total) by mouth every 6 (six) hours as needed for nausea or vomiting. (Patient not taking: No sig reported) 10 tablet 0  . sucralfate (CARAFATE) 1 g tablet Take 1 tablet (1 g total) by mouth 4 (four) times daily -  with meals and at bedtime. (Patient not taking: No sig reported) 60 tablet 0   No facility-administered medications prior to visit.     ROS Review of Systems  Constitutional: Negative for activity change, appetite change and fatigue.  HENT: Negative for congestion, sinus pressure and sore throat.   Eyes: Negative for visual disturbance.  Respiratory: Negative for cough, chest tightness, shortness of breath and wheezing.   Cardiovascular: Negative for chest pain and palpitations.  Gastrointestinal: Negative for abdominal distention, abdominal pain and constipation.  Endocrine: Negative for polydipsia.  Genitourinary: Negative for dysuria and frequency.  Musculoskeletal: Negative for arthralgias and back pain.  Skin: Negative for rash.  Neurological: Negative for tremors, light-headedness and numbness.  Hematological: Does not bruise/bleed easily.  Psychiatric/Behavioral: Negative for agitation and behavioral problems.    Objective:  BP 135/75   Pulse 72   Ht $R'5\' 5"'Ul$  (1.651 m)   Wt 222 lb 3.2 oz (100.8 kg)   LMP 04/11/2020   SpO2 100%   BMI 36.98 kg/m   BP/Weight 04/22/2020 04/12/2020 96/28/3662  Systolic BP 947 654 650  Diastolic BP 75 83 81  Wt. (Lbs) 222.2 - 213.85  BMI 36.98 - 35.59      Physical Exam Constitutional:      General: She is not in acute distress.    Appearance: She is well-developed and well-nourished. She is not diaphoretic.  HENT:     Head: Normocephalic.     Right Ear: External ear normal.     Left Ear: External ear normal.     Nose: Nose normal.     Mouth/Throat:      Mouth: Oropharynx is clear and moist.  Eyes:     Extraocular Movements: EOM normal.     Conjunctiva/sclera: Conjunctivae normal.     Pupils: Pupils are equal, round, and reactive to light.  Neck:     Vascular: No JVD.  Cardiovascular:     Rate and Rhythm: Normal rate and regular rhythm.     Pulses: Intact distal pulses.     Heart sounds: Normal heart sounds. No murmur heard. No gallop.   Pulmonary:     Effort: Pulmonary effort is normal. No respiratory distress.     Breath sounds: Normal breath  sounds. No wheezing or rales.  Chest:     Chest wall: No tenderness.  Breasts:     Right: No mass, tenderness, axillary adenopathy or supraclavicular adenopathy.     Left: No mass, tenderness, axillary adenopathy or supraclavicular adenopathy.    Abdominal:     General: Bowel sounds are normal. There is no distension.     Palpations: Abdomen is soft. There is no mass.     Tenderness: There is no abdominal tenderness.  Genitourinary:    Comments: External genitalia, vagina - normal Cervix with stage 1 prolapse, Musculoskeletal:        General: No tenderness or edema. Normal range of motion.     Cervical back: Normal range of motion.  Lymphadenopathy:     Upper Body:     Right upper body: No supraclavicular, axillary or pectoral adenopathy.     Left upper body: No supraclavicular, axillary or pectoral adenopathy.  Skin:    General: Skin is warm and dry.  Neurological:     Mental Status: She is alert and oriented to person, place, and time.     Deep Tendon Reflexes: Reflexes are normal and symmetric.  Psychiatric:        Mood and Affect: Mood and affect normal.     CMP Latest Ref Rng & Units 10/03/2019 10/02/2019 08/07/2019  Glucose 70 - 99 mg/dL 239(H) 150(H) 204(H)  BUN 6 - 20 mg/dL $Remove'7 9 14  'ogBQiKT$ Creatinine 0.44 - 1.00 mg/dL 0.75 0.77 0.90  Sodium 135 - 145 mmol/L 133(L) 139 135  Potassium 3.5 - 5.1 mmol/L 3.6 4.0 4.0  Chloride 98 - 111 mmol/L 101 105 103  CO2 22 - 32 mmol/L 26  22 21(L)  Calcium 8.9 - 10.3 mg/dL 8.4(L) 9.0 8.8(L)  Total Protein 6.5 - 8.1 g/dL - - 6.7  Total Bilirubin 0.3 - 1.2 mg/dL - - 0.5  Alkaline Phos 38 - 126 U/L - - 53  AST 15 - 41 U/L - - 18  ALT 0 - 44 U/L - - 14    Lipid Panel     Component Value Date/Time   CHOL 199 12/02/2019 1205   TRIG 150 (H) 12/02/2019 1205   HDL 39 (L) 12/02/2019 1205   CHOLHDL 3.8 08/20/2019 1113   CHOLHDL 4.7 05/25/2016 1543   VLDL 37 (H) 05/25/2016 1543   LDLCALC 133 (H) 12/02/2019 1205    CBC    Component Value Date/Time   WBC 4.8 10/03/2019 1931   RBC 3.88 10/03/2019 1931   HGB 11.3 (L) 10/03/2019 1931   HCT 35.3 (L) 10/03/2019 1931   PLT 331 10/03/2019 1931   MCV 91.0 10/03/2019 1931   MCH 29.1 10/03/2019 1931   MCHC 32.0 10/03/2019 1931   RDW 13.2 10/03/2019 1931   LYMPHSABS 2.3 10/03/2019 1931   MONOABS 0.5 10/03/2019 1931   EOSABS 0.1 10/03/2019 1931   BASOSABS 0.0 10/03/2019 1931    Lab Results  Component Value Date   HGBA1C 10.6 (A) 12/02/2019    Assessment & Plan:  1. Annual physical exam Counseled on 150 minutes of exercise per week, healthy eating (including decreased daily intake of saturated fats, cholesterol, added sugars, sodium),routine healthcare maintenance. - CMP14+EGFR - Lipid panel - Microalbumin / creatinine urine ratio  2. Screening for cervical cancer - Cytology - PAP([facility])  3. Encounter for screening mammogram for malignant neoplasm of breast - MM 3D SCREEN BREAST BILATERAL; Future  4. Screening for STD (sexually transmitted disease) - Cervicovaginal ancillary only  5. Essential hypertension - lisinopril-hydrochlorothiazide (ZESTORETIC) 20-12.5 MG tablet; Take 2 tablets by mouth daily.  Dispense: 180 tablet; Refill: 1 - metoprolol tartrate (LOPRESSOR) 50 MG tablet; Take 1 tablet (50 mg total) by mouth 2 (two) times daily.  Dispense: 60 tablet; Refill: 6    Meds ordered this encounter  Medications  . lisinopril-hydrochlorothiazide  (ZESTORETIC) 20-12.5 MG tablet    Sig: Take 2 tablets by mouth daily.    Dispense:  180 tablet    Refill:  1  . metoprolol tartrate (LOPRESSOR) 50 MG tablet    Sig: Take 1 tablet (50 mg total) by mouth 2 (two) times daily.    Dispense:  60 tablet    Refill:  6    Follow-up: Return in about 3 weeks (around 05/13/2020) for Diabetes mellitus.       Charlott Rakes, MD, FAAFP. [facility], [place]   04/22/2020, 11:27 AM

## 2020-04-23 ENCOUNTER — Other Ambulatory Visit: Payer: Self-pay | Admitting: Family Medicine

## 2020-04-23 LAB — CYTOLOGY - PAP
Comment: NEGATIVE
Diagnosis: NEGATIVE
High risk HPV: NEGATIVE

## 2020-04-23 LAB — CMP14+EGFR
ALT: 11 IU/L (ref 0–32)
AST: 14 IU/L (ref 0–40)
Albumin/Globulin Ratio: 1.3 (ref 1.2–2.2)
Albumin: 3.8 g/dL (ref 3.8–4.8)
Alkaline Phosphatase: 78 IU/L (ref 44–121)
BUN/Creatinine Ratio: 10 (ref 9–23)
BUN: 9 mg/dL (ref 6–24)
Bilirubin Total: 0.2 mg/dL (ref 0.0–1.2)
CO2: 22 mmol/L (ref 20–29)
Calcium: 9.1 mg/dL (ref 8.7–10.2)
Chloride: 103 mmol/L (ref 96–106)
Creatinine, Ser: 0.86 mg/dL (ref 0.57–1.00)
GFR calc Af Amer: 96 mL/min/{1.73_m2} (ref >59)
GFR calc non Af Amer: 83 mL/min/{1.73_m2} (ref >59)
Globulin, Total: 3 g/dL (ref 1.5–4.5)
Glucose: 100 mg/dL — ABNORMAL HIGH (ref 65–99)
Potassium: 4.1 mmol/L (ref 3.5–5.2)
Sodium: 138 mmol/L (ref 134–144)
Total Protein: 6.8 g/dL (ref 6.0–8.5)

## 2020-04-23 LAB — LIPID PANEL
Chol/HDL Ratio: 4.8 ratio — ABNORMAL HIGH (ref 0.0–4.4)
Cholesterol, Total: 190 mg/dL (ref 100–199)
HDL: 40 mg/dL (ref >39)
LDL Chol Calc (NIH): 129 mg/dL — ABNORMAL HIGH (ref 0–99)
Triglycerides: 118 mg/dL (ref 0–149)
VLDL Cholesterol Cal: 21 mg/dL (ref 5–40)

## 2020-04-23 LAB — CERVICOVAGINAL ANCILLARY ONLY
Bacterial Vaginitis (gardnerella): POSITIVE — AB
Candida Glabrata: NEGATIVE
Candida Vaginitis: NEGATIVE
Chlamydia: NEGATIVE
Comment: NEGATIVE
Comment: NEGATIVE
Comment: NEGATIVE
Comment: NEGATIVE
Comment: NEGATIVE
Comment: NORMAL
Neisseria Gonorrhea: NEGATIVE
Trichomonas: NEGATIVE

## 2020-04-23 MED ORDER — METRONIDAZOLE 0.75 % VA GEL
1.0000 | Freq: Every day | VAGINAL | 0 refills | Status: DC
Start: 2020-04-23 — End: 2020-04-23

## 2020-04-24 MED FILL — METRONIDAZOLE 0.75 % GEL: 0.75 | 5 days supply | Qty: 70 | Fill #0

## 2020-05-06 ENCOUNTER — Ambulatory Visit: Payer: No Typology Code available for payment source | Admitting: Family Medicine

## 2020-05-26 ENCOUNTER — Ambulatory Visit: Payer: PRIVATE HEALTH INSURANCE | Admitting: Family Medicine

## 2020-06-30 MED FILL — Insulin Glargine Soln Pen-Injector 100 Unit/ML: SUBCUTANEOUS | 30 days supply | Qty: 30 | Fill #0 | Status: AC

## 2020-07-01 ENCOUNTER — Other Ambulatory Visit: Payer: Self-pay

## 2020-07-03 ENCOUNTER — Other Ambulatory Visit: Payer: Self-pay

## 2020-07-15 ENCOUNTER — Ambulatory Visit: Payer: Self-pay | Admitting: Family Medicine

## 2020-08-19 ENCOUNTER — Other Ambulatory Visit: Payer: Self-pay

## 2020-08-20 ENCOUNTER — Emergency Department (HOSPITAL_COMMUNITY)
Admission: EM | Admit: 2020-08-20 | Discharge: 2020-08-21 | Disposition: A | Discharge status: Home / Self Care | Payer: Managed Care, Other (non HMO) | Attending: Emergency Medicine | Admitting: Emergency Medicine

## 2020-08-20 ENCOUNTER — Ambulatory Visit: Payer: Self-pay | Admitting: Physician Assistant

## 2020-08-20 DIAGNOSIS — E1165 Type 2 diabetes mellitus with hyperglycemia: Secondary | ICD-10-CM | POA: Diagnosis not present

## 2020-08-20 DIAGNOSIS — Z79899 Other long term (current) drug therapy: Secondary | ICD-10-CM | POA: Insufficient documentation

## 2020-08-20 DIAGNOSIS — I10 Essential (primary) hypertension: Secondary | ICD-10-CM | POA: Diagnosis not present

## 2020-08-20 DIAGNOSIS — Z7984 Long term (current) use of oral hypoglycemic drugs: Secondary | ICD-10-CM | POA: Diagnosis not present

## 2020-08-20 DIAGNOSIS — R55 Syncope and collapse: Secondary | ICD-10-CM | POA: Insufficient documentation

## 2020-08-20 DIAGNOSIS — Z794 Long term (current) use of insulin: Secondary | ICD-10-CM | POA: Insufficient documentation

## 2020-08-20 DIAGNOSIS — Z7982 Long term (current) use of aspirin: Secondary | ICD-10-CM | POA: Diagnosis not present

## 2020-08-20 DIAGNOSIS — R519 Headache, unspecified: Secondary | ICD-10-CM | POA: Diagnosis not present

## 2020-08-20 DIAGNOSIS — E1140 Type 2 diabetes mellitus with diabetic neuropathy, unspecified: Secondary | ICD-10-CM | POA: Diagnosis not present

## 2020-08-20 DIAGNOSIS — R110 Nausea: Secondary | ICD-10-CM | POA: Diagnosis present

## 2020-08-20 DIAGNOSIS — Z87891 Personal history of nicotine dependence: Secondary | ICD-10-CM | POA: Insufficient documentation

## 2020-08-20 DIAGNOSIS — R739 Hyperglycemia, unspecified: Secondary | ICD-10-CM

## 2020-08-20 LAB — CBC WITH DIFFERENTIAL/PLATELET
Abs Immature Granulocytes: 0.02 10*3/uL (ref 0.00–0.07)
Basophils Absolute: 0 10*3/uL (ref 0.0–0.1)
Basophils Relative: 1 %
Eosinophils Absolute: 0.1 10*3/uL (ref 0.0–0.5)
Eosinophils Relative: 2 %
HCT: 35.4 % — ABNORMAL LOW (ref 36.0–46.0)
Hemoglobin: 11.4 g/dL — ABNORMAL LOW (ref 12.0–15.0)
Immature Granulocytes: 0 %
Lymphocytes Relative: 48 %
Lymphs Abs: 3.3 10*3/uL (ref 0.7–4.0)
MCH: 29.7 pg (ref 26.0–34.0)
MCHC: 32.2 g/dL (ref 30.0–36.0)
MCV: 92.2 fL (ref 80.0–100.0)
Monocytes Absolute: 0.8 10*3/uL (ref 0.1–1.0)
Monocytes Relative: 11 %
Neutro Abs: 2.6 10*3/uL (ref 1.7–7.7)
Neutrophils Relative %: 38 %
Platelets: 358 10*3/uL (ref 150–400)
RBC: 3.84 MIL/uL — ABNORMAL LOW (ref 3.87–5.11)
RDW: 13.2 % (ref 11.5–15.5)
WBC: 6.9 10*3/uL (ref 4.0–10.5)
nRBC: 0 % (ref 0.0–0.2)

## 2020-08-20 LAB — BASIC METABOLIC PANEL
Anion gap: 5 (ref 5–15)
BUN: 7 mg/dL (ref 6–20)
CO2: 26 mmol/L (ref 22–32)
Calcium: 8.7 mg/dL — ABNORMAL LOW (ref 8.9–10.3)
Chloride: 107 mmol/L (ref 98–111)
Creatinine, Ser: 0.79 mg/dL (ref 0.44–1.00)
GFR, Estimated: >60 mL/min (ref >60)
Glucose, Bld: 119 mg/dL — ABNORMAL HIGH (ref 70–99)
Potassium: 3.8 mmol/L (ref 3.5–5.1)
Sodium: 138 mmol/L (ref 135–145)

## 2020-08-20 LAB — I-STAT BETA HCG BLOOD, ED (MC, WL, AP ONLY): I-stat hCG, quantitative: <5 m[IU]/mL (ref <5)

## 2020-08-20 MED ORDER — IBUPROFEN 200 MG PO TABS: 600.0000 mg | ORAL_TABLET | Freq: Once | ORAL | Status: DC

## 2020-08-20 NOTE — ED Triage Notes (Addendum)
Pt c/o migraines, blurred vision/dizziness & constant thirst x1 day, pt unsure if related to working conditions as she works in a "very hot" work environment. Hx diabetes, states she isn't sure of hydration status. Pt states similar episodes "years ago," found she suffered from "real bad migraines." Pt endorses slight photosensitivity

## 2020-08-20 NOTE — ED Provider Notes (Signed)
Emergency Medicine Provider Triage Evaluation Note  Mariah Miller , a 45 y.o. female  was evaluated in triage.  Pt complains of headache consistent with previous headaches, intermittent lightheadedness starting last night. Thinks she may be dehydrated.  Review of Systems  Positive: Headache, lightheadedness Negative: Syncope, neuro deficits, CP, SOB  Physical Exam  BP 131/72 (BP Location: Left Arm)   Pulse 85   Temp 98.2 F (36.8 C) (Oral)   Resp 16   SpO2 100%  Gen:   Awake, no distress   Resp:  Normal effort  MSK:   Moves extremities without difficulty  Other:    Medical Decision Making  Medically screening exam initiated at 6:26 PM.  Appropriate orders placed.  Mariah Miller was informed that the remainder of the evaluation will be completed by another provider, this initial triage assessment does not replace that evaluation, and the importance of remaining in the ED until their evaluation is complete.     Anselm Pancoast, PA-C 08/20/20 1828    Rozelle Logan, DO 08/20/20 2221

## 2020-08-21 LAB — CBG MONITORING, ED: Glucose-Capillary: 283 mg/dL — ABNORMAL HIGH (ref 70–99)

## 2020-08-21 MED ORDER — KETOROLAC TROMETHAMINE 15 MG/ML IJ SOLN
15.0000 mg | Freq: Once | INTRAMUSCULAR | Status: AC
  Administered 2020-08-21: 15 mg via INTRAVENOUS
  Filled 2020-08-21: qty 1

## 2020-08-21 MED ORDER — IBUPROFEN 600 MG PO TABS: 600.0000 mg | ORAL_TABLET | Freq: Three times a day (TID) | ORAL | 0 refills | Status: DC | PRN

## 2020-08-21 MED ORDER — SODIUM CHLORIDE 0.9 % IV BOLUS
1000.0000 mL | Freq: Once | INTRAVENOUS | Status: AC
  Administered 2020-08-21: 1000 mL via INTRAVENOUS

## 2020-08-21 NOTE — Discharge Instructions (Signed)
Continue taking home medications as prescribed. Make sure to stay well-hydrated water. Take ibuprofen up to 3 times a day with meals as needed for headache. Follow-up with your primary care doctor if you continue to have headaches or issues with your blood sugar. Return to the emergency room if you develop severe worsening headache, vision changes, slurred speech, numbness, chest pain, any new or worsening, concerning symptoms

## 2020-08-21 NOTE — ED Provider Notes (Signed)
[facility] EMERGENCY DEPARTMENT Provider Note   CSN: [id number] Arrival date & time: 08/20/20  1757     History Chief Complaint  Patient presents with   Migraine   Dizziness    Mariah Miller is a 45 y.o. female presenting for evaluation of headache and presyncope.  Patient states her symptoms began last night.  She states she has a history of migraines, this feels similar, but more severe than normal.  With her previous migraines, she has had presyncope feeling.  She has associated nausea, no vomiting.  She denies fever, chest pain, cough, abdominal pain, urinary symptoms.  She does have a history of diabetes, has been taking her medications as prescribed.  She works in a [employer], states this is a new job and states that area is very hot and she does not have access to p.o. water.  She has not taken anything for her headache.  It does not radiate.  No associated vision changes or slurred speech.  No new numbness or weakness.  Additional history taken chart review.  History of depression, diabetes, hypertension, migraine, self-reported absence seizure's   HPI     Past Medical History:  Diagnosis Date   Depression    Diabetes mellitus without complication ([place])    Hypertension    Migraine    Seizures ([place])    self reported - last seizure was Oct 2019 "absent seizure"    Patient Active Problem List   Diagnosis Date Noted   Eczema 05/26/2017   Primary insomnia 09/28/2016   Anxiety and depression 05/26/2016   OSA (obstructive sleep apnea) 04/30/2015   Chest pain 04/28/2015   DM2 (diabetes mellitus, type 2) ([place]) 04/28/2015   ASCUS with positive high risk HPV cervical 06/06/2014   Anemia, iron deficiency 03/17/2014   Dyslipidemia 03/17/2014   Essential hypertension 03/17/2014   Uncontrolled diabetes mellitus ([place]) 06/17/2013   Hypoglycemia 04/08/2013   Diabetic neuropathy, painful ([place]) 03/21/2013   Other and unspecified hyperlipidemia 01/22/2013    A C DEGENERATION-CHRONIC 10/27/2008   SHOULDER PAIN 05/01/2008   IMPINGEMENT SYNDROME 05/01/2008    Past Surgical History:  Procedure Laterality Date   CHOLECYSTECTOMY     CYST EXCISION     SHOULDER OPEN ROTATOR CUFF REPAIR Left    TONSILLECTOMY     TUBAL LIGATION       OB History     Gravida  6   Para  3   Term  3   Preterm      AB  3   Living  3      SAB  2   IAB      Ectopic      Multiple      Live Births  3           Family History  Problem Relation Age of Onset   Hypertension Mother    Thyroid disease Mother    Irregular heart beat Mother    Bipolar disorder Sister    Colon cancer Other    Esophageal cancer Neg Hx    Rectal cancer Neg Hx    Stomach cancer Neg Hx     Social History   Tobacco Use   Smoking status: Former    Pack years: 0.00    Types: Cigarettes   Smokeless tobacco: Never   Tobacco comments:    Smoked for 4 years, quit age 44  Vaping Use   Vaping Use: Never used  Substance Use Topics   Alcohol  use: Yes    Alcohol/week: 0.0 standard drinks    Comment: Holidays only   Drug use: No    Home Medications Prior to Admission medications   Medication Sig Start Date End Date Taking? Authorizing Provider  ibuprofen (ADVIL) 600 MG tablet Take 1 tablet (600 mg total) by mouth every 8 (eight) hours as needed. 08/21/20  Yes Henessy Rohrer, PA-C  aspirin EC 81 MG tablet Take 1 tablet (81 mg total) by mouth daily. 01/22/13   Dhungel, Flonnie Overman, MD  Blood Glucose Monitoring Suppl (TRUE METRIX METER) w/Device KIT USE AS DIRECTED Patient taking differently: 1 each by Other route as directed. 05/16/18   Charlott Rakes, MD  Clotrimazole 1 % OINT Apply 1 each topically 2 (two) times daily. Patient not taking: No sig reported 02/04/19   Charlott Rakes, MD  dapagliflozin propanediol (FARXIGA) 10 MG TABS tablet Take 1 tablet (10 mg total) by mouth daily before breakfast. 12/02/19   Charlott Rakes, MD  ferrous sulfate 325 (65 FE) MG  tablet Take 1 tablet (325 mg total) by mouth 2 (two) times daily with a meal. 04/24/17   Charlott Rakes, MD  gabapentin (NEURONTIN) 300 MG capsule Take 2 capsules (600 mg total) by mouth 3 (three) times daily. 12/02/19   Charlott Rakes, MD  glucose blood (TRUE METRIX BLOOD GLUCOSE TEST) test strip Use as instructed Patient taking differently: 1 each by Other route as directed. 05/16/18   Charlott Rakes, MD  insulin aspart (NOVOLOG) 100 UNIT/ML injection Inject 0-12 Units into the skin 3 (three) times daily. As per Sliding-Scale 12/02/19   Charlott Rakes, MD  Insulin Glargine (BASAGLAR KWIKPEN) 100 UNIT/ML INJECT 34 UNITS INTO THE SKIN 2 (TWO) TIMES DAILY. TITRATE UP BY 2 UNITS TWICE DAILY EVERY 4TH DAY UNTIL BLOOD SUGARS ARE AT GOAL. MAX 40 UNITS TWICE DAILY 12/02/19 12/01/20  Charlott Rakes, MD  insulin glargine (LANTUS SOLOSTAR) 100 UNIT/ML Solostar Pen Inject 34 Units into the skin 2 (two) times daily. Titrate up by 2 units twice daily every 4th day until blood sugars are at goal.  Maximum daily dose of 40 units twice daily 12/02/19   Charlott Rakes, MD  Insulin Pen Needle 32G X 4 MM MISC USE AS INSTRUCTED TO INJECT INSULIN AND VICTOZA DAILY. 04/21/20 04/21/21  Charlott Rakes, MD  Insulin Syringe-Needle U-100 31G X 5/16" 0.3 ML MISC USE TO INJECT INSULIN NOVOLOG TID. 04/21/20 04/21/21  Charlott Rakes, MD  liraglutide (VICTOZA) 18 MG/3ML SOPN Inject 1.8 mg into the skin daily. 12/02/19   Charlott Rakes, MD  lisinopril-hydrochlorothiazide (ZESTORETIC) 20-12.5 MG tablet TAKE 2 TABLETS BY MOUTH DAILY. 04/22/20 04/22/21  Charlott Rakes, MD  metFORMIN (GLUCOPHAGE) 1000 MG tablet Take 1 tablet (1,000 mg total) by mouth 2 (two) times daily with a meal. 08/20/19   Charlott Rakes, MD  metoprolol tartrate (LOPRESSOR) 50 MG tablet TAKE 1 TABLET (50 MG TOTAL) BY MOUTH 2 (TWO) TIMES DAILY. 04/22/20 04/22/21  Charlott Rakes, MD  metroNIDAZOLE (METROGEL) 0.75 % vaginal gel PLACE 1 APPLICATORFUL VAGINALLY AT BEDTIME.  04/23/20 04/23/21  Charlott Rakes, MD  promethazine (PHENERGAN) 25 MG tablet Take 1 tablet (25 mg total) by mouth every 6 (six) hours as needed for nausea or vomiting. Patient not taking: No sig reported 06/27/19   Dalia Heading, PA-C  simvastatin (ZOCOR) 80 MG tablet Take 1 tablet (80 mg total) by mouth at bedtime. 12/05/19   Charlott Rakes, MD  sucralfate (CARAFATE) 1 g tablet Take 1 tablet (1 g total) by mouth 4 (four) times daily -  with meals and at bedtime. Patient not taking: No sig reported 06/18/18   Muthersbaugh, Dahlia Client, PA-C  traZODone (DESYREL) 100 MG tablet Take 1 tablet (100 mg total) by mouth at bedtime as needed for sleep. 08/20/19   Hoy Register, MD  TRUEplus Lancets 28G MISC USE AS INSTRUCTED Patient taking differently: 1 each by Other route as directed. 05/16/18   Hoy Register, MD    Allergies    Tomato  Review of Systems   Review of Systems  Gastrointestinal:  Positive for nausea.  Neurological:  Positive for light-headedness and headaches.  All other systems reviewed and are negative.  Physical Exam Updated Vital Signs BP (!) 146/71 (BP Location: Right Arm)   Pulse 72   Temp 98.1 F (36.7 C) (Oral)   Resp 18   SpO2 100%   Physical Exam Vitals and nursing note reviewed.  Constitutional:      General: She is not in acute distress.    Appearance: Normal appearance.     Comments: Resting in the bed in NAD  HENT:     Head: Normocephalic and atraumatic.  Eyes:     Extraocular Movements: Extraocular movements intact.     Conjunctiva/sclera: Conjunctivae normal.     Pupils: Pupils are equal, round, and reactive to light.  Cardiovascular:     Rate and Rhythm: Normal rate and regular rhythm.     Pulses: Normal pulses.  Pulmonary:     Effort: Pulmonary effort is normal. No respiratory distress.     Breath sounds: Normal breath sounds. No wheezing.     Comments: Speaking in full sentences.  Clear lung sounds in all fields. Abdominal:     General: There  is no distension.     Palpations: Abdomen is soft. There is no mass.     Tenderness: There is no abdominal tenderness. There is no guarding or rebound.  Musculoskeletal:        General: Normal range of motion.     Cervical back: Normal range of motion and neck supple.  Skin:    General: Skin is warm and dry.     Capillary Refill: Capillary refill takes less than 2 seconds.  Neurological:     Mental Status: She is alert and oriented to person, place, and time.     GCS: GCS eye subscore is 4. GCS verbal subscore is 5. GCS motor subscore is 6.     Cranial Nerves: Cranial nerves are intact.     Sensory: Sensation is intact.     Motor: Motor function is intact.     Comments: No neuro deficits noted  Psychiatric:        Mood and Affect: Mood and affect normal.        Speech: Speech normal.        Behavior: Behavior normal.    ED Results / Procedures / Treatments   Labs (all labs ordered are listed, but only abnormal results are displayed) Labs Reviewed  BASIC METABOLIC PANEL - Abnormal; Notable for the following components:      Result Value   Glucose, Bld 119 (*)    Calcium 8.7 (*)    All other components within normal limits  CBC WITH DIFFERENTIAL/PLATELET - Abnormal; Notable for the following components:   RBC 3.84 (*)    Hemoglobin 11.4 (*)    HCT 35.4 (*)    All other components within normal limits  CBG MONITORING, ED - Abnormal; Notable for the following components:   Glucose-Capillary 283 (*)  All other components within normal limits  I-STAT BETA HCG BLOOD, ED ([facility], [facility], [facility] ONLY)    EKG None  Radiology No results found.  Procedures Procedures   Medications Ordered in ED Medications  sodium chloride 0.9 % bolus 1,000 mL (0 mLs Intravenous Stopped 08/21/20 1000)  ketorolac (TORADOL) 15 MG/ML injection 15 mg (15 mg Intravenous Given 08/21/20 0836)    ED Course  I have reviewed the triage vital signs and the nursing notes.  Pertinent labs & imaging results  that were available during my care of the patient were reviewed by me and considered in my medical decision making (see chart for details).    MDM Rules/Calculators/A&P                          Patient presented for evaluation of headache and presyncope.  On exam, patient appears nontoxic.  No neurologic deficits.  History of migraines which presents similarly, this is likely the cause of her symptoms.  Labs obtained in triage interpreted by me, overall reassuring.  Electrolytes stable.  Stable chronic mild anemia.  EKG nonischemic.  Without acute neurologic deficits, low suspicion for TIA, SAH, ICH, CVA.  In the setting of history of migraines with similar symptoms, doubt cardiac cause, despite the presyncopal feeling.  Will treat symptomatically. Patient has been in the waiting room for 14 hours.  Blood sugar is now elevated around 300.  Will give fluids.  She has not taken any of her nighttime or morning medicines for diabetes or hypertension.  However initial labs were not consistent with DKA, and I do not think this is the cause of her sxs.   On reassessment after tx, pt states sxs are improved.  Return precautions given.  At this time, patient appears safe for discharge.  Patient states she understands and agrees to plan.  Final Clinical Impression(s) / ED Diagnoses Final diagnoses:  Acute nonintractable headache, unspecified headache type  Hyperglycemia    Rx / DC Orders ED Discharge Orders          Ordered    ibuprofen (ADVIL) 600 MG tablet  Every 8 hours PRN        08/21/20 0937             Franchot Heidelberg, PA-C 08/21/20 1021    Blanchie Dessert, MD 08/21/20 1410

## 2020-08-26 ENCOUNTER — Ambulatory Visit: Payer: Managed Care, Other (non HMO) | Attending: Family Medicine | Admitting: Physician Assistant

## 2020-08-26 ENCOUNTER — Other Ambulatory Visit: Payer: Self-pay

## 2020-08-26 DIAGNOSIS — Z794 Long term (current) use of insulin: Secondary | ICD-10-CM | POA: Diagnosis not present

## 2020-08-26 DIAGNOSIS — Z09 Encounter for follow-up examination after completed treatment for conditions other than malignant neoplasm: Secondary | ICD-10-CM

## 2020-08-26 DIAGNOSIS — R519 Headache, unspecified: Secondary | ICD-10-CM

## 2020-08-26 DIAGNOSIS — E1142 Type 2 diabetes mellitus with diabetic polyneuropathy: Secondary | ICD-10-CM

## 2020-08-26 MED ORDER — DAPAGLIFLOZIN PROPANEDIOL 10 MG PO TABS
10.0000 mg | ORAL_TABLET | Freq: Every day | ORAL | 6 refills | Status: DC
  Filled 2020-08-26 – 2020-10-01 (×2): qty 30, 30d supply, fill #0
  Filled 2021-02-07: qty 30, 30d supply, fill #1
  Filled 2021-04-26: qty 30, 30d supply, fill #2
  Filled 2021-04-26 – 2021-04-27 (×2): qty 30, 30d supply, fill #0
  Filled 2021-08-19: qty 30, 30d supply, fill #1

## 2020-08-26 NOTE — Progress Notes (Signed)
Patient ID: Mariah Miller, female   DOB: April 15, 1975, 45 y.o.   MRN: [id number]    Virtual Visit via Telephone Note  I connected with Junius Finner on 08/26/20 at  8:50 AM EDT by telephone and verified that I am speaking with the correct person using two identifiers.  Location: Patient: home Provider: [facility] office   I discussed the limitations, risks, security and privacy concerns of performing an evaluation and management service by telephone and the availability of in person appointments. I also discussed with the patient that there may be a patient responsible charge related to this service. The patient expressed understanding and agreed to proceed.   History of Present Illness: After being seen in the ED 08/20/2020 for HA.  HA improving.  It is relieved by tylenol.  Blood sugars running from 90-258 over the last 2 weeks.  She says she is compliant on meds 100% of the time.  Admits to poor diet and inadequate water intake.  She feels good overall.  Needs RF on farxiga  From ED visit: Mariah Miller is a 45 y.o. female presenting for evaluation of headache and presyncope.  Patient states her symptoms began last night.  She states she has a history of migraines, this feels similar, but more severe than normal.  With her previous migraines, she has had presyncope feeling.  She has associated nausea, no vomiting.  She denies fever, chest pain, cough, abdominal pain, urinary symptoms.  She does have a history of diabetes, has been taking her medications as prescribed.  She works in a [employer], states this is a new job and states that area is very hot and she does not have access to p.o. water.  She has not taken anything for her headache.  It does not radiate.  No associated vision changes or slurred speech.  No new numbness or weakness.   Additional history taken chart review.  History of depression, diabetes, hypertension, migraine, self-reported absence seizure's  From A/P:   Patient presented for evaluation of headache and presyncope.  On exam, patient appears nontoxic.  No neurologic deficits.  History of migraines which presents similarly, this is likely the cause of her symptoms.  Labs obtained in triage interpreted by me, overall reassuring.  Electrolytes stable.  Stable chronic mild anemia.  EKG nonischemic.  Without acute neurologic deficits, low suspicion for TIA, SAH, ICH, CVA.  In the setting of history of migraines with similar symptoms, doubt cardiac cause, despite the presyncopal feeling.  Will treat symptomatically. Patient has been in the waiting room for 14 hours.  Blood sugar is now elevated around 300.  Will give fluids.  She has not taken any of her nighttime or morning medicines for diabetes or hypertension.  However initial labs were not consistent with DKA, and I do not think this is the cause of her sxs.   On reassessment after tx, pt states sxs are improved.  Return precautions given.  At this time, patient appears safe for discharge.  Patient states she understands and agrees to plan.   Observations/Objective:  NAD A&Ox3   Assessment and Plan: Marland Kitchen Type 2 diabetes mellitus with diabetic polyneuropathy, with long-term current use of insulin (HCC) Last A1C=10.6;  unsure about compliance but she says she is taking lantus 38 bid, metformin 1000 bid, and victoza - Hemoglobin A1c; Future - dapagliflozin propanediol (FARXIGA) 10 MG TABS tablet; Take 1 tablet (10 mg total) by mouth daily before breakfast.  Dispense: 30 tablet; Refill: 6 Diabetic  diet and drink adequate water  2. Encounter for examination following treatment at hospital  3. Nonintractable headache, unspecified chronicity pattern, unspecified headache type Resolves with tylenol   Follow Up Instructions: 2 months with PCP   I discussed the assessment and treatment plan with the patient. The patient was provided an opportunity to ask questions and all were answered. The patient agreed  with the plan and demonstrated an understanding of the instructions.   The patient was advised to call back or seek an in-person evaluation if the symptoms worsen or if the condition fails to improve as anticipated.  I provided 14 minutes of non-face-to-face time during this encounter.   Georgian Co, PA-C

## 2020-08-29 MED FILL — Insulin Glargine Soln Pen-Injector 100 Unit/ML: SUBCUTANEOUS | 30 days supply | Qty: 30 | Fill #1 | Status: CN

## 2020-08-31 ENCOUNTER — Other Ambulatory Visit: Payer: Self-pay

## 2020-09-02 ENCOUNTER — Other Ambulatory Visit: Payer: Self-pay

## 2020-09-08 ENCOUNTER — Other Ambulatory Visit: Payer: Self-pay

## 2020-10-01 ENCOUNTER — Other Ambulatory Visit: Payer: Self-pay | Admitting: Family Medicine

## 2020-10-01 ENCOUNTER — Emergency Department (HOSPITAL_COMMUNITY): Payer: Managed Care, Other (non HMO)

## 2020-10-01 ENCOUNTER — Other Ambulatory Visit: Payer: Self-pay

## 2020-10-01 ENCOUNTER — Emergency Department (HOSPITAL_COMMUNITY)
Admission: EM | Admit: 2020-10-01 | Discharge: 2020-10-01 | Disposition: A | Discharge status: Home / Self Care | Payer: Managed Care, Other (non HMO) | Attending: Emergency Medicine | Admitting: Emergency Medicine

## 2020-10-01 ENCOUNTER — Encounter (HOSPITAL_COMMUNITY): Payer: Self-pay | Admitting: Emergency Medicine

## 2020-10-01 ENCOUNTER — Other Ambulatory Visit: Payer: Self-pay | Admitting: Cardiology

## 2020-10-01 DIAGNOSIS — Z7984 Long term (current) use of oral hypoglycemic drugs: Secondary | ICD-10-CM | POA: Insufficient documentation

## 2020-10-01 DIAGNOSIS — G43909 Migraine, unspecified, not intractable, without status migrainosus: Secondary | ICD-10-CM | POA: Insufficient documentation

## 2020-10-01 DIAGNOSIS — E1142 Type 2 diabetes mellitus with diabetic polyneuropathy: Secondary | ICD-10-CM

## 2020-10-01 DIAGNOSIS — E1140 Type 2 diabetes mellitus with diabetic neuropathy, unspecified: Secondary | ICD-10-CM | POA: Insufficient documentation

## 2020-10-01 DIAGNOSIS — E1165 Type 2 diabetes mellitus with hyperglycemia: Secondary | ICD-10-CM | POA: Insufficient documentation

## 2020-10-01 DIAGNOSIS — Z79899 Other long term (current) drug therapy: Secondary | ICD-10-CM | POA: Diagnosis not present

## 2020-10-01 DIAGNOSIS — Z87891 Personal history of nicotine dependence: Secondary | ICD-10-CM | POA: Insufficient documentation

## 2020-10-01 DIAGNOSIS — I10 Essential (primary) hypertension: Secondary | ICD-10-CM | POA: Diagnosis not present

## 2020-10-01 DIAGNOSIS — Z794 Long term (current) use of insulin: Secondary | ICD-10-CM | POA: Diagnosis not present

## 2020-10-01 DIAGNOSIS — E1169 Type 2 diabetes mellitus with other specified complication: Secondary | ICD-10-CM

## 2020-10-01 DIAGNOSIS — Z20822 Contact with and (suspected) exposure to covid-19: Secondary | ICD-10-CM | POA: Insufficient documentation

## 2020-10-01 DIAGNOSIS — R739 Hyperglycemia, unspecified: Secondary | ICD-10-CM

## 2020-10-01 DIAGNOSIS — R569 Unspecified convulsions: Secondary | ICD-10-CM | POA: Insufficient documentation

## 2020-10-01 DIAGNOSIS — R079 Chest pain, unspecified: Secondary | ICD-10-CM | POA: Insufficient documentation

## 2020-10-01 LAB — CBC WITH DIFFERENTIAL/PLATELET
Abs Immature Granulocytes: 0.01 10*3/uL (ref 0.00–0.07)
Basophils Absolute: 0.1 10*3/uL (ref 0.0–0.1)
Basophils Relative: 1 %
Eosinophils Absolute: 0.1 10*3/uL (ref 0.0–0.5)
Eosinophils Relative: 2 %
HCT: 34.1 % — ABNORMAL LOW (ref 36.0–46.0)
Hemoglobin: 11.2 g/dL — ABNORMAL LOW (ref 12.0–15.0)
Immature Granulocytes: 0 %
Lymphocytes Relative: 36 %
Lymphs Abs: 1.9 10*3/uL (ref 0.7–4.0)
MCH: 29.9 pg (ref 26.0–34.0)
MCHC: 32.8 g/dL (ref 30.0–36.0)
MCV: 91.2 fL (ref 80.0–100.0)
Monocytes Absolute: 0.5 10*3/uL (ref 0.1–1.0)
Monocytes Relative: 9 %
Neutro Abs: 2.8 10*3/uL (ref 1.7–7.7)
Neutrophils Relative %: 52 %
Platelets: 298 10*3/uL (ref 150–400)
RBC: 3.74 MIL/uL — ABNORMAL LOW (ref 3.87–5.11)
RDW: 13 % (ref 11.5–15.5)
WBC: 5.4 10*3/uL (ref 4.0–10.5)
nRBC: 0 % (ref 0.0–0.2)

## 2020-10-01 LAB — RESP PANEL BY RT-PCR (FLU A&B, COVID) ARPGX2
Influenza A by PCR: NEGATIVE
Influenza B by PCR: NEGATIVE
SARS Coronavirus 2 by RT PCR: NEGATIVE

## 2020-10-01 LAB — TROPONIN I (HIGH SENSITIVITY)
Troponin I (High Sensitivity): <2 ng/L (ref <18)
Troponin I (High Sensitivity): 3 ng/L (ref <18)

## 2020-10-01 LAB — COMPREHENSIVE METABOLIC PANEL
ALT: 14 U/L (ref 0–44)
AST: 17 U/L (ref 15–41)
Albumin: 3.2 g/dL — ABNORMAL LOW (ref 3.5–5.0)
Alkaline Phosphatase: 76 U/L (ref 38–126)
Anion gap: 5 (ref 5–15)
BUN: 11 mg/dL (ref 6–20)
CO2: 22 mmol/L (ref 22–32)
Calcium: 8.6 mg/dL — ABNORMAL LOW (ref 8.9–10.3)
Chloride: 104 mmol/L (ref 98–111)
Creatinine, Ser: 0.79 mg/dL (ref 0.44–1.00)
GFR, Estimated: >60 mL/min (ref >60)
Glucose, Bld: 409 mg/dL — ABNORMAL HIGH (ref 70–99)
Potassium: 4.7 mmol/L (ref 3.5–5.1)
Sodium: 131 mmol/L — ABNORMAL LOW (ref 135–145)
Total Bilirubin: 0.6 mg/dL (ref 0.3–1.2)
Total Protein: 6.4 g/dL — ABNORMAL LOW (ref 6.5–8.1)

## 2020-10-01 LAB — D-DIMER, QUANTITATIVE: D-Dimer, Quant: 0.38 ug/mL-FEU (ref 0.00–0.50)

## 2020-10-01 MED ORDER — METFORMIN HCL 1000 MG PO TABS
1000.0000 mg | ORAL_TABLET | Freq: Two times a day (BID) | ORAL | 1 refills | Status: DC
  Filled 2020-10-01 – 2021-04-26 (×2): qty 60, 30d supply, fill #0
  Filled 2021-04-26: qty 60, 30d supply, fill #1
  Filled 2021-04-27: qty 60, 30d supply, fill #0
  Filled 2021-08-19: qty 60, 30d supply, fill #1

## 2020-10-01 MED ORDER — INSULIN ASPART 100 UNIT/ML IJ SOLN
5.0000 [IU] | Freq: Once | INTRAMUSCULAR | Status: AC
  Administered 2020-10-01: 5 [IU] via INTRAVENOUS

## 2020-10-01 MED ORDER — MORPHINE SULFATE (PF) 4 MG/ML IV SOLN
4.0000 mg | Freq: Once | INTRAVENOUS | Status: AC
  Administered 2020-10-01: 4 mg via INTRAVENOUS
  Filled 2020-10-01: qty 1

## 2020-10-01 MED ORDER — INSULIN ASPART 100 UNIT/ML IV SOLN: 10.0000 [IU] | Freq: Once | INTRAVENOUS | Status: DC

## 2020-10-01 MED ORDER — GABAPENTIN 300 MG PO CAPS
600.0000 mg | ORAL_CAPSULE | Freq: Three times a day (TID) | ORAL | 6 refills | Status: DC
  Filled 2020-10-01 – 2021-04-26 (×2): qty 120, 20d supply, fill #0
  Filled 2021-04-26: qty 120, 20d supply, fill #1
  Filled 2021-04-27: qty 120, 20d supply, fill #0
  Filled 2021-08-19: qty 120, 20d supply, fill #1

## 2020-10-01 MED ORDER — SIMVASTATIN 80 MG PO TABS
80.0000 mg | ORAL_TABLET | Freq: Every day | ORAL | 6 refills | Status: DC
  Filled 2020-10-01: qty 30, 30d supply, fill #0

## 2020-10-01 MED FILL — Metoprolol Tartrate Tab 50 MG: ORAL | 30 days supply | Qty: 60 | Fill #0 | Status: AC

## 2020-10-01 MED FILL — Lisinopril & Hydrochlorothiazide Tab 20-12.5 MG: ORAL | 30 days supply | Qty: 60 | Fill #0 | Status: AC

## 2020-10-01 MED FILL — Insulin Glargine Soln Pen-Injector 100 Unit/ML: SUBCUTANEOUS | 30 days supply | Qty: 24 | Fill #1 | Status: CN

## 2020-10-01 NOTE — Telephone Encounter (Signed)
  Notes to clinic:  Verify that patient is still taking Lantus, Novolog, and Victoza  for refill.    Requested Prescriptions  Pending Prescriptions Disp Refills   liraglutide (VICTOZA) 18 MG/3ML SOPN 30 mL 6    Sig: Inject 1.8 mg into the skin daily.      Endocrinology:  Diabetes - GLP-1 Receptor Agonists Failed - 10/01/2020  1:59 PM      Failed - HBA1C is between 0 and 7.9 and within 180 days    HbA1c, POC (controlled diabetic range)  Date Value Ref Range Status  12/02/2019 10.6 (A) 0.0 - 7.0 % Final          Passed - Valid encounter within last 6 months    Recent Outpatient Visits           1 month ago Encounter for examination following treatment at hospital   [facility] [place], Marzella Schlein, [place]   5 months ago Annual physical exam   [facility] [place], [place], [place]   10 months ago Type 2 diabetes mellitus with diabetic polyneuropathy, with long-term current use of insulin (HCC)   [facility] [place], [place], [place]   1 year ago Type 2 diabetes mellitus with diabetic polyneuropathy, with long-term current use of insulin (HCC)   [facility] [place], Odette Horns, [place]   1 year ago Type 2 diabetes mellitus with diabetic polyneuropathy, with long-term current use of insulin (HCC)   [facility] [place], Odette Horns, [place]       Future Appointments             In 4 days Tobb, Lavona Mound, DO [facility]   In 3 weeks Hoy Register, MD [facility]

## 2020-10-01 NOTE — Telephone Encounter (Signed)
Pt needs refill for   lisinopril-hydrochlorothiazide (ZESTORETIC) 20-12.5 MG tablet  simvastatin (ZOCOR) 80 MG tablet   insulin glargine (LANTUS SOLOSTAR) 100 UNIT/ML Solostar Pen   metFORMIN (GLUCOPHAGE) 1000 MG tablet  insulin aspart (NOVOLOG) 100 UNIT/ML injection  liraglutide (VICTOZA) 18 MG/3ML SOPN  Send to  [facility]  [address], [place] [address]  Phone:  [phone]  Fax:  [phone]

## 2020-10-01 NOTE — Discharge Instructions (Addendum)
Schedule an appointment with your primary doctor for improved diabetes management and glucose control.  Schedule appointment local cardiologist to discuss stress test. Return for new or worsening signs or symptoms.

## 2020-10-01 NOTE — ED Triage Notes (Signed)
Pt arrives via EMS for chest pain that started at midnight. Sharp, intermittent left sided chest pain.Worsened while pt was at work. Bp 170/98 initially, 148/62 with one nitro. EMS also gave 324 mg ASA. EKG showed Sinus rhythm, HR 76. CBG 246. Pain decreased from 9/10-7/10 with the nitro from EMS.

## 2020-10-01 NOTE — ED Notes (Signed)
Reviewed discharge instructions with patient. Follow-up care reviewed. Patient verbalized understanding. Patient A&Ox4, VSS, and ambulatory with steady gait upon discharge.  

## 2020-10-01 NOTE — ED Provider Notes (Signed)
[facility] EMERGENCY DEPARTMENT Provider Note   CSN: [id number] Arrival date & time: 10/01/20  0758     History Chief Complaint  Patient presents with   Chest Pain    Mariah Miller is a 45 y.o. female.  Patient with history of diabetes, high blood pressure, obesity, seizures, sleep apnea no current cigarette smoking presents with intermittent sharp left-sided chest pain that woke her from sleep at midnight.  Pain has continued intermittently for minutes at a time nonradiating.  Mild pain with a deep breath.  Patient's had intermittent brief chest pains but nothing this severe.  Patient denies any known ACS/heart attack/pulmonary embolism.  Patient denies recent surgeries or estrogen use.  No fevers chills or cough.      Past Medical History:  Diagnosis Date   Depression    Diabetes mellitus without complication ([place])    Hypertension    Migraine    Seizures ([place])    self reported - last seizure was Oct 2019 "absent seizure"    Patient Active Problem List   Diagnosis Date Noted   Eczema 05/26/2017   Primary insomnia 09/28/2016   Anxiety and depression 05/26/2016   OSA (obstructive sleep apnea) 04/30/2015   Chest pain 04/28/2015   DM2 (diabetes mellitus, type 2) ([place]) 04/28/2015   ASCUS with positive high risk HPV cervical 06/06/2014   Anemia, iron deficiency 03/17/2014   Dyslipidemia 03/17/2014   Essential hypertension 03/17/2014   Uncontrolled diabetes mellitus ([place]) 06/17/2013   Hypoglycemia 04/08/2013   Diabetic neuropathy, painful ([place]) 03/21/2013   Other and unspecified hyperlipidemia 01/22/2013   A C DEGENERATION-CHRONIC 10/27/2008   SHOULDER PAIN 05/01/2008   IMPINGEMENT SYNDROME 05/01/2008    Past Surgical History:  Procedure Laterality Date   CHOLECYSTECTOMY     CYST EXCISION     SHOULDER OPEN ROTATOR CUFF REPAIR Left    TONSILLECTOMY     TUBAL LIGATION       OB History     Gravida  6   Para  3   Term  3   Preterm       AB  3   Living  3      SAB  2   IAB      Ectopic      Multiple      Live Births  3           Family History  Problem Relation Age of Onset   Hypertension Mother    Thyroid disease Mother    Irregular heart beat Mother    Bipolar disorder Sister    Colon cancer Other    Esophageal cancer Neg Hx    Rectal cancer Neg Hx    Stomach cancer Neg Hx     Social History   Tobacco Use   Smoking status: Former    Types: Cigarettes   Smokeless tobacco: Never   Tobacco comments:    Smoked for 4 years, quit age 89  Vaping Use   Vaping Use: Never used  Substance Use Topics   Alcohol use: Yes    Alcohol/week: 0.0 standard drinks    Comment: Holidays only   Drug use: No    Home Medications Prior to Admission medications   Medication Sig Start Date End Date Taking? Authorizing Provider  aspirin EC 81 MG tablet Take 1 tablet (81 mg total) by mouth daily. 01/22/13  Yes Dhungel, Nishant, MD  dapagliflozin propanediol (FARXIGA) 10 MG TABS tablet Take 1 tablet (10 mg total)  by mouth daily before breakfast. 08/26/20  Yes McClung, Dionne Bucy, PA-C  ferrous sulfate 325 (65 FE) MG tablet Take 1 tablet (325 mg total) by mouth 2 (two) times daily with a meal. 04/24/17  Yes Newlin, Enobong, MD  gabapentin (NEURONTIN) 300 MG capsule Take 2 capsules (600 mg total) by mouth 3 (three) times daily. 12/02/19  Yes Newlin, Charlane Ferretti, MD  insulin aspart (NOVOLOG) 100 UNIT/ML injection Inject 0-12 Units into the skin 3 (three) times daily. As per Sliding-Scale 12/02/19  Yes Newlin, Enobong, MD  insulin glargine (LANTUS SOLOSTAR) 100 UNIT/ML Solostar Pen Inject 34 Units into the skin 2 (two) times daily. Titrate up by 2 units twice daily every 4th day until blood sugars are at goal.  Maximum daily dose of 40 units twice daily 12/02/19  Yes Newlin, Enobong, MD  liraglutide (VICTOZA) 18 MG/3ML SOPN Inject 1.8 mg into the skin daily. 12/02/19  Yes Newlin, Charlane Ferretti, MD  lisinopril-hydrochlorothiazide  (ZESTORETIC) 20-12.5 MG tablet TAKE 2 TABLETS BY MOUTH DAILY. 04/22/20 04/22/21 Yes Charlott Rakes, MD  metFORMIN (GLUCOPHAGE) 1000 MG tablet Take 1 tablet (1,000 mg total) by mouth 2 (two) times daily with a meal. 08/20/19  Yes Newlin, Enobong, MD  metoprolol tartrate (LOPRESSOR) 50 MG tablet TAKE 1 TABLET (50 MG TOTAL) BY MOUTH 2 (TWO) TIMES DAILY. 04/22/20 04/22/21 Yes Charlott Rakes, MD  promethazine (PHENERGAN) 25 MG tablet Take 1 tablet (25 mg total) by mouth every 6 (six) hours as needed for nausea or vomiting. 06/27/19  Yes Lawyer, Harrell Gave, PA-C  simvastatin (ZOCOR) 80 MG tablet Take 1 tablet (80 mg total) by mouth at bedtime. 12/05/19  Yes Charlott Rakes, MD  traZODone (DESYREL) 100 MG tablet Take 1 tablet (100 mg total) by mouth at bedtime as needed for sleep. 08/20/19  Yes Charlott Rakes, MD  Blood Glucose Monitoring Suppl (TRUE METRIX METER) w/Device KIT USE AS DIRECTED Patient taking differently: 1 each by Other route as directed. 05/16/18   Charlott Rakes, MD  Clotrimazole 1 % OINT Apply 1 each topically 2 (two) times daily. Patient not taking: No sig reported 02/04/19   Charlott Rakes, MD  glucose blood (TRUE METRIX BLOOD GLUCOSE TEST) test strip Use as instructed Patient taking differently: 1 each by Other route as directed. 05/16/18   Charlott Rakes, MD  ibuprofen (ADVIL) 600 MG tablet Take 1 tablet (600 mg total) by mouth every 8 (eight) hours as needed. Patient not taking: Reported on 10/01/2020 08/21/20   Caccavale, Sophia, PA-C  Insulin Glargine (BASAGLAR KWIKPEN) 100 UNIT/ML INJECT 34 UNITS INTO THE SKIN 2 (TWO) TIMES DAILY. TITRATE UP BY 2 UNITS TWICE DAILY EVERY 4TH DAY UNTIL BLOOD SUGARS ARE AT GOAL. MAX 40 UNITS TWICE DAILY Patient not taking: Reported on 10/01/2020 12/02/19 12/01/20  Charlott Rakes, MD  Insulin Pen Needle 32G X 4 MM MISC USE AS INSTRUCTED TO INJECT INSULIN AND VICTOZA DAILY. 04/21/20 04/21/21  Charlott Rakes, MD  Insulin Syringe-Needle U-100 31G X 5/16" 0.3 ML  MISC USE TO INJECT INSULIN NOVOLOG TID. 04/21/20 04/21/21  Charlott Rakes, MD  metroNIDAZOLE (METROGEL) 0.75 % vaginal gel PLACE 1 APPLICATORFUL VAGINALLY AT BEDTIME. Patient not taking: Reported on 10/01/2020 04/23/20 04/23/21  Charlott Rakes, MD  sucralfate (CARAFATE) 1 g tablet Take 1 tablet (1 g total) by mouth 4 (four) times daily -  with meals and at bedtime. Patient not taking: No sig reported 06/18/18   Muthersbaugh, Jarrett Soho, PA-C  TRUEplus Lancets 28G MISC USE AS INSTRUCTED Patient taking differently: 1 each by Other route as  directed. 05/16/18   Charlott Rakes, MD    Allergies    Tomato  Review of Systems   Review of Systems  Constitutional:  Negative for chills and fever.  HENT:  Negative for congestion.   Eyes:  Negative for visual disturbance.  Respiratory:  Positive for shortness of breath.   Cardiovascular:  Positive for chest pain. Negative for leg swelling.  Gastrointestinal:  Negative for abdominal pain and vomiting.  Genitourinary:  Negative for dysuria and flank pain.  Musculoskeletal:  Negative for back pain, neck pain and neck stiffness.  Skin:  Negative for rash.  Neurological:  Negative for light-headedness and headaches.   Physical Exam Updated Vital Signs BP (!) 142/69 (BP Location: Left Arm)   Pulse 69   Temp 98.2 F (36.8 C) (Oral)   Resp 15   Ht $R'5\' 5"'Ld$  (1.651 m)   Wt 92.5 kg   SpO2 99%   BMI 33.95 kg/m   Physical Exam Vitals and nursing note reviewed.  Constitutional:      General: She is not in acute distress.    Appearance: She is well-developed.  HENT:     Head: Normocephalic and atraumatic.     Mouth/Throat:     Mouth: Mucous membranes are moist.  Eyes:     General:        Right eye: No discharge.        Left eye: No discharge.     Conjunctiva/sclera: Conjunctivae normal.  Neck:     Trachea: No tracheal deviation.  Cardiovascular:     Rate and Rhythm: Normal rate and regular rhythm.     Heart sounds: Normal heart sounds. No murmur  heard. Pulmonary:     Effort: Pulmonary effort is normal.     Breath sounds: Normal breath sounds.  Abdominal:     General: There is no distension.     Palpations: Abdomen is soft.     Tenderness: There is no abdominal tenderness. There is no guarding.  Musculoskeletal:     Cervical back: Normal range of motion and neck supple. No rigidity.     Right lower leg: No tenderness. No edema.     Left lower leg: No tenderness. No edema.  Skin:    General: Skin is warm.     Capillary Refill: Capillary refill takes less than 2 seconds.     Findings: No rash.  Neurological:     General: No focal deficit present.     Mental Status: She is alert.     Cranial Nerves: No cranial nerve deficit.  Psychiatric:        Mood and Affect: Mood normal.    ED Results / Procedures / Treatments   Labs (all labs ordered are listed, but only abnormal results are displayed) Labs Reviewed  COMPREHENSIVE METABOLIC PANEL - Abnormal; Notable for the following components:      Result Value   Sodium 131 (*)    Glucose, Bld 409 (*)    Calcium 8.6 (*)    Total Protein 6.4 (*)    Albumin 3.2 (*)    All other components within normal limits  CBC WITH DIFFERENTIAL/PLATELET - Abnormal; Notable for the following components:   RBC 3.74 (*)    Hemoglobin 11.2 (*)    HCT 34.1 (*)    All other components within normal limits  RESP PANEL BY RT-PCR (FLU A&B, COVID) ARPGX2  D-DIMER, QUANTITATIVE  TROPONIN I (HIGH SENSITIVITY)  TROPONIN I (HIGH SENSITIVITY)    EKG EKG Interpretation  Date/Time:  Thursday October 01 2020 08:02:03 EDT Ventricular Rate:  76 PR Interval:  130 QRS Duration: 90 QT Interval:  379 QTC Calculation: 427 R Axis:   31 Text Interpretation: Sinus rhythm RSR' in V1 or V2, probably normal variant Confirmed by Elnora Morrison [id number]) on 10/01/2020 8:14:59 AM  Radiology DG Chest Portable 1 View  Result Date: 10/01/2020 CLINICAL DATA:  Chest pain. EXAM: PORTABLE CHEST 1 VIEW COMPARISON:   10/03/2019. FINDINGS: Borderline cardiomegaly. No pulmonary venous congestion. Low lung volumes. No focal infiltrate. No pleural effusion or pneumothorax. IMPRESSION: 1.  Low lung volumes.  No focal infiltrate. 2.  Borderline cardiomegaly. Electronically Signed   By: Marcello Moores  Register   On: 10/01/2020 08:36    Procedures Procedures   Medications Ordered in ED Medications  morphine 4 MG/ML injection 4 mg (4 mg Intravenous Given 10/01/20 0849)  insulin aspart (novoLOG) injection 5 Units (5 Units Intravenous Given 10/01/20 1223)    ED Course  I have reviewed the triage vital signs and the nursing notes.  Pertinent labs & imaging results that were available during my care of the patient were reviewed by me and considered in my medical decision making (see chart for details).    MDM Rules/Calculators/A&P                           Patient presents with significant sharp chest pain and no history of cardiac disease known.  Differential includes ACS, pulmonary embolism, musculoskeletal, reflux, other.  Patient has multiple risk factors for ACS plan for delta troponin, EKG ordered and reviewed no acute ischemic findings.  Patient has no classic blood clot risk factors however with pleuritic component significant chest pain D-dimer ordered as patient is low risk.  Chest x-ray portable ordered and reviewed at bedside no pneumothorax no acute findings.  Vital signs reviewed unremarkable except for mild elevated blood pressure.  Discussed importance of improving risk factors for ACS.   Patient's blood work reviewed showing sodium 131 secondary to hyperglycemia 409.  Discussed importance of improved glucose control, patient does have insulin at home long and short acting.  Patient says she takes 5 units minutes around 400, 5 units of insulin ordered.  Patient well-appearing on reassessment no pain.  Discussed close outpatient follow-up and outpatient stress test.  Paged/sent message to cardiology to help  arrange outpatient stress test.  Troponins both negative.  EKG reviewed no acute ischemic findings.  D-dimer negative, patient does not need CT scan is low risk.  Reasons to return discussed.   Final Clinical Impression(s) / ED Diagnoses Final diagnoses:  Acute chest pain  Hyperglycemia    Rx / DC Orders ED Discharge Orders     None        Elnora Morrison, MD 10/01/20 1244

## 2020-10-02 ENCOUNTER — Other Ambulatory Visit: Payer: Self-pay

## 2020-10-02 ENCOUNTER — Telehealth: Payer: Self-pay

## 2020-10-02 MED ORDER — VICTOZA 18 MG/3ML ~~LOC~~ SOPN
1.8000 mg | PEN_INJECTOR | Freq: Every day | SUBCUTANEOUS | 1 refills | Status: DC
  Filled 2020-10-02 (×2): qty 9, 30d supply, fill #0

## 2020-10-02 NOTE — Telephone Encounter (Signed)
PA FOR VICTOZA APPROVED UNTIL 10/02/21

## 2020-10-05 ENCOUNTER — Ambulatory Visit: Payer: Managed Care, Other (non HMO) | Admitting: Cardiology

## 2020-10-05 ENCOUNTER — Encounter: Payer: Self-pay | Admitting: Cardiology

## 2020-10-06 ENCOUNTER — Other Ambulatory Visit: Payer: Self-pay

## 2020-10-07 ENCOUNTER — Other Ambulatory Visit: Payer: Self-pay | Admitting: Pharmacist

## 2020-10-07 ENCOUNTER — Other Ambulatory Visit: Payer: Self-pay

## 2020-10-07 MED ORDER — INSULIN LISPRO (1 UNIT DIAL) 100 UNIT/ML (KWIKPEN)
PEN_INJECTOR | SUBCUTANEOUS | 2 refills | Status: DC
  Filled 2020-10-07 – 2021-04-27 (×5): qty 12, 33d supply, fill #0

## 2020-10-07 MED ORDER — INSULIN GLARGINE-YFGN 100 UNIT/ML ~~LOC~~ SOPN
34.0000 [IU] | PEN_INJECTOR | Freq: Two times a day (BID) | SUBCUTANEOUS | 2 refills | Status: DC
  Filled 2020-10-07: qty 6, 9d supply, fill #0
  Filled 2020-10-07: qty 9, 13d supply, fill #0

## 2020-10-08 ENCOUNTER — Other Ambulatory Visit: Payer: Self-pay

## 2020-10-12 ENCOUNTER — Emergency Department (HOSPITAL_COMMUNITY): Payer: Managed Care, Other (non HMO)

## 2020-10-12 ENCOUNTER — Encounter (HOSPITAL_COMMUNITY): Payer: Self-pay | Admitting: Emergency Medicine

## 2020-10-12 ENCOUNTER — Other Ambulatory Visit: Payer: Self-pay

## 2020-10-12 ENCOUNTER — Emergency Department (HOSPITAL_COMMUNITY)
Admission: EM | Admit: 2020-10-12 | Discharge: 2020-10-12 | Disposition: A | Discharge status: Home / Self Care | Payer: Managed Care, Other (non HMO) | Attending: Emergency Medicine | Admitting: Emergency Medicine

## 2020-10-12 DIAGNOSIS — R42 Dizziness and giddiness: Secondary | ICD-10-CM | POA: Insufficient documentation

## 2020-10-12 DIAGNOSIS — R079 Chest pain, unspecified: Secondary | ICD-10-CM | POA: Diagnosis present

## 2020-10-12 DIAGNOSIS — Z7982 Long term (current) use of aspirin: Secondary | ICD-10-CM | POA: Diagnosis not present

## 2020-10-12 DIAGNOSIS — Z7984 Long term (current) use of oral hypoglycemic drugs: Secondary | ICD-10-CM | POA: Insufficient documentation

## 2020-10-12 DIAGNOSIS — Z794 Long term (current) use of insulin: Secondary | ICD-10-CM | POA: Diagnosis not present

## 2020-10-12 DIAGNOSIS — Z87891 Personal history of nicotine dependence: Secondary | ICD-10-CM | POA: Diagnosis not present

## 2020-10-12 DIAGNOSIS — N179 Acute kidney failure, unspecified: Secondary | ICD-10-CM | POA: Insufficient documentation

## 2020-10-12 DIAGNOSIS — I10 Essential (primary) hypertension: Secondary | ICD-10-CM | POA: Diagnosis not present

## 2020-10-12 DIAGNOSIS — Z79899 Other long term (current) drug therapy: Secondary | ICD-10-CM | POA: Diagnosis not present

## 2020-10-12 DIAGNOSIS — E1140 Type 2 diabetes mellitus with diabetic neuropathy, unspecified: Secondary | ICD-10-CM | POA: Diagnosis not present

## 2020-10-12 LAB — BASIC METABOLIC PANEL
Anion gap: 6 (ref 5–15)
BUN: 19 mg/dL (ref 6–20)
CO2: 26 mmol/L (ref 22–32)
Calcium: 8.9 mg/dL (ref 8.9–10.3)
Chloride: 101 mmol/L (ref 98–111)
Creatinine, Ser: 1.16 mg/dL — ABNORMAL HIGH (ref 0.44–1.00)
GFR, Estimated: 60 mL/min — ABNORMAL LOW (ref >60)
Glucose, Bld: 196 mg/dL — ABNORMAL HIGH (ref 70–99)
Potassium: 4.1 mmol/L (ref 3.5–5.1)
Sodium: 133 mmol/L — ABNORMAL LOW (ref 135–145)

## 2020-10-12 LAB — CBC
HCT: 33.9 % — ABNORMAL LOW (ref 36.0–46.0)
Hemoglobin: 11 g/dL — ABNORMAL LOW (ref 12.0–15.0)
MCH: 29.9 pg (ref 26.0–34.0)
MCHC: 32.4 g/dL (ref 30.0–36.0)
MCV: 92.1 fL (ref 80.0–100.0)
Platelets: 339 10*3/uL (ref 150–400)
RBC: 3.68 MIL/uL — ABNORMAL LOW (ref 3.87–5.11)
RDW: 13.2 % (ref 11.5–15.5)
WBC: 7.8 10*3/uL (ref 4.0–10.5)
nRBC: 0 % (ref 0.0–0.2)

## 2020-10-12 LAB — MAGNESIUM: Magnesium: 2.2 mg/dL (ref 1.7–2.4)

## 2020-10-12 LAB — TROPONIN I (HIGH SENSITIVITY)
Troponin I (High Sensitivity): 4 ng/L (ref <18)
Troponin I (High Sensitivity): 3 ng/L (ref <18)

## 2020-10-12 LAB — I-STAT BETA HCG BLOOD, ED (MC, WL, AP ONLY): I-stat hCG, quantitative: <5 m[IU]/mL (ref <5)

## 2020-10-12 LAB — D-DIMER, QUANTITATIVE: D-Dimer, Quant: <0.27 ug/mL-FEU (ref 0.00–0.50)

## 2020-10-12 MED ORDER — LACTATED RINGERS IV BOLUS
1000.0000 mL | Freq: Once | INTRAVENOUS | Status: AC
  Administered 2020-10-12: 1000 mL via INTRAVENOUS

## 2020-10-12 NOTE — ED Provider Notes (Signed)
[facility] EMERGENCY DEPARTMENT Provider Note   CSN: [id number] Arrival date & time: 10/12/20  0545     History Chief Complaint  Patient presents with   Chest Pain    Mariah Miller is a 45 y.o. female.   Chest Pain Associated symptoms: shortness of breath   Associated symptoms: no abdominal pain, no back pain, no cough, no fatigue, no fever, no headache, no nausea, no numbness, no palpitations, no vomiting and no weakness   Patient had onset of chest pain shortness of breath this morning.  Arrives via EMS.  EMS provided 324 ASA and 2 nitroglycerin tablets.  Onset of symptoms was 4 AM this morning.  At the time, patient was at work.  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  When she developed the symptoms, she went to the break room to rest.  She was instructed by her boss to come into the ED because she "did not look well".  She reports that she had similar symptoms last Thursday.  At that time, she was seen in this emergency department.  Work-up at that time was reassuring and plan was for close cardiology follow-up.  Patient has not been able to follow-up in the interim.  She believes that her symptoms may be related to stress and anxiety.  Patient's grandmother recently was hospitalized for stroke.  Her chest pain is described as left-sided and sharp.  It does radiate to the left side of her back.  It is worsened with deep inspiration.  She had some subjective shortness of breath but denies any other symptoms.  She reports that, since time of onset, her pain is diminished.  Currently it is only very mild.  Her risk factors for ACS include HTN, HLD, and DM.  She does not have a personal or family history of ACS.       Past Medical History:  Diagnosis Date   Depression    Diabetes mellitus without complication ([place])    Hypertension    Migraine    Seizures ([place])    self reported - last seizure was Oct 2019 "absent seizure"    Patient  Active Problem List   Diagnosis Date Noted   Migraine 10/01/2020   Seizures ([place]) 10/01/2020   Eczema 05/26/2017   Primary insomnia 09/28/2016   Anxiety and depression 05/26/2016   OSA (obstructive sleep apnea) 04/30/2015   Chest pain 04/28/2015   DM2 (diabetes mellitus, type 2) ([place]) 04/28/2015   ASCUS with positive high risk HPV cervical 06/06/2014   Anemia, iron deficiency 03/17/2014   Dyslipidemia 03/17/2014   Essential hypertension 03/17/2014   Uncontrolled diabetes mellitus ([place]) 06/17/2013   Hypoglycemia 04/08/2013   Diabetic neuropathy, painful ([place]) 03/21/2013   Other and unspecified hyperlipidemia 01/22/2013   A C DEGENERATION-CHRONIC 10/27/2008   SHOULDER PAIN 05/01/2008   IMPINGEMENT SYNDROME 05/01/2008    Past Surgical History:  Procedure Laterality Date   CHOLECYSTECTOMY     CYST EXCISION     SHOULDER OPEN ROTATOR CUFF REPAIR Left    TONSILLECTOMY     TUBAL LIGATION       OB History     Gravida  6   Para  3   Term  3   Preterm      AB  3   Living  3      SAB  2   IAB      Ectopic      Multiple  Live Births  3           Family History  Problem Relation Age of Onset   Hypertension Mother    Thyroid disease Mother    Irregular heart beat Mother    Bipolar disorder Sister    Colon cancer Other    Esophageal cancer Neg Hx    Rectal cancer Neg Hx    Stomach cancer Neg Hx     Social History   Tobacco Use   Smoking status: Former    Types: Cigarettes   Smokeless tobacco: Never   Tobacco comments:    Smoked for 4 years, quit age 27  Vaping Use   Vaping Use: Never used  Substance Use Topics   Alcohol use: Yes    Alcohol/week: 0.0 standard drinks    Comment: Holidays only   Drug use: No    Home Medications Prior to Admission medications   Medication Sig Start Date End Date Taking? Authorizing Provider  aspirin EC 81 MG tablet Take 1 tablet (81 mg total) by mouth daily. 01/22/13  Yes Dhungel, Nishant, MD   dapagliflozin propanediol (FARXIGA) 10 MG TABS tablet Take 1 tablet (10 mg total) by mouth daily before breakfast. 08/26/20  Yes McClung, Angela M, PA-C  ferrous sulfate 325 (65 FE) MG tablet Take 1 tablet (325 mg total) by mouth 2 (two) times daily with a meal. 04/24/17  Yes Newlin, Enobong, MD  gabapentin (NEURONTIN) 300 MG capsule Take 2 capsules (600 mg total) by mouth 3 (three) times daily. 10/01/20  Yes Newlin, Enobong, MD  insulin glargine-yfgn (SEMGLEE, YFGN,) 100 UNIT/ML Pen Inject 34 Units into the skin 2 (two) times daily. 10/07/20  Yes Newlin, Charlane Ferretti, MD  insulin lispro (HUMALOG KWIKPEN) 100 UNIT/ML KwikPen Use 0-12 units per sliding scale three times daily with meals. 10/07/20  Yes Newlin, Charlane Ferretti, MD  liraglutide (VICTOZA) 18 MG/3ML SOPN Inject 1.8 mg into the skin daily. 10/02/20  Yes Newlin, Charlane Ferretti, MD  lisinopril-hydrochlorothiazide (ZESTORETIC) 20-12.5 MG tablet TAKE 2 TABLETS BY MOUTH DAILY. 04/22/20 04/22/21 Yes Charlott Rakes, MD  metoprolol tartrate (LOPRESSOR) 50 MG tablet TAKE 1 TABLET (50 MG TOTAL) BY MOUTH 2 (TWO) TIMES DAILY. Patient taking differently: Take 50 mg by mouth 2 (two) times daily. 04/22/20 04/22/21 Yes Charlott Rakes, MD  promethazine (PHENERGAN) 25 MG tablet Take 1 tablet (25 mg total) by mouth every 6 (six) hours as needed for nausea or vomiting. 06/27/19  Yes Lawyer, Harrell Gave, PA-C  simvastatin (ZOCOR) 80 MG tablet Take 1 tablet (80 mg total) by mouth at bedtime. 10/01/20  Yes Charlott Rakes, MD  traZODone (DESYREL) 100 MG tablet Take 1 tablet (100 mg total) by mouth at bedtime as needed for sleep. 08/20/19  Yes Charlott Rakes, MD  Blood Glucose Monitoring Suppl (TRUE METRIX METER) w/Device KIT USE AS DIRECTED Patient taking differently: 1 each by Other route as directed. 05/16/18   Charlott Rakes, MD  Clotrimazole 1 % OINT Apply 1 each topically 2 (two) times daily. Patient not taking: No sig reported 02/04/19   Charlott Rakes, MD  glucose blood (TRUE METRIX  BLOOD GLUCOSE TEST) test strip Use as instructed Patient taking differently: 1 each by Other route as directed. 05/16/18   Charlott Rakes, MD  ibuprofen (ADVIL) 600 MG tablet Take 1 tablet (600 mg total) by mouth every 8 (eight) hours as needed. Patient not taking: No sig reported 08/21/20   Caccavale, Sophia, PA-C  Insulin Pen Needle 32G X 4 MM MISC USE AS INSTRUCTED TO INJECT INSULIN  AND VICTOZA DAILY. 04/21/20 04/21/21  Charlott Rakes, MD  Insulin Syringe-Needle U-100 31G X 5/16" 0.3 ML MISC USE TO INJECT INSULIN NOVOLOG TID. 04/21/20 04/21/21  Charlott Rakes, MD  metFORMIN (GLUCOPHAGE) 1000 MG tablet Take 1 tablet (1,000 mg total) by mouth 2 (two) times daily with a meal. 10/01/20   Charlott Rakes, MD  metroNIDAZOLE (METROGEL) 0.75 % vaginal gel PLACE 1 APPLICATORFUL VAGINALLY AT BEDTIME. Patient not taking: No sig reported 04/23/20 04/23/21  Charlott Rakes, MD  sucralfate (CARAFATE) 1 g tablet Take 1 tablet (1 g total) by mouth 4 (four) times daily -  with meals and at bedtime. Patient not taking: No sig reported 06/18/18   Muthersbaugh, Jarrett Soho, PA-C  TRUEplus Lancets 28G MISC USE AS INSTRUCTED Patient taking differently: 1 each by Other route as directed. 05/16/18   Charlott Rakes, MD    Allergies    Tomato  Review of Systems   Review of Systems  Constitutional:  Negative for activity change, appetite change, chills, fatigue and fever.  HENT:  Negative for congestion, ear pain, rhinorrhea and sore throat.   Eyes:  Negative for pain and visual disturbance.  Respiratory:  Positive for shortness of breath. Negative for cough, chest tightness, wheezing and stridor.   Cardiovascular:  Positive for chest pain. Negative for palpitations and leg swelling.  Gastrointestinal:  Negative for abdominal pain, nausea and vomiting.  Genitourinary:  Negative for menstrual problem and pelvic pain.  Musculoskeletal:  Negative for arthralgias, back pain, joint swelling, myalgias and neck pain.  Skin:   Negative for color change and rash.  Neurological:  Positive for light-headedness. Negative for seizures, syncope, speech difficulty, weakness, numbness and headaches.  Hematological:  Does not bruise/bleed easily.  Psychiatric/Behavioral:  Negative for confusion and decreased concentration.   All other systems reviewed and are negative.  Physical Exam Updated Vital Signs BP 136/64   Pulse 82   Temp 98.6 F (37 C) (Oral)   Resp 17   Ht $R'5\' 5"'mc$  (1.651 m)   Wt 103 kg   LMP 10/06/2020   SpO2 100%   BMI 37.79 kg/m   Physical Exam Vitals and nursing note reviewed.  Constitutional:      General: She is not in acute distress.    Appearance: She is well-developed. She is not ill-appearing, toxic-appearing or diaphoretic.  HENT:     Head: Normocephalic and atraumatic.  Eyes:     Conjunctiva/sclera: Conjunctivae normal.  Neck:     Vascular: No JVD.  Cardiovascular:     Rate and Rhythm: Normal rate and regular rhythm.     Heart sounds: No murmur heard. Pulmonary:     Effort: Pulmonary effort is normal. No tachypnea, accessory muscle usage or respiratory distress.     Breath sounds: Normal breath sounds. No decreased breath sounds, wheezing, rhonchi or rales.  Chest:     Chest wall: No tenderness.  Abdominal:     Palpations: Abdomen is soft.     Tenderness: There is no abdominal tenderness.  Musculoskeletal:     Cervical back: Neck supple.     Right lower leg: No edema.     Left lower leg: No edema.  Skin:    General: Skin is warm and dry.     Capillary Refill: Capillary refill takes less than 2 seconds.  Neurological:     General: No focal deficit present.     Mental Status: She is alert and oriented to person, place, and time.     Cranial Nerves: No cranial  nerve deficit.     Motor: No weakness.  Psychiatric:        Mood and Affect: Mood normal.        Behavior: Behavior normal.    ED Results / Procedures / Treatments   Labs (all labs ordered are listed, but only  abnormal results are displayed) Labs Reviewed  BASIC METABOLIC PANEL - Abnormal; Notable for the following components:      Result Value   Sodium 133 (*)    Glucose, Bld 196 (*)    Creatinine, Ser 1.16 (*)    GFR, Estimated 60 (*)    All other components within normal limits  CBC - Abnormal; Notable for the following components:   RBC 3.68 (*)    Hemoglobin 11.0 (*)    HCT 33.9 (*)    All other components within normal limits  D-DIMER, QUANTITATIVE  MAGNESIUM  I-STAT BETA HCG BLOOD, ED ([facility], [facility], [facility] ONLY)  TROPONIN I (HIGH SENSITIVITY)  TROPONIN I (HIGH SENSITIVITY)    EKG None  Radiology DG Chest 2 View  Result Date: 10/12/2020 CLINICAL DATA:  Shortness of breath and left chest pain EXAM: CHEST - 2 VIEW COMPARISON:  10/01/2020 FINDINGS: The lungs appear clear. Cardiac and mediastinal contours normal. No pleural effusion identified. Mild thoracic spondylosis. IMPRESSION: No active cardiopulmonary disease. Electronically Signed   By: Van Clines M.D.   On: 10/12/2020 06:40    Procedures Procedures   Medications Ordered in ED Medications  lactated ringers bolus 1,000 mL (0 mLs Intravenous Stopped 10/12/20 1117)    ED Course  I have reviewed the triage vital signs and the nursing notes.  Pertinent labs & imaging results that were available during my care of the patient were reviewed by me and considered in my medical decision making (see chart for details).    MDM Rules/Calculators/A&P                           Patient presents for onset of chest pain, shortness of breath and lightheadedness.  Symptoms occurred at work at 4 AM.  Patient rested in the break room but was instructed to come to the ED by her boss.  Prior to arrival, patient got 324 of ASA and 2 sublingual nitroglycerin.  Upon arrival, patient reports that her symptoms have nearly resolved.  Patient was seen last Thursday in the ED for similar symptoms.  She underwent a work-up and was advised to follow-up  with cardiology.  She has not followed up with her cardiologist.  Prior to being bedded in the ED, lab work was obtained which was notable for an AKI (baseline creatinine of 0.8, currently elevated to 1.19).  Patient feels that this may be secondary to dehydration.  She reports that her work environment is very hot and humid and she has difficult time replacing fluid loss.  We will give 1 L of IV fluids.  Given her pleuritic nature of chest pain, D-dimer to be obtained. D-dimer was negative.  Patient had continued resolution of symptoms while in the ED.  Patient was informed of her laboratory results, including her bump in creatinine.Sure decision-making discussion was had with patient.  Patient states that she does not want inpatient care right now. She  would prefer to hydrate at home and get her creatinine checked within the next few days as an outpatient.  She was advised to do so in the next 2 days.  Patient was also advised to  discuss lisinopril use with her primary care doctor.  She was also instructed to follow-up with cardiology, as planned.  She was encouraged to return to the ED for any new or worsening symptoms.  Patient was discharged in stable condition.  Final Clinical Impression(s) / ED Diagnoses Final diagnoses:  Chest pain, unspecified type  AKI (acute kidney injury) [facility])  Essential hypertension    Rx / DC Orders ED Discharge Orders     None        Godfrey Pick, MD 10/12/20 1934

## 2020-10-12 NOTE — ED Notes (Signed)
Nurse is starting an IV AND SHE WILL GET THE MORNING LABS

## 2020-10-12 NOTE — Discharge Instructions (Addendum)
Follow-up with your cardiologist, as planned.  If you are able to schedule an appointment with your primary care doctor, do so soon as possible.  You have an injury to your kidneys.  Over the next few days you should drink plenty of fluids.  You should get your creatinine checked within the next 2 days.  This is a marker in your blood that measures kidney function.  Until your kidneys are back to normal, avoid taking your Zestoretic.  Discuss all of these things with your primary care doctor when you are able to see him/her, hopefully soon as possible.  Please return to the emergency department for any new or worsening symptoms.

## 2020-10-12 NOTE — ED Notes (Signed)
Attempted IV start x 2. Asked another RN to attempt IV start

## 2020-10-12 NOTE — ED Triage Notes (Signed)
Patient arrived with EMS from work reports central chest pain with SOB this morning , no emesis or diaphoresis , denies cough or fever . She received ASA 324 mg by EMS and 2 NTG sl prior to arrival .

## 2020-10-15 ENCOUNTER — Telehealth: Payer: Self-pay | Admitting: Family Medicine

## 2020-10-15 NOTE — Telephone Encounter (Signed)
I have reviewed her ED visit including her labs.  Labs can be done at her upcoming visit.

## 2020-10-15 NOTE — Telephone Encounter (Signed)
Copied from CRM [id number]. Topic: Appointment Scheduling - Scheduling Inquiry for Clinic >> Oct 13, 2020 10:22 AM Elliot Gault wrote: Patient was seen in the ED 10/12/2020 and was advised to follow up with her PCP and request to have her kidney check for dehydration within 2 days. Patient requesting lab orders and a follow up call as soon as possible   I called patient and left voicemail that she has an appointment with Dr. Alvis Lemmings on 8/24. Would should need something sooner for this matter?

## 2020-10-15 NOTE — Telephone Encounter (Signed)
Can patient just have lab orders and do her follow up at her 8/24 appointment.

## 2020-10-15 NOTE — Telephone Encounter (Signed)
Pt missed call from office, requesting call back. Tried calling office 2x

## 2020-10-28 ENCOUNTER — Ambulatory Visit: Payer: Managed Care, Other (non HMO) | Attending: Family Medicine | Admitting: Family Medicine

## 2020-10-28 ENCOUNTER — Other Ambulatory Visit: Payer: Self-pay

## 2020-10-28 ENCOUNTER — Telehealth: Payer: Self-pay

## 2020-10-28 ENCOUNTER — Encounter: Payer: Self-pay | Admitting: Family Medicine

## 2020-10-28 VITALS — BP 142/82 | HR 87 | Ht 65.0 in | Wt 213.0 lb

## 2020-10-28 DIAGNOSIS — I152 Hypertension secondary to endocrine disorders: Secondary | ICD-10-CM

## 2020-10-28 DIAGNOSIS — E1142 Type 2 diabetes mellitus with diabetic polyneuropathy: Secondary | ICD-10-CM

## 2020-10-28 DIAGNOSIS — E1159 Type 2 diabetes mellitus with other circulatory complications: Secondary | ICD-10-CM

## 2020-10-28 DIAGNOSIS — Z794 Long term (current) use of insulin: Secondary | ICD-10-CM

## 2020-10-28 DIAGNOSIS — Z23 Encounter for immunization: Secondary | ICD-10-CM

## 2020-10-28 DIAGNOSIS — M25562 Pain in left knee: Secondary | ICD-10-CM

## 2020-10-28 LAB — POCT GLYCOSYLATED HEMOGLOBIN (HGB A1C): HbA1c, POC (controlled diabetic range): 10.5 % — AB (ref 0.0–7.0)

## 2020-10-28 LAB — GLUCOSE, POCT (MANUAL RESULT ENTRY): POC Glucose: 155 mg/dl — AB (ref 70–99)

## 2020-10-28 MED ORDER — OZEMPIC (0.25 OR 0.5 MG/DOSE) 2 MG/1.5ML ~~LOC~~ SOPN
0.5000 mg | PEN_INJECTOR | SUBCUTANEOUS | 6 refills | Status: DC
  Filled 2020-10-28: qty 1.5, 28d supply, fill #0

## 2020-10-28 MED ORDER — PREDNISONE 20 MG PO TABS
20.0000 mg | ORAL_TABLET | Freq: Every day | ORAL | 0 refills | Status: DC
  Filled 2020-10-28: qty 5, 5d supply, fill #0

## 2020-10-28 MED ORDER — TRAMADOL HCL 50 MG PO TABS
50.0000 mg | ORAL_TABLET | Freq: Two times a day (BID) | ORAL | 0 refills | Status: AC | PRN
  Filled 2020-10-28: qty 20, 10d supply, fill #0

## 2020-10-28 MED ORDER — INSULIN GLARGINE-YFGN 100 UNIT/ML ~~LOC~~ SOPN
40.0000 [IU] | PEN_INJECTOR | Freq: Two times a day (BID) | SUBCUTANEOUS | 3 refills | Status: DC
Start: 2020-10-28 — End: 2021-03-17
  Filled 2020-10-28: qty 24, 30d supply, fill #0
  Filled 2020-10-28: qty 30, 34d supply, fill #0
  Filled 2021-02-01: qty 30, 34d supply, fill #1

## 2020-10-28 NOTE — Patient Instructions (Signed)
Acute Knee Pain, Adult Acute knee pain is sudden and may be caused by damage, swelling, or irritation of the muscles and tissues that support the knee. Pain may result from: A fall. An injury to the knee from twisting motions. A hit to the knee. Infection. Acute knee pain may go away on its own with time and rest. If it does not, your health care provider may order tests to find the cause of the pain. These may include: Imaging tests, such as an X-ray, MRI, CT scan, or ultrasound. Joint aspiration. In this test, fluid is removed from the knee and evaluated. Arthroscopy. In this test, a lighted tube is inserted into the knee and an image is projected onto a TV screen. Biopsy. In this test, a sample of tissue is removed from the body and studied under a microscope. Follow these instructions at home: If you have a knee sleeve or brace:  Wear the knee sleeve or brace as told by your health care provider. Remove it only as told by your health care provider. Loosen it if your toes tingle, become numb, or turn cold and blue. Keep it clean. If the knee sleeve or brace is not waterproof: Do not let it get wet. Cover it with a watertight covering when you take a bath or shower.  Activity Rest your knee. Do not do things that cause pain or make pain worse. Avoid high-impact activities or exercises, such as running, jumping rope, or doing jumping jacks. Work with a physical therapist to make a safe exercise program, as recommended by your health care provider. Do exercises as told by your physical therapist. Managing pain, stiffness, and swelling  If directed, put ice on the affected knee. To do this: If you have a removable knee sleeve or brace, remove it as told by your health care provider. Put ice in a plastic bag. Place a towel between your skin and the bag. Leave the ice on for 20 minutes, 2-3 times a day. Remove the ice if your skin turns bright red. This is very important. If you cannot  feel pain, heat, or cold, you have a greater risk of damage to the area. If directed, use an elastic bandage to put pressure (compression) on your injured knee. This may control swelling, give support, and help with discomfort. Raise (elevate) your knee above the level of your heart while you are sitting or lying down. Sleep with a pillow under your knee.  General instructions Take over-the-counter and prescription medicines only as told by your health care provider. Do not use any products that contain nicotine or tobacco, such as cigarettes, e-cigarettes, and chewing tobacco. If you need help quitting, ask your health care provider. If you are overweight, work with your health care provider and a dietitian to set a weight-loss goal that is healthy and reasonable for you. Extra weight can put pressure on your knee. Pay attention to any changes in your symptoms. Keep all follow-up visits. This is important. Contact a health care provider if: Your knee pain continues, changes, or gets worse. You have a fever along with knee pain. Your knee feels warm to the touch or is red. Your knee buckles or locks up. Get help right away if: Your knee swells, and the swelling becomes worse. You cannot move your knee. You have severe pain in your knee that cannot be managed with pain medicine. Summary Acute knee pain can be caused by a fall, an injury, an infection, or damage, swelling,   or irritation of the tissues that support your knee. Your health care provider may perform tests to find out the cause of the pain. Pay attention to any changes in your symptoms. Relieve your pain with rest, medicines, light activity, and the use of ice. Get help right away if your knee swells, you cannot move your knee, or you have severe pain that cannot be managed with medicine. This information is not intended to replace advice given to you by your health care provider. Make sure you discuss any questions you have with  your healthcare provider. Document Revised: 08/07/2019 Document Reviewed: 08/07/2019 Elsevier Patient Education  2022 [employer]  

## 2020-10-28 NOTE — Progress Notes (Signed)
HFU: she has had two recent visit to discuss.  Swelling and pain in left knee.

## 2020-10-28 NOTE — Progress Notes (Signed)
Subjective:  Patient ID: Mariah Miller, female    DOB: 1976-01-08  Age: 45 y.o. MRN: [id number]  CC: Diabetes   HPI Mariah Miller is a 45 y.o. year old female with a history of type 2 diabetes mellitus (A1c 10.5), hypertension, depression.  Interval History: For the last 2 days her L knee has been painful with associated swelling and bending causes her to feel like something is squeezing. She sometimes has to use her R knee to push off.  With regards to her diabetes mellitus her A1c is 10.5 and she endorses compliance with her diabetic regimen.  She has had blood sugars of up to 400.  Denies hypoglycemic episodes; neuropathy is controlled on gabapentin. Her Ophthalmologist is at [facility] and she needs to call to schedule an annual eye exam.  She had an ED visit for chest pain 2 weeks ago and was referred to cardiology from she does not get to see until 12/2020.  She denies presence of chest pain at this time Uvaldo Bristle inform she will be placed on the wait list for an earlier appointment.  At her ED visit it was noted that her creatinine has trended up slightly to 1.16 from 0.79 one year ago. Past Medical History:  Diagnosis Date   Depression    Diabetes mellitus without complication ([place])    Hypertension    Migraine    Seizures ([place])    self reported - last seizure was Oct 2019 "absent seizure"    Past Surgical History:  Procedure Laterality Date   CHOLECYSTECTOMY     CYST EXCISION     SHOULDER OPEN ROTATOR CUFF REPAIR Left    TONSILLECTOMY     TUBAL LIGATION      Family History  Problem Relation Age of Onset   Hypertension Mother    Thyroid disease Mother    Irregular heart beat Mother    Bipolar disorder Sister    Colon cancer Other    Esophageal cancer Neg Hx    Rectal cancer Neg Hx    Stomach cancer Neg Hx     Allergies  Allergen Reactions   Tomato Itching and Rash    Outpatient Medications Prior to Visit  Medication Sig Dispense Refill   aspirin EC 81  MG tablet Take 1 tablet (81 mg total) by mouth daily. 30 tablet 5   Blood Glucose Monitoring Suppl (TRUE METRIX METER) w/Device KIT USE AS DIRECTED (Patient taking differently: 1 each by Other route as directed.) 1 kit 0   dapagliflozin propanediol (FARXIGA) 10 MG TABS tablet Take 1 tablet (10 mg total) by mouth daily before breakfast. 30 tablet 6   ferrous sulfate 325 (65 FE) MG tablet Take 1 tablet (325 mg total) by mouth 2 (two) times daily with a meal. 180 tablet 3   gabapentin (NEURONTIN) 300 MG capsule Take 2 capsules (600 mg total) by mouth 3 (three) times daily. 120 capsule 6   glucose blood (TRUE METRIX BLOOD GLUCOSE TEST) test strip Use as instructed (Patient taking differently: 1 each by Other route as directed.) 100 each 12   insulin lispro (HUMALOG KWIKPEN) 100 UNIT/ML KwikPen Use 0-12 units per sliding scale three times daily with meals. 15 mL 2   Insulin Pen Needle 32G X 4 MM MISC USE AS INSTRUCTED TO INJECT INSULIN AND VICTOZA DAILY. 100 each 0   Insulin Syringe-Needle U-100 31G X 5/16" 0.3 ML MISC USE TO INJECT INSULIN NOVOLOG TID. 100 each 0   lisinopril-hydrochlorothiazide (ZESTORETIC) 20-12.5  MG tablet TAKE 2 TABLETS BY MOUTH DAILY. 180 tablet 1   metFORMIN (GLUCOPHAGE) 1000 MG tablet Take 1 tablet (1,000 mg total) by mouth 2 (two) times daily with a meal. 180 tablet 1   metoprolol tartrate (LOPRESSOR) 50 MG tablet TAKE 1 TABLET (50 MG TOTAL) BY MOUTH 2 (TWO) TIMES DAILY. (Patient taking differently: Take 50 mg by mouth 2 (two) times daily.) 60 tablet 6   simvastatin (ZOCOR) 80 MG tablet Take 1 tablet (80 mg total) by mouth at bedtime. 30 tablet 6   traZODone (DESYREL) 100 MG tablet Take 1 tablet (100 mg total) by mouth at bedtime as needed for sleep. 30 tablet 3   TRUEplus Lancets 28G MISC USE AS INSTRUCTED (Patient taking differently: 1 each by Other route as directed.) 100 each 5   insulin glargine-yfgn (SEMGLEE, YFGN,) 100 UNIT/ML Pen Inject 34 Units into the skin 2 (two) times  daily. 15 mL 2   liraglutide (VICTOZA) 18 MG/3ML SOPN Inject 1.8 mg into the skin daily. 9 mL 1   Clotrimazole 1 % OINT Apply 1 each topically 2 (two) times daily. (Patient not taking: No sig reported) 56.7 g 0   ibuprofen (ADVIL) 600 MG tablet Take 1 tablet (600 mg total) by mouth every 8 (eight) hours as needed. (Patient not taking: No sig reported) 10 tablet 0   metroNIDAZOLE (METROGEL) 0.75 % vaginal gel PLACE 1 APPLICATORFUL VAGINALLY AT BEDTIME. (Patient not taking: No sig reported) 70 g 0   promethazine (PHENERGAN) 25 MG tablet Take 1 tablet (25 mg total) by mouth every 6 (six) hours as needed for nausea or vomiting. (Patient not taking: Reported on 10/28/2020) 10 tablet 0   sucralfate (CARAFATE) 1 g tablet Take 1 tablet (1 g total) by mouth 4 (four) times daily -  with meals and at bedtime. (Patient not taking: No sig reported) 60 tablet 0   No facility-administered medications prior to visit.     ROS Review of Systems  Constitutional:  Negative for activity change, appetite change and fatigue.  HENT:  Negative for congestion, sinus pressure and sore throat.   Eyes:  Negative for visual disturbance.  Respiratory:  Negative for cough, chest tightness, shortness of breath and wheezing.   Cardiovascular:  Negative for chest pain and palpitations.  Gastrointestinal:  Negative for abdominal distention, abdominal pain and constipation.  Endocrine: Negative for polydipsia.  Genitourinary:  Negative for dysuria and frequency.  Musculoskeletal:  Negative for arthralgias and back pain.  Skin:  Negative for rash.  Neurological:  Negative for tremors, light-headedness and numbness.  Hematological:  Does not bruise/bleed easily.  Psychiatric/Behavioral:  Negative for agitation and behavioral problems.    Objective:  BP (!) 142/82   Pulse 87   Ht $R'5\' 5"'jf$  (1.651 m)   Wt 213 lb (96.6 kg)   LMP 10/06/2020   SpO2 100%   BMI 35.45 kg/m   BP/Weight 10/28/2020 10/12/2020 4/58/0998  Systolic BP  338 250 539  Diastolic BP 82 64 74  Wt. (Lbs) 213 227.07 204  BMI 35.45 37.79 33.95      Physical Exam Constitutional:      Appearance: She is well-developed. She is obese.  Cardiovascular:     Rate and Rhythm: Normal rate.     Heart sounds: Normal heart sounds. No murmur heard. Pulmonary:     Effort: Pulmonary effort is normal.     Breath sounds: Normal breath sounds. No wheezing or rales.  Chest:     Chest wall: No tenderness.  Abdominal:     General: Bowel sounds are normal. There is no distension.     Palpations: Abdomen is soft. There is no mass.     Tenderness: There is no abdominal tenderness.  Musculoskeletal:        General: Normal range of motion.     Right lower leg: No edema.     Left lower leg: No edema.     Comments: Left knee superolateral edema with associated tenderness on range of motion and palpation.  Associated crepitus on range of motion. Partial weightbearing with associated pain  Neurological:     Mental Status: She is alert and oriented to person, place, and time.  Psychiatric:        Mood and Affect: Mood normal.    CMP Latest Ref Rng & Units 10/12/2020 10/01/2020 08/20/2020  Glucose 70 - 99 mg/dL 196(H) 409(H) 119(H)  BUN 6 - 20 mg/dL _0 Creatinine 0.44 - 1.00 mg/dL 1.16(H) 0.79 0.79  Sodium 135 - 145 mmol/L 133(L) 131(L) 138  Potassium 3.5 - 5.1 mmol/L 4.1 4.7 3.8  Chloride 98 - 111 mmol/L 101 104 107  CO2 22 - 32 mmol/L _1 Calcium 8.9 - 10.3 mg/dL 8.9 8.6(L) 8.7(L)  Total Protein 6.5 - 8.1 g/dL - 6.4(L) -  Total Bilirubin 0.3 - 1.2 mg/dL - 0.6 -  Alkaline Phos 38 - 126 U/L - 76 -  AST 15 - 41 U/L - 17 -  ALT 0 - 44 U/L - 14 -    Lipid Panel     Component Value Date/Time   CHOL 190 04/22/2020 1037   TRIG 118 04/22/2020 1037   HDL 40 04/22/2020 1037   CHOLHDL 4.8 (H) 04/22/2020 1037   CHOLHDL 4.7 05/25/2016 1543   VLDL 37 (H) 05/25/2016 1543   LDLCALC 129 (H) 04/22/2020 1037    CBC    Component Value Date/Time   WBC  7.8 10/12/2020 0554   RBC 3.68 (L) 10/12/2020 0554   HGB 11.0 (L) 10/12/2020 0554   HCT 33.9 (L) 10/12/2020 0554   PLT 339 10/12/2020 0554   MCV 92.1 10/12/2020 0554   MCH 29.9 10/12/2020 0554   MCHC 32.4 10/12/2020 0554   RDW 13.2 10/12/2020 0554   LYMPHSABS 1.9 10/01/2020 0835   MONOABS 0.5 10/01/2020 0835   EOSABS 0.1 10/01/2020 0835   BASOSABS 0.1 10/01/2020 0835    Lab Results  Component Value Date   HGBA1C 10.5 (A) 10/28/2020    Assessment & Plan:  1. Type 2 diabetes mellitus with diabetic polyneuropathy, with long-term current use of insulin (HCC) Uncontrolled with A1c of 10.5; goal <7.0 Increased insulin from 34 to 40 units Switched from Victoza to [employer] She will follow up with the PharmD for titration of Ozempic Counseled on Diabetic diet, my plate method, 409 minutes of moderate intensity exercise/week Blood sugar logs with fasting goals of 80-120 mg/dl, random of less than 180 and in the event of sugars less than 60 mg/dl or greater than 400 mg/dl encouraged to notify the clinic. Advised on the need for annual eye exams, annual foot exams, Pneumonia vaccine. - POCT glucose (manual entry) - POCT glycosylated hemoglobin (Hb W1X) - Basic Metabolic Panel - Lipid panel - insulin glargine-yfgn (SEMGLEE, YFGN,) 100 UNIT/ML Pen; Inject 40 Units into the skin 2 (two) times daily.  Dispense: 30 mL; Refill: 3 - Semaglutide,0.25 or 0.5MG/DOS, (OZEMPIC, 0.25 OR 0.5 MG/DOSE,) 2 MG/1.5ML SOPN; Inject 0.5 mg into the skin once a  week.  Dispense: 2 mL; Refill: 6  2. Acute pain of left knee No preceding trauma Obtain knee brace, apply ice If symptoms continue for >2 weeks consider imaging - traMADol (ULTRAM) 50 MG tablet; Take 1 tablet (50 mg total) by mouth every 12 (twelve) hours as needed for up to 5 days.  Dispense: 20 tablet; Refill: 0 - predniSONE (DELTASONE) 20 MG tablet; Take 1 tablet (20 mg total) by mouth daily with breakfast.  Dispense: 5 tablet; Refill: 0  3. Need  for immunization against influenza - Flu Vaccine QUAD 51moIM (Fluarix, Fluzone & Alfiuria Quad PF)  4. Hy[place] with diabetes [place] Slightly above goal No regimen change today given previous BP was normal. Counseled on blood pressure goal of less than 130/80, low-sodium, DASH diet, medication compliance, 150 minutes of moderate intensity exercise per week. Discussed medication compliance, adverse effects.    Health Care Maintenance: Flu shot today; she will call her ophthalmologist to schedule eye exam Meds ordered this encounter  Medications   insulin glargine-yfgn (SEMGLEE, YFGN,) 100 UNIT/ML Pen    Sig: Inject 40 Units into the skin 2 (two) times daily.    Dispense:  30 mL    Refill:  3    Dose increase   traMADol (ULTRAM) 50 MG tablet    Sig: Take 1 tablet (50 mg total) by mouth every 12 (twelve) hours as needed for up to 5 days.    Dispense:  20 tablet    Refill:  0   predniSONE (DELTASONE) 20 MG tablet    Sig: Take 1 tablet (20 mg total) by mouth daily with breakfast.    Dispense:  5 tablet    Refill:  0   Semaglutide,0.25 or 0.5MG/DOS, (OZEMPIC, 0.25 OR 0.5 MG/DOSE,) 2 MG/1.5ML SOPN    Sig: Inject 0.5 mg into the skin once a week.    Dispense:  2 mL    Refill:  6     Follow-up: [facility]28[facility]2020[facility] PCP 4 months-diabetes.       ECharlott Rakes MD, FAAFP. [facility]sc[facility] [place]  10/28/2020, 12:28 PM

## 2020-10-28 NOTE — Telephone Encounter (Signed)
Patient's ins prefers Byetta or Trulicity.  If either is appropriate can the Ozempic be changed?

## 2020-10-29 ENCOUNTER — Other Ambulatory Visit: Payer: Self-pay | Admitting: Family Medicine

## 2020-10-29 ENCOUNTER — Other Ambulatory Visit: Payer: Self-pay

## 2020-10-29 LAB — LIPID PANEL
Chol/HDL Ratio: 4.3 ratio (ref 0.0–4.4)
Cholesterol, Total: 173 mg/dL (ref 100–199)
HDL: 40 mg/dL (ref >39)
LDL Chol Calc (NIH): 112 mg/dL — ABNORMAL HIGH (ref 0–99)
Triglycerides: 116 mg/dL (ref 0–149)
VLDL Cholesterol Cal: 21 mg/dL (ref 5–40)

## 2020-10-29 LAB — BASIC METABOLIC PANEL
BUN/Creatinine Ratio: 14 (ref 9–23)
BUN: 13 mg/dL (ref 6–24)
CO2: 22 mmol/L (ref 20–29)
Calcium: 9.2 mg/dL (ref 8.7–10.2)
Chloride: 102 mmol/L (ref 96–106)
Creatinine, Ser: 0.92 mg/dL (ref 0.57–1.00)
Glucose: 120 mg/dL — ABNORMAL HIGH (ref 65–99)
Potassium: 4.8 mmol/L (ref 3.5–5.2)
Sodium: 138 mmol/L (ref 134–144)
eGFR: 79 mL/min/{1.73_m2} (ref >59)

## 2020-10-29 MED ORDER — TRULICITY 1.5 MG/0.5ML ~~LOC~~ SOAJ
1.5000 mg | SUBCUTANEOUS | 6 refills | Status: DC
  Filled 2020-10-29 (×2): qty 2, 28d supply, fill #0
  Filled 2020-11-24 – 2020-12-01 (×2): qty 2, 28d supply, fill #1
  Filled 2020-12-27 – 2020-12-31 (×2): qty 2, 28d supply, fill #2
  Filled 2021-01-22: qty 2, 28d supply, fill #3
  Filled 2021-02-26: qty 2, 28d supply, fill #4
  Filled 2021-03-24 – 2021-03-29 (×2): qty 2, 28d supply, fill #0
  Filled 2021-04-26: qty 2, 28d supply, fill #1
  Filled 2021-04-27: qty 2, 28d supply, fill #0

## 2020-10-29 MED ORDER — ATORVASTATIN CALCIUM 80 MG PO TABS
80.0000 mg | ORAL_TABLET | Freq: Every day | ORAL | 6 refills | Status: DC
  Filled 2020-10-29 (×2): qty 30, 30d supply, fill #0
  Filled 2021-02-07: qty 30, 30d supply, fill #1

## 2020-10-29 NOTE — Addendum Note (Signed)
Addended by: Hoy Register on: 10/29/2020 12:28 PM   Modules accepted: Orders

## 2020-10-29 NOTE — Telephone Encounter (Signed)
Trulicity rx sent to Pharmacy

## 2020-11-03 ENCOUNTER — Emergency Department (HOSPITAL_COMMUNITY)
Admission: EM | Admit: 2020-11-03 | Discharge: 2020-11-03 | Disposition: A | Discharge status: Home / Self Care | Payer: Managed Care, Other (non HMO) | Attending: Emergency Medicine | Admitting: Emergency Medicine

## 2020-11-03 ENCOUNTER — Encounter (HOSPITAL_COMMUNITY): Payer: Self-pay | Admitting: Emergency Medicine

## 2020-11-03 ENCOUNTER — Emergency Department (HOSPITAL_COMMUNITY): Payer: Managed Care, Other (non HMO)

## 2020-11-03 ENCOUNTER — Other Ambulatory Visit: Payer: Self-pay

## 2020-11-03 DIAGNOSIS — E11649 Type 2 diabetes mellitus with hypoglycemia without coma: Secondary | ICD-10-CM | POA: Diagnosis not present

## 2020-11-03 DIAGNOSIS — E785 Hyperlipidemia, unspecified: Secondary | ICD-10-CM | POA: Insufficient documentation

## 2020-11-03 DIAGNOSIS — Z7984 Long term (current) use of oral hypoglycemic drugs: Secondary | ICD-10-CM | POA: Diagnosis not present

## 2020-11-03 DIAGNOSIS — Z79899 Other long term (current) drug therapy: Secondary | ICD-10-CM | POA: Diagnosis not present

## 2020-11-03 DIAGNOSIS — N9489 Other specified conditions associated with female genital organs and menstrual cycle: Secondary | ICD-10-CM | POA: Insufficient documentation

## 2020-11-03 DIAGNOSIS — I10 Essential (primary) hypertension: Secondary | ICD-10-CM | POA: Diagnosis not present

## 2020-11-03 DIAGNOSIS — E1140 Type 2 diabetes mellitus with diabetic neuropathy, unspecified: Secondary | ICD-10-CM | POA: Diagnosis not present

## 2020-11-03 DIAGNOSIS — Z794 Long term (current) use of insulin: Secondary | ICD-10-CM | POA: Diagnosis not present

## 2020-11-03 DIAGNOSIS — E1169 Type 2 diabetes mellitus with other specified complication: Secondary | ICD-10-CM | POA: Diagnosis not present

## 2020-11-03 DIAGNOSIS — Z7982 Long term (current) use of aspirin: Secondary | ICD-10-CM | POA: Diagnosis not present

## 2020-11-03 DIAGNOSIS — R109 Unspecified abdominal pain: Secondary | ICD-10-CM | POA: Diagnosis present

## 2020-11-03 DIAGNOSIS — R1110 Vomiting, unspecified: Secondary | ICD-10-CM | POA: Insufficient documentation

## 2020-11-03 DIAGNOSIS — Z87891 Personal history of nicotine dependence: Secondary | ICD-10-CM | POA: Insufficient documentation

## 2020-11-03 DIAGNOSIS — N179 Acute kidney failure, unspecified: Secondary | ICD-10-CM

## 2020-11-03 DIAGNOSIS — K5900 Constipation, unspecified: Secondary | ICD-10-CM

## 2020-11-03 LAB — CBC
HCT: 37.8 % (ref 36.0–46.0)
Hemoglobin: 12.3 g/dL (ref 12.0–15.0)
MCH: 29.6 pg (ref 26.0–34.0)
MCHC: 32.5 g/dL (ref 30.0–36.0)
MCV: 90.9 fL (ref 80.0–100.0)
Platelets: 417 10*3/uL — ABNORMAL HIGH (ref 150–400)
RBC: 4.16 MIL/uL (ref 3.87–5.11)
RDW: 13.3 % (ref 11.5–15.5)
WBC: 8 10*3/uL (ref 4.0–10.5)
nRBC: 0 % (ref 0.0–0.2)

## 2020-11-03 LAB — I-STAT BETA HCG BLOOD, ED (MC, WL, AP ONLY): I-stat hCG, quantitative: <5 m[IU]/mL (ref <5)

## 2020-11-03 LAB — COMPREHENSIVE METABOLIC PANEL
ALT: 14 U/L (ref 0–44)
AST: 19 U/L (ref 15–41)
Albumin: 3.6 g/dL (ref 3.5–5.0)
Alkaline Phosphatase: 73 U/L (ref 38–126)
Anion gap: 10 (ref 5–15)
BUN: 23 mg/dL — ABNORMAL HIGH (ref 6–20)
CO2: 25 mmol/L (ref 22–32)
Calcium: 8.8 mg/dL — ABNORMAL LOW (ref 8.9–10.3)
Chloride: 98 mmol/L (ref 98–111)
Creatinine, Ser: 1.35 mg/dL — ABNORMAL HIGH (ref 0.44–1.00)
GFR, Estimated: 50 mL/min — ABNORMAL LOW (ref >60)
Glucose, Bld: 322 mg/dL — ABNORMAL HIGH (ref 70–99)
Potassium: 3.7 mmol/L (ref 3.5–5.1)
Sodium: 133 mmol/L — ABNORMAL LOW (ref 135–145)
Total Bilirubin: 0.7 mg/dL (ref 0.3–1.2)
Total Protein: 7.1 g/dL (ref 6.5–8.1)

## 2020-11-03 LAB — URINALYSIS, ROUTINE W REFLEX MICROSCOPIC
Bacteria, UA: NONE SEEN
Bilirubin Urine: NEGATIVE
Glucose, UA: >=500 mg/dL — AB
Hgb urine dipstick: NEGATIVE
Ketones, ur: NEGATIVE mg/dL
Leukocytes,Ua: NEGATIVE
Nitrite: NEGATIVE
Protein, ur: NEGATIVE mg/dL
Specific Gravity, Urine: 1.029 (ref 1.005–1.030)
pH: 6 (ref 5.0–8.0)

## 2020-11-03 LAB — LIPASE, BLOOD: Lipase: 34 U/L (ref 11–51)

## 2020-11-03 MED ORDER — DICYCLOMINE HCL 10 MG PO CAPS
10.0000 mg | ORAL_CAPSULE | Freq: Once | ORAL | Status: AC
  Administered 2020-11-03: 10 mg via ORAL
  Filled 2020-11-03: qty 1

## 2020-11-03 MED ORDER — ONDANSETRON 4 MG PO TBDP
4.0000 mg | ORAL_TABLET | Freq: Three times a day (TID) | ORAL | 0 refills | Status: DC | PRN
  Filled 2020-11-03: qty 20, 7d supply, fill #0

## 2020-11-03 MED ORDER — POLYETHYLENE GLYCOL 3350 17 GM/SCOOP PO POWD
1.0000 | Freq: Once | ORAL | 0 refills | Status: AC
  Filled 2020-11-03: qty 238, 30d supply, fill #0

## 2020-11-03 MED ORDER — SODIUM CHLORIDE 0.9 % IV BOLUS
1000.0000 mL | Freq: Once | INTRAVENOUS | Status: AC
  Administered 2020-11-03: 1000 mL via INTRAVENOUS

## 2020-11-03 MED ORDER — ONDANSETRON HCL 4 MG/2ML IJ SOLN
4.0000 mg | Freq: Once | INTRAMUSCULAR | Status: AC
Start: 2020-11-03 — End: 2020-11-03
  Administered 2020-11-03: 4 mg via INTRAVENOUS
  Filled 2020-11-03: qty 2

## 2020-11-03 NOTE — Discharge Instructions (Addendum)
Please pick up MiraLAX, I have prescribed to you but if it is cheaper get over-the-counter that is totally appropriate.  I have attached instructions on how to use the medication, please read through them start utilizing it daily repair regular bowel prep.  If things change or worsen please come back to the ED as needed.  Please follow-up with your primary care doctor about your diabetes medicines I think that is contributing to the constipation.  You may also take Zofran as needed for nausea and vomiting.

## 2020-11-03 NOTE — ED Triage Notes (Signed)
Patient articulated left abdominal pain with emesis onset yesterday , denies fever or diarrhea .

## 2020-11-03 NOTE — ED Notes (Signed)
Patient given discharge instructions, all questions answered. Patient in possession of all belongings, directed to the discharge area  

## 2020-11-03 NOTE — ED Notes (Signed)
MD at bedside. 

## 2020-11-03 NOTE — ED Provider Notes (Signed)
[facility] EMERGENCY DEPARTMENT Provider Note   CSN: [id number] Arrival date & time: 11/03/20  0631     History Chief Complaint  Patient presents with   Abdominal Pain    Mariah Miller is a 45 y.o. female.   Abdominal Pain Associated symptoms: constipation, nausea and vomiting   Associated symptoms: no diarrhea, no dysuria, no fatigue, no fever, no hematuria and no vaginal discharge    Patient presents with vomiting x2 days.  Started acutely yesterday and was associated with nausea and left-sided abdominal pain.  Patient reports she has not had a bowel movement in 3 days, she also denies having passed any gas.  No prior abdominal surgeries, no dysuria, no hematuria, no history of kidney stones, no vaginal complaints.  She has not tried any alleviating factors, has not tried any aggravating factors.  States that she has had a history of constipation in the past requiring an enema to resolve.  Has not taken any over-the-counter laxatives in the last 3 days.  Patient also reports she has had a change in her diabetes medicine from [place] to [place] recently.  Past Medical History:  Diagnosis Date   Depression    Diabetes mellitus without complication ([place])    Hypertension    Migraine    Seizures ([place])    self reported - last seizure was Oct 2019 "absent seizure"    Patient Active Problem List   Diagnosis Date Noted   Migraine 10/01/2020   Seizures ([place]) 10/01/2020   Eczema 05/26/2017   Primary insomnia 09/28/2016   Anxiety and depression 05/26/2016   OSA (obstructive sleep apnea) 04/30/2015   Chest pain 04/28/2015   DM2 (diabetes mellitus, type 2) ([place]) 04/28/2015   ASCUS with positive high risk HPV cervical 06/06/2014   Anemia, iron deficiency 03/17/2014   Dyslipidemia 03/17/2014   Essential hypertension 03/17/2014   Uncontrolled diabetes mellitus ([place]) 06/17/2013   Hypoglycemia 04/08/2013   Diabetic neuropathy, painful ([place]) 03/21/2013    Other and unspecified hyperlipidemia 01/22/2013   A C DEGENERATION-CHRONIC 10/27/2008   SHOULDER PAIN 05/01/2008   IMPINGEMENT SYNDROME 05/01/2008    Past Surgical History:  Procedure Laterality Date   CHOLECYSTECTOMY     CYST EXCISION     SHOULDER OPEN ROTATOR CUFF REPAIR Left    TONSILLECTOMY     TUBAL LIGATION       OB History     Gravida  6   Para  3   Term  3   Preterm      AB  3   Living  3      SAB  2   IAB      Ectopic      Multiple      Live Births  3           Family History  Problem Relation Age of Onset   Hypertension Mother    Thyroid disease Mother    Irregular heart beat Mother    Bipolar disorder Sister    Colon cancer Other    Esophageal cancer Neg Hx    Rectal cancer Neg Hx    Stomach cancer Neg Hx     Social History   Tobacco Use   Smoking status: Former    Types: Cigarettes   Smokeless tobacco: Never   Tobacco comments:    Smoked for 4 years, quit age 8  Vaping Use   Vaping Use: Never used  Substance Use Topics   Alcohol use: Yes  Alcohol/week: 0.0 standard drinks    Comment: Holidays only   Drug use: No    Home Medications Prior to Admission medications   Medication Sig Start Date End Date Taking? Authorizing Provider  aspirin EC 81 MG tablet Take 1 tablet (81 mg total) by mouth daily. 01/22/13   Dhungel, Flonnie Overman, MD  atorvastatin (LIPITOR) 80 MG tablet Take 1 tablet (80 mg total) by mouth daily. 10/29/20   Charlott Rakes, MD  Blood Glucose Monitoring Suppl (TRUE METRIX METER) w/Device KIT USE AS DIRECTED Patient taking differently: 1 each by Other route as directed. 05/16/18   Charlott Rakes, MD  Clotrimazole 1 % OINT Apply 1 each topically 2 (two) times daily. Patient not taking: No sig reported 02/04/19   Charlott Rakes, MD  dapagliflozin propanediol (FARXIGA) 10 MG TABS tablet Take 1 tablet (10 mg total) by mouth daily before breakfast. 08/26/20   Argentina Donovan, PA-C  Dulaglutide (TRULICITY) 1.5  OQ/9.4TM SOPN Inject 1.5 mg into the skin once a week. 10/29/20   Charlott Rakes, MD  ferrous sulfate 325 (65 FE) MG tablet Take 1 tablet (325 mg total) by mouth 2 (two) times daily with a meal. 04/24/17   Charlott Rakes, MD  gabapentin (NEURONTIN) 300 MG capsule Take 2 capsules (600 mg total) by mouth 3 (three) times daily. 10/01/20   Charlott Rakes, MD  glucose blood (TRUE METRIX BLOOD GLUCOSE TEST) test strip Use as instructed Patient taking differently: 1 each by Other route as directed. 05/16/18   Charlott Rakes, MD  ibuprofen (ADVIL) 600 MG tablet Take 1 tablet (600 mg total) by mouth every 8 (eight) hours as needed. Patient not taking: No sig reported 08/21/20   Caccavale, Sophia, PA-C  insulin glargine-yfgn (SEMGLEE, YFGN,) 100 UNIT/ML Pen Inject 40 Units into the skin 2 (two) times daily. 10/28/20   Charlott Rakes, MD  insulin lispro (HUMALOG KWIKPEN) 100 UNIT/ML KwikPen Use 0-12 units per sliding scale three times daily with meals. 10/07/20   Charlott Rakes, MD  Insulin Pen Needle 32G X 4 MM MISC USE AS INSTRUCTED TO INJECT INSULIN AND VICTOZA DAILY. 04/21/20 04/21/21  Charlott Rakes, MD  Insulin Syringe-Needle U-100 31G X 5/16" 0.3 ML MISC USE TO INJECT INSULIN NOVOLOG TID. 04/21/20 04/21/21  Charlott Rakes, MD  lisinopril-hydrochlorothiazide (ZESTORETIC) 20-12.5 MG tablet TAKE 2 TABLETS BY MOUTH DAILY. 04/22/20 04/22/21  Charlott Rakes, MD  metFORMIN (GLUCOPHAGE) 1000 MG tablet Take 1 tablet (1,000 mg total) by mouth 2 (two) times daily with a meal. 10/01/20   Charlott Rakes, MD  metoprolol tartrate (LOPRESSOR) 50 MG tablet TAKE 1 TABLET (50 MG TOTAL) BY MOUTH 2 (TWO) TIMES DAILY. Patient taking differently: Take 50 mg by mouth 2 (two) times daily. 04/22/20 04/22/21  Charlott Rakes, MD  metroNIDAZOLE (METROGEL) 0.75 % vaginal gel PLACE 1 APPLICATORFUL VAGINALLY AT BEDTIME. Patient not taking: No sig reported 04/23/20 04/23/21  Charlott Rakes, MD  predniSONE (DELTASONE) 20 MG tablet Take 1  tablet (20 mg total) by mouth daily with breakfast. 10/28/20   Charlott Rakes, MD  promethazine (PHENERGAN) 25 MG tablet Take 1 tablet (25 mg total) by mouth every 6 (six) hours as needed for nausea or vomiting. Patient not taking: Reported on 10/28/2020 06/27/19   Dalia Heading, PA-C  sucralfate (CARAFATE) 1 g tablet Take 1 tablet (1 g total) by mouth 4 (four) times daily -  with meals and at bedtime. Patient not taking: No sig reported 06/18/18   Muthersbaugh, Jarrett Soho, PA-C  traMADol (ULTRAM) 50 MG tablet Take 1  tablet (50 mg total) by mouth every 12 (twelve) hours as needed for up to 5 days. 10/28/20 11/08/20  Charlott Rakes, MD  traZODone (DESYREL) 100 MG tablet Take 1 tablet (100 mg total) by mouth at bedtime as needed for sleep. 08/20/19   Charlott Rakes, MD  TRUEplus Lancets 28G MISC USE AS INSTRUCTED Patient taking differently: 1 each by Other route as directed. 05/16/18   Charlott Rakes, MD    Allergies    Tomato  Review of Systems   Review of Systems  Constitutional:  Negative for fatigue and fever.  Gastrointestinal:  Positive for abdominal pain, constipation, nausea and vomiting. Negative for diarrhea.  Genitourinary:  Negative for dysuria, hematuria and vaginal discharge.  Musculoskeletal:  Negative for back pain.   Physical Exam Updated Vital Signs BP 135/80 (BP Location: Left Arm)   Pulse 86   Temp 98.2 F (36.8 C) (Oral)   Resp 18   Ht $R'5\' 5"'Dg$  (1.651 m)   Wt 106 kg   LMP 10/06/2020   SpO2 99%   BMI 38.89 kg/m   Physical Exam Vitals and nursing note reviewed. Exam conducted with a chaperone present.  Constitutional:      General: She is not in acute distress.    Appearance: Normal appearance.  HENT:     Head: Normocephalic and atraumatic.  Eyes:     General: No scleral icterus.    Extraocular Movements: Extraocular movements intact.     Pupils: Pupils are equal, round, and reactive to light.  Cardiovascular:     Rate and Rhythm: Normal rate and regular  rhythm.  Pulmonary:     Effort: Pulmonary effort is normal.     Breath sounds: Normal breath sounds.  Abdominal:     General: Abdomen is flat. There is no distension.     Palpations: Abdomen is soft.     Tenderness: There is abdominal tenderness in the left upper quadrant. There is no right CVA tenderness, left CVA tenderness, guarding or rebound.  Skin:    Coloration: Skin is not jaundiced.  Neurological:     Mental Status: She is alert. Mental status is at baseline.     Coordination: Coordination normal.    ED Results / Procedures / Treatments   Labs (all labs ordered are listed, but only abnormal results are displayed) Labs Reviewed  COMPREHENSIVE METABOLIC PANEL - Abnormal; Notable for the following components:      Result Value   Sodium 133 (*)    Glucose, Bld 322 (*)    BUN 23 (*)    Creatinine, Ser 1.35 (*)    Calcium 8.8 (*)    GFR, Estimated 50 (*)    All other components within normal limits  CBC - Abnormal; Notable for the following components:   Platelets 417 (*)    All other components within normal limits  URINALYSIS, ROUTINE W REFLEX MICROSCOPIC - Abnormal; Notable for the following components:   Color, Urine STRAW (*)    Glucose, UA >=500 (*)    All other components within normal limits  LIPASE, BLOOD  I-STAT BETA HCG BLOOD, ED ([facility], [facility], [facility] ONLY)    EKG None  Radiology No results found.  Procedures Procedures   Medications Ordered in ED Medications  sodium chloride 0.9 % bolus 1,000 mL (has no administration in time range)  ondansetron (ZOFRAN) injection 4 mg (has no administration in time range)  dicyclomine (BENTYL) capsule 10 mg (has no administration in time range)    ED Course  I have reviewed the triage vital signs and the nursing notes.  Pertinent labs & imaging results that were available during my care of the patient were reviewed by me and considered in my medical decision making (see chart for details).  Clinical Course as of  11/03/20 1732  Tue Nov 03, 2020  1459 I-Stat beta hCG blood, ED Not an ectopic pregnancy [HS]  1554 Lipase, blood Not consistent with pancreatitis, additionally no epigastric pain with radiation to the back. [HS]  5056 CBC(!) No leukocytosis concerning for infectious or inflammatory etiology intra-abdominally, no anemia [HS]  1554 Urinalysis, Routine w reflex microscopic Urine, Clean Catch(!) Glucosuria consistent with her diabetes, no evidence of anion gap, bicarb dysfunction, potassium derangement.  Not consistent with DKA.  Additionally no signs of UTI or red blood cells concerning for kidney stone.  Patient also do not have any CVA tenderness making kidney stone less likely.  Do not think CT abdomen is warranted. [HS]  1556 Comprehensive metabolic panel(!) Patient has an AKI likely due to fluid loss and dehydration.  We will replenish fluid loss with IVF.  No gross electrolyte derangement.  [HS]  20 DG Abd 2 Views No evidence of a small bowel obstruction, patient does have a moderate colonic stool burden. [HS]    Clinical Course User Index [HS] Sherrill Raring, PA-C   MDM Rules/Calculators/A&P                           Patient vitals are stable, she is in no acute distress.  She is reporting with constipation rest and her nausea and vomiting without trying any alleviating factors.  We will check abdominal labs and radiograph of abdomen to assess for stool burden.  SBO was on the differential, but made less likely given that she has no prior history of abdominal surgeries, abdomen is soft, bowel sounds are present.  Patient lab work is notable for AKI, suspect this is likely due to dehydration from fluid loss over the last day with all the bouts of emesis.  Will give 1 L of fluid.  Patient also given Zofran and Bentyl.  Please see ED course for termination of labs and imaging as they pertain to the MDM.  Patient resting comfortably, nausea has resolved.  Patient passed p.o. challenge.   We will give her bowel regiment and have her follow-up outpatient with her primary care doctor.  No signs of small bowel obstruction, I suspect that the constipation is secondary due to changes in her diabetes medication causing slow GI transit.  At this time the patient is appropriate for discharge with follow-up.  Final Clinical Impression(s) / ED Diagnoses Final diagnoses:  Constipation    Rx / DC Orders ED Discharge Orders     None        Sherrill Raring, PA-C 11/03/20 1732    Blanchie Dessert, MD 11/04/20 1659

## 2020-11-04 ENCOUNTER — Other Ambulatory Visit: Payer: Self-pay

## 2020-11-11 ENCOUNTER — Other Ambulatory Visit: Payer: Self-pay

## 2020-11-24 ENCOUNTER — Other Ambulatory Visit: Payer: Self-pay

## 2020-11-25 ENCOUNTER — Other Ambulatory Visit: Payer: Self-pay

## 2020-11-27 ENCOUNTER — Encounter (HOSPITAL_COMMUNITY): Payer: Self-pay

## 2020-11-27 ENCOUNTER — Other Ambulatory Visit: Payer: Self-pay

## 2020-11-27 ENCOUNTER — Emergency Department (HOSPITAL_COMMUNITY): Payer: Managed Care, Other (non HMO)

## 2020-11-27 ENCOUNTER — Emergency Department (HOSPITAL_COMMUNITY)
Admission: EM | Admit: 2020-11-27 | Discharge: 2020-11-27 | Disposition: A | Discharge status: Home / Self Care | Payer: Managed Care, Other (non HMO) | Attending: Emergency Medicine | Admitting: Emergency Medicine

## 2020-11-27 DIAGNOSIS — E1140 Type 2 diabetes mellitus with diabetic neuropathy, unspecified: Secondary | ICD-10-CM | POA: Diagnosis not present

## 2020-11-27 DIAGNOSIS — Z7984 Long term (current) use of oral hypoglycemic drugs: Secondary | ICD-10-CM | POA: Diagnosis not present

## 2020-11-27 DIAGNOSIS — I10 Essential (primary) hypertension: Secondary | ICD-10-CM | POA: Insufficient documentation

## 2020-11-27 DIAGNOSIS — M25562 Pain in left knee: Secondary | ICD-10-CM | POA: Diagnosis not present

## 2020-11-27 DIAGNOSIS — Z794 Long term (current) use of insulin: Secondary | ICD-10-CM | POA: Diagnosis not present

## 2020-11-27 DIAGNOSIS — Z79899 Other long term (current) drug therapy: Secondary | ICD-10-CM | POA: Insufficient documentation

## 2020-11-27 DIAGNOSIS — Z7982 Long term (current) use of aspirin: Secondary | ICD-10-CM | POA: Insufficient documentation

## 2020-11-27 DIAGNOSIS — Z87891 Personal history of nicotine dependence: Secondary | ICD-10-CM | POA: Insufficient documentation

## 2020-11-27 MED ORDER — ETODOLAC 300 MG PO CAPS
300.0000 mg | ORAL_CAPSULE | Freq: Three times a day (TID) | ORAL | 0 refills | Status: DC
  Filled 2020-11-27: qty 21, 7d supply, fill #0

## 2020-11-27 NOTE — ED Provider Notes (Signed)
[facility] DEPT Provider Note   CSN: [id number] Arrival date & time: 11/27/20  1032     History Chief Complaint  Patient presents with   Knee Pain    Mariah Miller is a 45 y.o. female.   Knee Pain  Patient presents to the ED for evaluation of knee pain.  Patient states she has been having some symptoms for the last 3 weeks.  Her knee is sore and it gets worse when she tries to move around.  She saw her primary doctor and was prescribed an anti-inflammatory medication.  Feels like the swelling has gotten better but she does not feel like the pain is any better.  The other day she felt like there was an area that was swollen on the left side.  She denies any fevers or chills.  She is not having any swelling in her calf.  Past Medical History:  Diagnosis Date   Depression    Diabetes mellitus without complication ([place])    Hypertension    Migraine    Seizures ([place])    self reported - last seizure was Oct 2019 "absent seizure"    Patient Active Problem List   Diagnosis Date Noted   Migraine 10/01/2020   Seizures ([place]) 10/01/2020   Eczema 05/26/2017   Primary insomnia 09/28/2016   Anxiety and depression 05/26/2016   OSA (obstructive sleep apnea) 04/30/2015   Chest pain 04/28/2015   DM2 (diabetes mellitus, type 2) ([place]) 04/28/2015   ASCUS with positive high risk HPV cervical 06/06/2014   Anemia, iron deficiency 03/17/2014   Dyslipidemia 03/17/2014   Essential hypertension 03/17/2014   Uncontrolled diabetes mellitus ([place]) 06/17/2013   Hypoglycemia 04/08/2013   Diabetic neuropathy, painful ([place]) 03/21/2013   Other and unspecified hyperlipidemia 01/22/2013   A C DEGENERATION-CHRONIC 10/27/2008   SHOULDER PAIN 05/01/2008   IMPINGEMENT SYNDROME 05/01/2008    Past Surgical History:  Procedure Laterality Date   CHOLECYSTECTOMY     CYST EXCISION     SHOULDER OPEN ROTATOR CUFF REPAIR Left    TONSILLECTOMY     TUBAL LIGATION       OB  History     Gravida  6   Para  3   Term  3   Preterm      AB  3   Living  3      SAB  2   IAB      Ectopic      Multiple      Live Births  3           Family History  Problem Relation Age of Onset   Hypertension Mother    Thyroid disease Mother    Irregular heart beat Mother    Bipolar disorder Sister    Colon cancer Other    Esophageal cancer Neg Hx    Rectal cancer Neg Hx    Stomach cancer Neg Hx     Social History   Tobacco Use   Smoking status: Former    Types: Cigarettes   Smokeless tobacco: Never   Tobacco comments:    Smoked for 4 years, quit age 80  Vaping Use   Vaping Use: Never used  Substance Use Topics   Alcohol use: Yes   Drug use: No    Home Medications Prior to Admission medications   Medication Sig Start Date End Date Taking? Authorizing Provider  etodolac (LODINE) 300 MG capsule Take 1 capsule (300 mg total) by mouth every 8 (  eight) hours. 11/27/20  Yes Dorie Rank, MD  aspirin EC 81 MG tablet Take 1 tablet (81 mg total) by mouth daily. 01/22/13   Dhungel, Flonnie Overman, MD  atorvastatin (LIPITOR) 80 MG tablet Take 1 tablet (80 mg total) by mouth daily. 10/29/20   Charlott Rakes, MD  Blood Glucose Monitoring Suppl (TRUE METRIX METER) w/Device KIT USE AS DIRECTED Patient taking differently: 1 each by Other route as directed. 05/16/18   Charlott Rakes, MD  Clotrimazole 1 % OINT Apply 1 each topically 2 (two) times daily. Patient not taking: No sig reported 02/04/19   Charlott Rakes, MD  dapagliflozin propanediol (FARXIGA) 10 MG TABS tablet Take 1 tablet (10 mg total) by mouth daily before breakfast. 08/26/20   Argentina Donovan, PA-C  Dulaglutide (TRULICITY) 1.5 CB/4.4HQ SOPN Inject 1.5 mg into the skin once a week. 10/29/20   Charlott Rakes, MD  ferrous sulfate 325 (65 FE) MG tablet Take 1 tablet (325 mg total) by mouth 2 (two) times daily with a meal. 04/24/17   Charlott Rakes, MD  gabapentin (NEURONTIN) 300 MG capsule Take 2 capsules  (600 mg total) by mouth 3 (three) times daily. 10/01/20   Charlott Rakes, MD  glucose blood (TRUE METRIX BLOOD GLUCOSE TEST) test strip Use as instructed Patient taking differently: 1 each by Other route as directed. 05/16/18   Charlott Rakes, MD  ibuprofen (ADVIL) 600 MG tablet Take 1 tablet (600 mg total) by mouth every 8 (eight) hours as needed. Patient not taking: No sig reported 08/21/20   Caccavale, Sophia, PA-C  insulin glargine-yfgn (SEMGLEE, YFGN,) 100 UNIT/ML Pen Inject 40 Units into the skin 2 (two) times daily. 10/28/20   Charlott Rakes, MD  insulin lispro (HUMALOG KWIKPEN) 100 UNIT/ML KwikPen Use 0-12 units per sliding scale three times daily with meals. 10/07/20   Charlott Rakes, MD  Insulin Pen Needle 32G X 4 MM MISC USE AS INSTRUCTED TO INJECT INSULIN AND VICTOZA DAILY. 04/21/20 04/21/21  Charlott Rakes, MD  Insulin Syringe-Needle U-100 31G X 5/16" 0.3 ML MISC USE TO INJECT INSULIN NOVOLOG TID. 04/21/20 04/21/21  Charlott Rakes, MD  lisinopril-hydrochlorothiazide (ZESTORETIC) 20-12.5 MG tablet TAKE 2 TABLETS BY MOUTH DAILY. 04/22/20 04/22/21  Charlott Rakes, MD  metFORMIN (GLUCOPHAGE) 1000 MG tablet Take 1 tablet (1,000 mg total) by mouth 2 (two) times daily with a meal. 10/01/20   Charlott Rakes, MD  metoprolol tartrate (LOPRESSOR) 50 MG tablet TAKE 1 TABLET (50 MG TOTAL) BY MOUTH 2 (TWO) TIMES DAILY. Patient taking differently: Take 50 mg by mouth 2 (two) times daily. 04/22/20 04/22/21  Charlott Rakes, MD  metroNIDAZOLE (METROGEL) 0.75 % vaginal gel PLACE 1 APPLICATORFUL VAGINALLY AT BEDTIME. Patient not taking: No sig reported 04/23/20 04/23/21  Charlott Rakes, MD  ondansetron (ZOFRAN ODT) 4 MG disintegrating tablet Take 1 tablet (4 mg total) by mouth every 8 (eight) hours as needed for nausea or vomiting. 11/03/20   Sherrill Raring, PA-C  predniSONE (DELTASONE) 20 MG tablet Take 1 tablet (20 mg total) by mouth daily with breakfast. 10/28/20   Charlott Rakes, MD  promethazine (PHENERGAN) 25  MG tablet Take 1 tablet (25 mg total) by mouth every 6 (six) hours as needed for nausea or vomiting. Patient not taking: Reported on 10/28/2020 06/27/19   Dalia Heading, PA-C  sucralfate (CARAFATE) 1 g tablet Take 1 tablet (1 g total) by mouth 4 (four) times daily -  with meals and at bedtime. Patient not taking: No sig reported 06/18/18   Muthersbaugh, Jarrett Soho, PA-C  traZODone (DESYREL)  100 MG tablet Take 1 tablet (100 mg total) by mouth at bedtime as needed for sleep. 08/20/19   Charlott Rakes, MD  TRUEplus Lancets 28G MISC USE AS INSTRUCTED Patient taking differently: 1 each by Other route as directed. 05/16/18   Charlott Rakes, MD    Allergies    Tomato  Review of Systems   Review of Systems  All other systems reviewed and are negative.  Physical Exam Updated Vital Signs BP 114/72   Pulse 71   Temp 97.9 F (36.6 C) (Oral)   Resp 15   Ht 1.651 m ($Remove'5\' 5"'HKGjlGX$ )   Wt 96.6 kg   LMP 11/25/2020   SpO2 100%   BMI 35.45 kg/m   Physical Exam Vitals and nursing note reviewed.  Constitutional:      General: She is not in acute distress.    Appearance: She is well-developed.  HENT:     Head: Normocephalic and atraumatic.     Right Ear: External ear normal.     Left Ear: External ear normal.  Eyes:     General: No scleral icterus.       Right eye: No discharge.        Left eye: No discharge.     Conjunctiva/sclera: Conjunctivae normal.  Neck:     Trachea: No tracheal deviation.  Cardiovascular:     Rate and Rhythm: Normal rate.  Pulmonary:     Effort: Pulmonary effort is normal. No respiratory distress.     Breath sounds: No stridor.  Abdominal:     General: There is no distension.  Musculoskeletal:        General: No swelling or deformity.     Cervical back: Neck supple.     Comments: No tenderness to palpation of the calf, foot is warm and well-perfused, mild tenderness palpation in the lateral aspect of the knee, full range of motion, no laxity, no effusion noted   Skin:    General: Skin is warm and dry.     Findings: No rash.  Neurological:     Mental Status: She is alert.     Cranial Nerves: Cranial nerve deficit: no gross deficits.    ED Results / Procedures / Treatments   Labs (all labs ordered are listed, but only abnormal results are displayed) Labs Reviewed - No data to display  EKG None  Radiology DG Knee Complete 4 Views Left  Result Date: 11/27/2020 CLINICAL DATA:  LEFT knee pain and swelling in a 45 year old female, 3 weeks duration. EXAM: LEFT KNEE - COMPLETE 4+ VIEW COMPARISON:  None FINDINGS: No acute fracture or dislocation.  No destructive bony process. Possible small suprapatellar effusion. Mild soft tissue swelling suggested over the distal anterior thigh. Mild patellofemoral degenerative changes. IMPRESSION: No acute bony abnormality. Possible small suprapatellar effusion. Mild soft tissue swelling suggested over the distal anterior thigh correlate with any signs of cellulitis or soft tissue swelling on physical exam. Electronically Signed   By: Zetta Bills M.D.   On: 11/27/2020 12:07    Procedures Procedures   Medications Ordered in ED Medications - No data to display  ED Course  I have reviewed the triage vital signs and the nursing notes.  Pertinent labs & imaging results that were available during my care of the patient were reviewed by me and considered in my medical decision making (see chart for details).    MDM Rules/Calculators/A&P  Patient presented to ED with complaints of knee pain.  Physical exam is reassuring.  She does not have a large effusion on exam.  There is no findings to suggest DVT.  No findings to suggest acute vascular compromise.  No large effusion and patient has good range of motion.  X-ray suggests the possibility of soft tissue swelling but clinically she does not have any evidence of cellulitis.  Possible she could have mild bursitis.  We will try a course of  NSAIDs.  Recommend outpatient follow-up with orthopedics. Final Clinical Impression(s) / ED Diagnoses Final diagnoses:  Acute pain of left knee    Rx / DC Orders ED Discharge Orders          Ordered    etodolac (LODINE) 300 MG capsule  Every 8 hours       Note to Pharmacy: As needed for pain   11/27/20 1259             Dorie Rank, MD 11/27/20 1301

## 2020-11-27 NOTE — ED Notes (Signed)
Ortho paged to provide ortho device application. Stated they will be on the way shortly.

## 2020-11-27 NOTE — Progress Notes (Signed)
Orthopedic Tech Progress Note Patient Details:  Mariah Miller 30-Apr-1975 [id number]  Ortho Devices Type of Ortho Device: Knee Sleeve Ortho Device/Splint Location: knee support Ortho Device/Splint Interventions: Application   Post Interventions Patient Tolerated: Well Instructions Provided: Care of device  Saul Fordyce 11/27/2020, 1:09 PM

## 2020-11-27 NOTE — ED Triage Notes (Signed)
Patient c/o left knee pain x 3 weeks. Patient states that she went to her [facility]p at that time was given an antiinflammatory. Today, the pain is worse, but swelling some better.

## 2020-11-27 NOTE — Discharge Instructions (Addendum)
Take the medications as prescribed to help with the pain and discomfort.  Follow-up with an orthopedic doctor for further evaluation of your knee pain.  Use the knee sleeve for comfort.  You can also try applying over-the-counter lidocaine patches for pain such as Salonpas

## 2020-11-30 ENCOUNTER — Other Ambulatory Visit: Payer: Self-pay

## 2020-11-30 ENCOUNTER — Ambulatory Visit: Payer: Managed Care, Other (non HMO) | Admitting: Pharmacist

## 2020-11-30 ENCOUNTER — Telehealth: Payer: Self-pay | Admitting: Family Medicine

## 2020-11-30 DIAGNOSIS — G8929 Other chronic pain: Secondary | ICD-10-CM

## 2020-11-30 DIAGNOSIS — M25562 Pain in left knee: Secondary | ICD-10-CM

## 2020-11-30 NOTE — Telephone Encounter (Signed)
Copied from CRM [id number]. Topic: General - Other >> Nov 24, 2020 11:37 AM Gaetana Michaelis A wrote: Reason for CRM: Patient has called to share that their prescription for Rx #: [id number] predniSONE (DELTASONE) 20 MG tablet [[id number]] is not effective and they would like to discuss the continued swelling and discomfort in their left knee when possible  The patient shares that they've noticed the swelling has returned for more than a week with noticeable swelling on the left side of their left knee  Please contact further when possible

## 2020-12-01 ENCOUNTER — Other Ambulatory Visit: Payer: Self-pay

## 2020-12-01 NOTE — Telephone Encounter (Signed)
At her last visit I advised her I would order a knee xray if it persisted. I have placed that order and will be in touch with her once I receive the report.

## 2020-12-01 NOTE — Telephone Encounter (Signed)
Does patient need an office/virtual to discuss.

## 2020-12-02 ENCOUNTER — Ambulatory Visit: Payer: Managed Care, Other (non HMO) | Admitting: Pharmacist

## 2020-12-02 NOTE — Telephone Encounter (Signed)
Pt was called and informed of Xray order being placed.

## 2020-12-03 ENCOUNTER — Ambulatory Visit: Payer: Managed Care, Other (non HMO) | Admitting: Cardiology

## 2020-12-14 ENCOUNTER — Encounter: Payer: Self-pay | Admitting: Cardiovascular Disease

## 2020-12-14 ENCOUNTER — Other Ambulatory Visit: Payer: Self-pay

## 2020-12-14 ENCOUNTER — Ambulatory Visit (INDEPENDENT_AMBULATORY_CARE_PROVIDER_SITE_OTHER): Payer: Managed Care, Other (non HMO) | Admitting: Cardiovascular Disease

## 2020-12-14 DIAGNOSIS — R0789 Other chest pain: Secondary | ICD-10-CM

## 2020-12-14 DIAGNOSIS — I10 Essential (primary) hypertension: Secondary | ICD-10-CM

## 2020-12-14 DIAGNOSIS — E6609 Other obesity due to excess calories: Secondary | ICD-10-CM

## 2020-12-14 DIAGNOSIS — Z6834 Body mass index (BMI) 34.0-34.9, adult: Secondary | ICD-10-CM

## 2020-12-14 DIAGNOSIS — R0683 Snoring: Secondary | ICD-10-CM | POA: Diagnosis not present

## 2020-12-14 DIAGNOSIS — E785 Hyperlipidemia, unspecified: Secondary | ICD-10-CM

## 2020-12-14 DIAGNOSIS — M25562 Pain in left knee: Secondary | ICD-10-CM | POA: Insufficient documentation

## 2020-12-14 MED ORDER — METOPROLOL SUCCINATE ER 100 MG PO TB24
100.0000 mg | ORAL_TABLET | Freq: Every day | ORAL | 3 refills | Status: DC
  Filled 2020-12-14: qty 30, 30d supply, fill #0
  Filled 2021-02-07: qty 30, 30d supply, fill #1
  Filled 2021-04-26: qty 30, 30d supply, fill #0
  Filled 2021-04-26: qty 30, 30d supply, fill #2
  Filled 2021-04-27: qty 30, 30d supply, fill #0
  Filled 2021-08-19: qty 30, 30d supply, fill #1

## 2020-12-14 NOTE — Progress Notes (Signed)
Cardiology Office Note    Date:  12/22/2020   ID:  Juanell, Saffo 1975/04/25, MRN [id number]  PCP:  Charlott Rakes, MD  Cardiologist:  Shelva Majestic, MD   New evaluation   History of Present Illness:  Mariah Miller is a 45 y.o. female who has a history of hypertension, diabetes mellitus, obesity, seizures, and possible sleep apnea.  She was recently seen in the emergency room in July 2022 with sharp left-sided chest pain that woke her up from sleep.  She has had increased stress and anxiety and apparently had a follow-up ER evaluation on October 12, 2020.  Her chest pain was felt most likely nonischemic.  She denied any exertional precipitation of her discomfort.  She was advised to have cardiology follow-up.  In 2017, she had undergone a diagnostic PSG referred by Dr.Jegede.  The study was done on September 01, 2015 and was interpreted by Dr. Keturah Barre.  On that study, there was no significant sleep apnea identified with an AHI of 0.2.  AHI during REM sleep was 0.6.  There was no significant oxygen desaturation.  Moderate snoring was noted.  Presently, she typically works 12-hour shifts from either 7 AM to 7 PM or 7 PM to 7 AM.  At times she has noticed rare sharp stabbing-like chest pain which is nonexertional.  At times she does note her heart beating hard at night.  She has been on a medical regimen consisting of atorvastatin 80 mg for hyperlipidemia, lisinopril HCT 40/25 mg daily and metoprolol tartrate 50 mg twice a day for hypertension.  She is diabetic on Farxiga, Humalog insulin in addition to metformin as well as Trulicity.  She had been on simvastatin 40 mg but this had recently been changed to atorvastatin 80 mg with laboratory showing an LDL cholesterol at 112 in August 2022.  She presents for cardiology evaluation.  Past Medical History:  Diagnosis Date   Depression    Diabetes mellitus without complication ([place])    Hypertension    Migraine    Seizures ([place])    self  reported - last seizure was Oct 2019 "absent seizure"    Past Surgical History:  Procedure Laterality Date   CHOLECYSTECTOMY     CYST EXCISION     SHOULDER OPEN ROTATOR CUFF REPAIR Left    TONSILLECTOMY     TUBAL LIGATION      Current Medications: Outpatient Medications Prior to Visit  Medication Sig Dispense Refill   aspirin EC 81 MG tablet Take 1 tablet (81 mg total) by mouth daily. 30 tablet 5   atorvastatin (LIPITOR) 80 MG tablet Take 1 tablet (80 mg total) by mouth daily. 30 tablet 6   Blood Glucose Monitoring Suppl (TRUE METRIX METER) w/Device KIT USE AS DIRECTED (Patient taking differently: 1 each by Other route as directed.) 1 kit 0   Clotrimazole 1 % OINT Apply 1 each topically 2 (two) times daily. 56.7 g 0   dapagliflozin propanediol (FARXIGA) 10 MG TABS tablet Take 1 tablet (10 mg total) by mouth daily before breakfast. 30 tablet 6   Dulaglutide (TRULICITY) 1.5 NO/0.3BC SOPN Inject 1.5 mg into the skin once a week. 2 mL 6   etodolac (LODINE) 300 MG capsule Take 1 capsule (300 mg total) by mouth every 8 (eight) hours. 21 capsule 0   ferrous sulfate 325 (65 FE) MG tablet Take 1 tablet (325 mg total) by mouth 2 (two) times daily with a meal. 180 tablet 3  gabapentin (NEURONTIN) 300 MG capsule Take 2 capsules (600 mg total) by mouth 3 (three) times daily. 120 capsule 6   glucose blood (TRUE METRIX BLOOD GLUCOSE TEST) test strip Use as instructed (Patient taking differently: 1 each by Other route as directed.) 100 each 12   ibuprofen (ADVIL) 600 MG tablet Take 1 tablet (600 mg total) by mouth every 8 (eight) hours as needed. 10 tablet 0   insulin glargine-yfgn (SEMGLEE, YFGN,) 100 UNIT/ML Pen Inject 40 Units into the skin 2 (two) times daily. 30 mL 3   insulin lispro (HUMALOG KWIKPEN) 100 UNIT/ML KwikPen Use 0-12 units per sliding scale three times daily with meals. 15 mL 2   Insulin Pen Needle 32G X 4 MM MISC USE AS INSTRUCTED TO INJECT INSULIN AND VICTOZA DAILY. 100 each 0    Insulin Syringe-Needle U-100 31G X 5/16" 0.3 ML MISC USE TO INJECT INSULIN NOVOLOG TID. 100 each 0   lisinopril-hydrochlorothiazide (ZESTORETIC) 20-12.5 MG tablet TAKE 2 TABLETS BY MOUTH DAILY. 180 tablet 1   metFORMIN (GLUCOPHAGE) 1000 MG tablet Take 1 tablet (1,000 mg total) by mouth 2 (two) times daily with a meal. 180 tablet 1   metroNIDAZOLE (METROGEL) 0.75 % vaginal gel PLACE 1 APPLICATORFUL VAGINALLY AT BEDTIME. 70 g 0   ondansetron (ZOFRAN ODT) 4 MG disintegrating tablet Take 1 tablet (4 mg total) by mouth every 8 (eight) hours as needed for nausea or vomiting. 20 tablet 0   predniSONE (DELTASONE) 20 MG tablet Take 1 tablet (20 mg total) by mouth daily with breakfast. 5 tablet 0   promethazine (PHENERGAN) 25 MG tablet Take 1 tablet (25 mg total) by mouth every 6 (six) hours as needed for nausea or vomiting. 10 tablet 0   sucralfate (CARAFATE) 1 g tablet Take 1 tablet (1 g total) by mouth 4 (four) times daily -  with meals and at bedtime. 60 tablet 0   traZODone (DESYREL) 100 MG tablet Take 1 tablet (100 mg total) by mouth at bedtime as needed for sleep. 30 tablet 3   TRUEplus Lancets 28G MISC USE AS INSTRUCTED (Patient taking differently: 1 each by Other route as directed.) 100 each 5   metoprolol tartrate (LOPRESSOR) 50 MG tablet TAKE 1 TABLET (50 MG TOTAL) BY MOUTH 2 (TWO) TIMES DAILY. (Patient taking differently: Take 50 mg by mouth 2 (two) times daily.) 60 tablet 6   No facility-administered medications prior to visit.     Allergies:   Tomato   Social History   Socioeconomic History   Marital status: Divorced    Spouse name: Not on file   Number of children: Not on file   Years of education: Not on file   Highest education level: Not on file  Occupational History    Comment: Assembler   Tobacco Use   Smoking status: Former    Types: Cigarettes   Smokeless tobacco: Never   Tobacco comments:    Smoked for 4 years, quit age 30  Vaping Use   Vaping Use: Never used   Substance and Sexual Activity   Alcohol use: Yes   Drug use: No   Sexual activity: Yes    Birth control/protection: None    Comment: Starteds menstral cycle today 12-12-2018 pt reported  Other Topics Concern   Not on file  Social History Narrative   Not on file   Social Determinants of Health   Financial Resource Strain: Not on file  Food Insecurity: Not on file  Transportation Needs: Not on file  Physical Activity: Not on file  Stress: Not on file  Social Connections: Not on file    Socially she is divorced.  She has 3 children.  She lives by herself.  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  She completed high school.  She smoked for 2 years and quit at age 69.  Family History:  The patient's family history includes Bipolar disorder in her sister; Colon cancer in an other family member; Hypertension in her mother; Irregular heart beat in her mother; Thyroid disease in her mother.  Her mother is living at age 29 but has heart problems thyroid disease and breathing difficulty.  She does not know about her father.  She has a brother age 28 and a sister age 62 who has diabetes.  She has 3 children, a girl age 33 who has seizures and bipolar disorder, a daughter age 56 with anxiety, and a boy age 109.  ROS General: Negative; No fevers, chills, or night sweats;  HEENT: Negative; No changes in vision or hearing, sinus congestion, difficulty swallowing Pulmonary: Negative; No cough, wheezing, shortness of breath, hemoptysis Cardiovascular: Negative; No chest pain, presyncope, syncope, palpitations GI: Negative; No nausea, vomiting, diarrhea, or abdominal pain GU: Negative; No dysuria, hematuria, or difficulty voiding Musculoskeletal: Negative; no myalgias, joint pain, or weakness Hematologic/Oncology: Negative; no easy bruising, bleeding Endocrine: Negative; no heat/cold intolerance; no diabetes Neuro: Negative; no changes in balance, headaches Skin: Negative; No rashes or skin  lesions Psychiatric: Negative; No behavioral problems, depression Sleep: Negative; No snoring, daytime sleepiness, hypersomnolence, bruxism, restless legs, hypnogognic hallucinations, no cataplexy Other comprehensive 14 point system review is negative.   PHYSICAL EXAM:   VS:  BP 140/78   Pulse 85   Ht _0  (1.651 m)   Wt 209 lb 12.8 oz (95.2 kg)   LMP 11/25/2020   SpO2 94%   BMI 34.91 kg/m     Repeat blood pressure by me was 138/74 supine 134/74 standing.  Wt Readings from Last 3 Encounters:  12/14/20 209 lb 12.8 oz (95.2 kg)  11/27/20 213 lb (96.6 kg)  11/03/20 233 lb 11 oz (106 kg)    General: Alert, oriented, no distress.  Skin: normal turgor, no rashes, warm and dry HEENT: Normocephalic, atraumatic. Pupils equal round and reactive to light; sclera anicteric; extraocular muscles intact;  Nose without nasal septal hypertrophy Mouth/Parynx benign; Mallinpatti scale 3/4 Neck: No JVD, no carotid bruits; normal carotid upstroke Lungs: clear to ausculatation and percussion; no wheezing or rales Chest wall: without tenderness to palpitation; no costochondral tenderness Heart: PMI not displaced, RRR in the 80s; s1 s2 normal, 1/6 systolic murmur, no diastolic murmur, no rubs, gallops, thrills, or heaves Abdomen: soft, nontender; no hepatosplenomehaly, BS+; abdominal aorta nontender and not dilated by palpation. Back: no CVA tenderness Pulses 2+ Musculoskeletal: full range of motion, normal strength, no joint deformities Extremities: no clubbing cyanosis or edema, Homan's sign negative  Neurologic: grossly nonfocal; Cranial nerves grossly wnl Psychologic: Normal mood and affect   Studies/Labs Reviewed:   EKG:  EKG is ordered today.  ECG (independently read by me):  NSR at 85; nonspecific T change III  Recent Labs: BMP Latest Ref Rng & Units 11/03/2020 10/28/2020 10/12/2020  Glucose 70 - 99 mg/dL 322(H) 120(H) 196(H)  BUN 6 - 20 mg/dL 23(H) 13 19  Creatinine 0.44 - 1.00 mg/dL  1.35(H) 0.92 1.16(H)  BUN/Creat Ratio 9 - 23 - 14 -  Sodium 135 - 145 mmol/L 133(L) 138 133(L)  Potassium 3.5 -  5.1 mmol/L 3.7 4.8 4.1  Chloride 98 - 111 mmol/L 98 102 101  CO2 22 - 32 mmol/L _0 Calcium 8.9 - 10.3 mg/dL 8.8(L) 9.2 8.9     Hepatic Function Latest Ref Rng & Units 11/03/2020 10/01/2020 04/22/2020  Total Protein 6.5 - 8.1 g/dL 7.1 6.4(L) 6.8  Albumin 3.5 - 5.0 g/dL 3.6 3.2(L) 3.8  AST 15 - 41 U/L _1 ALT 0 - 44 U/L _2 Alk Phosphatase 38 - 126 U/L 73 76 78  Total Bilirubin 0.3 - 1.2 mg/dL 0.7 0.6 0.2    CBC Latest Ref Rng & Units 11/03/2020 10/12/2020 10/01/2020  WBC 4.0 - 10.5 K/uL 8.0 7.8 5.4  Hemoglobin 12.0 - 15.0 g/dL 12.3 11.0(L) 11.2(L)  Hematocrit 36.0 - 46.0 % 37.8 33.9(L) 34.1(L)  Platelets 150 - 400 K/uL 417(H) 339 298   Lab Results  Component Value Date   MCV 90.9 11/03/2020   MCV 92.1 10/12/2020   MCV 91.2 10/01/2020   Lab Results  Component Value Date   TSH 2.52 05/25/2016   Lab Results  Component Value Date   HGBA1C 10.5 (A) 10/28/2020     BNP No results found for: BNP  ProBNP No results found for: PROBNP   Lipid Panel     Component Value Date/Time   CHOL 173 10/28/2020 0956   TRIG 116 10/28/2020 0956   HDL 40 10/28/2020 0956   CHOLHDL 4.3 10/28/2020 0956   CHOLHDL 4.7 05/25/2016 1543   VLDL 37 (H) 05/25/2016 1543   LDLCALC 112 (H) 10/28/2020 0956   LABVLDL 21 10/28/2020 0956     RADIOLOGY: DG Knee Complete 4 Views Left  Result Date: 11/27/2020 CLINICAL DATA:  LEFT knee pain and swelling in a 45 year old female, 3 weeks duration. EXAM: LEFT KNEE - COMPLETE 4+ VIEW COMPARISON:  None FINDINGS: No acute fracture or dislocation.  No destructive bony process. Possible small suprapatellar effusion. Mild soft tissue swelling suggested over the distal anterior thigh. Mild patellofemoral degenerative changes. IMPRESSION: No acute bony abnormality. Possible small suprapatellar effusion. Mild soft tissue swelling suggested  over the distal anterior thigh correlate with any signs of cellulitis or soft tissue swelling on physical exam. Electronically Signed   By: Zetta Bills M.D.   On: 11/27/2020 12:07     Additional studies/ records that were reviewed today include:  I reviewed the patient's sleep study from 2017.  Her echo from April 28, 2015 was reviewed which showed an EF at 55 to 60% with normal wall motion.  There was mild thickening and calcification of her mitral valve with trivial MR   ASSESSMENT:    1. Essential hypertension   2. Atypical chest pain   3. Snoring   4. Class 1 obesity due to excess calories without serious comorbidity with body mass index (BMI) of 34.0 to 34.9 in adult   5. Hyperlipidemia with target LDL less than 70      PLAN:  Mariah Miller is a 45 year old African-American female who has a history of hypertension, diabetes mellitus, obesity, seizure history, who has had 2 recent ER evaluations for atypical sharp left-sided chest pain.  Her chest pain is described as a stabbing sensation which occurs rarely.  She denies any chest pressure or tightness with activity she denies any exertional dyspnea.  Her symptoms last intermittently for seconds to a minute and are nonradiating.  Presently, her blood pressure is not mildly increased systolically with normal diastolic blood pressure.  She is on a regimen consisting of lisinopril HCT 40/25 mg daily in addition to metoprolol tartrate 50 mg twice a day.  In 2017 an echo Doppler study showed normal LV systolic function with EF at 55 to 60% without regional wall motion abnormalities.  There was trivial mitral regurgitation.  A sleep study done at that time was negative for obstructive sleep apnea and there was no significant oxygen desaturation during sleep although she did have moderate snoring.  She works 12-hour shifts typically from 7 AM to 7 PM or from 7 PM to 7 AM and changes on a monthly basis.  She believes she is sleeping well  however we discussed the effects of shift work with reference to her circadian rhythm and potentially creating sleep issues.  I discussed optimal sleep duration at 7 and 9 hours in an adult.  I have recommended she discontinue her metoprolol tartrate and in its place changed this to metoprolol succinate and take 100 mg every morning.  She had been on simvastatin 40 mg but this has recently been changed to atorvastatin 80 mg for hyperlipidemia.  With her diabetes mellitus, target LDL is less than 70.  Her last echo Doppler study was over 5 years ago and I am recommending a follow-up echocardiographic evaluation.  She is scheduled to see her primary physician in several months and repeat laboratory will be obtained.  I will see her in 3 to 4 months for follow-up evaluation or sooner as needed   Medication Adjustments/Labs and Tests Ordered: Current medicines are reviewed at length with the patient today.  Concerns regarding medicines are outlined above.  Medication changes, Labs and Tests ordered today are listed in the Patient Instructions below. Patient Instructions  Medication Instructions:  STOP metoprolol tartrate (Lopressor) START metoprolol succinate (Toprol XL) 100 mg once daily  *If you need a refill on your cardiac medications before your next appointment, please call your pharmacy*  Testing/Procedures: Your physician has requested that you have an echocardiogram. Echocardiography is a painless test that uses sound waves to create images of your heart. It provides your doctor with information about the size and shape of your heart and how well your heart's chambers and valves are working. This procedure takes approximately one hour. There are no restrictions for this procedure.  This will be done at our [facility] location:  [place]: At [facility], you and your health needs are our priority.  As part of our continuing mission to provide you with  exceptional heart care, we have created designated Provider Care Teams.  These Care Teams include your primary Cardiologist (physician) and Advanced Practice Providers (APPs -  Physician Assistants and Nurse Practitioners) who all work together to provide you with the care you need, when you need it.  We recommend signing up for the patient portal called "MyChart".  Sign up information is provided on this After Visit Summary.  MyChart is used to connect with patients for Virtual Visits (Telemedicine).  Patients are able to view lab/test results, encounter notes, upcoming appointments, etc.  Non-urgent messages can be sent to your provider as well.   To learn more about what you can do with MyChart, go to [email or web address].    Your next appointment:   3-4 month(s)  The format for your next appointment:   In Person  Provider:   Shelva Majestic, MD     Signed, Shelva Majestic, MD  12/22/2020 4:47 PM    [facility] [address], [address], [place], [place]  [address] Phone: [phone]

## 2020-12-14 NOTE — Patient Instructions (Signed)
Medication Instructions:  STOP metoprolol tartrate (Lopressor) START metoprolol succinate (Toprol XL) 100 mg once daily  *If you need a refill on your cardiac medications before your next appointment, please call your pharmacy*  Testing/Procedures: Your physician has requested that you have an echocardiogram. Echocardiography is a painless test that uses sound waves to create images of your heart. It provides your doctor with information about the size and shape of your heart and how well your heart's chambers and valves are working. This procedure takes approximately one hour. There are no restrictions for this procedure.  This will be done at our [facility] location:  [employer] [address]  Follow-Up: At [employer], you and your health needs are our priority.  As part of our continuing mission to provide you with exceptional heart care, we have created designated Provider Care Teams.  These Care Teams include your primary Cardiologist (physician) and Advanced Practice Providers (APPs -  Physician Assistants and Nurse Practitioners) who all work together to provide you with the care you need, when you need it.  We recommend signing up for the patient portal called "MyChart".  Sign up information is provided on this After Visit Summary.  MyChart is used to connect with patients for Virtual Visits (Telemedicine).  Patients are able to view lab/test results, encounter notes, upcoming appointments, etc.  Non-urgent messages can be sent to your provider as well.   To learn more about what you can do with MyChart, go to [email or web address].    Your next appointment:   3-4 month(s)  The format for your next appointment:   In Person  Provider:   Nicki Guadalaj[place], MD

## 2020-12-15 ENCOUNTER — Other Ambulatory Visit (HOSPITAL_BASED_OUTPATIENT_CLINIC_OR_DEPARTMENT_OTHER): Payer: Self-pay

## 2020-12-16 ENCOUNTER — Other Ambulatory Visit: Payer: Self-pay

## 2020-12-16 ENCOUNTER — Ambulatory Visit: Payer: Managed Care, Other (non HMO) | Admitting: Pharmacist

## 2020-12-22 ENCOUNTER — Telehealth: Payer: Managed Care, Other (non HMO) | Admitting: Physician Assistant

## 2020-12-22 ENCOUNTER — Encounter: Payer: Self-pay | Admitting: Cardiovascular Disease

## 2020-12-22 ENCOUNTER — Ambulatory Visit: Payer: Self-pay | Admitting: *Deleted

## 2020-12-22 ENCOUNTER — Other Ambulatory Visit: Payer: Self-pay

## 2020-12-22 DIAGNOSIS — K529 Noninfective gastroenteritis and colitis, unspecified: Secondary | ICD-10-CM

## 2020-12-22 MED ORDER — ONDANSETRON 4 MG PO TBDP
4.0000 mg | ORAL_TABLET | Freq: Three times a day (TID) | ORAL | 0 refills | Status: DC | PRN
  Filled 2020-12-22: qty 20, 7d supply, fill #0

## 2020-12-22 NOTE — Telephone Encounter (Signed)
Scheduled virtual apt for patient. 12/23/2020 at 0810.

## 2020-12-22 NOTE — Telephone Encounter (Signed)
Reason for Disposition  MILD vomiting with diarrhea  Answer Assessment - Initial Assessment Questions 1. VOMITING SEVERITY: "How many times have you vomited in the past 24 hours?"     - MILD:  1 - 2 times/day    - MODERATE: 3 - 5 times/day, decreased oral intake without significant weight loss or symptoms of dehydration    - SEVERE: 6 or more times/day, vomits everything or nearly everything, with significant weight loss, symptoms of dehydration      None 2. ONSET: "When did the vomiting begin?"      Saturday into Monday early morning 3. FLUIDS: "What fluids or food have you vomited up today?" "Have you been able to keep any fluids down?"     Normal diet 4. ABDOMINAL PAIN: "Are your having any abdominal pain?" If yes : "How bad is it and what does it feel like?" (e.g., crampy, dull, intermittent, constant)      Slight abdominal pain now- left side 5. DIARRHEA: "Is there any diarrhea?" If Yes, ask: "How many times today?"      Sunday only 6. CONTACTS: "Is there anyone else in the family with the same symptoms?"      Family- granddaughter had stomach virus 7. CAUSE: "What do you think is causing your vomiting?"     viral 8. HYDRATION STATUS: "Any signs of dehydration?" (e.g., dry mouth [not only dry lips], too weak to stand) "When did you last urinate?"     Normal hydration- she did have elevated glucose during illness- they are better now. 9. OTHER SYMPTOMS: "Do you have any other symptoms?" (e.g., fever, headache, vertigo, vomiting blood or coffee grounds, recent head injury)     Headache Monday- no other symptoms since and negative COVID test 10. PREGNANCY: "Is there any chance you are pregnant?" "When was your last menstrual period?"       N/a, LMP- 10/9  Protocols used: Vomiting-A-AH

## 2020-12-22 NOTE — Progress Notes (Signed)
[name], west are scheduled for a virtual visit with your provider today.    Just as we do with appointments in the office, we must obtain your consent to participate.  Your consent will be active for this visit and any virtual visit you may have with one of our providers in the next 365 days.    If you have a MyChart account, I can also send a copy of this consent to you electronically.  All virtual visits are billed to your insurance company just like a traditional visit in the office.  As this is a virtual visit, video technology does not allow for your provider to perform a traditional examination.  This may limit your provider's ability to fully assess your condition.  If your provider identifies any concerns that need to be evaluated in person or the need to arrange testing such as labs, EKG, etc, we will make arrangements to do so.    Although advances in technology are sophisticated, we cannot ensure that it will always work on either your end or our end.  If the connection with a video visit is poor, we may have to switch to a telephone visit.  With either a video or telephone visit, we are not always able to ensure that we have a secure connection.   I need to obtain your verbal consent now.   Are you willing to proceed with your visit today?   Mariah Miller has provided verbal consent on 12/22/2020 for a virtual visit (video or telephone).   Mariah Meres, PA-C 12/22/2020  9:34 AM   Date:  12/22/2020   ID:  Mariah Miller, DOB [birth year], MRN [id number]  Patient Location: Home Provider Location: Home Office   Participants: Patient and Provider for Visit and Wrap up  Method of visit: Video  Location of Patient: Home Location of Provider: Home Office Consent was obtain for visit over the video. Services rendered by provider: Visit was performed via video  A video enabled telemedicine application was used and I verified that I am speaking with the correct person  using two identifiers.  PCP:  Mariah Register, MD   Chief Complaint:  nv  History of Present Illness:    Mariah Miller is a 45 y.o. female with history as stated below. Presents video telehealth for an acute care visit  Pt states that she started to have nausea, vomiting, and diarrhea 4 days ago. She states that her symptoms are completely resolved.   She took a COVID test at home that was negative.   She denies and severe abd pain. Denies fevers, chills, body aches, URI sxs. She has been able to tolerate PO currently but needs a note to go back to work  States that she was recently around a family member who had similar symptoms.  Past Medical, Surgical, Social History, Allergies, and Medications have been Reviewed.  Past Medical History:  Diagnosis Date   Depression    Diabetes mellitus without complication (HCC)    Hypertension    Migraine    Seizures (HCC)    self reported - last seizure was Oct 2019 "absent seizure"    Current Meds  Medication Sig   ondansetron (ZOFRAN ODT) 4 MG disintegrating tablet Take 1 tablet (4 mg total) by mouth every 8 (eight) hours as needed for nausea or vomiting.     Allergies:   Tomato   ROS See HPI for history of present illness.  Physical Exam Vitals and nursing  note reviewed.  Constitutional:      General: She is not in acute distress.    Appearance: She is well-developed.  HENT:     Head: Normocephalic and atraumatic.  Eyes:     Conjunctiva/sclera: Conjunctivae normal.  Pulmonary:     Effort: Pulmonary effort is normal.  Musculoskeletal:     Cervical back: Neck supple.  Neurological:     Mental Status: She is alert.          MDM: Pt had sxs of viral gastroenteritis 4 days ago. Sxs resolved but needs a note to return back to work. Covid neg. Now tolerating pos. Min abd pain so doubt emergent process. Zofran given. Work note given. Advised on f/u and she is in agreement.      There are no diagnoses linked to this  encounter.   Time:   Today, I have spent 10 minutes with the patient with telehealth technology discussing the above problems, reviewing the chart, previous notes, medications and orders.    Tests Ordered: No orders of the defined types were placed in this encounter.   Medication Changes: Meds ordered this encounter  Medications   ondansetron (ZOFRAN ODT) 4 MG disintegrating tablet    Sig: Take 1 tablet (4 mg total) by mouth every 8 (eight) hours as needed for nausea or vomiting.    Dispense:  20 tablet    Refill:  0    Order Specific Question:   Supervising Provider    Answer:   Mariah Miller [[id number]]     Disposition:  Follow up  Signed, Mariah Samons Manfred Shirts, PA-C  12/22/2020 9:34 AM

## 2020-12-22 NOTE — Telephone Encounter (Signed)
Patient is calling to report she had GI virus- vomiting/diarrhea over the weekend- she is better- but needs note for work. Patient reports negative COVID test, no fever , stools normal and she is back on normal diet. Patient states her work is requiring a letter from provider releasing her back to work. No appointment in office advised virtual visit.

## 2020-12-22 NOTE — Patient Instructions (Signed)
Mariah Miller, thank you for joining Rodney Booze, PA-C for today's virtual visit.  While this provider is not your primary care provider (PCP), if your PCP is located in our provider database this encounter information will be shared with them immediately following your visit.  Consent: (Patient) Mariah Miller provided verbal consent for this virtual visit at the beginning of the encounter.  Current Medications:  Current Outpatient Medications:    ondansetron (ZOFRAN ODT) 4 MG disintegrating tablet, Take 1 tablet (4 mg total) by mouth every 8 (eight) hours as needed for nausea or vomiting., Disp: 20 tablet, Rfl: 0   aspirin EC 81 MG tablet, Take 1 tablet (81 mg total) by mouth daily., Disp: 30 tablet, Rfl: 5   atorvastatin (LIPITOR) 80 MG tablet, Take 1 tablet (80 mg total) by mouth daily., Disp: 30 tablet, Rfl: 6   Blood Glucose Monitoring Suppl (TRUE METRIX METER) w/Device KIT, USE AS DIRECTED (Patient taking differently: 1 each by Other route as directed.), Disp: 1 kit, Rfl: 0   Clotrimazole 1 % OINT, Apply 1 each topically 2 (two) times daily., Disp: 56.7 g, Rfl: 0   dapagliflozin propanediol (FARXIGA) 10 MG TABS tablet, Take 1 tablet (10 mg total) by mouth daily before breakfast., Disp: 30 tablet, Rfl: 6   Dulaglutide (TRULICITY) 1.5 SN/0.5LZ SOPN, Inject 1.5 mg into the skin once a week., Disp: 2 mL, Rfl: 6   etodolac (LODINE) 300 MG capsule, Take 1 capsule (300 mg total) by mouth every 8 (eight) hours., Disp: 21 capsule, Rfl: 0   ferrous sulfate 325 (65 FE) MG tablet, Take 1 tablet (325 mg total) by mouth 2 (two) times daily with a meal., Disp: 180 tablet, Rfl: 3   gabapentin (NEURONTIN) 300 MG capsule, Take 2 capsules (600 mg total) by mouth 3 (three) times daily., Disp: 120 capsule, Rfl: 6   glucose blood (TRUE METRIX BLOOD GLUCOSE TEST) test strip, Use as instructed (Patient taking differently: 1 each by Other route as directed.), Disp: 100 each, Rfl: 12   ibuprofen  (ADVIL) 600 MG tablet, Take 1 tablet (600 mg total) by mouth every 8 (eight) hours as needed., Disp: 10 tablet, Rfl: 0   insulin glargine-yfgn (SEMGLEE, YFGN,) 100 UNIT/ML Pen, Inject 40 Units into the skin 2 (two) times daily., Disp: 30 mL, Rfl: 3   insulin lispro (HUMALOG KWIKPEN) 100 UNIT/ML KwikPen, Use 0-12 units per sliding scale three times daily with meals., Disp: 15 mL, Rfl: 2   Insulin Pen Needle 32G X 4 MM MISC, USE AS INSTRUCTED TO INJECT INSULIN AND VICTOZA DAILY., Disp: 100 each, Rfl: 0   Insulin Syringe-Needle U-100 31G X 5/16" 0.3 ML MISC, USE TO INJECT INSULIN NOVOLOG TID., Disp: 100 each, Rfl: 0   lisinopril-hydrochlorothiazide (ZESTORETIC) 20-12.5 MG tablet, TAKE 2 TABLETS BY MOUTH DAILY., Disp: 180 tablet, Rfl: 1   metFORMIN (GLUCOPHAGE) 1000 MG tablet, Take 1 tablet (1,000 mg total) by mouth 2 (two) times daily with a meal., Disp: 180 tablet, Rfl: 1   metoprolol succinate (TOPROL-XL) 100 MG 24 hr tablet, Take 1 tablet (100 mg total) by mouth daily. Take with or immediately following a meal., Disp: 90 tablet, Rfl: 3   metroNIDAZOLE (METROGEL) 0.75 % vaginal gel, PLACE 1 APPLICATORFUL VAGINALLY AT BEDTIME., Disp: 70 g, Rfl: 0   ondansetron (ZOFRAN ODT) 4 MG disintegrating tablet, Take 1 tablet (4 mg total) by mouth every 8 (eight) hours as needed for nausea or vomiting., Disp: 20 tablet, Rfl: 0   predniSONE (  DELTASONE) 20 MG tablet, Take 1 tablet (20 mg total) by mouth daily with breakfast., Disp: 5 tablet, Rfl: 0   promethazine (PHENERGAN) 25 MG tablet, Take 1 tablet (25 mg total) by mouth every 6 (six) hours as needed for nausea or vomiting., Disp: 10 tablet, Rfl: 0   sucralfate (CARAFATE) 1 g tablet, Take 1 tablet (1 g total) by mouth 4 (four) times daily -  with meals and at bedtime., Disp: 60 tablet, Rfl: 0   traZODone (DESYREL) 100 MG tablet, Take 1 tablet (100 mg total) by mouth at bedtime as needed for sleep., Disp: 30 tablet, Rfl: 3   TRUEplus Lancets 28G MISC, USE AS  INSTRUCTED (Patient taking differently: 1 each by Other route as directed.), Disp: 100 each, Rfl: 5   Medications ordered in this encounter:  Meds ordered this encounter  Medications   ondansetron (ZOFRAN ODT) 4 MG disintegrating tablet    Sig: Take 1 tablet (4 mg total) by mouth every 8 (eight) hours as needed for nausea or vomiting.    Dispense:  20 tablet    Refill:  0    Order Specific Question:   Supervising Provider    Answer:   Sabra Heck, BRIAN [[id number]]     *If you need refills on other medications prior to your next appointment, please contact your pharmacy*  Follow-Up: Call back or seek an in-person evaluation if the symptoms worsen or if the condition fails to improve as anticipated.  Other Instructions Take zofran as directed   Follow up with your primary care provider in 1 week if symptoms fail to improve and follow up in the ER if symptoms worsen   If you have been instructed to have an in-person evaluation today at a local Urgent Care facility, please use the link below. It will take you to a list of all of our available [facility], including address, phone number and hours of operation. Please do not delay care.  [facility]  If you or a family member do not have a primary care provider, use the link below to schedule a visit and establish care. When you choose a [facility] primary care physician or advanced practice provider, you gain a long-term partner in health. Find a Primary Care Provider  Learn more about [facility]'s in-office and virtual care options: [facility]

## 2020-12-22 NOTE — Telephone Encounter (Signed)
Left voicemail to return call.  Attempt to call patient to schedule virtual appt.

## 2020-12-23 ENCOUNTER — Telehealth (HOSPITAL_BASED_OUTPATIENT_CLINIC_OR_DEPARTMENT_OTHER): Payer: Managed Care, Other (non HMO) | Admitting: Family Medicine

## 2020-12-23 ENCOUNTER — Encounter: Payer: Self-pay | Admitting: Family Medicine

## 2020-12-23 DIAGNOSIS — A084 Viral intestinal infection, unspecified: Secondary | ICD-10-CM

## 2020-12-23 NOTE — Progress Notes (Signed)
Virtual Visit via Video Note  I connected with Mariah Miller, on 12/23/2020 at 8:58 AM by video enabled telemedicine device due to the COVID-19 pandemic and verified that I am speaking with the correct person using two identifiers.   Consent: I discussed the limitations, risks, security and privacy concerns of performing an evaluation and management service by telemedicine and the availability of in person appointments. I also discussed with the patient that there may be a patient responsible charge related to this service. The patient expressed understanding and agreed to proceed.   Location of Patient: Home  Location of Provider: Clinic   Persons participating in Telemedicine visit: Mariah Miller Dr. Alvis Lemmings     History of Present Illness: Mariah Miller is a 45 y.o. year old female  with a history of type 2 diabetes mellitus (A1c 10.5), hypertension, depression.   She has had nausea and diarrhea which started 4 days ago at work. Her Grandkids have had similar symptoms.  Denies presence of fever, myalgias. Drinking water and Gatorade and chicken noodle soup. She would like a note to be out of work to return on 12/28/2020.  Past Medical History:  Diagnosis Date   Depression    Diabetes mellitus without complication (HCC)    Hypertension    Migraine    Seizures (HCC)    self reported - last seizure was Oct 2019 "absent seizure"   Allergies  Allergen Reactions   Tomato Itching and Rash    Current Outpatient Medications on File Prior to Visit  Medication Sig Dispense Refill   aspirin EC 81 MG tablet Take 1 tablet (81 mg total) by mouth daily. 30 tablet 5   atorvastatin (LIPITOR) 80 MG tablet Take 1 tablet (80 mg total) by mouth daily. 30 tablet 6   Blood Glucose Monitoring Suppl (TRUE METRIX METER) w/Device KIT USE AS DIRECTED (Patient taking differently: 1 each by Other route as directed.) 1 kit 0   Clotrimazole 1 % OINT Apply 1 each topically 2 (two)  times daily. 56.7 g 0   dapagliflozin propanediol (FARXIGA) 10 MG TABS tablet Take 1 tablet (10 mg total) by mouth daily before breakfast. 30 tablet 6   Dulaglutide (TRULICITY) 1.5 MG/0.5ML SOPN Inject 1.5 mg into the skin once a week. 2 mL 6   etodolac (LODINE) 300 MG capsule Take 1 capsule (300 mg total) by mouth every 8 (eight) hours. 21 capsule 0   ferrous sulfate 325 (65 FE) MG tablet Take 1 tablet (325 mg total) by mouth 2 (two) times daily with a meal. 180 tablet 3   gabapentin (NEURONTIN) 300 MG capsule Take 2 capsules (600 mg total) by mouth 3 (three) times daily. 120 capsule 6   glucose blood (TRUE METRIX BLOOD GLUCOSE TEST) test strip Use as instructed (Patient taking differently: 1 each by Other route as directed.) 100 each 12   ibuprofen (ADVIL) 600 MG tablet Take 1 tablet (600 mg total) by mouth every 8 (eight) hours as needed. 10 tablet 0   insulin glargine-yfgn (SEMGLEE, YFGN,) 100 UNIT/ML Pen Inject 40 Units into the skin 2 (two) times daily. 30 mL 3   insulin lispro (HUMALOG KWIKPEN) 100 UNIT/ML KwikPen Use 0-12 units per sliding scale three times daily with meals. 15 mL 2   Insulin Pen Needle 32G X 4 MM MISC USE AS INSTRUCTED TO INJECT INSULIN AND VICTOZA DAILY. 100 each 0   Insulin Syringe-Needle U-100 31G X 5/16" 0.3 ML MISC USE TO INJECT INSULIN NOVOLOG  TID. 100 each 0   lisinopril-hydrochlorothiazide (ZESTORETIC) 20-12.5 MG tablet TAKE 2 TABLETS BY MOUTH DAILY. 180 tablet 1   metFORMIN (GLUCOPHAGE) 1000 MG tablet Take 1 tablet (1,000 mg total) by mouth 2 (two) times daily with a meal. 180 tablet 1   metoprolol succinate (TOPROL-XL) 100 MG 24 hr tablet Take 1 tablet (100 mg total) by mouth daily. Take with or immediately following a meal. 90 tablet 3   metroNIDAZOLE (METROGEL) 0.75 % vaginal gel PLACE 1 APPLICATORFUL VAGINALLY AT BEDTIME. 70 g 0   ondansetron (ZOFRAN ODT) 4 MG disintegrating tablet Take 1 tablet (4 mg total) by mouth every 8 (eight) hours as needed for nausea or  vomiting. 20 tablet 0   ondansetron (ZOFRAN ODT) 4 MG disintegrating tablet Take 1 tablet (4 mg total) by mouth every 8 (eight) hours as needed for nausea or vomiting. 20 tablet 0   predniSONE (DELTASONE) 20 MG tablet Take 1 tablet (20 mg total) by mouth daily with breakfast. 5 tablet 0   promethazine (PHENERGAN) 25 MG tablet Take 1 tablet (25 mg total) by mouth every 6 (six) hours as needed for nausea or vomiting. 10 tablet 0   sucralfate (CARAFATE) 1 g tablet Take 1 tablet (1 g total) by mouth 4 (four) times daily -  with meals and at bedtime. 60 tablet 0   traZODone (DESYREL) 100 MG tablet Take 1 tablet (100 mg total) by mouth at bedtime as needed for sleep. 30 tablet 3   TRUEplus Lancets 28G MISC USE AS INSTRUCTED (Patient taking differently: 1 each by Other route as directed.) 100 each 5   No current facility-administered medications on file prior to visit.    ROS: See HPI  Observations/Objective: Awake, alert, oriented, no evidence of dehydration Not in acute distress Normal mood Full range of motion of extremities  CMP Latest Ref Rng & Units 11/03/2020 10/28/2020 10/12/2020  Glucose 70 - 99 mg/dL 322(H) 120(H) 196(H)  BUN 6 - 20 mg/dL 23(H) 13 19  Creatinine 0.44 - 1.00 mg/dL 1.35(H) 0.92 1.16(H)  Sodium 135 - 145 mmol/L 133(L) 138 133(L)  Potassium 3.5 - 5.1 mmol/L 3.7 4.8 4.1  Chloride 98 - 111 mmol/L 98 102 101  CO2 22 - 32 mmol/L $RemoveB'25 22 26  'iqrItovR$ Calcium 8.9 - 10.3 mg/dL 8.8(L) 9.2 8.9  Total Protein 6.5 - 8.1 g/dL 7.1 - -  Total Bilirubin 0.3 - 1.2 mg/dL 0.7 - -  Alkaline Phos 38 - 126 U/L 73 - -  AST 15 - 41 U/L 19 - -  ALT 0 - 44 U/L 14 - -    Lipid Panel     Component Value Date/Time   CHOL 173 10/28/2020 0956   TRIG 116 10/28/2020 0956   HDL 40 10/28/2020 0956   CHOLHDL 4.3 10/28/2020 0956   CHOLHDL 4.7 05/25/2016 1543   VLDL 37 (H) 05/25/2016 1543   LDLCALC 112 (H) 10/28/2020 0956   LABVLDL 21 10/28/2020 0956    Lab Results  Component Value Date   HGBA1C 10.5  (A) 10/28/2020     Assessment and Plan: 1. Viral gastroenteritis She did receive a prescription for Zofran yesterday and has been advised to take bed Continue with increased fluid intake, progress with oral diet has tolerated She has no evidence of dehydration on observation Note has been provided for work   Follow Up Instructions: Keep previously scheduled appointment for chronic disease management   I discussed the assessment and treatment plan with the patient. The patient was  provided an opportunity to ask questions and all were answered. The patient agreed with the plan and demonstrated an understanding of the instructions.   The patient was advised to call back or seek an in-person evaluation if the symptoms worsen or if the condition fails to improve as anticipated.     I provided 13 minutes total of Telehealth time during this encounter including median intraservice time, reviewing previous notes, investigations, ordering medications, medical decision making, coordinating care and patient verbalized understanding at the end of the visit.     Charlott Rakes, MD, FAAFP. [facility] [place], [place]   12/23/2020, 8:58 AM

## 2020-12-28 ENCOUNTER — Other Ambulatory Visit: Payer: Self-pay

## 2020-12-29 ENCOUNTER — Other Ambulatory Visit: Payer: Self-pay

## 2020-12-30 ENCOUNTER — Other Ambulatory Visit: Payer: Self-pay

## 2020-12-30 ENCOUNTER — Ambulatory Visit (HOSPITAL_COMMUNITY): Payer: Managed Care, Other (non HMO) | Attending: Cardiology

## 2020-12-30 DIAGNOSIS — R079 Chest pain, unspecified: Secondary | ICD-10-CM

## 2020-12-30 DIAGNOSIS — I10 Essential (primary) hypertension: Secondary | ICD-10-CM | POA: Diagnosis not present

## 2020-12-30 LAB — ECHOCARDIOGRAM COMPLETE
Area-P 1/2: 4.31 cm2
S' Lateral: 3 cm

## 2020-12-31 ENCOUNTER — Other Ambulatory Visit: Payer: Self-pay

## 2021-01-01 IMAGING — DX DG CHEST 2V
2 series · 2 of 2 positions shown · non-contrast
Comparison: May 09, 2017.

CLINICAL DATA: Shortness of breath.

EXAM:
CHEST - 2 VIEW

[chest pa]
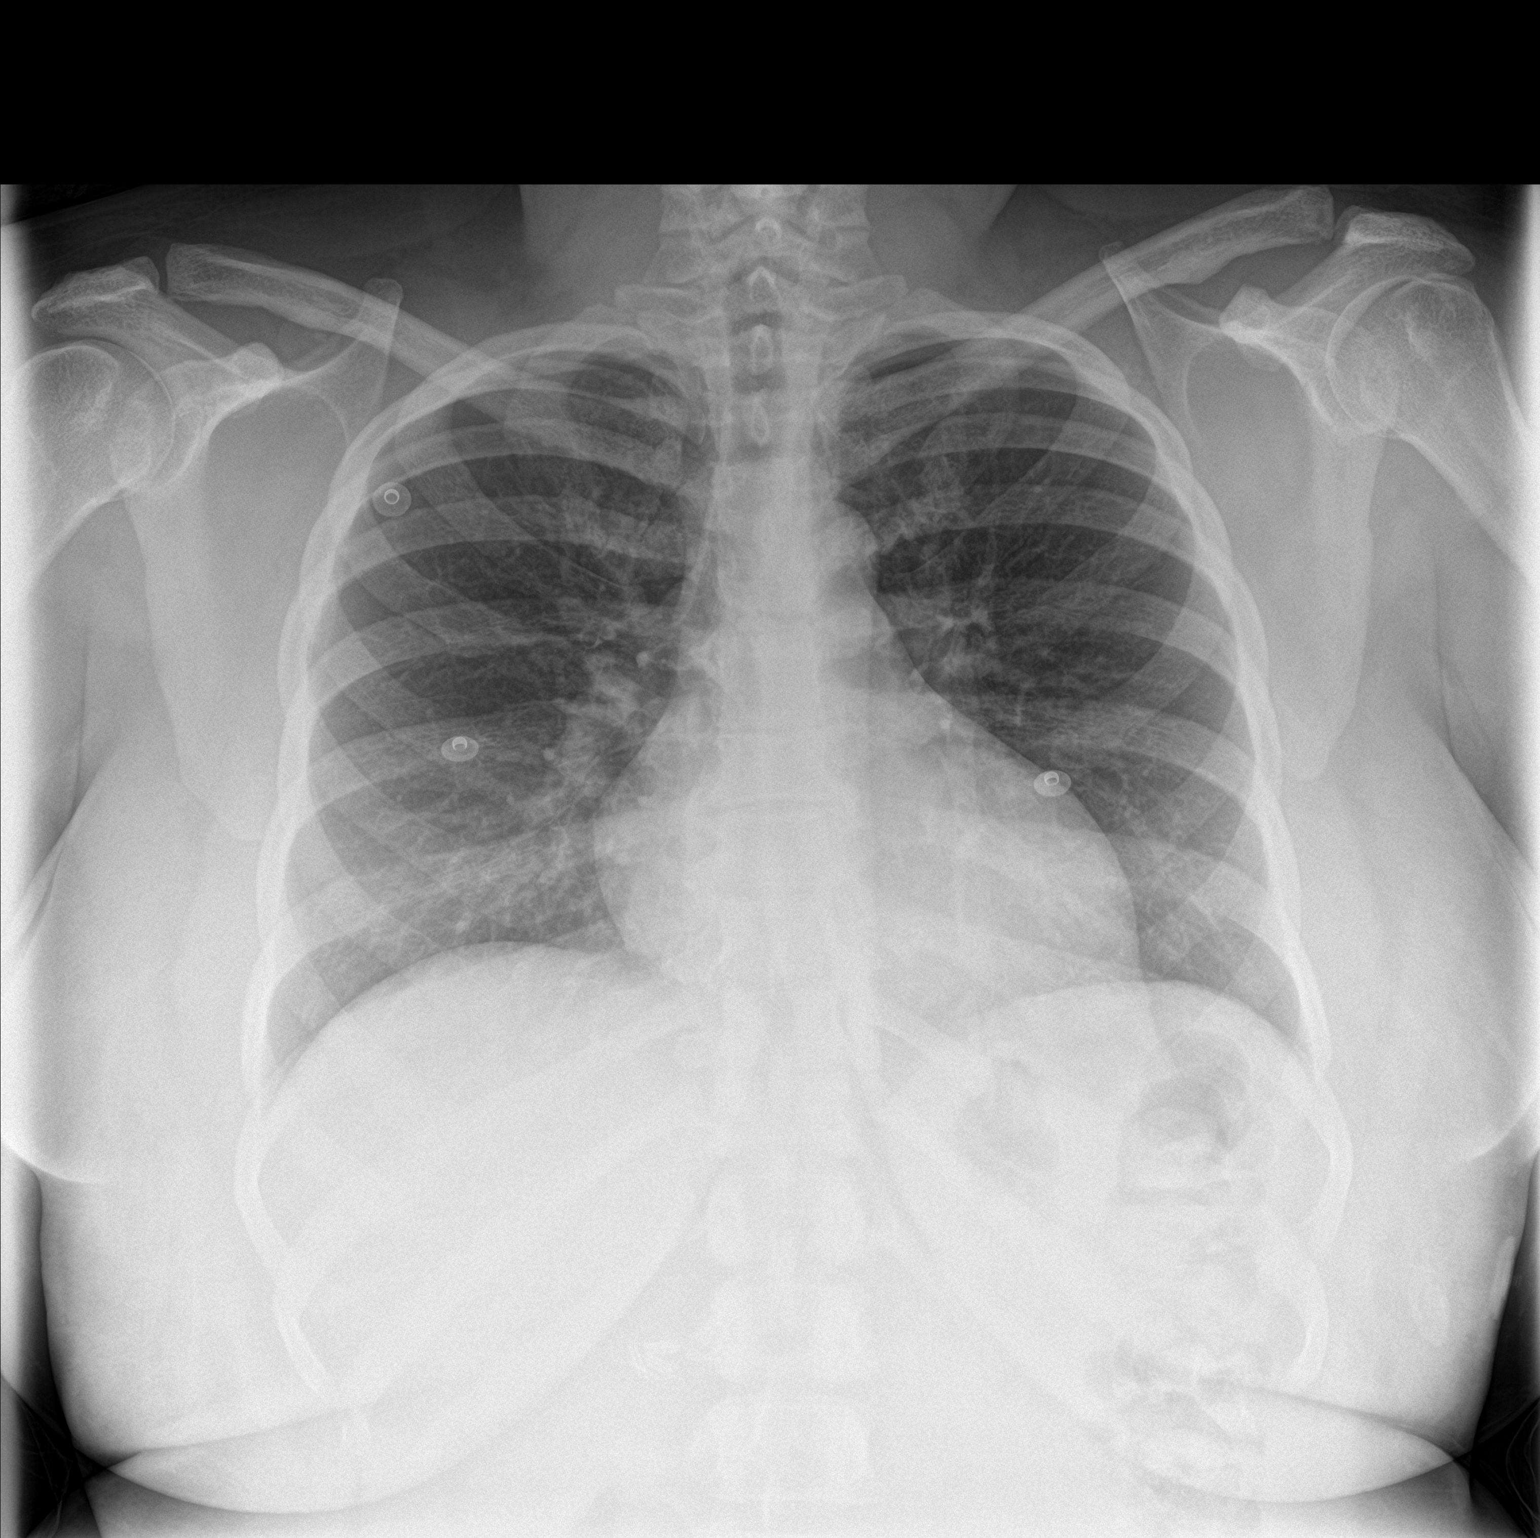

[chest lat]
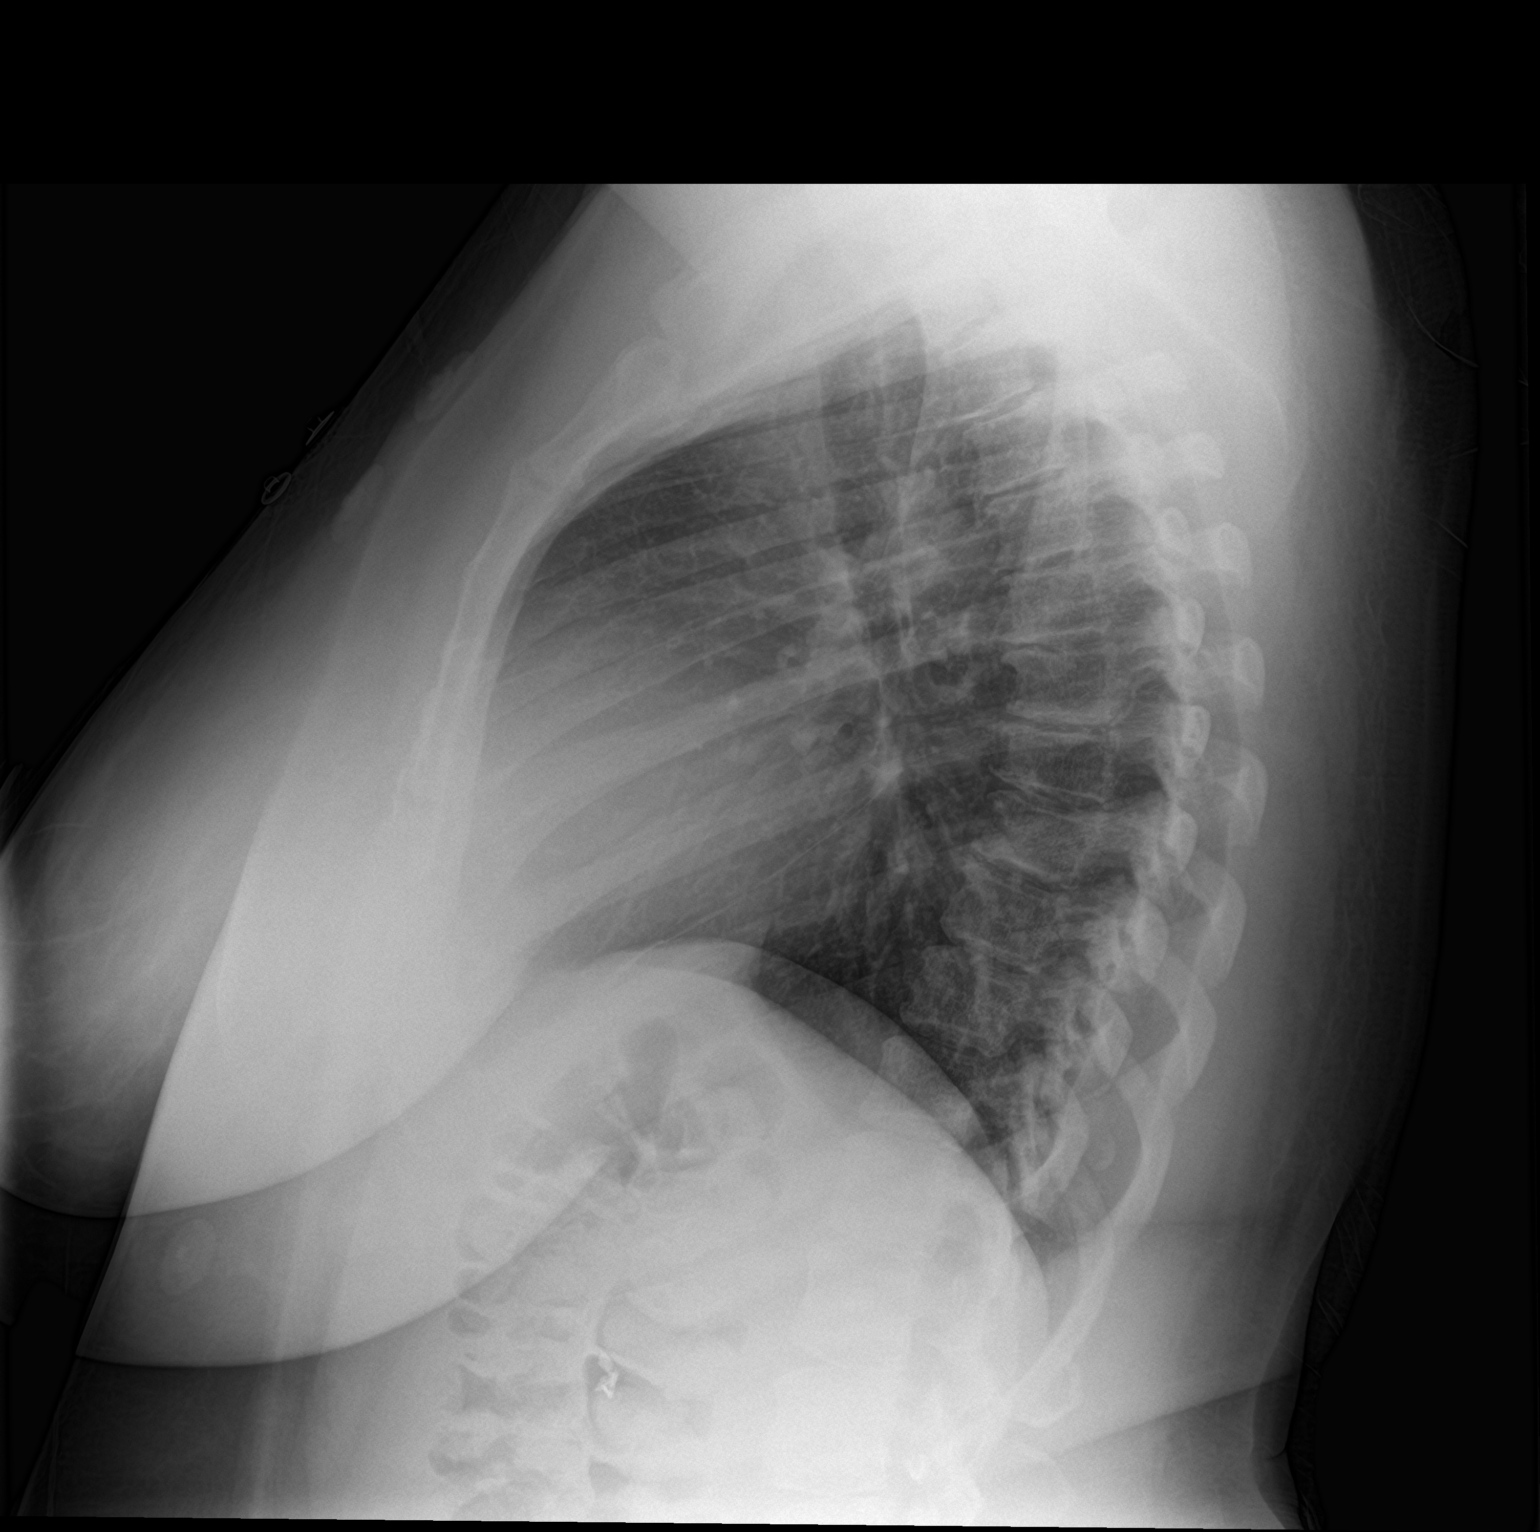

[2 of 2 positions shown; findings below may reference images not displayed]

FINDINGS: The heart size and mediastinal contours are within normal limits.
Both lungs are clear. No pneumothorax or pleural effusion is noted.
The visualized skeletal structures are unremarkable.
IMPRESSION: No active cardiopulmonary disease.

## 2021-01-22 ENCOUNTER — Other Ambulatory Visit: Payer: Self-pay

## 2021-01-26 ENCOUNTER — Other Ambulatory Visit: Payer: Self-pay

## 2021-02-02 ENCOUNTER — Other Ambulatory Visit: Payer: Self-pay

## 2021-02-05 ENCOUNTER — Other Ambulatory Visit: Payer: Self-pay

## 2021-02-05 MED ORDER — MELOXICAM 15 MG PO TABS
ORAL_TABLET | ORAL | 0 refills | Status: DC
  Filled 2021-02-05: qty 30, 30d supply, fill #0

## 2021-02-07 MED FILL — Lisinopril & Hydrochlorothiazide Tab 20-12.5 MG: ORAL | 30 days supply | Qty: 60 | Fill #1 | Status: AC

## 2021-02-08 ENCOUNTER — Other Ambulatory Visit: Payer: Self-pay

## 2021-02-09 ENCOUNTER — Other Ambulatory Visit: Payer: Self-pay

## 2021-02-25 ENCOUNTER — Ambulatory Visit: Payer: Managed Care, Other (non HMO)

## 2021-02-26 ENCOUNTER — Other Ambulatory Visit: Payer: Self-pay

## 2021-03-02 ENCOUNTER — Ambulatory Visit: Payer: Managed Care, Other (non HMO) | Admitting: Family Medicine

## 2021-03-04 ENCOUNTER — Ambulatory Visit: Payer: Managed Care, Other (non HMO) | Admitting: Family Medicine

## 2021-03-17 ENCOUNTER — Ambulatory Visit: Payer: Managed Care, Other (non HMO) | Attending: Critical Care Medicine | Admitting: Critical Care Medicine

## 2021-03-17 ENCOUNTER — Encounter: Payer: Self-pay | Admitting: Critical Care Medicine

## 2021-03-17 ENCOUNTER — Other Ambulatory Visit: Payer: Self-pay

## 2021-03-17 VITALS — BP 145/85 | HR 80 | Ht 65.0 in | Wt 211.2 lb

## 2021-03-17 DIAGNOSIS — I10 Essential (primary) hypertension: Secondary | ICD-10-CM

## 2021-03-17 DIAGNOSIS — E1142 Type 2 diabetes mellitus with diabetic polyneuropathy: Secondary | ICD-10-CM

## 2021-03-17 DIAGNOSIS — F32A Depression, unspecified: Secondary | ICD-10-CM

## 2021-03-17 DIAGNOSIS — Z794 Long term (current) use of insulin: Secondary | ICD-10-CM

## 2021-03-17 DIAGNOSIS — E1140 Type 2 diabetes mellitus with diabetic neuropathy, unspecified: Secondary | ICD-10-CM | POA: Diagnosis not present

## 2021-03-17 DIAGNOSIS — F419 Anxiety disorder, unspecified: Secondary | ICD-10-CM

## 2021-03-17 DIAGNOSIS — E1165 Type 2 diabetes mellitus with hyperglycemia: Secondary | ICD-10-CM

## 2021-03-17 DIAGNOSIS — J0101 Acute recurrent maxillary sinusitis: Secondary | ICD-10-CM | POA: Diagnosis not present

## 2021-03-17 LAB — POCT GLYCOSYLATED HEMOGLOBIN (HGB A1C): HbA1c, POC (controlled diabetic range): 8.6 % — AB (ref 0.0–7.0)

## 2021-03-17 MED ORDER — CEFDINIR 300 MG PO CAPS: 300.0000 mg | ORAL_CAPSULE | Freq: Two times a day (BID) | ORAL | 0 refills | Status: AC

## 2021-03-17 MED ORDER — FLUTICASONE PROPIONATE 50 MCG/ACT NA SUSP: 2.0000 | Freq: Every day | NASAL | 6 refills | Status: DC

## 2021-03-17 NOTE — Assessment & Plan Note (Signed)
A1c elevated 8.8 patient on a significant program of short and long-acting insulin, Farxiga, Trulicity, metformin  Diabetes currently not controlled  I went over with the patient a healthy diet to try to improve lifestyle management and did not make medication changes  Patient to return to see [facility] clinical pharmacy 3 weeks

## 2021-03-17 NOTE — Patient Instructions (Addendum)
Begin fluticasone 2 sprays each nostril daily  Begin cefdinir 1 capsule twice daily for 7 days an antibiotic for sinus infection  Pick up refill supplies for [employer] sinus rinse put bottled water at room temperature do not use tap water to the dotted line of the sinus rinse bottle and pour in the packet of salt bicarbonate mixture from [employer] and rinse both sinuses twice daily for at least 5 days and then as needed thereafter  No change in your other medications continue to follow healthy diet  Return to see Dr. Margarita Rana in 6 weeks  See Lurena Joiner our pharmacist in three weeks.  Note for work given

## 2021-03-17 NOTE — Assessment & Plan Note (Addendum)
Blood pressure mildly elevated today we will continue current medications and observe  We will have the patient come back to see clinical pharmacy in 3 weeks for both diabetes and hypertension

## 2021-03-17 NOTE — Assessment & Plan Note (Signed)
Acute maxillary sinusitis likely present given findings  Begin cefdinir 300 mg twice daily for 7 days  Avoid systemic steroids due to diabetes  Begin topical fluticasone 2 sprays each nostril daily  Use [employer] sinus rinse twice daily for 5 days as needed thereafter  May need allergy evaluation at a later date

## 2021-03-17 NOTE — Progress Notes (Signed)
Acute Office Visit  Subjective:    Patient ID: Mariah Miller, female    DOB: [birth year], 46 y.o.   MRN: [id number]  Chief Complaint  Patient presents with   Sinusitis    HPI This patient is seen today for increased sinus pressure and discomfort 1 week in duration.  She Kut tested COVID-negative at home.  She has had problems with sinus infections in the past and also has environmental allergies worse in the spring.  Patient comes in today as a work in visit.  Patient complains of sinus congestion, sinus pressure, throat irritation, but no fever.  Patient has some shortness of breath occasional wheezing.  Patient does have hypertension and type 2 diabetes A1c on arrival is 8.8 she has had elevated blood sugars at home with her current illness She is due a foot exam which will be performed at this visit  She needed medication reconciliation this was performed with the patient and medicines were reconciled on her medication list  She does not need refills on any of her medications   Past Medical History:  Diagnosis Date   Depression    Diabetes mellitus without complication (HCC)    Hypertension    Migraine    Seizures (HCC)    self reported - last seizure was Oct 2019 "absent seizure"    Past Surgical History:  Procedure Laterality Date   CHOLECYSTECTOMY     CYST EXCISION     SHOULDER OPEN ROTATOR CUFF REPAIR Left    TONSILLECTOMY     TUBAL LIGATION      Family History  Problem Relation Age of Onset   Hypertension Mother    Thyroid disease Mother    Irregular heart beat Mother    Bipolar disorder Sister    Colon cancer Other    Esophageal cancer Neg Hx    Rectal cancer Neg Hx    Stomach cancer Neg Hx     Social History   Socioeconomic History   Marital status: Divorced    Spouse name: Not on file   Number of children: Not on file   Years of education: Not on file   Highest education level: Not on file  Occupational History    Comment: Assembler    Tobacco Use   Smoking status: Former    Types: Cigarettes   Smokeless tobacco: Never   Tobacco comments:    Smoked for 4 years, quit age 80  Vaping Use   Vaping Use: Never used  Substance and Sexual Activity   Alcohol use: Yes   Drug use: No   Sexual activity: Yes    Birth control/protection: None    Comment: Starteds menstral cycle today 12-12-2018 pt reported  Other Topics Concern   Not on file  Social History Narrative   Not on file   Social Determinants of Health   Financial Resource Strain: Not on file  Food Insecurity: Not on file  Transportation Needs: Not on file  Physical Activity: Not on file  Stress: Not on file  Social Connections: Not on file  Intimate Partner Violence: Not on file    Outpatient Medications Prior to Visit  Medication Sig Dispense Refill   aspirin EC 81 MG tablet Take 1 tablet (81 mg total) by mouth daily. 30 tablet 5   Blood Glucose Monitoring Suppl (TRUE METRIX METER) w/Device KIT USE AS DIRECTED (Patient taking differently: 1 each by Other route as directed.) 1 kit 0   dapagliflozin propanediol (FARXIGA) 10 MG TABS  tablet Take 1 tablet (10 mg total) by mouth daily before breakfast. 30 tablet 6   Dulaglutide (TRULICITY) 1.5 BW/6.2MB SOPN Inject 1.5 mg into the skin once a week. 2 mL 6   ferrous sulfate 325 (65 FE) MG tablet Take 1 tablet (325 mg total) by mouth 2 (two) times daily with a meal. 180 tablet 3   gabapentin (NEURONTIN) 300 MG capsule Take 2 capsules (600 mg total) by mouth 3 (three) times daily. 120 capsule 6   glucose blood (TRUE METRIX BLOOD GLUCOSE TEST) test strip Use as instructed (Patient taking differently: 1 each by Other route as directed.) 100 each 12   ibuprofen (ADVIL) 600 MG tablet Take 1 tablet (600 mg total) by mouth every 8 (eight) hours as needed. 10 tablet 0   insulin glargine (SEMGLEE, YFGN,) 100 UNIT/ML Solostar Pen Inject 40 Units into the skin 2 (two) times daily.     insulin lispro (HUMALOG KWIKPEN) 100  UNIT/ML KwikPen Use 0-12 units per sliding scale three times daily with meals. 15 mL 2   Insulin Pen Needle 32G X 4 MM MISC USE AS INSTRUCTED TO INJECT INSULIN AND VICTOZA DAILY. 100 each 0   Insulin Syringe-Needle U-100 31G X 5/16" 0.3 ML MISC USE TO INJECT INSULIN NOVOLOG TID. 100 each 0   lisinopril-hydrochlorothiazide (ZESTORETIC) 20-12.5 MG tablet TAKE 2 TABLETS BY MOUTH DAILY. 180 tablet 1   meloxicam (MOBIC) 15 MG tablet Take 1 tablet every day by oral route as needed. 30 tablet 0   metFORMIN (GLUCOPHAGE) 1000 MG tablet Take 1 tablet (1,000 mg total) by mouth 2 (two) times daily with a meal. 180 tablet 1   metoprolol succinate (TOPROL-XL) 100 MG 24 hr tablet Take 1 tablet (100 mg total) by mouth daily. Take with or immediately following a meal. 90 tablet 3   simvastatin (ZOCOR) 80 MG tablet Take 80 mg by mouth daily at 6 PM.     traZODone (DESYREL) 100 MG tablet Take 1 tablet (100 mg total) by mouth at bedtime as needed for sleep. 30 tablet 3   TRUEplus Lancets 28G MISC USE AS INSTRUCTED (Patient taking differently: 1 each by Other route as directed.) 100 each 5   atorvastatin (LIPITOR) 80 MG tablet Take 1 tablet (80 mg total) by mouth daily. 30 tablet 6   ondansetron (ZOFRAN ODT) 4 MG disintegrating tablet Take 1 tablet (4 mg total) by mouth every 8 (eight) hours as needed for nausea or vomiting. 20 tablet 0   ondansetron (ZOFRAN ODT) 4 MG disintegrating tablet Take 1 tablet (4 mg total) by mouth every 8 (eight) hours as needed for nausea or vomiting. 20 tablet 0   promethazine (PHENERGAN) 25 MG tablet Take 1 tablet (25 mg total) by mouth every 6 (six) hours as needed for nausea or vomiting. 10 tablet 0   Clotrimazole 1 % OINT Apply 1 each topically 2 (two) times daily. (Patient not taking: Reported on 03/17/2021) 56.7 g 0   etodolac (LODINE) 300 MG capsule Take 1 capsule (300 mg total) by mouth every 8 (eight) hours. (Patient not taking: Reported on 03/17/2021) 21 capsule 0   insulin  glargine-yfgn (SEMGLEE, YFGN,) 100 UNIT/ML Pen Inject 40 Units into the skin 2 (two) times daily. (Patient not taking: Reported on 03/17/2021) 30 mL 3   metroNIDAZOLE (METROGEL) 0.75 % vaginal gel PLACE 1 APPLICATORFUL VAGINALLY AT BEDTIME. (Patient not taking: Reported on 03/17/2021) 70 g 0   predniSONE (DELTASONE) 20 MG tablet Take 1 tablet (20 mg total) by  mouth daily with breakfast. (Patient not taking: Reported on 03/17/2021) 5 tablet 0   sucralfate (CARAFATE) 1 g tablet Take 1 tablet (1 g total) by mouth 4 (four) times daily -  with meals and at bedtime. (Patient not taking: Reported on 03/17/2021) 60 tablet 0   No facility-administered medications prior to visit.    Allergies  Allergen Reactions   Tomato Itching and Rash    Review of Systems  Constitutional:  Positive for fatigue. Negative for fever.  HENT:  Positive for congestion, facial swelling, nosebleeds, postnasal drip, rhinorrhea, sinus pressure, sinus pain, sneezing, sore throat, tinnitus, trouble swallowing and voice change. Negative for dental problem, drooling, ear discharge, ear pain, hearing loss and mouth sores.   Eyes: Negative.  Negative for photophobia, pain, redness, itching and visual disturbance.  Respiratory:  Positive for cough, shortness of breath and wheezing. Negative for chest tightness.        Dry cough  Cardiovascular:  Negative for palpitations and leg swelling.  Gastrointestinal: Negative.   Endocrine: Negative.   Genitourinary: Negative.   Musculoskeletal: Negative.   Skin: Negative.   Allergic/Immunologic: Positive for environmental allergies and food allergies.       Tomato allergy Worse in spring  Neurological:  Positive for dizziness, light-headedness and headaches.  Psychiatric/Behavioral:  Positive for dysphoric mood. Negative for sleep disturbance and suicidal ideas. The patient is not nervous/anxious.        Daughter lost father suddenly      Objective:    Physical Exam Vitals reviewed.   Constitutional:      Appearance: Normal appearance. She is well-developed. She is obese. She is not diaphoretic.  HENT:     Head: Normocephalic and atraumatic.     Nose: Nasal tenderness, mucosal edema, congestion and rhinorrhea present. No nasal deformity, septal deviation, signs of injury or laceration. Rhinorrhea is purulent.     Right Nostril: Occlusion present. No foreign body, epistaxis or septal hematoma.     Left Nostril: Occlusion present. No foreign body, epistaxis or septal hematoma.     Right Turbinates: Enlarged, swollen and pale.     Left Turbinates: Enlarged, swollen and pale.     Right Sinus: Maxillary sinus tenderness and frontal sinus tenderness present.     Left Sinus: Maxillary sinus tenderness and frontal sinus tenderness present.     Mouth/Throat:     Pharynx: No oropharyngeal exudate.  Eyes:     General: No scleral icterus.    Conjunctiva/sclera: Conjunctivae normal.     Pupils: Pupils are equal, round, and reactive to light.  Neck:     Thyroid: No thyromegaly.     Vascular: No carotid bruit or JVD.     Trachea: Trachea normal. No tracheal tenderness or tracheal deviation.  Cardiovascular:     Rate and Rhythm: Normal rate and regular rhythm.     Chest Wall: PMI is not displaced.     Pulses: Normal pulses. No decreased pulses.     Heart sounds: Normal heart sounds, S1 normal and S2 normal. Heart sounds not distant. No murmur heard. No systolic murmur is present.  No diastolic murmur is present.    No friction rub. No gallop. No S3 or S4 sounds.  Pulmonary:     Effort: Pulmonary effort is normal. No tachypnea, accessory muscle usage or respiratory distress.     Breath sounds: Normal breath sounds. No stridor. No decreased breath sounds, wheezing, rhonchi or rales.  Chest:     Chest wall: No tenderness.  Abdominal:  General: Bowel sounds are normal. There is no distension.     Palpations: Abdomen is soft. Abdomen is not rigid.     Tenderness: There is no  abdominal tenderness. There is no guarding or rebound.  Musculoskeletal:        General: Normal range of motion.     Cervical back: Normal range of motion and neck supple. No edema, erythema or rigidity. No muscular tenderness. Normal range of motion.     Comments: Foot exam is normal see quality metrics  Lymphadenopathy:     Head:     Right side of head: No submental or submandibular adenopathy.     Left side of head: No submental or submandibular adenopathy.     Cervical: No cervical adenopathy.  Skin:    General: Skin is warm and dry.     Coloration: Skin is not pale.     Findings: No rash.     Nails: There is no clubbing.  Neurological:     Mental Status: She is alert and oriented to person, place, and time.     Sensory: No sensory deficit.  Psychiatric:        Mood and Affect: Mood normal.        Speech: Speech normal.        Behavior: Behavior normal.        Thought Content: Thought content normal.        Judgment: Judgment normal.    BP (!) 145/85    Pulse 80    Ht 5\' 5"  (1.651 m)    Wt 211 lb 3.2 oz (95.8 kg)    SpO2 100%    BMI 35.15 kg/m  Wt Readings from Last 3 Encounters:  03/17/21 211 lb 3.2 oz (95.8 kg)  12/14/20 209 lb 12.8 oz (95.2 kg)  11/27/20 213 lb (96.6 kg)    Health Maintenance Due  Topic Date Due   COVID-19 Vaccine (1) Never done   OPHTHALMOLOGY EXAM  Never done   Pneumococcal Vaccine 17-55 Years old (2 - PCV) 03/18/2015    There are no preventive care reminders to display for this patient.   Lab Results  Component Value Date   TSH 2.52 05/25/2016   Lab Results  Component Value Date   WBC 8.0 11/03/2020   HGB 12.3 11/03/2020   HCT 37.8 11/03/2020   MCV 90.9 11/03/2020   PLT 417 (H) 11/03/2020   Lab Results  Component Value Date   NA 133 (L) 11/03/2020   K 3.7 11/03/2020   CO2 25 11/03/2020   GLUCOSE 322 (H) 11/03/2020   BUN 23 (H) 11/03/2020   CREATININE 1.35 (H) 11/03/2020   BILITOT 0.7 11/03/2020   ALKPHOS 73 11/03/2020   AST  19 11/03/2020   ALT 14 11/03/2020   PROT 7.1 11/03/2020   ALBUMIN 3.6 11/03/2020   CALCIUM 8.8 (L) 11/03/2020   ANIONGAP 10 11/03/2020   EGFR 79 10/28/2020   Lab Results  Component Value Date   CHOL 173 10/28/2020   Lab Results  Component Value Date   HDL 40 10/28/2020   Lab Results  Component Value Date   LDLCALC 112 (H) 10/28/2020   Lab Results  Component Value Date   TRIG 116 10/28/2020   Lab Results  Component Value Date   CHOLHDL 4.3 10/28/2020   Lab Results  Component Value Date   HGBA1C 8.6 (A) 03/17/2021       Assessment & Plan:   Problem List Items Addressed This Visit  Cardiovascular and Mediastinum   Essential hypertension    Blood pressure mildly elevated today we will continue current medications and observe  We will have the patient come back to see clinical pharmacy in 3 weeks for both diabetes and hypertension      Relevant Medications   simvastatin (ZOCOR) 80 MG tablet     Respiratory   Acute recurrent maxillary sinusitis - Primary    Acute maxillary sinusitis likely present given findings  Begin cefdinir 300 mg twice daily for 7 days  Avoid systemic steroids due to diabetes  Begin topical fluticasone 2 sprays each nostril daily  Use NeilMed sinus rinse twice daily for 5 days as needed thereafter  May need allergy evaluation at a later date      Relevant Medications   cefdinir (OMNICEF) 300 MG capsule   fluticasone (FLONASE) 50 MCG/ACT nasal spray     Endocrine   Diabetic neuropathy, painful (HCC)    Controlled with gabapentin      Relevant Medications   simvastatin (ZOCOR) 80 MG tablet   insulin glargine (SEMGLEE, YFGN,) 100 UNIT/ML Solostar Pen   Uncontrolled type 2 diabetes mellitus with hyperglycemia, with long-term current use of insulin (HCC)    A1c elevated 8.8 patient on a significant program of short and long-acting insulin, Farxiga, Trulicity, metformin  Diabetes currently not controlled  I went over with  the patient a healthy diet to try to improve lifestyle management and did not make medication changes  Patient to return to see [facility] clinical pharmacy 3 weeks      Relevant Medications   simvastatin (ZOCOR) 80 MG tablet   insulin glargine (SEMGLEE, YFGN,) 100 UNIT/ML Solostar Pen     Other   Anxiety and depression    Patient with difficulty over adjustment of her ex-husband's sudden death in 02-07-23 also her child is dealing with this as well I offered services of our clinical social work she will take this under advisement        Meds ordered this encounter  Medications   cefdinir (OMNICEF) 300 MG capsule    Sig: Take 1 capsule (300 mg total) by mouth 2 (two) times daily for 7 days.    Dispense:  14 capsule    Refill:  0   fluticasone (FLONASE) 50 MCG/ACT nasal spray    Sig: Place 2 sprays into both nostrils daily.    Dispense:  16 g    Refill:  6   38 minutes spent obtaining information reviewing chart performing history and physical complex decision making multiple problem review and assessments  Asencion Noble, MD

## 2021-03-17 NOTE — Assessment & Plan Note (Signed)
Patient with difficulty over adjustment of her ex-husband's sudden death in 02-02-2023 also her child is dealing with this as well I offered services of our clinical social work she will take this under advisement

## 2021-03-17 NOTE — Assessment & Plan Note (Signed)
Controlled with gabapentin

## 2021-03-18 ENCOUNTER — Ambulatory Visit: Payer: Managed Care, Other (non HMO) | Admitting: Cardiovascular Disease

## 2021-03-19 ENCOUNTER — Other Ambulatory Visit: Payer: Self-pay

## 2021-03-19 ENCOUNTER — Other Ambulatory Visit (HOSPITAL_COMMUNITY): Payer: Self-pay

## 2021-03-24 ENCOUNTER — Other Ambulatory Visit: Payer: Self-pay | Admitting: Family Medicine

## 2021-03-24 NOTE — Telephone Encounter (Signed)
Requested medications are due for refill today.  yes  Requested medications are on the active medications list.  yes  Last refill. 04/21/2020  Future visit scheduled.   yes  Notes to clinic.  Rx was changed. Unsure if new rx is needed.     Requested Prescriptions  Pending Prescriptions Disp Refills   Insulin Pen Needle (TRUEPLUS PEN NEEDLES) 32G X 4 MM MISC 100 each 0    Sig: USE AS INSTRUCTED TO INJECT INSULIN AND VICTOZA DAILY.     Endocrinology: Diabetes - Testing Supplies Passed - 03/24/2021  9:01 PM      Passed - Valid encounter within last 12 months    Recent Outpatient Visits           1 week ago Acute recurrent maxillary sinusitis   [facility] Storm Frisk, [facility]   3 months ago Viral gastroenteritis   [facility] [place], Odette Horns, MD   4 months ago Type 2 diabetes mellitus with diabetic polyneuropathy, with long-term current use of insulin (HCC)   [facility] Hoy Register, MD   7 months ago Encounter for examination following treatment at hospital   [facility] [place], Marylene Land M, [place]   11 months ago Annual physical exam   [facility] Hoy Register, MD       Future Appointments             In 2 weeks Lois Huxley, Cornelius Moras, RPH-CPP [facility]   In 1 month Hoy Register, MD [facility]   In 3 months Lennette Bihari, MD [facility] [place], [place]

## 2021-03-25 ENCOUNTER — Other Ambulatory Visit: Payer: Self-pay

## 2021-03-25 ENCOUNTER — Other Ambulatory Visit (HOSPITAL_COMMUNITY): Payer: Self-pay

## 2021-03-25 MED ORDER — UNIFINE PENTIPS 32G X 4 MM MISC
0 refills | Status: AC
  Filled 2021-03-25: qty 100, 50d supply, fill #0

## 2021-03-29 ENCOUNTER — Other Ambulatory Visit (HOSPITAL_COMMUNITY): Payer: Self-pay

## 2021-03-29 ENCOUNTER — Other Ambulatory Visit: Payer: Self-pay

## 2021-03-30 ENCOUNTER — Other Ambulatory Visit: Payer: Self-pay

## 2021-04-08 ENCOUNTER — Ambulatory Visit: Payer: Managed Care, Other (non HMO) | Admitting: Pharmacist

## 2021-04-16 ENCOUNTER — Ambulatory Visit: Payer: Managed Care, Other (non HMO) | Admitting: Pharmacist

## 2021-04-26 ENCOUNTER — Other Ambulatory Visit: Payer: Self-pay | Admitting: Family Medicine

## 2021-04-26 ENCOUNTER — Other Ambulatory Visit: Payer: Self-pay

## 2021-04-26 DIAGNOSIS — I10 Essential (primary) hypertension: Secondary | ICD-10-CM

## 2021-04-26 MED ORDER — LISINOPRIL-HYDROCHLOROTHIAZIDE 20-12.5 MG PO TABS
2.0000 | ORAL_TABLET | Freq: Every day | ORAL | 0 refills | Status: DC
  Filled 2021-04-26: qty 60, 30d supply, fill #0
  Filled 2021-04-27: qty 180, 90d supply, fill #0

## 2021-04-27 ENCOUNTER — Other Ambulatory Visit: Payer: Self-pay

## 2021-04-27 ENCOUNTER — Other Ambulatory Visit (HOSPITAL_COMMUNITY): Payer: Self-pay

## 2021-04-30 ENCOUNTER — Other Ambulatory Visit: Payer: Self-pay | Admitting: Family Medicine

## 2021-04-30 ENCOUNTER — Other Ambulatory Visit (HOSPITAL_COMMUNITY): Payer: Self-pay

## 2021-04-30 DIAGNOSIS — Z794 Long term (current) use of insulin: Secondary | ICD-10-CM

## 2021-04-30 DIAGNOSIS — E1142 Type 2 diabetes mellitus with diabetic polyneuropathy: Secondary | ICD-10-CM

## 2021-04-30 MED ORDER — INSULIN GLARGINE-YFGN 100 UNIT/ML ~~LOC~~ SOPN
40.0000 [IU] | PEN_INJECTOR | Freq: Two times a day (BID) | SUBCUTANEOUS | 3 refills | Status: DC
  Filled 2021-04-30: qty 30, 37d supply, fill #0
  Filled 2021-07-19: qty 27, 33d supply, fill #1

## 2021-04-30 NOTE — Telephone Encounter (Signed)
Requested medications are due for refill today.  no  Requested medications are on the active medications list.  yes  Last refill. 10/28/2020  Future visit scheduled.   yes  Notes to clinic.  This medication was discontinued on 03/17/2021. Listed as historical medication on med list.    Requested Prescriptions  Pending Prescriptions Disp Refills   insulin glargine-yfgn (SEMGLEE, YFGN,) 100 UNIT/ML Pen 30 mL 3    Sig: Inject 40 Units into the skin 2 (two) times daily.     Off-Protocol Failed - 04/30/2021  1:00 PM      Failed - Medication not assigned to a protocol, review manually.      Passed - Valid encounter within last 12 months    Recent Outpatient Visits           1 month ago Acute recurrent maxillary sinusitis   [facility] Storm Frisk, [place]   4 months ago Viral gastroenteritis   [facility] [place], Odette Horns, MD   6 months ago Type 2 diabetes mellitus with diabetic polyneuropathy, with long-term current use of insulin (HCC)   [facility] Hoy Register, MD   8 months ago Encounter for examination following treatment at hospital   [facility] [place], Marzella Schlein, [place]   1 year ago Annual physical exam   [facility] Hoy Register, MD       Future Appointments             In 1 week Hoy Register, MD [facility]   In 2 months Lennette Bihari, MD [facility] [place], [place]

## 2021-05-03 ENCOUNTER — Other Ambulatory Visit (HOSPITAL_COMMUNITY): Payer: Self-pay

## 2021-05-07 ENCOUNTER — Other Ambulatory Visit: Payer: Self-pay

## 2021-05-07 ENCOUNTER — Other Ambulatory Visit: Payer: Self-pay | Admitting: Family Medicine

## 2021-05-07 NOTE — Telephone Encounter (Signed)
Requested medication (s) are due for refill today: yes ? ?Requested medication (s) are on the active medication list: no ? ?Last refill:  02/08/21  ? ?Future visit scheduled: yes ? ?Notes to clinic:  rx was dc'd at 03/17/21. Please advise ? ? ?  ?Requested Prescriptions  ?Pending Prescriptions Disp Refills  ? atorvastatin (LIPITOR) 80 MG tablet 30 tablet 6  ?  Sig: Take 1 tablet (80 mg total) by mouth daily.  ?  ? Cardiovascular:  Antilipid - Statins Failed - 05/07/2021  4:50 PM  ?  ?  Failed - Lipid Panel in normal range within the last 12 months  ?  Cholesterol, Total  ?Date Value Ref Range Status  ?10/28/2020 173 100 - 199 mg/dL Final  ? ?LDL Chol Calc (NIH)  ?Date Value Ref Range Status  ?10/28/2020 112 (H) 0 - 99 mg/dL Final  ? ?HDL  ?Date Value Ref Range Status  ?10/28/2020 40 >39 mg/dL Final  ? ?Triglycerides  ?Date Value Ref Range Status  ?10/28/2020 116 0 - 149 mg/dL Final  ? ?  ?  ?  Passed - Patient is not pregnant  ?  ?  Passed - Valid encounter within last 12 months  ?  Recent Outpatient Visits   ? ?      ? 1 month ago Acute recurrent maxillary sinusitis  ? [facility] Storm Frisk, [place]  ? 4 months ago Viral gastroenteritis  ? [facility] [place], Odette Horns, MD  ? 6 months ago Type 2 diabetes mellitus with diabetic polyneuropathy, with long-term current use of insulin (HCC)  ? [facility] Hoy Register, MD  ? 8 months ago Encounter for examination following treatment at hospital  ? [facility] [place], Marzella Schlein, [place]  ? 1 year ago Annual physical exam  ? [facility] Hoy Register, MD  ? ?  ?  ?Future Appointments   ? ?        ? In 3 days Hoy Register, MD [facility]  ? In 2 months Lennette Bihari, MD [facility]  ? ?  ? ?  ?  ?  ? ?

## 2021-05-10 ENCOUNTER — Other Ambulatory Visit: Payer: Self-pay

## 2021-05-10 ENCOUNTER — Ambulatory Visit: Payer: Managed Care, Other (non HMO) | Attending: Family Medicine | Admitting: Family Medicine

## 2021-05-10 ENCOUNTER — Other Ambulatory Visit (HOSPITAL_COMMUNITY): Payer: Self-pay

## 2021-05-10 VITALS — BP 131/80 | HR 79 | Ht 65.0 in | Wt 214.6 lb

## 2021-05-10 DIAGNOSIS — I152 Hypertension secondary to endocrine disorders: Secondary | ICD-10-CM

## 2021-05-10 DIAGNOSIS — Z029 Encounter for administrative examinations, unspecified: Secondary | ICD-10-CM | POA: Diagnosis not present

## 2021-05-10 DIAGNOSIS — Z794 Long term (current) use of insulin: Secondary | ICD-10-CM | POA: Diagnosis not present

## 2021-05-10 DIAGNOSIS — E668 Other obesity: Secondary | ICD-10-CM

## 2021-05-10 DIAGNOSIS — E1142 Type 2 diabetes mellitus with diabetic polyneuropathy: Secondary | ICD-10-CM

## 2021-05-10 DIAGNOSIS — Z6835 Body mass index (BMI) 35.0-35.9, adult: Secondary | ICD-10-CM

## 2021-05-10 DIAGNOSIS — F331 Major depressive disorder, recurrent, moderate: Secondary | ICD-10-CM | POA: Insufficient documentation

## 2021-05-10 DIAGNOSIS — E1159 Type 2 diabetes mellitus with other circulatory complications: Secondary | ICD-10-CM | POA: Diagnosis not present

## 2021-05-10 LAB — GLUCOSE, POCT (MANUAL RESULT ENTRY): POC Glucose: 218 mg/dl — AB (ref 70–99)

## 2021-05-10 MED ORDER — LISINOPRIL-HYDROCHLOROTHIAZIDE 20-12.5 MG PO TABS
2.0000 | ORAL_TABLET | Freq: Every day | ORAL | 1 refills | Status: DC
  Filled 2021-05-10: qty 180, 90d supply, fill #0
  Filled 2021-08-19: qty 60, 30d supply, fill #0
  Filled 2022-03-07: qty 60, 30d supply, fill #1
  Filled 2022-04-11: qty 60, 30d supply, fill #2

## 2021-05-10 MED ORDER — ATORVASTATIN CALCIUM 80 MG PO TABS
80.0000 mg | ORAL_TABLET | Freq: Every day | ORAL | 1 refills | Status: DC
  Filled 2021-05-10: qty 30, 30d supply, fill #0

## 2021-05-10 MED ORDER — TRULICITY 3 MG/0.5ML ~~LOC~~ SOAJ
3.0000 mg | SUBCUTANEOUS | 6 refills | Status: DC
  Filled 2021-05-10 – 2021-05-25 (×5): qty 2, 28d supply, fill #0
  Filled 2021-06-24: qty 2, 28d supply, fill #1

## 2021-05-10 MED ORDER — INSULIN LISPRO (1 UNIT DIAL) 100 UNIT/ML (KWIKPEN)
PEN_INJECTOR | SUBCUTANEOUS | 2 refills | Status: DC
Start: 2021-05-10 — End: 2021-11-25
  Filled 2021-05-10: qty 15, 28d supply, fill #0

## 2021-05-10 NOTE — Progress Notes (Signed)
No concerns ?Has FMLA paperwork. ?

## 2021-05-10 NOTE — Progress Notes (Signed)
Subjective:  Patient ID: Mariah Miller, female    DOB: 03-21-1975  Age: 46 y.o. MRN: [id number]  CC: Diabetes   HPI Mariah Miller is a 46 y.o. year old female with a history of type 2 diabetes mellitus (A1c 8.6), hypertension, depression. She had a visit for chronic disease management 2 months ago and presents today with an FMLA paperwork  Interval History: She needs FMLA paperwork completed to cover her when she is out to take so she does not lose her job.  Last year she did have a couple of ED visits. Her A1c is 8.6 and she endorses compliance with her medications.  Depression is controlled and she is doing well on her antihypertensives. Currently under orthopedic care for her left knee pain and has received steroid injections with no much improvement. Past Medical History:  Diagnosis Date   Depression    Diabetes mellitus without complication (HCC)    Hypertension    Migraine    Seizures (HCC)    self reported - last seizure was Oct 2019 "absent seizure"    Past Surgical History:  Procedure Laterality Date   CHOLECYSTECTOMY     CYST EXCISION     SHOULDER OPEN ROTATOR CUFF REPAIR Left    TONSILLECTOMY     TUBAL LIGATION      Family History  Problem Relation Age of Onset   Hypertension Mother    Thyroid disease Mother    Irregular heart beat Mother    Bipolar disorder Sister    Colon cancer Other    Esophageal cancer Neg Hx    Rectal cancer Neg Hx    Stomach cancer Neg Hx     Allergies  Allergen Reactions   Tomato Itching and Rash    Outpatient Medications Prior to Visit  Medication Sig Dispense Refill   aspirin EC 81 MG tablet Take 1 tablet (81 mg total) by mouth daily. 30 tablet 5   Blood Glucose Monitoring Suppl (TRUE METRIX METER) w/Device KIT USE AS DIRECTED (Patient taking differently: 1 each by Other route as directed.) 1 kit 0   dapagliflozin propanediol (FARXIGA) 10 MG TABS tablet Take 1 tablet (10 mg total) by mouth daily before breakfast.  30 tablet 6   Dulaglutide (TRULICITY) 1.5 MG/0.5ML SOPN Inject 1.5 mg into the skin once a week. 2 mL 6   gabapentin (NEURONTIN) 300 MG capsule Take 2 capsules (600 mg total) by mouth 3 (three) times daily. 120 capsule 6   glucose blood (TRUE METRIX BLOOD GLUCOSE TEST) test strip Use as instructed (Patient taking differently: 1 each by Other route as directed.) 100 each 12   insulin glargine-yfgn (SEMGLEE, YFGN,) 100 UNIT/ML Pen Inject 40 Units into the skin 2 (two) times daily. 30 mL 3   metFORMIN (GLUCOPHAGE) 1000 MG tablet Take 1 tablet by mouth 2 (two) times daily with a meal. 180 tablet 1   traZODone (DESYREL) 100 MG tablet Take 1 tablet (100 mg total) by mouth at bedtime as needed for sleep. 30 tablet 3   TRUEplus Lancets 28G MISC USE AS INSTRUCTED (Patient taking differently: 1 each by Other route as directed.) 100 each 5   atorvastatin (LIPITOR) 80 MG tablet Take 80 mg by mouth daily.     insulin lispro (HUMALOG KWIKPEN) 100 UNIT/ML KwikPen Use 0-12 units per sliding scale three times daily with meals. 15 mL 2   lisinopril-hydrochlorothiazide (ZESTORETIC) 20-12.5 MG tablet TAKE 2 TABLETS BY MOUTH DAILY. 180 tablet 0   ferrous sulfate  325 (65 FE) MG tablet Take 1 tablet (325 mg total) by mouth 2 (two) times daily with a meal. (Patient not taking: Reported on 05/10/2021) 180 tablet 3   fluticasone (FLONASE) 50 MCG/ACT nasal spray Place 2 sprays into both nostrils daily. (Patient not taking: Reported on 05/10/2021) 16 g 6   ibuprofen (ADVIL) 600 MG tablet Take 1 tablet (600 mg total) by mouth every 8 (eight) hours as needed. (Patient not taking: Reported on 05/10/2021) 10 tablet 0   Insulin Pen Needle (UNIFINE PENTIPS) 32G X 4 MM MISC Use as directed with insulin and victoza (Patient not taking: Reported on 05/10/2021) 100 each 0   meloxicam (MOBIC) 15 MG tablet Take 1 tablet every day by oral route as needed. (Patient not taking: Reported on 05/10/2021) 30 tablet 0   metoprolol succinate (TOPROL-XL) 100  MG 24 hr tablet Take 1 tablet by mouth daily. Take with or immediately following a meal. (Patient not taking: Reported on 05/10/2021) 90 tablet 3   simvastatin (ZOCOR) 80 MG tablet Take 80 mg by mouth daily at 6 PM. (Patient not taking: Reported on 05/10/2021)     No facility-administered medications prior to visit.     ROS Review of Systems  Constitutional:  Negative for activity change, appetite change and fatigue.  HENT:  Negative for congestion, sinus pressure and sore throat.   Eyes:  Negative for visual disturbance.  Respiratory:  Negative for cough, chest tightness, shortness of breath and wheezing.   Cardiovascular:  Negative for chest pain and palpitations.  Gastrointestinal:  Negative for abdominal distention, abdominal pain and constipation.  Endocrine: Negative for polydipsia.  Genitourinary:  Negative for dysuria and frequency.  Musculoskeletal:        See HPI  Skin:  Negative for rash.  Neurological:  Negative for tremors, light-headedness and numbness.  Hematological:  Does not bruise/bleed easily.  Psychiatric/Behavioral:  Negative for agitation and behavioral problems.    Objective:  BP 131/80    Pulse 79    Ht $R'5\' 5"'FI$  (1.651 m)    Wt 214 lb 9.6 oz (97.3 kg)    SpO2 100%    BMI 35.71 kg/m   BP/Weight 05/10/2021 03/17/2021 56/43/3295  Systolic BP 188 416 606  Diastolic BP 80 85 78  Wt. (Lbs) 214.6 211.2 209.8  BMI 35.71 35.15 34.91      Physical Exam Constitutional:      Appearance: She is well-developed.  Cardiovascular:     Rate and Rhythm: Normal rate.     Heart sounds: Normal heart sounds. No murmur heard. Pulmonary:     Effort: Pulmonary effort is normal.     Breath sounds: Normal breath sounds. No wheezing or rales.  Chest:     Chest wall: No tenderness.  Abdominal:     General: Bowel sounds are normal. There is no distension.     Palpations: Abdomen is soft. There is no mass.     Tenderness: There is no abdominal tenderness.  Musculoskeletal:         General: Normal range of motion.     Right lower leg: No edema.     Left lower leg: No edema.     Comments: Slight tenderness on range of motion of left knee  Neurological:     Mental Status: She is alert and oriented to person, place, and time.  Psychiatric:        Mood and Affect: Mood normal.    CMP Latest Ref Rng & Units 11/03/2020 10/28/2020 10/12/2020  Glucose 70 - 99 mg/dL 419(Q) 222(L) 798(X)  BUN 6 - 20 mg/dL 21(J) 13 19  Creatinine 0.44 - 1.00 mg/dL 9.41(D) 4.08 1.44(Y)  Sodium 135 - 145 mmol/L 133(L) 138 133(L)  Potassium 3.5 - 5.1 mmol/L 3.7 4.8 4.1  Chloride 98 - 111 mmol/L 98 102 101  CO2 22 - 32 mmol/L 25 22 26   Calcium 8.9 - 10.3 mg/dL 1.8(H) 9.2 8.9  Total Protein 6.5 - 8.1 g/dL 7.1 - -  Total Bilirubin 0.3 - 1.2 mg/dL 0.7 - -  Alkaline Phos 38 - 126 U/L 73 - -  AST 15 - 41 U/L 19 - -  ALT 0 - 44 U/L 14 - -    Lipid Panel     Component Value Date/Time   CHOL 173 10/28/2020 0956   TRIG 116 10/28/2020 0956   HDL 40 10/28/2020 0956   CHOLHDL 4.3 10/28/2020 0956   CHOLHDL 4.7 05/25/2016 1543   VLDL 37 (H) 05/25/2016 1543   LDLCALC 112 (H) 10/28/2020 0956    CBC    Component Value Date/Time   WBC 8.0 11/03/2020 0648   RBC 4.16 11/03/2020 0648   HGB 12.3 11/03/2020 0648   HCT 37.8 11/03/2020 0648   PLT 417 (H) 11/03/2020 0648   MCV 90.9 11/03/2020 0648   MCH 29.6 11/03/2020 0648   MCHC 32.5 11/03/2020 0648   RDW 13.3 11/03/2020 0648   LYMPHSABS 1.9 10/01/2020 0835   MONOABS 0.5 10/01/2020 0835   EOSABS 0.1 10/01/2020 0835   BASOSABS 0.1 10/01/2020 0835    Lab Results  Component Value Date   HGBA1C 8.6 (A) 03/17/2021    Assessment & Plan:  1. Type 2 diabetes mellitus with diabetic polyneuropathy, with long-term current use of insulin (HCC) Uncontrolled with A1c of 8.6; goal is less than 7.0 Trulicity dose increased Counseled on Diabetic diet, my plate method, 631 minutes of moderate intensity exercise/week Blood sugar logs with fasting goals of  80-120 mg/dl, random of less than 497 and in the event of sugars less than 60 mg/dl or greater than 026 mg/dl encouraged to notify the clinic. Advised on the need for annual eye exams, annual foot exams, Pneumonia vaccine.  - POCT glucose (manual entry) - Dulaglutide (TRULICITY) 3 MG/0.5ML SOPN; Inject 3 mg as directed once a week.  Dispense: 2 mL; Refill: 6 - Microalbumin / creatinine urine ratio; Future - LP+Non-HDL Cholesterol; Future - CMP14+EGFR; Future - atorvastatin (LIPITOR) 80 MG tablet; Take 1 tablet (80 mg total) by mouth daily.  Dispense: 90 tablet; Refill: 1 - insulin lispro (HUMALOG KWIKPEN) 100 UNIT/ML KwikPen; Use 0-12 units per sliding scale three times daily with meals.  Dispense: 15 mL; Refill: 2  2. Moderate obesity Counseled on 150 minutes of exercise per week, healthy eating (including decreased daily intake of saturated fats, cholesterol, added sugars, sodium), STI prevention, routine healthcare maintenance.   3. Hypertension associated with diabetes (HCC) Controlled Counseled on blood pressure goal of less than 130/80, low-sodium, DASH diet, medication compliance, 150 minutes of moderate intensity exercise per week. Discussed medication compliance, adverse effects. - lisinopril-hydrochlorothiazide (ZESTORETIC) 20-12.5 MG tablet; TAKE 2 TABLETS BY MOUTH DAILY.  Dispense: 180 tablet; Refill: 1  4. Administrative encounter FMLA paperwork completed   Meds ordered this encounter  Medications   Dulaglutide (TRULICITY) 3 MG/0.5ML SOPN    Sig: Inject 3 mg as directed once a week.    Dispense:  2 mL    Refill:  6    Dose increased   atorvastatin (  LIPITOR) 80 MG tablet    Sig: Take 1 tablet (80 mg total) by mouth daily.    Dispense:  90 tablet    Refill:  1   insulin lispro (HUMALOG KWIKPEN) 100 UNIT/ML KwikPen    Sig: Use 0-12 units per sliding scale three times daily with meals.    Dispense:  15 mL    Refill:  2   lisinopril-hydrochlorothiazide (ZESTORETIC)  20-12.5 MG tablet    Sig: TAKE 2 TABLETS BY MOUTH DAILY.    Dispense:  180 tablet    Refill:  1    Follow-up: Return in about 3 months (around 08/10/2021) for Chronic medical conditions.       Charlott Rakes, MD, FAAFP. [facility] [place], [place]   05/11/2021, 1:20 PM

## 2021-05-11 ENCOUNTER — Other Ambulatory Visit (HOSPITAL_COMMUNITY): Payer: Self-pay

## 2021-05-11 ENCOUNTER — Encounter: Payer: Self-pay | Admitting: Family Medicine

## 2021-05-11 ENCOUNTER — Other Ambulatory Visit: Payer: Self-pay

## 2021-05-12 ENCOUNTER — Telehealth: Payer: Self-pay

## 2021-05-12 ENCOUNTER — Other Ambulatory Visit (HOSPITAL_COMMUNITY): Payer: Self-pay

## 2021-05-12 ENCOUNTER — Other Ambulatory Visit: Payer: Self-pay

## 2021-05-12 NOTE — Telephone Encounter (Signed)
PA for Trulicity 3mg  approved until 05/12/2022 ?

## 2021-05-25 ENCOUNTER — Other Ambulatory Visit (HOSPITAL_COMMUNITY): Payer: Self-pay

## 2021-06-24 ENCOUNTER — Other Ambulatory Visit (HOSPITAL_COMMUNITY): Payer: Self-pay

## 2021-06-24 ENCOUNTER — Other Ambulatory Visit: Payer: Self-pay | Admitting: Family Medicine

## 2021-06-24 ENCOUNTER — Other Ambulatory Visit: Payer: Self-pay

## 2021-06-24 DIAGNOSIS — E1142 Type 2 diabetes mellitus with diabetic polyneuropathy: Secondary | ICD-10-CM

## 2021-06-24 MED ORDER — TRULICITY 3 MG/0.5ML ~~LOC~~ SOAJ
3.0000 mg | SUBCUTANEOUS | 1 refills | Status: DC
  Filled 2021-06-24: qty 2, 28d supply, fill #0
  Filled 2021-07-05 – 2021-08-19 (×4): qty 2, 28d supply, fill #1
  Filled 2021-09-22: qty 2, 28d supply, fill #2
  Filled 2021-12-23: qty 2, 28d supply, fill #3
  Filled 2022-03-07: qty 2, 28d supply, fill #4
  Filled 2022-04-11 – 2022-05-22 (×3): qty 2, 28d supply, fill #5

## 2021-06-29 ENCOUNTER — Other Ambulatory Visit: Payer: Self-pay

## 2021-06-30 ENCOUNTER — Other Ambulatory Visit: Payer: Self-pay

## 2021-07-05 ENCOUNTER — Other Ambulatory Visit: Payer: Self-pay

## 2021-07-06 ENCOUNTER — Other Ambulatory Visit: Payer: Self-pay

## 2021-07-07 ENCOUNTER — Other Ambulatory Visit: Payer: Self-pay

## 2021-07-08 ENCOUNTER — Other Ambulatory Visit: Payer: Self-pay

## 2021-07-08 ENCOUNTER — Other Ambulatory Visit (HOSPITAL_COMMUNITY): Payer: Self-pay

## 2021-07-14 ENCOUNTER — Ambulatory Visit: Payer: Managed Care, Other (non HMO) | Admitting: Cardiovascular Disease

## 2021-07-19 ENCOUNTER — Other Ambulatory Visit: Payer: Self-pay

## 2021-07-20 ENCOUNTER — Other Ambulatory Visit: Payer: Self-pay

## 2021-07-22 ENCOUNTER — Other Ambulatory Visit: Payer: Self-pay

## 2021-08-04 ENCOUNTER — Ambulatory Visit: Payer: Self-pay | Admitting: *Deleted

## 2021-08-04 NOTE — Telephone Encounter (Signed)
Summary: advice - sugar levels   Pt called in with some questions about her blood sugar levels and what she needs to do if they are low / if she should increase her dose, pt needed advice.        Chief Complaint: hypoglycemic this am. At 2 am blood glucose 36. Woke up from a sleep sweating and nervous.  Symptoms: none now . At work felt like nervous, sweating, tired, urinary frequency Frequency: today  Pertinent Negatives: Patient denies difficulty breathing now  Disposition: [] ED /[] Urgent Care (no appt availability in office) / [] Appointment(In office/virtual)/ []  [place] [facility]/ [x] Home Care/ [] Refused Recommended Disposition /[]  Mobile Bus/ [x]  Follow-up with PCP Additional Notes:   No available appt until her appt 08/10/21. Please advise if changes need to be made to insulin or  oral medication .        Reason for Disposition  [1] Blood glucose < 70 mg/dL (3.9 mmol/L) or symptomatic AND [2] cause known  Answer Assessment - Initial Assessment Questions 1. SYMPTOMS: "What symptoms are you concerned about?"     Low blood sugar this am at 2 for 36. Woke up out of sleep with symptoms of sweating and feeling nervous 2. ONSET:  "When did the symptoms start?"     Today  3. BLOOD GLUCOSE: "What is your blood glucose level?"      At 3:30 pm 90 4. USUAL RANGE: "What is your blood glucose level usually?" (e.g., usual fasting morning value, usual evening value)     Na  5. TYPE 1 or 2:  "Do you know what type of diabetes you have?"  (e.g., Type 1, Type 2, Gestational; doesn't know)      na 6. INSULIN: "Do you take insulin?" "What type of insulin(s) do you use? What is the mode of delivery? (syringe, pen; injection or pump) "When did you last give yourself an insulin dose?" (i.e., time or hours/minutes ago) "How much did you give?" (i.e., how many units)     Yes see medication list  7. DIABETES PILLS: "Do you take any pills for your diabetes?"     Yes metformin , farxiga   8. OTHER SYMPTOMS: "Do you have any symptoms?" (e.g., fever, frequent urination, difficulty breathing, vomiting)     Difficulty breathing this am , not now , frequent urination, feel nervous, anxious  9. LOW BLOOD GLUCOSE TREATMENT: "What have you done so far to treat the low blood glucose level?"     Ate drank OJ.  10. FOOD: "When did you last eat or drink?"       Na  11. ALONE: "Are you alone right now or is someone with you?"        no 12. PREGNANCY: "Is there any chance you are pregnant?" "When was your last menstrual period?"       na  Protocols used: Diabetes - Low Blood Sugar-A-AH

## 2021-08-05 NOTE — Telephone Encounter (Signed)
I called but was unable to reach her.  Please advise her to decrease her Lantus to 38 units in the morning and 35 units at night.  Thanks.

## 2021-08-05 NOTE — Telephone Encounter (Signed)
Pt returned call, message from Dr. Alvis Lemmings reviewed. Pt verbalizes understanding.

## 2021-08-10 ENCOUNTER — Ambulatory Visit: Payer: Managed Care, Other (non HMO) | Admitting: Family Medicine

## 2021-08-20 ENCOUNTER — Other Ambulatory Visit: Payer: Self-pay

## 2021-08-23 ENCOUNTER — Other Ambulatory Visit: Payer: Self-pay

## 2021-09-02 ENCOUNTER — Ambulatory Visit: Payer: Self-pay

## 2021-09-02 NOTE — Telephone Encounter (Signed)
  Chief Complaint: HTN Symptoms: BP 180/99, dizziness, blurred vision, HA Frequency: since yesterday Pertinent Negatives: NA Disposition: [x] ED /[] Urgent Care (no appt availability in office) / [] Appointment(In office/virtual)/ []  [place] [facility]/ [] Home Care/ [] Refused Recommended Disposition /[]  Mobile Bus/ []  Follow-up with PCP Additional Notes: pt was sent home from work early last night d/t dizziness and blurred vision. Pt checked her BS and was 139, check BP was 189/100. Pt took medication this morning and checked BP 30 mins ago and was 180/99 with symptoms still present. Advised pt go to ED for evaluation. 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 I explained that that would be unsafe and if she goes to ED they will provider her with work note. Pt says she feels like BP elevated d/t soups and liquids she been eating since she had surgery and will have stitches taken out tomorrow. I advised her to drink plenty of water to help with BP but still needed to go to ED.   Reason for Disposition  [1] Systolic BP  >= 160 OR Diastolic >= 100 AND [2] cardiac or neurologic symptoms (e.g., chest pain, difficulty breathing, unsteady gait, blurred vision)  Answer Assessment - Initial Assessment Questions 2. ONSET: "When did you take your blood pressure?"     189/100 last night, BP 180/99 30 mins ago   4. HISTORY: "Do you have a history of high blood pressure?"     yes 5. MEDICATIONS: "Are you taking any medications for blood pressure?" "Have you missed any doses recently?"     Yes and no 6. OTHER SYMPTOMS: "Do you have any symptoms?" (e.g., headache, chest pain, blurred vision, difficulty breathing, weakness)     Dizziness, blurred vision, HA  Protocols used: Blood Pressure - High-A-AH

## 2021-09-17 ENCOUNTER — Telehealth: Payer: Managed Care, Other (non HMO) | Admitting: Physician Assistant

## 2021-09-17 ENCOUNTER — Ambulatory Visit: Payer: Self-pay

## 2021-09-17 DIAGNOSIS — E1142 Type 2 diabetes mellitus with diabetic polyneuropathy: Secondary | ICD-10-CM

## 2021-09-17 DIAGNOSIS — Z794 Long term (current) use of insulin: Secondary | ICD-10-CM

## 2021-09-17 NOTE — Telephone Encounter (Signed)
  Chief Complaint: Low blood sugars Symptoms: Very low blood sugars Frequency: ongoing Pertinent Negatives: Patient denies current low BS Disposition: [] ED /[] Urgent Care (no appt availability in office) / [x] Appointment(In office/virtual)/ []  [place] [facility]/ [] Home Care/ [] Refused Recommended Disposition /[] [place] [facility]/ []  Follow-up with PCP Additional Notes: Pt has been diabetic for 23 years. And it seems she has struggled a bit with keeping her blood sugar in a normal range. Pt is reporting that over the past few days she has had several instances of very low blood sugar in the mornings. Pt is on a few diabetes medications.   Summary: Blood sugar   Patient called in stating her sugar has been dropping. Yesterday it got down to 78 during the day and down to 56 while sleeping. She was a little dizzy this morning and she had to leave work yesterday and called out today. She wants to know if her insulin should be adjusted or what to do? Please assist patient further.      Reason for Disposition  [1] Morning (before breakfast) blood glucose < 80 mg/dL (4.4 mmol/L) AND more than once in past week  Answer Assessment - Initial Assessment Questions 1. SYMPTOMS: "What symptoms are you concerned about?"     Yesterday low 2. ONSET:  "When did the symptoms start?"     ongoing 3. BLOOD GLUCOSE: "What is your blood glucose level?"      135 4. USUAL RANGE: "What is your blood glucose level usually?" (e.g., usual fasting morning value, usual evening value)     Very low 5. TYPE 1 or 2:  "Do you know what type of diabetes you have?"  (e.g., Type 1, Type 2, Gestational; doesn't know)      Type 2 6. INSULIN: "Do you take insulin?" "What type of insulin(s) do you use? What is the mode of delivery? (syringe, pen; injection or pump) "When did you last give yourself an insulin dose?" (i.e., time or hours/minutes ago) "How much did you give?" (i.e., how many units)     yes 7. DIABETES  PILLS: "Do you take any pills for your diabetes?"     Yes. 8. OTHER SYMPTOMS: "Do you have any symptoms?" (e.g., fever, frequent urination, difficulty breathing, vomiting)     Dizziness 9. LOW BLOOD GLUCOSE TREATMENT: "What have you done so far to treat the low blood glucose level?"     Peanut butter sandwich. 10. FOOD: "When did you last eat or drink?"        11. ALONE: "Are you alone right now or is someone with you?"        alone 12. PREGNANCY: "Is there any chance you are pregnant?" "When was your last menstrual period?"       no  Protocols used: Diabetes - Low Blood Sugar-A-AH

## 2021-09-17 NOTE — Telephone Encounter (Signed)
Scheduled apt for Monday 7/14 at 1050 Patient aware.

## 2021-09-17 NOTE — Patient Instructions (Addendum)
Mariah Miller, thank you for joining Leeanne Rio, PA-C for today's virtual visit.  While this provider is not your primary care provider (PCP), if your PCP is located in our provider database this encounter information will be shared with them immediately following your visit.  Consent: (Patient) Mariah Miller provided verbal consent for this virtual visit at the beginning of the encounter.  Current Medications:  Current Outpatient Medications:    aspirin EC 81 MG tablet, Take 1 tablet (81 mg total) by mouth daily., Disp: 30 tablet, Rfl: 5   atorvastatin (LIPITOR) 80 MG tablet, Take 1 tablet (80 mg total) by mouth daily., Disp: 90 tablet, Rfl: 1   Blood Glucose Monitoring Suppl (TRUE METRIX METER) w/Device KIT, USE AS DIRECTED (Patient taking differently: 1 each by Other route as directed.), Disp: 1 kit, Rfl: 0   dapagliflozin propanediol (FARXIGA) 10 MG TABS tablet, Take 1 tablet (10 mg total) by mouth daily before breakfast., Disp: 30 tablet, Rfl: 6   Dulaglutide (TRULICITY) 3 BT/5.9RC SOPN, Inject 3 mg once a week as directed., Disp: 6 mL, Rfl: 1   gabapentin (NEURONTIN) 300 MG capsule, Take 2 capsules (600 mg total) by mouth 3 (three) times daily., Disp: 120 capsule, Rfl: 6   glucose blood (TRUE METRIX BLOOD GLUCOSE TEST) test strip, Use as instructed (Patient taking differently: 1 each by Other route as directed.), Disp: 100 each, Rfl: 12   insulin glargine-yfgn (SEMGLEE, YFGN,) 100 UNIT/ML Pen, Inject 40 Units into the skin 2 (two) times daily., Disp: 30 mL, Rfl: 3   insulin lispro (HUMALOG KWIKPEN) 100 UNIT/ML KwikPen, Use 0-12 units per sliding scale three times daily with meals., Disp: 15 mL, Rfl: 2   Insulin Pen Needle (UNIFINE PENTIPS) 32G X 4 MM MISC, Use as directed with insulin and victoza (Patient not taking: Reported on 05/10/2021), Disp: 100 each, Rfl: 0   lisinopril-hydrochlorothiazide (ZESTORETIC) 20-12.5 MG tablet, TAKE 2 TABLETS BY MOUTH ONCE DAILY., Disp: 180  tablet, Rfl: 1   metFORMIN (GLUCOPHAGE) 1000 MG tablet, Take 1 tablet by mouth 2 (two) times daily with a meal., Disp: 180 tablet, Rfl: 1   metoprolol succinate (TOPROL-XL) 100 MG 24 hr tablet, Take 1 tablet by mouth daily. Take with or immediately following a meal. (Patient not taking: Reported on 05/10/2021), Disp: 90 tablet, Rfl: 3   TRUEplus Lancets 28G MISC, USE AS INSTRUCTED (Patient taking differently: 1 each by Other route as directed.), Disp: 100 each, Rfl: 5   Medications ordered in this encounter:  No orders of the defined types were placed in this encounter.    *If you need refills on other medications prior to your next appointment, please contact your pharmacy*  Follow-Up: Call back or seek an in-person evaluation if the symptoms worsen or if the condition fails to improve as anticipated.  Other Instructions Please keep hydrated and keep a well-balanced diet.  Try to avoid skipping meals.  Decrease your nighttime long acting insulin from 35 units nightly to 30 units nightly.  If you note lower glucose levels in the daytime, you can decrease your AM dose from 38 to 36 units.  Continue other medications as directed. Please call your PCP office to schedule an in-person follow-up for next week.   If glucose levels are still getting below 60 over the weekend, please be evaluated in person at urgent care or ER.    If you have been instructed to have an in-person evaluation today at a local Urgent Care facility, please  use the link below. It will take you to a list of all of our available [facility], including address, phone number and hours of operation. Please do not delay care.  [facility]  If you or a family member do not have a primary care provider, use the link below to schedule a visit and establish care. When you choose a [facility] primary care physician or advanced practice provider, you gain a long-term partner in health. Find a Primary Care  Provider  Learn more about [facility]'s in-office and virtual care options: [facility]

## 2021-09-17 NOTE — Progress Notes (Signed)
Virtual Visit Consent   Mariah Miller, you are scheduled for a virtual visit with a [facility] provider today. Just as with appointments in the office, your consent must be obtained to participate. Your consent will be active for this visit and any virtual visit you may have with one of our providers in the next 365 days. If you have a MyChart account, a copy of this consent can be sent to you electronically.  As this is a virtual visit, video technology does not allow for your provider to perform a traditional examination. This may limit your provider's ability to fully assess your condition. If your provider identifies any concerns that need to be evaluated in person or the need to arrange testing (such as labs, EKG, etc.), we will make arrangements to do so. Although advances in technology are sophisticated, we cannot ensure that it will always work on either your end or our end. If the connection with a video visit is poor, the visit may have to be switched to a telephone visit. With either a video or telephone visit, we are not always able to ensure that we have a secure connection.  By engaging in this virtual visit, you consent to the provision of healthcare and authorize for your insurance to be billed (if applicable) for the services provided during this visit. Depending on your insurance coverage, you may receive a charge related to this service.  I need to obtain your verbal consent now. Are you willing to proceed with your visit today? Mariah Miller has provided verbal consent on 09/17/2021 for a virtual visit (video or telephone). Mariah Miller, [place]  Date: 09/17/2021 3:51 PM  Virtual Visit via Video Note   I, Mariah Miller, connected with  Mariah Miller  ([id number], May 06, 1975) on 09/17/21 at  2:30 PM EDT by a video-enabled telemedicine application and verified that I am speaking with the correct person using two identifiers.  Location: Patient: Virtual Visit  Location Patient: Home Provider: Virtual Visit Location Provider: Home Office   I discussed the limitations of evaluation and management by telemedicine and the availability of in person appointments. The patient expressed understanding and agreed to proceed.    History of Present Illness: Mariah Miller is a 46 y.o. who identifies as a female who was assigned female at birth, and is being seen today for a few episodes of hypoglycemia over the past few days. Patient is a Type II Diabetic for many years with history of diabetic neuropathy, hypertension, hyperlipidemia. No documented history of CKD in chart.   Lab Results  Component Value Date   HGBA1C 8.6 (A) 03/17/2021   She is currently on a regimen of Metformin 1000 mg BID, Farxiga 10 mg daily, Semglee 38 units nightly and 35 units each morning, Humalog following sliding scale (rare use per patient), Trulicity 3 mg IM weekly, Gabapentin 600 mg TID, Metoprolol XL 100 mg QD, Lisinopril-HCTZ 40-25 mg daily and Atorvastatin 80 mg daily.   Notes a yesterday at work feeling jittery and shaky with some nausea. Sat down and checked her sugar and noticed was a little low at 88. Notes she went ahead and had lunch, rechecking glucose after which has only increased to 91. Notes rechecking later before dinner that night with level of 101, going up to 191 after dinner. Woke up around 3 AM feeling jittery and shaky with glucose of 56. Notes she got up and had some OJ and peanut butter. On recheck an  hour later glucose only at 90. By breakfast back up to 118. Notes that she is worried about her glucose levels as she had a lower one the other morning as well. Notes she does work in a very hot environment and is not allowed a water bottle at her station so does not hydrate the best during the day. Notes since starting her Trulicity she is less hungry so not eating quite as much. Does not eat much at work either due to being so hot. Some increased stressors  recently. Notes making sure she is taking her medications as directed. Was not able to get in with her PCP today so nurse triage recommended a video assessment until she could follow-up with PCP.      HPI: HPI  Problems:  Patient Active Problem List   Diagnosis Date Noted   Moderate episode of recurrent major depressive disorder ([place]) 05/10/2021   Acute recurrent maxillary sinusitis 03/17/2021   Pain in joint of left knee 12/14/2020   Migraine 10/01/2020   Seizures ([place]) 10/01/2020   Eczema 05/26/2017   Primary insomnia 09/28/2016   Anxiety and depression 05/26/2016   OSA (obstructive sleep apnea) 04/30/2015   Uncontrolled type 2 diabetes mellitus with hyperglycemia, with long-term current use of insulin ([place]) 04/28/2015   ASCUS with positive high risk HPV cervical 06/06/2014   Dyslipidemia 03/17/2014   Essential hypertension 03/17/2014   Diabetic neuropathy, painful ([place]) 03/21/2013   A C DEGENERATION-CHRONIC 10/27/2008   IMPINGEMENT SYNDROME 05/01/2008    Allergies:  Allergies  Allergen Reactions   Tomato Itching and Rash   Medications:  Current Outpatient Medications:    aspirin EC 81 MG tablet, Take 1 tablet (81 mg total) by mouth daily., Disp: 30 tablet, Rfl: 5   atorvastatin (LIPITOR) 80 MG tablet, Take 1 tablet (80 mg total) by mouth daily., Disp: 90 tablet, Rfl: 1   Blood Glucose Monitoring Suppl (TRUE METRIX METER) w/Device KIT, USE AS DIRECTED (Patient taking differently: 1 each by Other route as directed.), Disp: 1 kit, Rfl: 0   dapagliflozin propanediol (FARXIGA) 10 MG TABS tablet, Take 1 tablet (10 mg total) by mouth daily before breakfast., Disp: 30 tablet, Rfl: 6   Dulaglutide (TRULICITY) 3 HK/7.4QV SOPN, Inject 3 mg once a week as directed., Disp: 6 mL, Rfl: 1   gabapentin (NEURONTIN) 300 MG capsule, Take 2 capsules (600 mg total) by mouth 3 (three) times daily., Disp: 120 capsule, Rfl: 6   glucose blood (TRUE METRIX BLOOD GLUCOSE TEST) test strip, Use as  instructed (Patient taking differently: 1 each by Other route as directed.), Disp: 100 each, Rfl: 12   insulin glargine-yfgn (SEMGLEE, YFGN,) 100 UNIT/ML Pen, Inject 40 Units into the skin 2 (two) times daily., Disp: 30 mL, Rfl: 3   insulin lispro (HUMALOG KWIKPEN) 100 UNIT/ML KwikPen, Use 0-12 units per sliding scale three times daily with meals., Disp: 15 mL, Rfl: 2   Insulin Pen Needle (UNIFINE PENTIPS) 32G X 4 MM MISC, Use as directed with insulin and victoza (Patient not taking: Reported on 05/10/2021), Disp: 100 each, Rfl: 0   lisinopril-hydrochlorothiazide (ZESTORETIC) 20-12.5 MG tablet, TAKE 2 TABLETS BY MOUTH ONCE DAILY., Disp: 180 tablet, Rfl: 1   metFORMIN (GLUCOPHAGE) 1000 MG tablet, Take 1 tablet by mouth 2 (two) times daily with a meal., Disp: 180 tablet, Rfl: 1   metoprolol succinate (TOPROL-XL) 100 MG 24 hr tablet, Take 1 tablet by mouth daily. Take with or immediately following a meal. (Patient not taking: Reported on 05/10/2021),  Disp: 90 tablet, Rfl: 3   TRUEplus Lancets 28G MISC, USE AS INSTRUCTED (Patient taking differently: 1 each by Other route as directed.), Disp: 100 each, Rfl: 5  Observations/Objective: Patient is well-developed, well-nourished in no acute distress.  Resting comfortably at home.  Head is normocephalic, atraumatic.  No labored breathing. Speech is clear and coherent with logical content.  Patient is alert and oriented at baseline.  Assessment and Plan: 1. Controlled type 2 diabetes mellitus with diabetic polyneuropathy, with long-term current use of insulin (HCC)  Some episodes of lower glucose overnight. Will have her try to keep hydrated and be more consistent with meals throughout the day. For now, will have her decrease PM glucose to 30 units from 35. Continue other medications as directed and follow-up with PCP early next week. UC/ER for any issues over the weekend.   Follow Up Instructions: I discussed the assessment and treatment plan with the  patient. The patient was provided an opportunity to ask questions and all were answered. The patient agreed with the plan and demonstrated an understanding of the instructions.  A copy of instructions were sent to the patient via MyChart unless otherwise noted below.   The patient was advised to call back or seek an in-person evaluation if the symptoms worsen or if the condition fails to improve as anticipated.  Time:  I spent 10 minutes with the patient via telehealth technology discussing the above problems/concerns.    Mariah Rio, PA-C

## 2021-09-20 ENCOUNTER — Encounter: Payer: Self-pay | Admitting: Family Medicine

## 2021-09-20 ENCOUNTER — Telehealth: Payer: Self-pay | Admitting: Family Medicine

## 2021-09-20 ENCOUNTER — Other Ambulatory Visit: Payer: Self-pay

## 2021-09-20 ENCOUNTER — Ambulatory Visit: Payer: Managed Care, Other (non HMO) | Attending: Family Medicine | Admitting: Family Medicine

## 2021-09-20 VITALS — BP 172/78 | HR 93 | Temp 98.3°F | Ht 65.0 in | Wt 214.2 lb

## 2021-09-20 DIAGNOSIS — E1142 Type 2 diabetes mellitus with diabetic polyneuropathy: Secondary | ICD-10-CM | POA: Diagnosis not present

## 2021-09-20 DIAGNOSIS — Z1211 Encounter for screening for malignant neoplasm of colon: Secondary | ICD-10-CM

## 2021-09-20 DIAGNOSIS — E08649 Diabetes mellitus due to underlying condition with hypoglycemia without coma: Secondary | ICD-10-CM | POA: Diagnosis not present

## 2021-09-20 DIAGNOSIS — Z794 Long term (current) use of insulin: Secondary | ICD-10-CM

## 2021-09-20 DIAGNOSIS — F419 Anxiety disorder, unspecified: Secondary | ICD-10-CM

## 2021-09-20 DIAGNOSIS — K5909 Other constipation: Secondary | ICD-10-CM

## 2021-09-20 DIAGNOSIS — F32A Depression, unspecified: Secondary | ICD-10-CM

## 2021-09-20 DIAGNOSIS — I152 Hypertension secondary to endocrine disorders: Secondary | ICD-10-CM

## 2021-09-20 DIAGNOSIS — R5383 Other fatigue: Secondary | ICD-10-CM

## 2021-09-20 DIAGNOSIS — E1159 Type 2 diabetes mellitus with other circulatory complications: Secondary | ICD-10-CM

## 2021-09-20 LAB — POCT GLYCOSYLATED HEMOGLOBIN (HGB A1C): HbA1c, POC (controlled diabetic range): 7.9 % — AB (ref 0.0–7.0)

## 2021-09-20 LAB — GLUCOSE, POCT (MANUAL RESULT ENTRY): POC Glucose: 112 mg/dl — AB (ref 70–99)

## 2021-09-20 MED ORDER — DEXCOM G6 TRANSMITTER MISC
1 refills | Status: DC
  Filled 2021-09-20 – 2022-03-07 (×2): qty 1, 90d supply, fill #0

## 2021-09-20 MED ORDER — INSULIN GLARGINE-YFGN 100 UNIT/ML ~~LOC~~ SOPN
PEN_INJECTOR | SUBCUTANEOUS | 3 refills | Status: DC
  Filled 2021-09-20: qty 21, 31d supply, fill #0
  Filled 2021-12-23: qty 21, 31d supply, fill #1
  Filled 2022-03-07 – 2022-03-08 (×2): qty 21, 31d supply, fill #2
  Filled 2022-04-11 – 2022-05-22 (×2): qty 21, 31d supply, fill #3
  Filled 2022-08-29: qty 21, 31d supply, fill #4

## 2021-09-20 MED ORDER — LINACLOTIDE 72 MCG PO CAPS
72.0000 ug | ORAL_CAPSULE | Freq: Every day | ORAL | 3 refills | Status: DC
  Filled 2021-09-20: qty 30, 30d supply, fill #0
  Filled 2022-03-07: qty 30, 30d supply, fill #1
  Filled 2022-04-11 – 2022-05-23 (×5): qty 30, 30d supply, fill #2

## 2021-09-20 MED ORDER — DEXCOM G6 SENSOR MISC
2 refills | Status: DC
  Filled 2021-09-20 – 2022-03-07 (×2): qty 3, 30d supply, fill #0

## 2021-09-20 MED ORDER — DEXCOM G6 RECEIVER DEVI
0 refills | Status: DC
  Filled 2021-09-20: qty 1, 30d supply, fill #0
  Filled 2021-09-21: qty 1, 34d supply, fill #0
  Filled 2022-03-07: qty 1, 30d supply, fill #0

## 2021-09-20 MED ORDER — DULOXETINE HCL 60 MG PO CPEP
60.0000 mg | ORAL_CAPSULE | Freq: Every day | ORAL | 3 refills | Status: DC
  Filled 2021-09-20: qty 30, 30d supply, fill #0
  Filled 2022-03-07: qty 30, 30d supply, fill #1
  Filled 2022-04-11 – 2022-05-23 (×4): qty 30, 30d supply, fill #2

## 2021-09-20 NOTE — Addendum Note (Signed)
Addended by: Lois Huxley, Jeannett Senior L on: 09/20/2021 04:44 PM   Modules accepted: Orders

## 2021-09-20 NOTE — Telephone Encounter (Signed)
Mariah Miller,   This pt is on two insulins + Trulicity. Are we able to get Dexcom approved for her? It looks like it is covered with a PA on my end but I'm not sure.

## 2021-09-20 NOTE — Progress Notes (Signed)
Subjective:  Patient ID: Mariah Miller, female    DOB: 1975/07/02  Age: 46 y.o. MRN: [id number]  CC: Diabetes   HPI Mariah Miller is a 46 y.o. year old female with a history of type 2 diabetes mellitus (A1c 7.9), hypertension, depression.  Interval History: Today she complains of a number of symptoms. Fatigue prevents her from getting out of the bed on some days even if she has a good night's rest.  This happens on a couple of days per week.  Tingling and numbness in hands is not controlled on Gabapentin and she sometimes has to 'beat her hand' for relief.  She also suffers from anxiety which is described as wanting to repeatedly do things like clean the house and having to constantly do something due to a nervous feeling on the inside.  She does have a history of major depressive disorder.  Constipation has also been going on for sometime and she has not responded to enemas and miralax  She has had hypoglycemia to the 50s and she had nausea associated with it at work last week.Marland Kitchen Her BP was also elevated at that time and her HR is recommending she be taken out of work for sometime as she works on [employer].  She did have a virtual visit with her insulin dose was decreased to 30 6 in the morning and 30 at night.  Her office would require her to be out of work for 7 working days so she will get paid on this will take her out through 09/30/21.  She also adds that it is very hot at her station at work and she is unable to get water to stay hydrated until someone from the front office brings a drink for her. Blood sugar log reveals blood sugars of 79-191. Her blood pressure is elevated and she is yet to take antihypertensive as she is fasting in anticipation of labs and she has nausea when she takes her medications on an empty stomach.  Past Medical History:  Diagnosis Date   Depression    Diabetes mellitus without complication ([place])    Hypertension    Migraine    Seizures ([place])     self reported - last seizure was Oct 2019 "absent seizure"    Past Surgical History:  Procedure Laterality Date   CHOLECYSTECTOMY     CYST EXCISION     SHOULDER OPEN ROTATOR CUFF REPAIR Left    TONSILLECTOMY     TUBAL LIGATION      Family History  Problem Relation Age of Onset   Hypertension Mother    Thyroid disease Mother    Irregular heart beat Mother    Bipolar disorder Sister    Colon cancer Other    Esophageal cancer Neg Hx    Rectal cancer Neg Hx    Stomach cancer Neg Hx     Social History   Socioeconomic History   Marital status: Divorced    Spouse name: Not on file   Number of children: Not on file   Years of education: Not on file   Highest education level: Not on file  Occupational History    Comment: Assembler   Tobacco Use   Smoking status: Former    Types: Cigarettes   Smokeless tobacco: Never   Tobacco comments:    Smoked for 4 years, quit age 40  Vaping Use   Vaping Use: Never used  Substance and Sexual Activity   Alcohol use: Yes   Drug  use: No   Sexual activity: Yes    Birth control/protection: None    Comment: Starteds menstral cycle today 12-12-2018 pt reported  Other Topics Concern   Not on file  Social History Narrative   Not on file   Social Determinants of Health   Financial Resource Strain: Not on file  Food Insecurity: Not on file  Transportation Needs: Not on file  Physical Activity: Not on file  Stress: Not on file  Social Connections: Not on file    Allergies  Allergen Reactions   Tomato Itching and Rash    Outpatient Medications Prior to Visit  Medication Sig Dispense Refill   aspirin EC 81 MG tablet Take 1 tablet (81 mg total) by mouth daily. 30 tablet 5   atorvastatin (LIPITOR) 80 MG tablet Take 1 tablet (80 mg total) by mouth daily. 90 tablet 1   Blood Glucose Monitoring Suppl (TRUE METRIX METER) w/Device KIT USE AS DIRECTED (Patient taking differently: 1 each by Other route as directed.) 1 kit 0   dapagliflozin  propanediol (FARXIGA) 10 MG TABS tablet Take 1 tablet (10 mg total) by mouth daily before breakfast. 30 tablet 6   Dulaglutide (TRULICITY) 3 MG/0.5ML SOPN Inject 3 mg once a week as directed. 6 mL 1   gabapentin (NEURONTIN) 300 MG capsule Take 2 capsules (600 mg total) by mouth 3 (three) times daily. 120 capsule 6   glucose blood (TRUE METRIX BLOOD GLUCOSE TEST) test strip Use as instructed (Patient taking differently: 1 each by Other route as directed.) 100 each 12   insulin lispro (HUMALOG KWIKPEN) 100 UNIT/ML KwikPen Use 0-12 units per sliding scale three times daily with meals. 15 mL 2   Insulin Pen Needle (UNIFINE PENTIPS) 32G X 4 MM MISC Use as directed with insulin and victoza 100 each 0   lisinopril-hydrochlorothiazide (ZESTORETIC) 20-12.5 MG tablet TAKE 2 TABLETS BY MOUTH ONCE DAILY. 180 tablet 1   metFORMIN (GLUCOPHAGE) 1000 MG tablet Take 1 tablet by mouth 2 (two) times daily with a meal. 180 tablet 1   metoprolol succinate (TOPROL-XL) 100 MG 24 hr tablet Take 1 tablet by mouth daily. Take with or immediately following a meal. 90 tablet 3   TRUEplus Lancets 28G MISC USE AS INSTRUCTED (Patient taking differently: 1 each by Other route as directed.) 100 each 5   insulin glargine-yfgn (SEMGLEE, YFGN,) 100 UNIT/ML Pen Inject 40 Units into the skin 2 (two) times daily. 30 mL 3   No facility-administered medications prior to visit.     ROS Review of Systems  Constitutional:  Positive for fatigue. Negative for activity change and appetite change.  HENT:  Negative for congestion, sinus pressure and sore throat.   Eyes:  Negative for visual disturbance.  Respiratory:  Negative for cough, chest tightness, shortness of breath and wheezing.   Cardiovascular:  Negative for chest pain and palpitations.  Gastrointestinal:  Positive for constipation. Negative for abdominal distention and abdominal pain.  Endocrine: Negative for polydipsia.  Genitourinary:  Negative for dysuria and frequency.   Musculoskeletal:  Negative for arthralgias and back pain.  Skin:  Negative for rash.  Neurological:  Positive for numbness. Negative for tremors and light-headedness.  Hematological:  Does not bruise/bleed easily.  Psychiatric/Behavioral:  Negative for agitation and behavioral problems.     Objective:  BP (!) 172/78   Pulse 93   Temp 98.3 F (36.8 C) (Oral)   Ht 5\' 5"  (1.651 m)   Wt 214 lb 3.2 oz (97.2 kg)  SpO2 100%   BMI 35.64 kg/m      09/20/2021   10:58 AM 05/10/2021    2:53 PM 03/17/2021   10:41 AM  BP/Weight  Systolic BP 308 657 846  Diastolic BP 78 80 85  Wt. (Lbs) 214.2 214.6 211.2  BMI 35.64 kg/m2 35.71 kg/m2 35.15 kg/m2      Physical Exam Constitutional:      Appearance: She is well-developed.  Cardiovascular:     Rate and Rhythm: Normal rate.     Heart sounds: Normal heart sounds. No murmur heard. Pulmonary:     Effort: Pulmonary effort is normal.     Breath sounds: Normal breath sounds. No wheezing or rales.  Chest:     Chest wall: No tenderness.  Abdominal:     General: Bowel sounds are normal. There is no distension.     Palpations: Abdomen is soft. There is no mass.     Tenderness: There is no abdominal tenderness.  Musculoskeletal:        General: Normal range of motion.     Right lower leg: No edema.     Left lower leg: No edema.  Neurological:     Mental Status: She is alert and oriented to person, place, and time.  Psychiatric:        Mood and Affect: Mood normal.        Latest Ref Rng & Units 11/03/2020    6:48 AM 10/28/2020    9:56 AM 10/12/2020    5:54 AM  CMP  Glucose 70 - 99 mg/dL 322  120  196   BUN 6 - 20 mg/dL $Remove'23  13  19   'CvhZxns$ Creatinine 0.44 - 1.00 mg/dL 1.35  0.92  1.16   Sodium 135 - 145 mmol/L 133  138  133   Potassium 3.5 - 5.1 mmol/L 3.7  4.8  4.1   Chloride 98 - 111 mmol/L 98  102  101   CO2 22 - 32 mmol/L $RemoveB'25  22  26   'eItiiKuw$ Calcium 8.9 - 10.3 mg/dL 8.8  9.2  8.9   Total Protein 6.5 - 8.1 g/dL 7.1     Total Bilirubin 0.3 -  1.2 mg/dL 0.7     Alkaline Phos 38 - 126 U/L 73     AST 15 - 41 U/L 19     ALT 0 - 44 U/L 14       Lipid Panel     Component Value Date/Time   CHOL 173 10/28/2020 0956   TRIG 116 10/28/2020 0956   HDL 40 10/28/2020 0956   CHOLHDL 4.3 10/28/2020 0956   CHOLHDL 4.7 05/25/2016 1543   VLDL 37 (H) 05/25/2016 1543   LDLCALC 112 (H) 10/28/2020 0956    CBC    Component Value Date/Time   WBC 8.0 11/03/2020 0648   RBC 4.16 11/03/2020 0648   HGB 12.3 11/03/2020 0648   HCT 37.8 11/03/2020 0648   PLT 417 (H) 11/03/2020 0648   MCV 90.9 11/03/2020 0648   MCH 29.6 11/03/2020 0648   MCHC 32.5 11/03/2020 0648   RDW 13.3 11/03/2020 0648   LYMPHSABS 1.9 10/01/2020 0835   MONOABS 0.5 10/01/2020 0835   EOSABS 0.1 10/01/2020 0835   BASOSABS 0.1 10/01/2020 0835    Lab Results  Component Value Date   HGBA1C 7.9 (A) 09/20/2021    Assessment & Plan:  1. Type 2 diabetes mellitus with diabetic polyneuropathy, with long-term current use of insulin (HCC) Improved with A1c of 7.9 up  from 8.6 previously Due to hypoglycemia, I will keep her on the reduced dose of Semglee which was prescribed at her telehealth visit recently Advised against skipping meals as her work condition does not allow her to take a break until she has someone to relieve her at the [employer] on management of hypoglycemia including reducing Semglee by 2 units twice daily if this occurs Counseled on Diabetic diet, my plate method, 341 minutes of moderate intensity exercise/week Blood sugar logs with fasting goals of 80-120 mg/dl, random of less than 180 and in the event of sugars less than 60 mg/dl or greater than 400 mg/dl encouraged to notify the clinic. Advised on the need for annual eye exams, annual foot exams, Pneumonia vaccine. - POCT glucose (manual entry) - POCT glycosylated hemoglobin (Hb A1C) - DULoxetine (CYMBALTA) 60 MG capsule; Take 1 capsule (60 mg total) by mouth daily.  Dispense: 30 capsule; Refill: 3 -  insulin glargine-yfgn (SEMGLEE, YFGN,) 100 UNIT/ML Pen; Inject twice daily 36 units in the morning and 30 units in the evening  Dispense: 30 mL; Refill: 3 - Microalbumin / creatinine urine ratio - LP+Non-HDL Cholesterol - CMP14+EGFR  2. Screening for colon cancer - Ambulatory referral to Gastroenterology  3. Other constipation Counseled on increasing fiber intake, fruits and vegetable, limit intake of foods like cheese, white bread, white rice - linaclotide (LINZESS) 72 MCG capsule; Take 1 capsule (72 mcg total) by mouth daily before breakfast.  Dispense: 30 capsule; Refill: 3  4. Anxiety and depression We will initiate Cymbalta which will also help with neuropathy - DULoxetine (CYMBALTA) 60 MG capsule; Take 1 capsule (60 mg total) by mouth daily.  Dispense: 30 capsule; Refill: 3  5. Other fatigue - VITAMIN D 25 Hydroxy (Vit-D Deficiency, Fractures) - TSH - T4, free - CBC with Differential/Platelet  6. Hypertension associated with diabetes ([place]) Uncontrolled due to the fact that she is yet to take her antihypertensive Advised to take her antihypertensive consistently and she will follow-up with the clinical pharmacist to reassess her blood pressure  7. Diabetes mellitus due to underlying condition with hypoglycemia without coma, with long-term current use of insulin ([place]) See #1 above.  Provided note for work to return next Friday, 10/01/2021.  Advised her that it is possible she could have suffered heat exhaustion at work.  I have adjusted her insulin regimen and to prevent hypoglycemia but she would need to speak with her boss about staying hydrated at work.    Meds ordered this encounter  Medications   DULoxetine (CYMBALTA) 60 MG capsule    Sig: Take 1 capsule (60 mg total) by mouth daily.    Dispense:  30 capsule    Refill:  3   insulin glargine-yfgn (SEMGLEE, YFGN,) 100 UNIT/ML Pen    Sig: Inject twice daily 36 units in the morning and 30 units in the evening    Dispense:   30 mL    Refill:  3    Dose decrease   linaclotide (LINZESS) 72 MCG capsule    Sig: Take 1 capsule (72 mcg total) by mouth daily before breakfast.    Dispense:  30 capsule    Refill:  3    Follow-up: Return in about 2 weeks (around 10/04/2021) for Blood pressure follow-up with Lurena Joiner, Medical conditions with PCP in 3 months.       Charlott Rakes, MD, FAAFP. [facility] [place], [place]   09/20/2021, 12:55 PM

## 2021-09-20 NOTE — Telephone Encounter (Signed)
Can you please help with a CGM for her? Thanks

## 2021-09-20 NOTE — Progress Notes (Signed)
States that she is nervous a lot Very tired Constipation Tingling and numbness in hands.

## 2021-09-21 ENCOUNTER — Other Ambulatory Visit: Payer: Self-pay

## 2021-09-21 ENCOUNTER — Other Ambulatory Visit: Payer: Self-pay | Admitting: Family Medicine

## 2021-09-21 LAB — LP+NON-HDL CHOLESTEROL
Cholesterol, Total: 177 mg/dL (ref 100–199)
HDL: 39 mg/dL — ABNORMAL LOW (ref >39)
LDL Chol Calc (NIH): 112 mg/dL — ABNORMAL HIGH (ref 0–99)
Total Non-HDL-Chol (LDL+VLDL): 138 mg/dL — ABNORMAL HIGH (ref 0–129)
Triglycerides: 145 mg/dL (ref 0–149)
VLDL Cholesterol Cal: 26 mg/dL (ref 5–40)

## 2021-09-21 LAB — CBC WITH DIFFERENTIAL/PLATELET

## 2021-09-21 LAB — CMP14+EGFR
ALT: 16 IU/L (ref 0–32)
AST: 19 IU/L (ref 0–40)
Albumin/Globulin Ratio: 1.5 (ref 1.2–2.2)
Albumin: 4 g/dL (ref 3.9–4.9)
Alkaline Phosphatase: 85 IU/L (ref 44–121)
BUN/Creatinine Ratio: 10 (ref 9–23)
BUN: 8 mg/dL (ref 6–24)
Bilirubin Total: 0.2 mg/dL (ref 0.0–1.2)
CO2: 21 mmol/L (ref 20–29)
Calcium: 9.1 mg/dL (ref 8.7–10.2)
Chloride: 106 mmol/L (ref 96–106)
Creatinine, Ser: 0.81 mg/dL (ref 0.57–1.00)
Globulin, Total: 2.6 g/dL (ref 1.5–4.5)
Glucose: 95 mg/dL (ref 70–99)
Potassium: 4.5 mmol/L (ref 3.5–5.2)
Sodium: 139 mmol/L (ref 134–144)
Total Protein: 6.6 g/dL (ref 6.0–8.5)
eGFR: 91 mL/min/{1.73_m2} (ref >59)

## 2021-09-21 LAB — MICROALBUMIN / CREATININE URINE RATIO
Creatinine, Urine: 322.3 mg/dL
Microalb/Creat Ratio: 16 mg/g creat (ref 0–29)
Microalbumin, Urine: 52 ug/mL

## 2021-09-21 LAB — TSH: TSH: 0.98 u[IU]/mL (ref 0.450–4.500)

## 2021-09-21 LAB — T4, FREE: Free T4: 0.97 ng/dL (ref 0.82–1.77)

## 2021-09-21 LAB — VITAMIN D 25 HYDROXY (VIT D DEFICIENCY, FRACTURES): Vit D, 25-Hydroxy: 7.4 ng/mL — ABNORMAL LOW (ref 30.0–100.0)

## 2021-09-21 MED ORDER — ROSUVASTATIN CALCIUM 20 MG PO TABS
20.0000 mg | ORAL_TABLET | Freq: Every day | ORAL | 1 refills | Status: DC
  Filled 2021-09-21: qty 30, 30d supply, fill #0
  Filled 2022-03-07: qty 30, 30d supply, fill #1
  Filled 2022-04-11: qty 30, 30d supply, fill #2

## 2021-09-21 MED ORDER — ERGOCALCIFEROL 1.25 MG (50000 UT) PO CAPS
50000.0000 [IU] | ORAL_CAPSULE | ORAL | 1 refills | Status: AC
  Filled 2021-09-21: qty 4, 28d supply, fill #0
  Filled 2022-03-07: qty 4, 28d supply, fill #1
  Filled 2022-04-11: qty 4, 28d supply, fill #2

## 2021-09-22 ENCOUNTER — Other Ambulatory Visit: Payer: Self-pay

## 2021-09-22 NOTE — Telephone Encounter (Signed)
Thank you :)

## 2021-09-22 NOTE — Telephone Encounter (Signed)
Just fyi - we got the Dexcom system approved for her.

## 2021-09-28 ENCOUNTER — Ambulatory Visit: Payer: Managed Care, Other (non HMO) | Attending: Family Medicine | Admitting: Family Medicine

## 2021-09-28 ENCOUNTER — Encounter: Payer: Self-pay | Admitting: Family Medicine

## 2021-09-28 DIAGNOSIS — I152 Hypertension secondary to endocrine disorders: Secondary | ICD-10-CM

## 2021-09-28 DIAGNOSIS — E1142 Type 2 diabetes mellitus with diabetic polyneuropathy: Secondary | ICD-10-CM

## 2021-09-28 DIAGNOSIS — Z794 Long term (current) use of insulin: Secondary | ICD-10-CM

## 2021-09-28 DIAGNOSIS — E1159 Type 2 diabetes mellitus with other circulatory complications: Secondary | ICD-10-CM | POA: Diagnosis not present

## 2021-09-28 NOTE — Progress Notes (Signed)
Virtual Visit via Telephone Note  I connected with Mariah Miller, on 09/28/2021 at 1:46 PM by telephone and verified that I am speaking with the correct person using two identifiers.   Consent: I discussed the limitations, risks, security and privacy concerns of performing an evaluation and management service by telephone and the availability of in person appointments. I also discussed with the patient that there may be a patient responsible charge related to this service. The patient expressed understanding and agreed to proceed.   Location of Patient: Home  Location of Provider: Chronic   Persons participating in Telemedicine visit: Mariah Miller Dr. Alvis Lemmings     History of Present Illness: Mariah Miller is a 46 y.o. year old female with a history of type 2 diabetes mellitus (A1c 7.9), hypertension, depression. She had a visit for chronic disease management 2 weeks ago.  She is seen today for telehealth visit for completion of short-term disability paperwork with [employer]. At her last visit she had informed me human resources personnel had requested she be out of work for some time due to an episode of hypoglycemia and elevated blood pressure.  I written her note to take her out of work through 09/30/2021 as one had to be held for 7 days. She was asymptomatic at the time and her diabetic regimen was adjusted Past Medical History:  Diagnosis Date   Depression    Diabetes mellitus without complication (HCC)    Hypertension    Migraine    Seizures (HCC)    self reported - last seizure was Oct 2019 "absent seizure"   Allergies  Allergen Reactions   Tomato Itching and Rash    Current Outpatient Medications on File Prior to Visit  Medication Sig Dispense Refill   aspirin EC 81 MG tablet Take 1 tablet (81 mg total) by mouth daily. 30 tablet 5   Blood Glucose Monitoring Suppl (TRUE METRIX METER) w/Device KIT USE AS DIRECTED (Patient taking differently: 1 each by  Other route as directed.) 1 kit 0   Continuous Blood Gluc Receiver (DEXCOM G6 RECEIVER) DEVI Use to check blood sugar three times daily. . 1 each 0   Continuous Blood Gluc Sensor (DEXCOM G6 SENSOR) MISC Use to check blood sugar three times daily. Change sensors once every 10 days. Dx code E11.42 3 each 2   Continuous Blood Gluc Transmit (DEXCOM G6 TRANSMITTER) MISC Use to check blood sugar three times daily. Change transmitter once every 90 days. 1 each 1   dapagliflozin propanediol (FARXIGA) 10 MG TABS tablet Take 1 tablet (10 mg total) by mouth daily before breakfast. 30 tablet 6   Dulaglutide (TRULICITY) 3 MG/0.5ML SOPN Inject 3 mg once a week as directed. 6 mL 1   DULoxetine (CYMBALTA) 60 MG capsule Take 1 capsule (60 mg total) by mouth daily. 30 capsule 3   ergocalciferol (DRISDOL) 1.25 MG (50000 UT) capsule Take 1 capsule (50,000 Units total) by mouth once a week. 12 capsule 1   gabapentin (NEURONTIN) 300 MG capsule Take 2 capsules (600 mg total) by mouth 3 (three) times daily. 120 capsule 6   glucose blood (TRUE METRIX BLOOD GLUCOSE TEST) test strip Use as instructed (Patient taking differently: 1 each by Other route as directed.) 100 each 12   insulin glargine-yfgn (SEMGLEE, YFGN,) 100 UNIT/ML Pen Inject twice daily 36 units in the morning and 30 units in the evening 30 mL 3   insulin lispro (HUMALOG KWIKPEN) 100 UNIT/ML KwikPen Use 0-12 units per  sliding scale three times daily with meals. 15 mL 2   Insulin Pen Needle (UNIFINE PENTIPS) 32G X 4 MM MISC Use as directed with insulin and victoza 100 each 0   linaclotide (LINZESS) 72 MCG capsule Take 1 capsule (72 mcg total) by mouth daily before breakfast. 30 capsule 3   lisinopril-hydrochlorothiazide (ZESTORETIC) 20-12.5 MG tablet TAKE 2 TABLETS BY MOUTH ONCE DAILY. 180 tablet 1   metFORMIN (GLUCOPHAGE) 1000 MG tablet Take 1 tablet by mouth 2 (two) times daily with a meal. 180 tablet 1   metoprolol succinate (TOPROL-XL) 100 MG 24 hr tablet  Take 1 tablet by mouth daily. Take with or immediately following a meal. 90 tablet 3   rosuvastatin (CRESTOR) 20 MG tablet Take 1 tablet (20 mg total) by mouth daily. 90 tablet 1   TRUEplus Lancets 28G MISC USE AS INSTRUCTED (Patient taking differently: 1 each by Other route as directed.) 100 each 5   No current facility-administered medications on file prior to visit.    ROS: See HPI  Observations/Objective: Awake, alert, oriented x3 Not in acute distress Normal mood      Latest Ref Rng & Units 09/20/2021   11:47 AM 11/03/2020    6:48 AM 10/28/2020    9:56 AM  CMP  Glucose 70 - 99 mg/dL 95  322  120   BUN 6 - 24 mg/dL $Remove'8  23  13   'ZAbtinY$ Creatinine 0.57 - 1.00 mg/dL 0.81  1.35  0.92   Sodium 134 - 144 mmol/L 139  133  138   Potassium 3.5 - 5.2 mmol/L 4.5  3.7  4.8   Chloride 96 - 106 mmol/L 106  98  102   CO2 20 - 29 mmol/L $RemoveB'21  25  22   'pjWEHPrm$ Calcium 8.7 - 10.2 mg/dL 9.1  8.8  9.2   Total Protein 6.0 - 8.5 g/dL 6.6  7.1    Total Bilirubin 0.0 - 1.2 mg/dL 0.2  0.7    Alkaline Phos 44 - 121 IU/L 85  73    AST 0 - 40 IU/L 19  19    ALT 0 - 32 IU/L 16  14      Lipid Panel     Component Value Date/Time   CHOL 177 09/20/2021 1147   TRIG 145 09/20/2021 1147   HDL 39 (L) 09/20/2021 1147   CHOLHDL 4.3 10/28/2020 0956   CHOLHDL 4.7 05/25/2016 1543   VLDL 37 (H) 05/25/2016 1543   LDLCALC 112 (H) 09/20/2021 1147   LABVLDL 26 09/20/2021 1147    Lab Results  Component Value Date   HGBA1C 7.9 (A) 09/20/2021    Assessment and Plan: 1. Type 2 diabetes mellitus with diabetic polyneuropathy, with long-term current use of insulin (HCC) Not fully optimized with A1c of 7.9 She has had hypoglycemic episodes in the past and long-acting insulin dose had to be decreased Completed short-term disability form. Counseled on Diabetic diet, my plate method, 056 minutes of moderate intensity exercise/week Blood sugar logs with fasting goals of 80-120 mg/dl, random of less than 180 and in the event of  sugars less than 60 mg/dl or greater than 400 mg/dl encouraged to notify the clinic. Advised on the need for annual eye exams, annual foot exams, Pneumonia vaccine.  2. Hypertension associated with diabetes ([place]) BP was uncontrolled at last office visit at 172/78 but she had stated she was yet to take her antihypertensive She has an upcoming visit with the clinical pharmacist to reassess her blood pressure  and make regimen adjustment if indicated. Counseled on blood pressure goal of less than 130/80, low-sodium, DASH diet, medication compliance, 150 minutes of moderate intensity exercise per week. Discussed medication compliance, adverse effects.   Follow Up Instructions: Keep previously scheduled appointment   I discussed the assessment and treatment plan with the patient. The patient was provided an opportunity to ask questions and all were answered. The patient agreed with the plan and demonstrated an understanding of the instructions.   The patient was advised to call back or seek an in-person evaluation if the symptoms worsen or if the condition fails to improve as anticipated.     I provided 12 minutes total of non-face-to-face time during this encounter.   Charlott Rakes, MD, FAAFP. [facility] and [facility], [place]   09/28/2021, 1:46 PM

## 2021-09-30 ENCOUNTER — Telehealth: Payer: Self-pay

## 2021-09-30 NOTE — Telephone Encounter (Signed)
Patient was called and VM was left informing patient that paperwork has been faxed over to [facility]. The original copies will be placed upfront for pick up.

## 2021-10-26 ENCOUNTER — Ambulatory Visit: Payer: Managed Care, Other (non HMO) | Admitting: Pharmacist

## 2021-11-23 ENCOUNTER — Ambulatory Visit: Payer: Managed Care, Other (non HMO) | Admitting: Family Medicine

## 2021-11-24 ENCOUNTER — Ambulatory Visit: Payer: Self-pay

## 2021-11-24 ENCOUNTER — Other Ambulatory Visit: Payer: Self-pay

## 2021-11-24 ENCOUNTER — Telehealth: Payer: Managed Care, Other (non HMO) | Admitting: Emergency Medicine

## 2021-11-24 DIAGNOSIS — L0291 Cutaneous abscess, unspecified: Secondary | ICD-10-CM | POA: Diagnosis not present

## 2021-11-24 MED ORDER — DOXYCYCLINE HYCLATE 100 MG PO CAPS
100.0000 mg | ORAL_CAPSULE | Freq: Two times a day (BID) | ORAL | 0 refills | Status: DC
  Filled 2021-11-24 – 2022-03-07 (×2): qty 20, 10d supply, fill #0

## 2021-11-24 NOTE — Telephone Encounter (Signed)
   Chief Complaint: boil Symptoms: pain, drainage Frequency: 3 days Pertinent Negatives: Patient denies fever Disposition: [] ED /[] Urgent Care (no appt availability in office) / [] Appointment(In office/virtual)/ [x]  [facility]/ [] Home Care/ [] Refused Recommended Disposition /[] [facility]/ []  Follow-up with PCP Additional Notes: As pt went on talking I changed disposition to Virtual UC visit. Stated she has a hx of boil becoming infected and having to have surgery and a hosp stay because she did not have rx med for it. MyChart UC visit scheduled for 11:45 AM.  Reason for Disposition  [1] Boil AND [2] not improved > 3 days following Care Advice  SEVERE pain (e.g., excruciating)  Answer Assessment - Initial Assessment Questions 1. APPEARANCE of BOIL: "What does the boil look like?"      It is white now after soaking, drainage pus and blood 2. LOCATION: "Where is the boil located?"      Left buttock in the fold 3. NUMBER: "How many boils are there?"      one 4. SIZE: "How big is the boil?" (e.g., inches, cm; compare to size of a coin or other object)     Larger than a quarter 5. ONSET: "When did the boil start?"     3 days ago 6. PAIN: "Is there any pain?" If Yes, ask: "How bad is the pain?"   (Scale 1-10; or mild, moderate, severe)     Yes, very bad 7. FEVER: "Do you have a fever?" If Yes, ask: "What is it, how was it measured, and when did it start?"      no 8. SOURCE: "Have you been around anyone with boils or other Staph infections?" "Have you ever had boils before?"     No, iron low, has DM 9. OTHER SYMPTOMS: "Do you have any other symptoms?" (e.g., shaking chills, weakness, rash elsewhere on body)     no 10. PREGNANCY: "Is there any chance you are pregnant?" "When was your last menstrual period?"       no  Protocols used: Boil (Skin Abscess)-A-AH

## 2021-11-24 NOTE — Telephone Encounter (Signed)
Patient called, left VM to return the call to the office to discuss symptoms with a nurse.  Summary: question about what medication can be used for boil   Pt requests return call to advise what she can do or what medication can be used for a boil on her buttocks. Cb# [phone]

## 2021-11-24 NOTE — Progress Notes (Signed)
Virtual Visit Consent   Mariah Miller, you are scheduled for a virtual visit with a [facility] provider today. Just as with appointments in the office, your consent must be obtained to participate. Your consent will be active for this visit and any virtual visit you may have with one of our providers in the next 365 days. If you have a MyChart account, a copy of this consent can be sent to you electronically.  As this is a virtual visit, video technology does not allow for your provider to perform a traditional examination. This may limit your provider's ability to fully assess your condition. If your provider identifies any concerns that need to be evaluated in person or the need to arrange testing (such as labs, EKG, etc.), we will make arrangements to do so. Although advances in technology are sophisticated, we cannot ensure that it will always work on either your end or our end. If the connection with a video visit is poor, the visit may have to be switched to a telephone visit. With either a video or telephone visit, we are not always able to ensure that we have a secure connection.  By engaging in this virtual visit, you consent to the provision of healthcare and authorize for your insurance to be billed (if applicable) for the services provided during this visit. Depending on your insurance coverage, you may receive a charge related to this service.  I need to obtain your verbal consent now. Are you willing to proceed with your visit today? Mariah Miller has provided verbal consent on 11/24/2021 for a virtual visit (video or telephone). Montine Circle, PA-C  Date: 11/24/2021 11:38 AM  Virtual Visit via Video Note   I, Montine Circle, connected with  Mariah Miller  ([id number], [birth year]) on 11/24/21 at 11:45 AM EDT by a video-enabled telemedicine application and verified that I am speaking with the correct person using two identifiers.  Location: Patient: Virtual Visit  Location Patient: Home Provider: Virtual Visit Location Provider: Home   I discussed the limitations of evaluation and management by telemedicine and the availability of in person appointments. The patient expressed understanding and agreed to proceed.    History of Present Illness: Mariah Miller is a 46 y.o. who identifies as a female who was assigned female at birth, and is being seen today for boil on left buttocks. Has been trying warm soaks and warm compresses.  She states that she can barely walk or sit because of the pain.  States that she has needed incision and drainage in the past.  HPI: HPI  Problems:  Patient Active Problem List   Diagnosis Date Noted   Acute recurrent maxillary sinusitis 03/17/2021   Pain in joint of left knee 12/14/2020   Migraine 10/01/2020   Eczema 05/26/2017   Primary insomnia 09/28/2016   Anxiety and depression 05/26/2016   OSA (obstructive sleep apnea) 04/30/2015   Uncontrolled type 2 diabetes mellitus with hyperglycemia, with long-term current use of insulin ([place]) 04/28/2015   ASCUS with positive high risk HPV cervical 06/06/2014   Dyslipidemia 03/17/2014   Essential hypertension 03/17/2014   Diabetic neuropathy, painful ([place]) 03/21/2013   A C DEGENERATION-CHRONIC 10/27/2008   IMPINGEMENT SYNDROME 05/01/2008    Allergies:  Allergies  Allergen Reactions   Tomato Itching and Rash   Medications:  Current Outpatient Medications:    aspirin EC 81 MG tablet, Take 1 tablet (81 mg total) by mouth daily., Disp: 30 tablet, Rfl: 5   Blood  Glucose Monitoring Suppl (TRUE METRIX METER) w/Device KIT, USE AS DIRECTED (Patient taking differently: 1 each by Other route as directed.), Disp: 1 kit, Rfl: 0   Continuous Blood Gluc Receiver ([place]) DEVI, Use to check blood sugar three times daily. ., Disp: 1 each, Rfl: 0   Continuous Blood Gluc Sensor (DEXCOM G6 SENSOR) MISC, Use to check blood sugar three times daily. Change sensors once every 10  days. Dx code E11.42, Disp: 3 each, Rfl: 2   Continuous Blood Gluc Transmit (DEXCOM G6 TRANSMITTER) MISC, Use to check blood sugar three times daily. Change transmitter once every 90 days., Disp: 1 each, Rfl: 1   dapagliflozin propanediol (FARXIGA) 10 MG TABS tablet, Take 1 tablet (10 mg total) by mouth daily before breakfast., Disp: 30 tablet, Rfl: 6   Dulaglutide (TRULICITY) 3 VU/0.2BX SOPN, Inject 3 mg once a week as directed., Disp: 6 mL, Rfl: 1   DULoxetine (CYMBALTA) 60 MG capsule, Take 1 capsule (60 mg total) by mouth daily., Disp: 30 capsule, Rfl: 3   ergocalciferol (DRISDOL) 1.25 MG (50000 UT) capsule, Take 1 capsule (50,000 Units total) by mouth once a week., Disp: 12 capsule, Rfl: 1   gabapentin (NEURONTIN) 300 MG capsule, Take 2 capsules (600 mg total) by mouth 3 (three) times daily., Disp: 120 capsule, Rfl: 6   glucose blood (TRUE METRIX BLOOD GLUCOSE TEST) test strip, Use as instructed (Patient taking differently: 1 each by Other route as directed.), Disp: 100 each, Rfl: 12   insulin glargine-yfgn (SEMGLEE, YFGN,) 100 UNIT/ML Pen, Inject twice daily 36 units in the morning and 30 units in the evening, Disp: 30 mL, Rfl: 3   insulin lispro (HUMALOG KWIKPEN) 100 UNIT/ML KwikPen, Use 0-12 units per sliding scale three times daily with meals., Disp: 15 mL, Rfl: 2   Insulin Pen Needle (UNIFINE PENTIPS) 32G X 4 MM MISC, Use as directed with insulin and victoza, Disp: 100 each, Rfl: 0   linaclotide (LINZESS) 72 MCG capsule, Take 1 capsule (72 mcg total) by mouth daily before breakfast., Disp: 30 capsule, Rfl: 3   lisinopril-hydrochlorothiazide (ZESTORETIC) 20-12.5 MG tablet, TAKE 2 TABLETS BY MOUTH ONCE DAILY., Disp: 180 tablet, Rfl: 1   metFORMIN (GLUCOPHAGE) 1000 MG tablet, Take 1 tablet by mouth 2 (two) times daily with a meal., Disp: 180 tablet, Rfl: 1   metoprolol succinate (TOPROL-XL) 100 MG 24 hr tablet, Take 1 tablet by mouth daily. Take with or immediately following a meal., Disp: 90  tablet, Rfl: 3   rosuvastatin (CRESTOR) 20 MG tablet, Take 1 tablet (20 mg total) by mouth daily., Disp: 90 tablet, Rfl: 1   TRUEplus Lancets 28G MISC, USE AS INSTRUCTED (Patient taking differently: 1 each by Other route as directed.), Disp: 100 each, Rfl: 5  Observations/Objective: Patient is well-developed, well-nourished in no acute distress.  Resting at home.  Head is normocephalic, atraumatic.  No labored breathing.  Speech is clear and coherent with logical content.  Patient is alert and oriented at baseline.    Assessment and Plan: There are no diagnoses linked to this encounter.  - Recommend referral to UC or ED for probable incision and drainage of abscess.  Unlikely to drain completely on its own. - Doxy (denies pregnancy or breastfeeding)   Follow Up Instructions: I discussed the assessment and treatment plan with the patient. The patient was provided an opportunity to ask questions and all were answered. The patient agreed with the plan and demonstrated an understanding of the instructions.  A copy  of instructions were sent to the patient via MyChart unless otherwise noted below.     The patient was advised to call back or seek an in-person evaluation if the symptoms worsen or if the condition fails to improve as anticipated.  Time:  I spent 11 minutes with the patient via telehealth technology discussing the above problems/concerns.    Montine Circle, PA-C

## 2021-11-24 NOTE — Patient Instructions (Signed)
I recommend that you go to an urgent care or emergency department for evaluation today as this abscess may require further drainage with incision.    If you are having a true medical emergency please call 911.      For an urgent face to face visit, [facility] has seven urgent care centers for your convenience:     [facility] [place] [facility] [place] Get Driving Directions [phone] [place], [place] [address]    [facility]) Get Driving Directions [phone] [place], [place] [address]  [facility] [place] ([place]) Get Driving Directions [phone] [address] [place],  [place]  [address]  [facility] - at [address] Get Driving Directions  [phone] [address] [place],  [place] [address]   [facility] Get Driving Directions [phone] [place], [place], [place] [address]   [facility] Get Driving Directions  [phone] [address] [place], [place] [address]    Urgent Care at [place] Get Driving Directions [phone] [address] [place], [place] [address]

## 2021-11-25 ENCOUNTER — Other Ambulatory Visit: Payer: Self-pay

## 2021-11-25 ENCOUNTER — Ambulatory Visit (HOSPITAL_COMMUNITY)
Admission: EM | Admit: 2021-11-25 | Discharge: 2021-11-25 | Disposition: A | Discharge status: Home / Self Care | Payer: Managed Care, Other (non HMO) | Attending: Physician Assistant | Admitting: Physician Assistant

## 2021-11-25 ENCOUNTER — Encounter (HOSPITAL_COMMUNITY): Payer: Self-pay

## 2021-11-25 DIAGNOSIS — L0231 Cutaneous abscess of buttock: Secondary | ICD-10-CM | POA: Diagnosis not present

## 2021-11-25 DIAGNOSIS — K6289 Other specified diseases of anus and rectum: Secondary | ICD-10-CM

## 2021-11-25 MED ORDER — IBUPROFEN 600 MG PO TABS
600.0000 mg | ORAL_TABLET | Freq: Three times a day (TID) | ORAL | 0 refills | Status: DC | PRN
  Filled 2021-11-25: qty 30, 10d supply, fill #0

## 2021-11-25 MED ORDER — SULFAMETHOXAZOLE-TRIMETHOPRIM 800-160 MG PO TABS
1.0000 | ORAL_TABLET | Freq: Two times a day (BID) | ORAL | 0 refills | Status: AC
  Filled 2021-11-25: qty 14, 7d supply, fill #0

## 2021-11-25 NOTE — Discharge Instructions (Addendum)
Advised to take the Bactrim DS every 12 hours until completed. Advised to continue with frequent warm compresses to the abscess area. Advised to return in 74 to 72 hours if the abscess is soft to see about having it drained.

## 2021-11-25 NOTE — ED Triage Notes (Signed)
Patient has an abscess on the left buttocks. First noticed Saturday and worse on Sunday. Patient has been doing warm soaks and warm compress, this did cause it to drain some but not go away.   Patient is diabetic.

## 2021-11-25 NOTE — ED Provider Notes (Signed)
[facility]    CSN: [id number] Arrival date & time: 11/25/21  9622      History   Chief Complaint Chief Complaint  Patient presents with   Abscess    buttocks    HPI Mariah Miller is a 46 y.o. female.   46 year old female presents with a left gluteal abscess.  Patient indicates that she has a history of having abscesses occur intermittently, she has been hospitalized for 2 prior abscess formations in the past.  Patient relates she is a diabetic, she is on Trulicity, and this has reduced her glucose levels from a hemoglobin A1c of 12-7.9 over the past couple months.  Patient indicates for the past several days she has a left upper gluteal abscess formation that has become tender, swollen, that is draining intermittently.  She continues to use warm compresses frequently on a regular basis which continues to allow the drainage to occur.  She indicates it is just not draining enough to relieve the pressure and the pain.  She denies any fever or chills.   Abscess   Past Medical History:  Diagnosis Date   Depression    Diabetes mellitus without complication ([place])    Hypertension    Migraine    Seizures ([place])    self reported - last seizure was Oct 2019 "absent seizure"    Patient Active Problem List   Diagnosis Date Noted   Acute recurrent maxillary sinusitis 03/17/2021   Pain in joint of left knee 12/14/2020   Migraine 10/01/2020   Eczema 05/26/2017   Primary insomnia 09/28/2016   Anxiety and depression 05/26/2016   OSA (obstructive sleep apnea) 04/30/2015   Uncontrolled type 2 diabetes mellitus with hyperglycemia, with long-term current use of insulin ([place]) 04/28/2015   ASCUS with positive high risk HPV cervical 06/06/2014   Dyslipidemia 03/17/2014   Essential hypertension 03/17/2014   Diabetic neuropathy, painful ([place]) 03/21/2013   A C DEGENERATION-CHRONIC 10/27/2008   IMPINGEMENT SYNDROME 05/01/2008    Past Surgical History:  Procedure Laterality  Date   CHOLECYSTECTOMY     CYST EXCISION     SHOULDER OPEN ROTATOR CUFF REPAIR Left    TONSILLECTOMY     TUBAL LIGATION      OB History     Gravida  6   Para  3   Term  3   Preterm      AB  3   Living  3      SAB  2   IAB      Ectopic      Multiple      Live Births  3            Home Medications    Prior to Admission medications   Medication Sig Start Date End Date Taking? Authorizing Provider  aspirin EC 81 MG tablet Take 1 tablet (81 mg total) by mouth daily. 01/22/13  Yes Dhungel, Nishant, MD  dapagliflozin propanediol (FARXIGA) 10 MG TABS tablet Take 1 tablet (10 mg total) by mouth daily before breakfast. 08/26/20  Yes McClung, Angela M, PA-C  Dulaglutide (TRULICITY) 3 WL/7.9GX SOPN Inject 3 mg once a week as directed. 06/24/21  Yes Charlott Rakes, MD  DULoxetine (CYMBALTA) 60 MG capsule Take 1 capsule (60 mg total) by mouth daily. 09/20/21  Yes Charlott Rakes, MD  ergocalciferol (DRISDOL) 1.25 MG (50000 UT) capsule Take 1 capsule (50,000 Units total) by mouth once a week. 09/21/21  Yes Charlott Rakes, MD  gabapentin (NEURONTIN) 300 MG capsule  Take 2 capsules (600 mg total) by mouth 3 (three) times daily. 10/01/20  Yes Charlott Rakes, MD  ibuprofen (ADVIL) 600 MG tablet Take 1 tablet (600 mg total) by mouth every 8 (eight) hours as needed. 11/25/21  Yes Nyoka Lint, PA-C  insulin glargine-yfgn (SEMGLEE, YFGN,) 100 UNIT/ML Pen Inject twice daily 36 units in the morning and 30 units in the evening 09/20/21  Yes Charlott Rakes, MD  linaclotide (LINZESS) 72 MCG capsule Take 1 capsule (72 mcg total) by mouth daily before breakfast. 09/20/21  Yes Newlin, Enobong, MD  lisinopril-hydrochlorothiazide (ZESTORETIC) 20-12.5 MG tablet TAKE 2 TABLETS BY MOUTH ONCE DAILY. 05/10/21  Yes Charlott Rakes, MD  metFORMIN (GLUCOPHAGE) 1000 MG tablet Take 1 tablet by mouth 2 (two) times daily with a meal. 10/01/20  Yes Newlin, Enobong, MD  metoprolol succinate (TOPROL-XL) 100 MG 24 hr  tablet Take 1 tablet by mouth daily. Take with or immediately following a meal. 12/14/20 12/09/21 Yes Troy Sine, MD  rosuvastatin (CRESTOR) 20 MG tablet Take 1 tablet (20 mg total) by mouth daily. 09/21/21  Yes Charlott Rakes, MD  sulfamethoxazole-trimethoprim (BACTRIM DS) 800-160 MG tablet Take 1 tablet by mouth 2 (two) times daily for 7 days. 11/25/21 12/02/21 Yes Nyoka Lint, PA-C  Blood Glucose Monitoring Suppl (TRUE METRIX METER) w/Device KIT USE AS DIRECTED Patient taking differently: 1 each by Other route as directed. 05/16/18   Charlott Rakes, MD  Continuous Blood Gluc Receiver (DEXCOM G6 RECEIVER) DEVI Use to check blood sugar three times daily. . 09/20/21   Charlott Rakes, MD  Continuous Blood Gluc Sensor (DEXCOM G6 SENSOR) MISC Use to check blood sugar three times daily. Change sensors once every 10 days. Dx code E11.42 09/20/21   Charlott Rakes, MD  Continuous Blood Gluc Transmit (DEXCOM G6 TRANSMITTER) MISC Use to check blood sugar three times daily. Change transmitter once every 90 days. 09/20/21   Charlott Rakes, MD  doxycycline (VIBRAMYCIN) 100 MG capsule Take 1 capsule (100 mg total) by mouth 2 (two) times daily. 11/24/21   Montine Circle, PA-C  glucose blood (TRUE METRIX BLOOD GLUCOSE TEST) test strip Use as instructed Patient taking differently: 1 each by Other route as directed. 05/16/18   Charlott Rakes, MD  Insulin Pen Needle (UNIFINE PENTIPS) 32G X 4 MM MISC Use as directed with insulin and victoza 03/25/21 03/25/22  Charlott Rakes, MD  TRUEplus Lancets 28G MISC USE AS INSTRUCTED Patient taking differently: 1 each by Other route as directed. 05/16/18   Charlott Rakes, MD    Family History Family History  Problem Relation Age of Onset   Hypertension Mother    Thyroid disease Mother    Irregular heart beat Mother    Bipolar disorder Sister    Colon cancer Other    Esophageal cancer Neg Hx    Rectal cancer Neg Hx    Stomach cancer Neg Hx     Social  History Social History   Tobacco Use   Smoking status: Former    Types: Cigarettes   Smokeless tobacco: Never   Tobacco comments:    Smoked for 4 years, quit age 37  Vaping Use   Vaping Use: Never used  Substance Use Topics   Alcohol use: Yes   Drug use: No     Allergies   Tomato   Review of Systems Review of Systems  Skin:  Positive for wound (left gluteal abscess).     Physical Exam Triage Vital Signs ED Triage Vitals  Enc Vitals Group  BP 11/25/21 0949 (!) 150/79     Pulse Rate 11/25/21 0949 100     Resp 11/25/21 0949 16     Temp 11/25/21 0949 98.2 F (36.8 C)     Temp Source 11/25/21 0949 Oral     SpO2 11/25/21 0949 100 %     Weight --      Height --      Head Circumference --      Peak Flow --      Pain Score 11/25/21 0950 7     Pain Loc --      Pain Edu? --      Excl. in [place]? --    No data found.  Updated Vital Signs BP (!) 150/79 (BP Location: Right Arm)   Pulse 100   Temp 98.2 F (36.8 C) (Oral)   Resp 16   LMP 11/23/2021 (Exact Date)   SpO2 100%   Visual Acuity Right Eye Distance:   Left Eye Distance:   Bilateral Distance:    Right Eye Near:   Left Eye Near:    Bilateral Near:     Physical Exam Constitutional:      Appearance: Normal appearance.  Skin:         Comments: Left gluteal area.  Upper aspect has a 3 x 3 cm abscess present that is solid.  There is pointing and there is mild drainage at the apex of the point.  There is no fluctuance besides being right at the abscess apex.  There is no swelling, or redness streaking from the area. It is too early to do an I&D of this area due to the greatest majority of it being solid and not liquid in content.  Neurological:     Mental Status: She is alert.      UC Treatments / Results  Labs (all labs ordered are listed, but only abnormal results are displayed) Labs Reviewed - No data to display  EKG   Radiology No results found.  Procedures Procedures (including  critical care time)  Medications Ordered in UC Medications - No data to display  Initial Impression / Assessment and Plan / UC Course  I have reviewed the triage vital signs and the nursing notes.  Pertinent labs & imaging results that were available during my care of the patient were reviewed by me and considered in my medical decision making (see chart for details).       Plan: 1.  Advised to continue warm compresses frequently to help abscess formation liquefy. 2.  Advised take Bactrim DS every 12 hours until completed. 3.  Patient has been instructed to return to urgent care once the abscess area becomes more soft and liquefied, and then perform I&D. 4.  Follow-up with PCP as directed. Final Clinical Impressions(s) / UC Diagnoses   Final diagnoses:  Abscess of buttock, left  Rectal pain     Discharge Instructions      Advised to take the Bactrim DS every 12 hours until completed. Advised to continue with frequent warm compresses to the abscess area. Advised to return in 92 to 72 hours if the abscess is soft to see about having it drained.      ED Prescriptions     Medication Sig Dispense Auth. Provider   ibuprofen (ADVIL) 600 MG tablet Take 1 tablet (600 mg total) by mouth every 8 (eight) hours as needed. 30 tablet Nyoka Lint, PA-C   sulfamethoxazole-trimethoprim (BACTRIM DS) 800-160 MG tablet Take 1 tablet by  mouth 2 (two) times daily for 7 days. 14 tablet Nyoka Lint, PA-C      PDMP not reviewed this encounter.   Nyoka Lint, PA-C 11/25/21 1050

## 2021-12-01 ENCOUNTER — Other Ambulatory Visit: Payer: Self-pay

## 2021-12-01 ENCOUNTER — Ambulatory Visit (AMBULATORY_SURGERY_CENTER): Payer: Self-pay | Admitting: *Deleted

## 2021-12-01 VITALS — Ht 65.0 in | Wt 211.0 lb

## 2021-12-01 DIAGNOSIS — Z1211 Encounter for screening for malignant neoplasm of colon: Secondary | ICD-10-CM

## 2021-12-01 MED ORDER — PEG 3350-KCL-NA BICARB-NACL 420 G PO SOLR
4000.0000 mL | Freq: Once | ORAL | 0 refills | Status: AC
  Filled 2021-12-01: qty 4000, 1d supply, fill #0

## 2021-12-01 NOTE — Progress Notes (Signed)
No egg or soy allergy known to patient  No issues known to pt with past sedation with any surgeries or procedures Patient denies ever being told they had issues or difficulty with intubation  No FH of Malignant Hyperthermia Pt is not on diet pills Pt is not on  home 02  Pt is not on blood thinners  Pt denies issues with constipation  No A fib or A flutter Have any cardiac testing pending--no Pt instructed to use [email or web address] or GoodRx for a price reduction on prep   

## 2021-12-03 ENCOUNTER — Other Ambulatory Visit: Payer: Self-pay

## 2021-12-12 ENCOUNTER — Encounter: Payer: Self-pay | Admitting: Certified Registered Nurse Anesthetist

## 2021-12-13 ENCOUNTER — Ambulatory Visit: Payer: Managed Care, Other (non HMO) | Admitting: Cardiovascular Disease

## 2021-12-16 ENCOUNTER — Encounter: Payer: Self-pay | Admitting: Gastroenterology

## 2021-12-16 ENCOUNTER — Ambulatory Visit (AMBULATORY_SURGERY_CENTER): Payer: Managed Care, Other (non HMO) | Admitting: Gastroenterology

## 2021-12-16 VITALS — BP 158/74 | HR 70 | Temp 98.0°F | Resp 12 | Ht 65.0 in | Wt 211.0 lb

## 2021-12-16 DIAGNOSIS — Z1211 Encounter for screening for malignant neoplasm of colon: Secondary | ICD-10-CM | POA: Diagnosis present

## 2021-12-16 MED ORDER — SODIUM CHLORIDE 0.9 % IV SOLN: 500.0000 mL | Freq: Once | INTRAVENOUS | Status: DC

## 2021-12-16 NOTE — Progress Notes (Signed)
Report given to PACU, vss 

## 2021-12-16 NOTE — Op Note (Signed)
[facility] Patient Name: Mariah Miller Procedure Date: 12/16/2021 9:06 AM MRN: [id number] Endoscopist: Thornton Park MD, MD Age: 46 Referring MD:  Date of Birth: April 26, 1975 Gender: Female Account #: [id number] Procedure:                Colonoscopy Indications:              Screening for colorectal malignant neoplasm, This                            is the patient's first colonoscopy Medicines:                Monitored Anesthesia Care Procedure:                Pre-Anesthesia Assessment:                           - Prior to the procedure, a History and Physical                            was performed, and patient medications and                            allergies were reviewed. The patient's tolerance of                            previous anesthesia was also reviewed. The risks                            and benefits of the procedure and the sedation                            options and risks were discussed with the patient.                            All questions were answered, and informed consent                            was obtained. Prior Anticoagulants: The patient has                            taken no previous anticoagulant or antiplatelet                            agents. ASA Grade Assessment: II - A patient with                            mild systemic disease. After reviewing the risks                            and benefits, the patient was deemed in                            satisfactory condition to undergo the procedure.  After obtaining informed consent, the colonoscope                            was passed under direct vision. Throughout the                            procedure, the patient's blood pressure, pulse, and                            oxygen saturations were monitored continuously. The                            CF [id number] #[id number] was introduced through the anus                            and advanced to  the 3 cm into the ileum. A second                            forward view of the right colon was performed. The                            colonoscopy was performed without difficulty. The                            patient tolerated the procedure well. The quality                            of the bowel preparation was good. The terminal                            ileum, ileocecal valve, appendiceal orifice, and                            rectum were photographed. Scope In: 9:13:48 AM Scope Out: 9:27:24 AM Scope Withdrawal Time: 0 hours 10 minutes 5 seconds  Total Procedure Duration: 0 hours 13 minutes 36 seconds  Findings:                 The perianal and digital rectal examinations were                            normal.                           The entire examined colon appeared normal on direct                            and retroflexion views. Complications:            No immediate complications. Estimated Blood Loss:     Estimated blood loss: none. Impression:               - The entire examined colon is normal on direct and  retroflexion views.                           - No specimens collected. Recommendation:           - Patient has a contact number available for                            emergencies. The signs and symptoms of potential                            delayed complications were discussed with the                            patient. Return to normal activities tomorrow.                            Written discharge instructions were provided to the                            patient.                           - Resume previous diet.                           - Continue present medications.                           - Repeat colonoscopy in 10 years for surveillance,                            earlier with new symptoms.                           - Emerging evidence supports eating a diet of                            fruits, vegetables,  grains, calcium, and yogurt                            while reducing red meat and alcohol may reduce the                            risk of colon cancer.                           - Thank you for allowing me to be involved in your                            colon cancer prevention. Thornton Park MD, MD 12/16/2021 9:32:48 AM This report has been signed electronically.

## 2021-12-16 NOTE — Progress Notes (Signed)
Referring Provider: Charlott Rakes, MD Primary Care Physician:  Charlott Rakes, MD  Indication for Colonoscopy:  Colon cancer screening   IMPRESSION:  Need for colon cancer screening Appropriate candidate for monitored anesthesia care  PLAN: Colonoscopy in the [place] today   HPI: Mariah Miller is a 46 y.o. female presents for screening colonoscopy.    Past Medical History:  Diagnosis Date   Depression    Diabetes mellitus without complication ([place])    Hyperlipidemia    Hypertension    Migraine    Seizures ([place])    self reported - last seizure was Oct 2019 "absent seizure"    Past Surgical History:  Procedure Laterality Date   CHOLECYSTECTOMY     CYST EXCISION     SHOULDER OPEN ROTATOR CUFF REPAIR Left    TONSILLECTOMY     TUBAL LIGATION      Current Outpatient Medications  Medication Sig Dispense Refill   aspirin EC 81 MG tablet Take 1 tablet (81 mg total) by mouth daily. 30 tablet 5   Blood Glucose Monitoring Suppl (TRUE METRIX METER) w/Device KIT USE AS DIRECTED (Patient taking differently: 1 each by Other route as directed.) 1 kit 0   dapagliflozin propanediol (FARXIGA) 10 MG TABS tablet Take 1 tablet (10 mg total) by mouth daily before breakfast. 30 tablet 6   Dulaglutide (TRULICITY) 3 DP/8.2UM SOPN Inject 3 mg once a week as directed. 6 mL 1   DULoxetine (CYMBALTA) 60 MG capsule Take 1 capsule (60 mg total) by mouth daily. 30 capsule 3   ergocalciferol (DRISDOL) 1.25 MG (50000 UT) capsule Take 1 capsule (50,000 Units total) by mouth once a week. 12 capsule 1   gabapentin (NEURONTIN) 300 MG capsule Take 2 capsules (600 mg total) by mouth 3 (three) times daily. 120 capsule 6   glucose blood (TRUE METRIX BLOOD GLUCOSE TEST) test strip Use as instructed (Patient taking differently: 1 each by Other route as directed.) 100 each 12   insulin glargine-yfgn (SEMGLEE, YFGN,) 100 UNIT/ML Pen Inject twice daily 36 units in the morning and 30 units in the evening 30 mL  3   Insulin Pen Needle (UNIFINE PENTIPS) 32G X 4 MM MISC Use as directed with insulin and victoza 100 each 0   linaclotide (LINZESS) 72 MCG capsule Take 1 capsule (72 mcg total) by mouth daily before breakfast. 30 capsule 3   lisinopril-hydrochlorothiazide (ZESTORETIC) 20-12.5 MG tablet TAKE 2 TABLETS BY MOUTH ONCE DAILY. 180 tablet 1   metFORMIN (GLUCOPHAGE) 1000 MG tablet Take 1 tablet by mouth 2 (two) times daily with a meal. 180 tablet 1   rosuvastatin (CRESTOR) 20 MG tablet Take 1 tablet (20 mg total) by mouth daily. 90 tablet 1   TRUEplus Lancets 28G MISC USE AS INSTRUCTED (Patient taking differently: 1 each by Other route as directed.) 100 each 5   Continuous Blood Gluc Receiver (DEXCOM G6 RECEIVER) DEVI Use to check blood sugar three times daily. . (Patient not taking: Reported on 12/16/2021) 1 each 0   Continuous Blood Gluc Sensor (DEXCOM G6 SENSOR) MISC Use to check blood sugar three times daily. Change sensors once every 10 days. Dx code E11.42 (Patient not taking: Reported on 12/16/2021) 3 each 2   Continuous Blood Gluc Transmit (DEXCOM G6 TRANSMITTER) MISC Use to check blood sugar three times daily. Change transmitter once every 90 days. (Patient not taking: Reported on 12/16/2021) 1 each 1   doxycycline (VIBRAMYCIN) 100 MG capsule Take 1 capsule (100 mg total) by mouth  2 (two) times daily. (Patient not taking: Reported on 12/16/2021) 20 capsule 0   ibuprofen (ADVIL) 600 MG tablet Take 1 tablet (600 mg total) by mouth every 8 (eight) hours as needed. (Patient not taking: Reported on 12/16/2021) 30 tablet 0   insulin lispro (HUMALOG) 100 UNIT/ML injection Inject 1 Units into the skin as needed for high blood sugar. Sliding scale PRN (Patient not taking: Reported on 12/16/2021)     metoprolol succinate (TOPROL-XL) 100 MG 24 hr tablet Take 1 tablet by mouth daily. Take with or immediately following a meal. 90 tablet 3   Current Facility-Administered Medications  Medication Dose Route  Frequency Provider Last Rate Last Admin   0.9 %  sodium chloride infusion  500 mL Intravenous Once Thornton Park, MD        Allergies as of 12/16/2021 - Review Complete 12/16/2021  Allergen Reaction Noted   Tomato Itching and Rash 08/29/2015    Family History  Problem Relation Age of Onset   Hypertension Mother    Thyroid disease Mother    Irregular heart beat Mother    Bipolar disorder Sister    Colon cancer Other        great uncle   Esophageal cancer Neg Hx    Rectal cancer Neg Hx    Stomach cancer Neg Hx      Physical Exam: General:   Alert,  well-nourished, pleasant and cooperative in NAD Head:  Normocephalic and atraumatic. Eyes:  Sclera clear, no icterus.   Conjunctiva pink. Mouth:  No deformity or lesions.   Neck:  Supple; no masses or thyromegaly. Lungs:  Clear throughout to auscultation.   No wheezes. Heart:  Regular rate and rhythm; no murmurs. Abdomen:  Soft, non-tender, nondistended, normal bowel sounds, no rebound or guarding.  Msk:  Symmetrical. No boney deformities LAD: No inguinal or umbilical LAD Extremities:  No clubbing or edema. Neurologic:  Alert and  oriented x4;  grossly nonfocal Skin:  No obvious rash or bruise. Psych:  Alert and cooperative. Normal mood and affect.     Studies/Results: No results found.    Kimberly L. Tarri Glenn, MD, MPH 12/16/2021, 9:01 AM

## 2021-12-16 NOTE — Patient Instructions (Signed)
YOU HAD AN ENDOSCOPIC PROCEDURE TODAY AT THE [facility]:   Refer to the procedure report that was given to you for any specific questions about what was found during the examination.  If the procedure report does not answer your questions, please call your gastroenterologist to clarify.  If you requested that your care partner not be given the details of your procedure findings, then the procedure report has been included in a sealed envelope for you to review at your convenience later.  YOU SHOULD EXPECT: Some feelings of bloating in the abdomen. Passage of more gas than usual.  Walking can help get rid of the air that was put into your GI tract during the procedure and reduce the bloating. If you had a lower endoscopy (such as a colonoscopy or flexible sigmoidoscopy) you may notice spotting of blood in your stool or on the toilet paper. If you underwent a bowel prep for your procedure, you may not have a normal bowel movement for a few days.  Please Note:  You might notice some irritation and congestion in your nose or some drainage.  This is from the oxygen used during your procedure.  There is no need for concern and it should clear up in a day or so.  SYMPTOMS TO REPORT IMMEDIATELY:  Following lower endoscopy (colonoscopy or flexible sigmoidoscopy):  Excessive amounts of blood in the stool  Significant tenderness or worsening of abdominal pains  Swelling of the abdomen that is new, acute  Fever of 100F or higher  For urgent or emergent issues, a gastroenterologist can be reached at any hour by calling [phone]. Do not use MyChart messaging for urgent concerns.    DIET:  We do recommend a small meal at first, but then you may proceed to your regular diet.  Drink plenty of fluids but you should avoid alcoholic beverages for 24 hours.  ACTIVITY:  You should plan to take it easy for the rest of today and you should NOT DRIVE or use heavy machinery until tomorrow (because of  the sedation medicines used during the test).    FOLLOW UP: Our staff will call the number listed on your records the next business day following your procedure.  We will call around 7:15- 8:00 am to check on you and address any questions or concerns that you may have regarding the information given to you following your procedure. If we do not reach you, we will leave a message.      SIGNATURES/CONFIDENTIALITY: You and/or your care partner have signed paperwork which will be entered into your electronic medical record.  These signatures attest to the fact that that the information above on your After Visit Summary has been reviewed and is understood.  Full responsibility of the confidentiality of this discharge information lies with you and/or your care-partner. 

## 2021-12-17 ENCOUNTER — Telehealth: Payer: Self-pay | Admitting: *Deleted

## 2021-12-17 NOTE — Telephone Encounter (Signed)
  Follow up Call-     12/16/2021    8:35 AM  Call back number  Post procedure Call Back phone  # [phone]  Permission to leave phone message Yes     Patient questions:   Message left to call if necessary.

## 2021-12-23 ENCOUNTER — Other Ambulatory Visit: Payer: Self-pay

## 2022-01-10 ENCOUNTER — Ambulatory Visit: Payer: Managed Care, Other (non HMO) | Admitting: Family Medicine

## 2022-01-11 ENCOUNTER — Ambulatory Visit: Payer: Managed Care, Other (non HMO) | Admitting: Family Medicine

## 2022-02-03 IMAGING — CR DG ABDOMEN 2V
2 series · 2 of 2 positions shown · non-contrast
Comparison: Abdominal radiographs 06/17/2010

CLINICAL DATA: Constipation, emesis, left-sided abdominal pain

EXAM:
ABDOMEN - 2 VIEW

[abdomen erect]
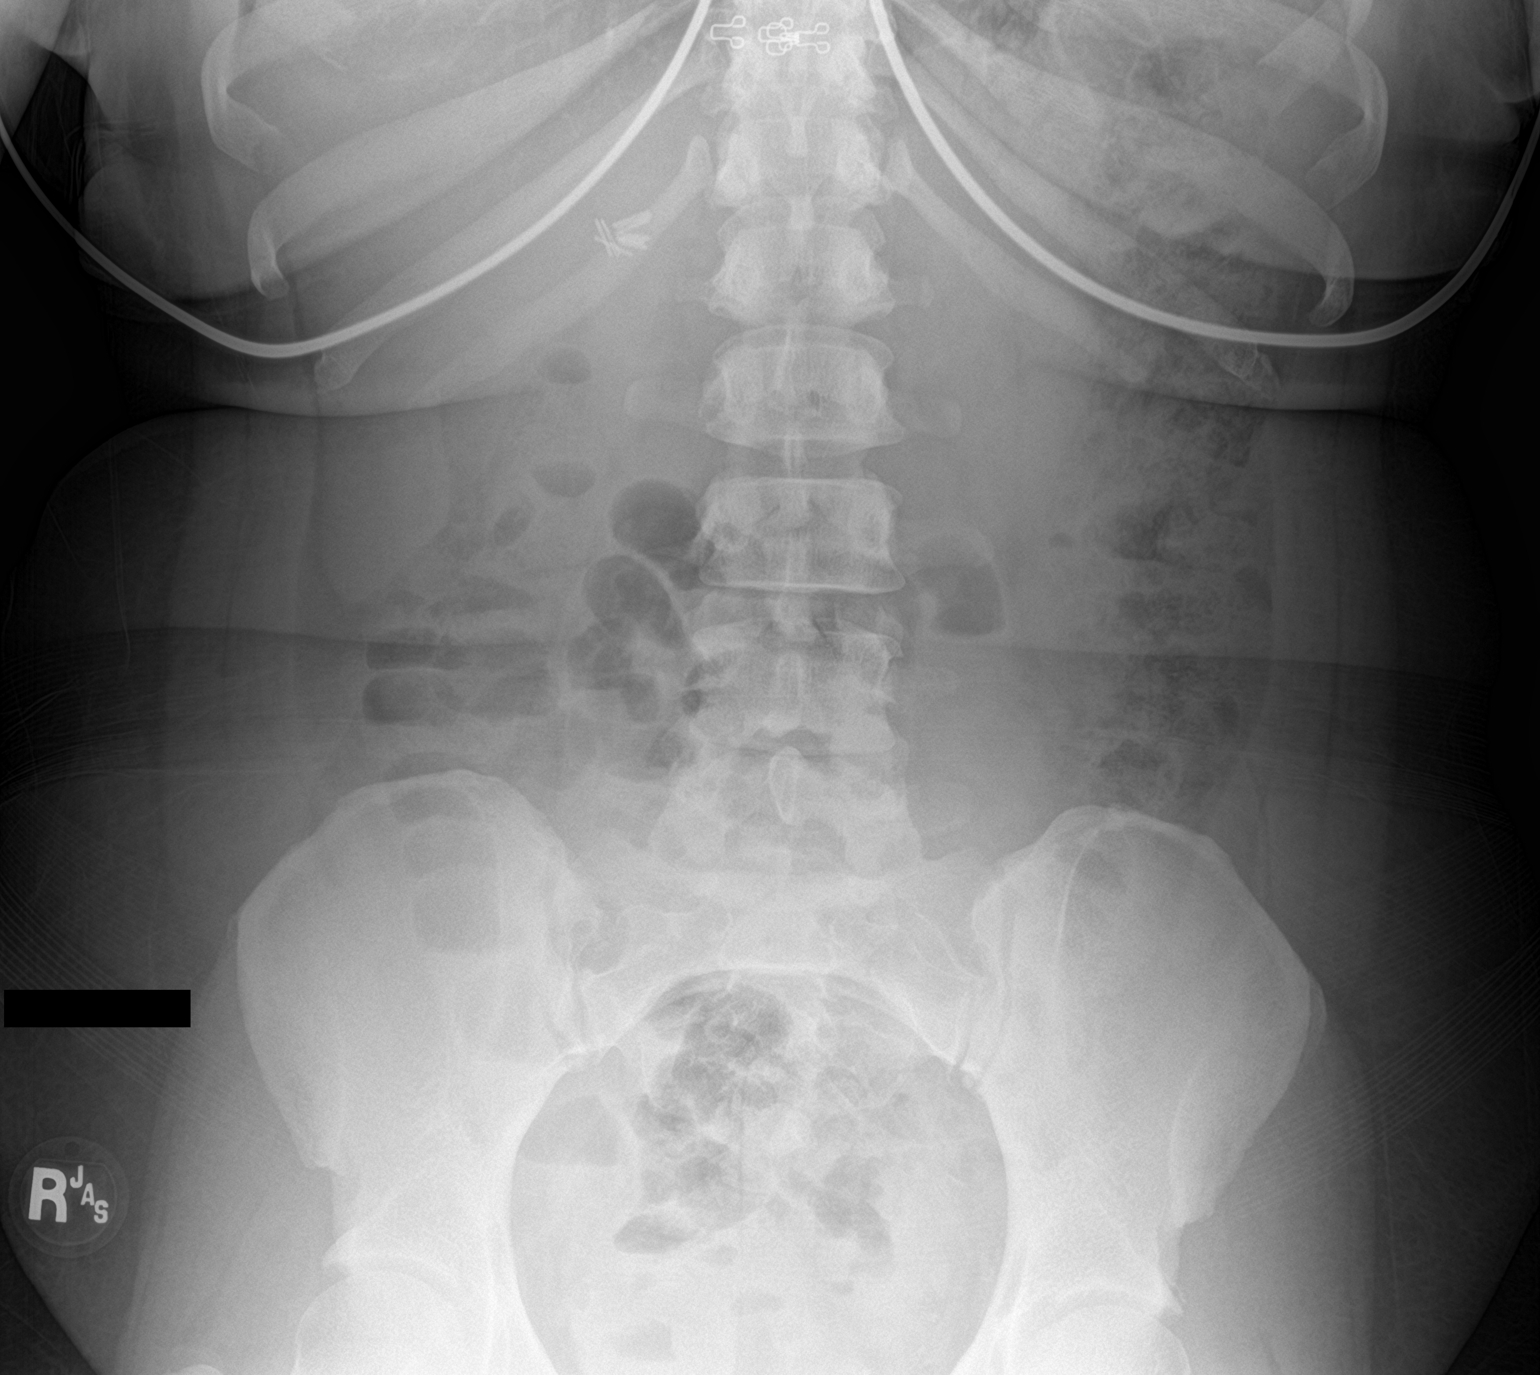

[abdomen supine]
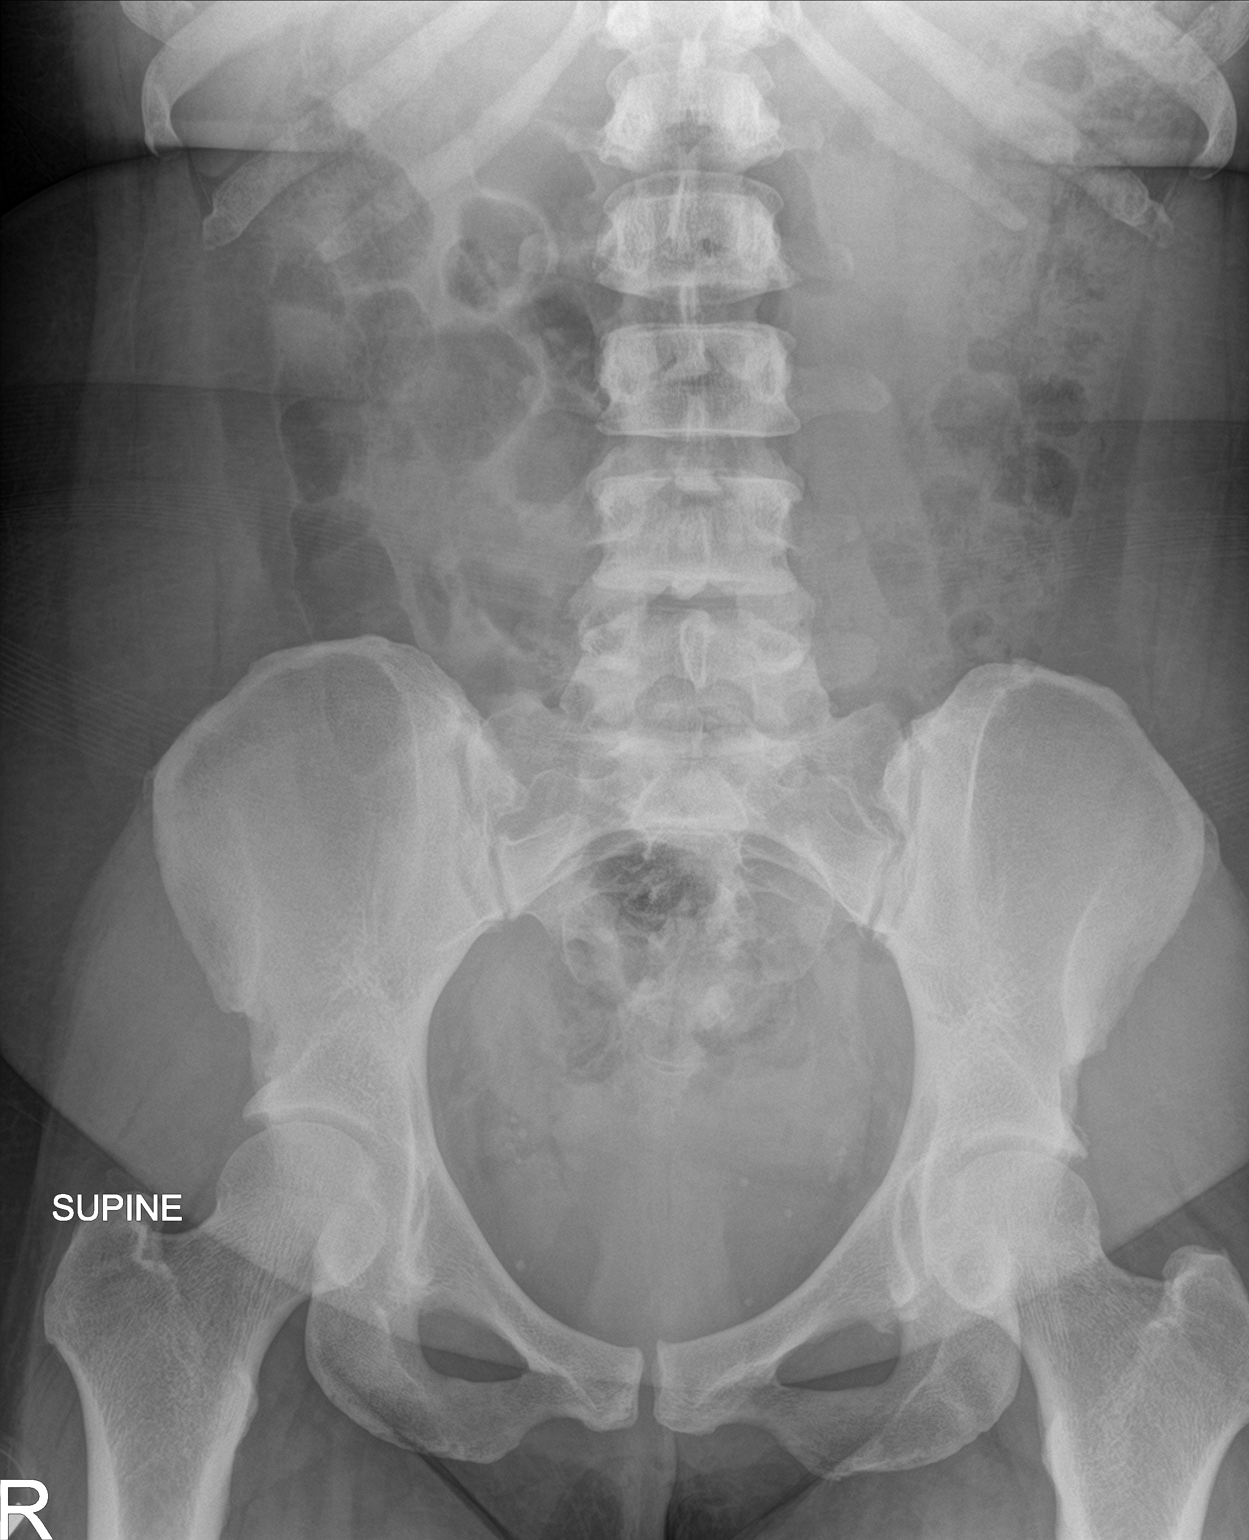

[2 of 2 positions shown; findings below may reference images not displayed]

FINDINGS: There is a nonobstructive bowel gas pattern. There is a moderate
stool burden projecting over the rectum and left hemiabdomen.

There is no gross organomegaly or abnormal soft tissue
calcification. Right upper quadrant surgical clips are noted. The
bones are unremarkable.
IMPRESSION: Moderate colonic stool burden projecting over the rectum and left
hemiabdomen without evidence of mechanical obstruction.

## 2022-03-07 ENCOUNTER — Other Ambulatory Visit: Payer: Self-pay | Admitting: Family Medicine

## 2022-03-07 ENCOUNTER — Other Ambulatory Visit: Payer: Self-pay | Admitting: Cardiovascular Disease

## 2022-03-07 ENCOUNTER — Other Ambulatory Visit: Payer: Self-pay | Admitting: Physician Assistant

## 2022-03-07 DIAGNOSIS — E1142 Type 2 diabetes mellitus with diabetic polyneuropathy: Secondary | ICD-10-CM

## 2022-03-08 ENCOUNTER — Other Ambulatory Visit: Payer: Self-pay

## 2022-03-08 MED ORDER — METOPROLOL SUCCINATE ER 100 MG PO TB24
100.0000 mg | ORAL_TABLET | Freq: Every day | ORAL | 0 refills | Status: DC
  Filled 2022-03-08: qty 30, 30d supply, fill #0
  Filled 2022-04-11 – 2022-05-22 (×2): qty 30, 30d supply, fill #1

## 2022-03-08 MED ORDER — INSULIN LISPRO 100 UNIT/ML IJ SOLN
1.0000 [IU] | INTRAMUSCULAR | 3 refills | Status: AC | PRN
  Filled 2022-03-08: qty 10, 28d supply, fill #0
  Filled 2022-05-23: qty 10, 28d supply, fill #1
  Filled 2023-01-31: qty 10, 28d supply, fill #2

## 2022-03-09 ENCOUNTER — Other Ambulatory Visit: Payer: Self-pay

## 2022-03-09 MED ORDER — METFORMIN HCL 1000 MG PO TABS
1000.0000 mg | ORAL_TABLET | Freq: Two times a day (BID) | ORAL | 0 refills | Status: DC
  Filled 2022-03-09: qty 60, 30d supply, fill #0
  Filled 2022-04-11 – 2022-05-23 (×4): qty 60, 30d supply, fill #1

## 2022-03-09 MED ORDER — DAPAGLIFLOZIN PROPANEDIOL 10 MG PO TABS
10.0000 mg | ORAL_TABLET | Freq: Every day | ORAL | 0 refills | Status: DC
  Filled 2022-03-09: qty 30, 30d supply, fill #0

## 2022-03-09 MED ORDER — GABAPENTIN 300 MG PO CAPS
600.0000 mg | ORAL_CAPSULE | Freq: Three times a day (TID) | ORAL | 2 refills | Status: DC
  Filled 2022-03-09: qty 180, 30d supply, fill #0
  Filled 2022-04-11 – 2022-05-23 (×4): qty 180, 30d supply, fill #1

## 2022-03-09 NOTE — Telephone Encounter (Signed)
Requested Prescriptions  Pending Prescriptions Disp Refills   dapagliflozin propanediol (FARXIGA) 10 MG TABS tablet 30 tablet 0    Sig: Take 1 tablet (10 mg total) by mouth daily before breakfast.     Endocrinology:  Diabetes - SGLT2 Inhibitors Passed - 03/07/2022  4:40 PM      Passed - Cr in normal range and within 360 days    Creat  Date Value Ref Range Status  05/25/2016 0.81 0.50 - 1.10 mg/dL Final   Creatinine, Ser  Date Value Ref Range Status  09/20/2021 0.81 0.57 - 1.00 mg/dL Final   Creatinine, Urine  Date Value Ref Range Status  09/24/2015 86 20 - 320 mg/dL Final         Passed - HBA1C is between 0 and 7.9 and within 180 days    HbA1c, POC (controlled diabetic range)  Date Value Ref Range Status  09/20/2021 7.9 (A) 0.0 - 7.0 % Final         Passed - eGFR in normal range and within 360 days    GFR, Est African American  Date Value Ref Range Status  05/25/2016 >89 >=60 mL/min Final   GFR calc Af Amer  Date Value Ref Range Status  04/22/2020 96 >59 mL/min/1.73 Final    Comment:    **In accordance with recommendations from the NKF-ASN Task force,**   [facility] is in the process of updating its eGFR calculation to the   2021 CKD-EPI creatinine equation that estimates kidney function   without a race variable.    GFR, Est Non African American  Date Value Ref Range Status  05/25/2016 >89 >=60 mL/min Final   GFR, Estimated  Date Value Ref Range Status  11/03/2020 50 (L) >60 mL/min Final    Comment:    (NOTE) Calculated using the CKD-EPI Creatinine Equation (2021)    eGFR  Date Value Ref Range Status  09/20/2021 91 >59 mL/min/1.73 Final         Passed - Valid encounter within last 6 months    Recent Outpatient Visits           5 months ago Type 2 diabetes mellitus with diabetic polyneuropathy, with long-term current use of insulin ([place])   [place], [place], MD   5 months ago Type 2 diabetes mellitus with diabetic  polyneuropathy, with long-term current use of insulin ([place])   [place], [place], MD   10 months ago Type 2 diabetes mellitus with diabetic polyneuropathy, with long-term current use of insulin ([place])   [place], Charlane Ferretti, MD   11 months ago Acute recurrent maxillary sinusitis   [place] Elsie Stain, MD   1 year ago Viral gastroenteritis   [place], Enobong, MD       Future Appointments             In 3 weeks Charlott Rakes, MD [place]

## 2022-03-09 NOTE — Telephone Encounter (Signed)
Requested Prescriptions  Pending Prescriptions Disp Refills   metFORMIN (GLUCOPHAGE) 1000 MG tablet 180 tablet 0    Sig: Take 1 tablet by mouth 2 (two) times daily with a meal.     Endocrinology:  Diabetes - Biguanides Failed - 03/07/2022  4:40 PM      Failed - B12 Level in normal range and within 720 days    No results found for: "VITAMINB12"       Passed - Cr in normal range and within 360 days    Creat  Date Value Ref Range Status  05/25/2016 0.81 0.50 - 1.10 mg/dL Final   Creatinine, Ser  Date Value Ref Range Status  09/20/2021 0.81 0.57 - 1.00 mg/dL Final   Creatinine, Urine  Date Value Ref Range Status  09/24/2015 86 20 - 320 mg/dL Final         Passed - HBA1C is between 0 and 7.9 and within 180 days    HbA1c, POC (controlled diabetic range)  Date Value Ref Range Status  09/20/2021 7.9 (A) 0.0 - 7.0 % Final         Passed - eGFR in normal range and within 360 days    GFR, Est African American  Date Value Ref Range Status  05/25/2016 >89 >=60 mL/min Final   GFR calc Af Amer  Date Value Ref Range Status  04/22/2020 96 >59 mL/min/1.73 Final    Comment:    **In accordance with recommendations from the NKF-ASN Task force,**   [facility] is in the process of updating its eGFR calculation to the   2021 CKD-EPI creatinine equation that estimates kidney function   without a race variable.    GFR, Est Non African American  Date Value Ref Range Status  05/25/2016 >89 >=60 mL/min Final   GFR, Estimated  Date Value Ref Range Status  11/03/2020 50 (L) >60 mL/min Final    Comment:    (NOTE) Calculated using the CKD-EPI Creatinine Equation (2021)    eGFR  Date Value Ref Range Status  09/20/2021 91 >59 mL/min/1.73 Final         Passed - Valid encounter within last 6 months    Recent Outpatient Visits           5 months ago Type 2 diabetes mellitus with diabetic polyneuropathy, with long-term current use of insulin ([place])   [place], [place], MD   5 months ago Type 2 diabetes mellitus with diabetic polyneuropathy, with long-term current use of insulin ([place])   [place], [place], MD   10 months ago Type 2 diabetes mellitus with diabetic polyneuropathy, with long-term current use of insulin ([place])   [place], Charlane Ferretti, MD   11 months ago Acute recurrent maxillary sinusitis   [place] Elsie Stain, MD   1 year ago Viral gastroenteritis   [place], Enobong, MD       Future Appointments             In 3 weeks Charlott Rakes, MD [place] - CBC within normal limits and completed in the last 12 months    WBC  Date Value Ref Range Status  09/20/2021 CANCELED x10E3/uL     Comment:    [facility] was unable to collect sufficient specimen to  perform the following test(s), and is providing the patient with re-collection instructions.  Result canceled by the ancillary.   11/03/2020 8.0 4.0 - 10.5 K/uL Final   RBC  Date Value Ref Range Status  09/20/2021 CANCELED      Comment:    Test not performed  Result canceled by the ancillary.   11/03/2020 4.16 3.87 - 5.11 MIL/uL Final   Hemoglobin  Date Value Ref Range Status  09/20/2021 CANCELED      Comment:    Test not performed  Result canceled by the ancillary.    Hematocrit  Date Value Ref Range Status  09/20/2021 CANCELED      Comment:    Test not performed  Result canceled by the ancillary.    MCHC  Date Value Ref Range Status  11/03/2020 32.5 30.0 - 36.0 g/dL Final   [facility]  Date Value Ref Range Status  11/03/2020 29.6 26.0 - 34.0 pg Final   MCV  Date Value Ref Range Status  11/03/2020 90.9 80.0 - 100.0 fL Final   No results found for: "PLTCOUNTKUC", "LABPLAT", "POCPLA" RDW  Date Value Ref Range Status  11/03/2020 13.3 11.5 - 15.5  % Final          gabapentin (NEURONTIN) 300 MG capsule 180 capsule 2    Sig: Take 2 capsules (600 mg total) by mouth 3 (three) times daily.     Neurology: Anticonvulsants - gabapentin Passed - 03/07/2022  4:40 PM      Passed - Cr in normal range and within 360 days    Creat  Date Value Ref Range Status  05/25/2016 0.81 0.50 - 1.10 mg/dL Final   Creatinine, Ser  Date Value Ref Range Status  09/20/2021 0.81 0.57 - 1.00 mg/dL Final   Creatinine, Urine  Date Value Ref Range Status  09/24/2015 86 20 - 320 mg/dL Final         Passed - Completed PHQ-2 or PHQ-9 in the last 360 days      Passed - Valid encounter within last 12 months    Recent Outpatient Visits           5 months ago Type 2 diabetes mellitus with diabetic polyneuropathy, with long-term current use of insulin ([place])   [place], [place], MD   5 months ago Type 2 diabetes mellitus with diabetic polyneuropathy, with long-term current use of insulin ([place])   [place], [place], MD   10 months ago Type 2 diabetes mellitus with diabetic polyneuropathy, with long-term current use of insulin ([place])   [place], Charlane Ferretti, MD   11 months ago Acute recurrent maxillary sinusitis   [place] Elsie Stain, MD   1 year ago Viral gastroenteritis   [place], Enobong, MD       Future Appointments             In 3 weeks Charlott Rakes, MD [place]

## 2022-03-10 ENCOUNTER — Other Ambulatory Visit: Payer: Self-pay

## 2022-03-22 ENCOUNTER — Other Ambulatory Visit: Payer: Self-pay

## 2022-03-22 ENCOUNTER — Telehealth: Payer: Managed Care, Other (non HMO) | Admitting: Physician Assistant

## 2022-03-22 ENCOUNTER — Encounter: Payer: Self-pay | Admitting: Physician Assistant

## 2022-03-22 DIAGNOSIS — J208 Acute bronchitis due to other specified organisms: Secondary | ICD-10-CM | POA: Diagnosis not present

## 2022-03-22 MED ORDER — BENZONATATE 100 MG PO CAPS
100.0000 mg | ORAL_CAPSULE | Freq: Three times a day (TID) | ORAL | 0 refills | Status: DC | PRN
  Filled 2022-03-22: qty 30, 10d supply, fill #0

## 2022-03-22 MED ORDER — ALBUTEROL SULFATE HFA 108 (90 BASE) MCG/ACT IN AERS
2.0000 | INHALATION_SPRAY | Freq: Four times a day (QID) | RESPIRATORY_TRACT | 0 refills | Status: DC | PRN
  Filled 2022-03-22: qty 6.7, 25d supply, fill #0
  Filled 2022-04-11: qty 6.7, 25d supply, fill #1

## 2022-03-22 NOTE — Patient Instructions (Signed)
Mariah Miller, thank you for joining Leeanne Rio, PA-C for today's virtual visit.  While this provider is not your primary care provider (PCP), if your PCP is located in our provider database this encounter information will be shared with them immediately following your visit.   [facility] account gives you access to today's visit and all your visits, tests, and labs performed at [facility] " click here if you don't have a [facility] account or go to [email or web address]  Consent: (Patient) Mariah Miller provided verbal consent for this virtual visit at the beginning of the encounter.  Current Medications:  Current Outpatient Medications:    aspirin EC 81 MG tablet, Take 1 tablet (81 mg total) by mouth daily., Disp: 30 tablet, Rfl: 5   Blood Glucose Monitoring Suppl (TRUE METRIX METER) w/Device KIT, USE AS DIRECTED (Patient taking differently: 1 each by Other route as directed.), Disp: 1 kit, Rfl: 0   Continuous Blood Gluc Receiver (DEXCOM G6 RECEIVER) DEVI, Use to check blood sugar three times daily. . (Patient not taking: Reported on 12/16/2021), Disp: 1 each, Rfl: 0   Continuous Blood Gluc Sensor (DEXCOM G6 SENSOR) MISC, Use to check blood sugar three times daily. Change sensors once every 10 days. Dx code E11.42 (Patient not taking: Reported on 12/16/2021), Disp: 3 each, Rfl: 2   Continuous Blood Gluc Transmit (DEXCOM G6 TRANSMITTER) MISC, Use to check blood sugar three times daily. Change transmitter once every 90 days. (Patient not taking: Reported on 12/16/2021), Disp: 1 each, Rfl: 1   dapagliflozin propanediol (FARXIGA) 10 MG TABS tablet, Take 1 tablet (10 mg total) by mouth daily before breakfast., Disp: 30 tablet, Rfl: 0   doxycycline (VIBRAMYCIN) 100 MG capsule, Take 1 capsule (100 mg total) by mouth 2 (two) times daily. (Patient not taking: Reported on 12/16/2021), Disp: 20 capsule, Rfl: 0   Dulaglutide (TRULICITY) 3 YQ/6.5HQ  SOPN, Inject 3 mg once a week as directed., Disp: 6 mL, Rfl: 1   DULoxetine (CYMBALTA) 60 MG capsule, Take 1 capsule (60 mg total) by mouth daily., Disp: 30 capsule, Rfl: 3   ergocalciferol (DRISDOL) 1.25 MG (50000 UT) capsule, Take 1 capsule (50,000 Units total) by mouth once a week., Disp: 12 capsule, Rfl: 1   gabapentin (NEURONTIN) 300 MG capsule, Take 2 capsules (600 mg total) by mouth 3 (three) times daily., Disp: 180 capsule, Rfl: 2   glucose blood (TRUE METRIX BLOOD GLUCOSE TEST) test strip, Use as instructed (Patient taking differently: 1 each by Other route as directed.), Disp: 100 each, Rfl: 12   ibuprofen (ADVIL) 600 MG tablet, Take 1 tablet (600 mg total) by mouth every 8 (eight) hours as needed. (Patient not taking: Reported on 12/16/2021), Disp: 30 tablet, Rfl: 0   insulin glargine-yfgn (SEMGLEE, YFGN,) 100 UNIT/ML Pen, Inject twice daily 36 units in the morning and 30 units in the evening, Disp: 30 mL, Rfl: 3   insulin lispro (HUMALOG) 100 UNIT/ML injection, Inject 0.01 mLs (1 Units total) into the skin as needed for high blood sugar. Sliding scale needed., Disp: 10 mL, Rfl: 3   Insulin Pen Needle (UNIFINE PENTIPS) 32G X 4 MM MISC, Use as directed with insulin and victoza, Disp: 100 each, Rfl: 0   linaclotide (LINZESS) 72 MCG capsule, Take 1 capsule (72 mcg total) by mouth daily before breakfast., Disp: 30 capsule, Rfl: 3   lisinopril-hydrochlorothiazide (ZESTORETIC) 20-12.5 MG tablet, TAKE 2 TABLETS BY MOUTH ONCE DAILY., Disp: 180 tablet, Rfl: 1  metFORMIN (GLUCOPHAGE) 1000 MG tablet, Take 1 tablet by mouth 2 (two) times daily with a meal., Disp: 180 tablet, Rfl: 0   metoprolol succinate (TOPROL-XL) 100 MG 24 hr tablet, Take 1 tablet (100 mg total) by mouth daily. APPOINTMENT NEEDED FOR FUTURE REFILLS, Disp: 90 tablet, Rfl: 0   rosuvastatin (CRESTOR) 20 MG tablet, Take 1 tablet (20 mg total) by mouth daily., Disp: 90 tablet, Rfl: 1   TRUEplus Lancets 28G MISC, USE AS INSTRUCTED  (Patient taking differently: 1 each by Other route as directed.), Disp: 100 each, Rfl: 5   Medications ordered in this encounter:  No orders of the defined types were placed in this encounter.    *If you need refills on other medications prior to your next appointment, please contact your pharmacy*  Follow-Up: Call back or seek an in-person evaluation if the symptoms worsen or if the condition fails to improve as anticipated.  [place] [phone]  Other Instructions Please keep well-hydrated and get plenty of rest.  Start OTC plain Mucinex. Can use this along with your Dayquil. Take the Tessalon as directed. The Albuterol can be used as directed, when needed, to relax the airways.  If not continuing to improve/resolve, please let us know ASAP. Take care!   If you have been instructed to have an in-person evaluation today at a local Urgent Care facility, please use the link below. It will take you to a list of all of our available [facility], including address, phone number and hours of operation. Please do not delay care.  [facility]  If you or a family member do not have a primary care provider, use the link below to schedule a visit and establish care. When you choose a [facility] primary care physician or advanced practice provider, you gain a long-term partner in health. Find a Primary Care Provider  Learn more about [facility]'s in-office and virtual care options: [facility]

## 2022-03-22 NOTE — Progress Notes (Signed)
Virtual Visit Consent   Mariah Miller, you are scheduled for a virtual visit with a [facility] provider today. Just as with appointments in the office, your consent must be obtained to participate. Your consent will be active for this visit and any virtual visit you may have with one of our providers in the next 365 days. If you have a MyChart account, a copy of this consent can be sent to you electronically.  As this is a virtual visit, video technology does not allow for your provider to perform a traditional examination. This may limit your provider's ability to fully assess your condition. If your provider identifies any concerns that need to be evaluated in person or the need to arrange testing (such as labs, EKG, etc.), we will make arrangements to do so. Although advances in technology are sophisticated, we cannot ensure that it will always work on either your end or our end. If the connection with a video visit is poor, the visit may have to be switched to a telephone visit. With either a video or telephone visit, we are not always able to ensure that we have a secure connection.  By engaging in this virtual visit, you consent to the provision of healthcare and authorize for your insurance to be billed (if applicable) for the services provided during this visit. Depending on your insurance coverage, you may receive a charge related to this service.  I need to obtain your verbal consent now. Are you willing to proceed with your visit today? Mariah Miller has provided verbal consent on 03/22/2022 for a virtual visit (video or telephone). Leeanne Rio, [place]  Date: 03/22/2022 8:03 AM  Virtual Visit via Video Note   I, Leeanne Rio, connected with  Mariah Miller  ([id number], June 06, 1975) on 03/22/22 at  7:45 AM EST by a video-enabled telemedicine application and verified that I am speaking with the correct person using two identifiers.  Location: Patient: Virtual Visit  Location Patient: Home Provider: Virtual Visit Location Provider: Home Office   I discussed the limitations of evaluation and management by telemedicine and the availability of in person appointments. The patient expressed understanding and agreed to proceed.    History of Present Illness: Mariah Miller is a 47 y.o. who identifies as a female who was assigned female at birth, and is being seen today for residual URI symptoms after recent COVID-19 a couple of weeks ago. Notes much better than she was originally with her COVID symptoms. Still having some cough and spasm with some chest tightness. Notes mucous production but thin and clear. Cough is in spells and worse at night. Some occasional chest tightness. Denies fever, chills, aches. Is taking OTC Dayquil/Nyquil.  HPI: HPI  Problems:  Patient Active Problem List   Diagnosis Date Noted   Acute recurrent maxillary sinusitis 03/17/2021   Pain in joint of left knee 12/14/2020   Migraine 10/01/2020   Eczema 05/26/2017   Primary insomnia 09/28/2016   Anxiety and depression 05/26/2016   OSA (obstructive sleep apnea) 04/30/2015   Uncontrolled type 2 diabetes mellitus with hyperglycemia, with long-term current use of insulin ([place]) 04/28/2015   ASCUS with positive high risk HPV cervical 06/06/2014   Dyslipidemia 03/17/2014   Essential hypertension 03/17/2014   Diabetic neuropathy, painful ([place]) 03/21/2013   A C DEGENERATION-CHRONIC 10/27/2008   IMPINGEMENT SYNDROME 05/01/2008    Allergies:  Allergies  Allergen Reactions   Tomato Itching and Rash   Medications:  Current Outpatient Medications:  albuterol (VENTOLIN HFA) 108 (90 Base) MCG/ACT inhaler, Inhale 2 puffs into the lungs every 6 (six) hours as needed for wheezing or shortness of breath., Disp: 8 g, Rfl: 0   benzonatate (TESSALON) 100 MG capsule, Take 1 capsule (100 mg total) by mouth 3 (three) times daily as needed for cough., Disp: 30 capsule, Rfl: 0   aspirin EC 81 MG  tablet, Take 1 tablet (81 mg total) by mouth daily., Disp: 30 tablet, Rfl: 5   dapagliflozin propanediol (FARXIGA) 10 MG TABS tablet, Take 1 tablet (10 mg total) by mouth daily before breakfast., Disp: 30 tablet, Rfl: 0   Dulaglutide (TRULICITY) 3 DD/2.2GU SOPN, Inject 3 mg once a week as directed., Disp: 6 mL, Rfl: 1   DULoxetine (CYMBALTA) 60 MG capsule, Take 1 capsule (60 mg total) by mouth daily., Disp: 30 capsule, Rfl: 3   ergocalciferol (DRISDOL) 1.25 MG (50000 UT) capsule, Take 1 capsule (50,000 Units total) by mouth once a week., Disp: 12 capsule, Rfl: 1   gabapentin (NEURONTIN) 300 MG capsule, Take 2 capsules (600 mg total) by mouth 3 (three) times daily., Disp: 180 capsule, Rfl: 2   glucose blood (TRUE METRIX BLOOD GLUCOSE TEST) test strip, Use as instructed (Patient taking differently: 1 each by Other route as directed.), Disp: 100 each, Rfl: 12   insulin glargine-yfgn (SEMGLEE, YFGN,) 100 UNIT/ML Pen, Inject twice daily 36 units in the morning and 30 units in the evening, Disp: 30 mL, Rfl: 3   insulin lispro (HUMALOG) 100 UNIT/ML injection, Inject 0.01 mLs (1 Units total) into the skin as needed for high blood sugar. Sliding scale needed., Disp: 10 mL, Rfl: 3   Insulin Pen Needle (UNIFINE PENTIPS) 32G X 4 MM MISC, Use as directed with insulin and victoza, Disp: 100 each, Rfl: 0   linaclotide (LINZESS) 72 MCG capsule, Take 1 capsule (72 mcg total) by mouth daily before breakfast., Disp: 30 capsule, Rfl: 3   lisinopril-hydrochlorothiazide (ZESTORETIC) 20-12.5 MG tablet, TAKE 2 TABLETS BY MOUTH ONCE DAILY., Disp: 180 tablet, Rfl: 1   metFORMIN (GLUCOPHAGE) 1000 MG tablet, Take 1 tablet by mouth 2 (two) times daily with a meal., Disp: 180 tablet, Rfl: 0   metoprolol succinate (TOPROL-XL) 100 MG 24 hr tablet, Take 1 tablet (100 mg total) by mouth daily. APPOINTMENT NEEDED FOR FUTURE REFILLS, Disp: 90 tablet, Rfl: 0   rosuvastatin (CRESTOR) 20 MG tablet, Take 1 tablet (20 mg total) by mouth  daily., Disp: 90 tablet, Rfl: 1   TRUEplus Lancets 28G MISC, USE AS INSTRUCTED (Patient taking differently: 1 each by Other route as directed.), Disp: 100 each, Rfl: 5  Observations/Objective: Patient is well-developed, well-nourished in no acute distress.  Resting comfortably at home.  Head is normocephalic, atraumatic.  No labored breathing. Speech is clear and coherent with logical content.  Patient is alert and oriented at baseline.   Assessment and Plan: 1. Viral bronchitis - albuterol (VENTOLIN HFA) 108 (90 Base) MCG/ACT inhaler; Inhale 2 puffs into the lungs every 6 (six) hours as needed for wheezing or shortness of breath.  Dispense: 8 g; Refill: 0 - benzonatate (TESSALON) 100 MG capsule; Take 1 capsule (100 mg total) by mouth 3 (three) times daily as needed for cough.  Dispense: 30 capsule; Refill: 0  Mostly resolved. Residual airway inflammation. Do not feel any indication for antibiotics. Feel this is just viral bronchitis and some post viral cough syndrome. Supportive measures and OTC medications reviewed. Start Tessalon per orders. Will add on albuterol for bronchospasm. Discussed  if not continuing to improve/resolve, can consider addition of steroid but want to avoid if possible due to her status as a diabetic.   Follow Up Instructions: I discussed the assessment and treatment plan with the patient. The patient was provided an opportunity to ask questions and all were answered. The patient agreed with the plan and demonstrated an understanding of the instructions.  A copy of instructions were sent to the patient via MyChart unless otherwise noted below.   The patient was advised to call back or seek an in-person evaluation if the symptoms worsen or if the condition fails to improve as anticipated.  Time:  I spent 10 minutes with the patient via telehealth technology discussing the above problems/concerns.    Piedad Climes, PA-C

## 2022-03-30 ENCOUNTER — Ambulatory Visit: Payer: Managed Care, Other (non HMO) | Attending: Family Medicine | Admitting: Family Medicine

## 2022-03-30 ENCOUNTER — Encounter: Payer: Self-pay | Admitting: Family Medicine

## 2022-03-30 VITALS — BP 130/76 | HR 97 | Temp 98.2°F | Ht 65.0 in | Wt 208.4 lb

## 2022-03-30 DIAGNOSIS — I152 Hypertension secondary to endocrine disorders: Secondary | ICD-10-CM

## 2022-03-30 DIAGNOSIS — Z23 Encounter for immunization: Secondary | ICD-10-CM

## 2022-03-30 DIAGNOSIS — E785 Hyperlipidemia, unspecified: Secondary | ICD-10-CM

## 2022-03-30 DIAGNOSIS — E1142 Type 2 diabetes mellitus with diabetic polyneuropathy: Secondary | ICD-10-CM

## 2022-03-30 DIAGNOSIS — E1159 Type 2 diabetes mellitus with other circulatory complications: Secondary | ICD-10-CM

## 2022-03-30 DIAGNOSIS — Z794 Long term (current) use of insulin: Secondary | ICD-10-CM

## 2022-03-30 DIAGNOSIS — E1169 Type 2 diabetes mellitus with other specified complication: Secondary | ICD-10-CM

## 2022-03-30 LAB — POCT GLYCOSYLATED HEMOGLOBIN (HGB A1C): HbA1c, POC (controlled diabetic range): 8.9 % — AB (ref 0.0–7.0)

## 2022-03-30 LAB — GLUCOSE, POCT (MANUAL RESULT ENTRY): POC Glucose: 149 mg/dl — AB (ref 70–99)

## 2022-03-30 MED ORDER — INSULIN LISPRO 100 UNIT/ML IJ SOLN: 0.0000 [IU] | Freq: Three times a day (TID) | INTRAMUSCULAR | 3 refills | Status: DC

## 2022-03-30 NOTE — Progress Notes (Signed)
Pain in left shoulder BP medication makes her vision blurry after taking.

## 2022-03-30 NOTE — Progress Notes (Signed)
Subjective:  Patient ID: Mariah Miller, female    DOB: 07-18-1975  Age: 47 y.o. MRN: [id number]  CC: Diabetes   HPI Mariah Miller is a 47 y.o. year old female with a history of ype 2 diabetes mellitus (A1c 8.9), hypertension, depression.   Interval History:  She had COVID early this month and is feeling better now.  A1c is 8.9 up from 7.9 and she has been adjusting the dose of her insulin due to the fact that she has had hypoglycemia in the past.  This morning she administered 28 units rather than 36 because her fasting sugar was in the 90s.  She finds that when she is on night shift she snacks a lot to stay awake but when she is on day shift she is able to adhere to a diabetic diet.  She is not open to CGM due to the high co-pay associated.  She has not had any repeat hypoglycemic episodes.  Her neuropathy is controlled on gabapentin and duloxetine. She endorses adherence with her statin and her antihypertensive. She has been exercising consistently. Denies pensive additional concerns today. Past Medical History:  Diagnosis Date   Depression    Diabetes mellitus without complication ([place])    Hyperlipidemia    Hypertension    Migraine    Seizures ([place])    self reported - last seizure was Oct 2019 "absent seizure"    Past Surgical History:  Procedure Laterality Date   CHOLECYSTECTOMY     CYST EXCISION     SHOULDER OPEN ROTATOR CUFF REPAIR Left    TONSILLECTOMY     TUBAL LIGATION      Family History  Problem Relation Age of Onset   Hypertension Mother    Thyroid disease Mother    Irregular heart beat Mother    Bipolar disorder Sister    Colon cancer Other        great uncle   Esophageal cancer Neg Hx    Rectal cancer Neg Hx    Stomach cancer Neg Hx     Social History   Socioeconomic History   Marital status: Divorced    Spouse name: Not on file   Number of children: Not on file   Years of education: Not on file   Highest education level: Not on file   Occupational History    Comment: Assembler   Tobacco Use   Smoking status: Former    Types: Cigarettes   Smokeless tobacco: Never   Tobacco comments:    Smoked for 4 years, quit age 68  Vaping Use   Vaping Use: Never used  Substance and Sexual Activity   Alcohol use: Not Currently   Drug use: No   Sexual activity: Yes    Birth control/protection: None    Comment: Starteds menstral cycle today 12-12-2018 pt reported  Other Topics Concern   Not on file  Social History Narrative   Not on file   Social Determinants of Health   Financial Resource Strain: Not on file  Food Insecurity: Not on file  Transportation Needs: Not on file  Physical Activity: Not on file  Stress: Not on file  Social Connections: Not on file    Allergies  Allergen Reactions   Tomato Itching and Rash    Outpatient Medications Prior to Visit  Medication Sig Dispense Refill   albuterol (VENTOLIN HFA) 108 (90 Base) MCG/ACT inhaler Inhale 2 puffs into the lungs every 6 (six) hours as needed for wheezing or shortness  of breath. 8 g 0   benzonatate (TESSALON) 100 MG capsule Take 1 capsule (100 mg total) by mouth 3 (three) times daily as needed for cough. 30 capsule 0   dapagliflozin propanediol (FARXIGA) 10 MG TABS tablet Take 1 tablet (10 mg total) by mouth daily before breakfast. 30 tablet 0   Dulaglutide (TRULICITY) 3 FU/9.3AT SOPN Inject 3 mg once a week as directed. 6 mL 1   DULoxetine (CYMBALTA) 60 MG capsule Take 1 capsule (60 mg total) by mouth daily. 30 capsule 3   ergocalciferol (DRISDOL) 1.25 MG (50000 UT) capsule Take 1 capsule (50,000 Units total) by mouth once a week. 12 capsule 1   gabapentin (NEURONTIN) 300 MG capsule Take 2 capsules (600 mg total) by mouth 3 (three) times daily. 180 capsule 2   glucose blood (TRUE METRIX BLOOD GLUCOSE TEST) test strip Use as instructed (Patient taking differently: 1 each by Other route as directed.) 100 each 12   insulin glargine-yfgn (SEMGLEE, YFGN,) 100  UNIT/ML Pen Inject twice daily 36 units in the morning and 30 units in the evening 30 mL 3   linaclotide (LINZESS) 72 MCG capsule Take 1 capsule (72 mcg total) by mouth daily before breakfast. 30 capsule 3   lisinopril-hydrochlorothiazide (ZESTORETIC) 20-12.5 MG tablet TAKE 2 TABLETS BY MOUTH ONCE DAILY. 180 tablet 1   metFORMIN (GLUCOPHAGE) 1000 MG tablet Take 1 tablet by mouth 2 (two) times daily with a meal. 180 tablet 0   metoprolol succinate (TOPROL-XL) 100 MG 24 hr tablet Take 1 tablet (100 mg total) by mouth daily. APPOINTMENT NEEDED FOR FUTURE REFILLS 90 tablet 0   rosuvastatin (CRESTOR) 20 MG tablet Take 1 tablet (20 mg total) by mouth daily. 90 tablet 1   TRUEplus Lancets 28G MISC USE AS INSTRUCTED (Patient taking differently: 1 each by Other route as directed.) 100 each 5   insulin lispro (HUMALOG) 100 UNIT/ML injection Inject 0.01 mLs (1 Units total) into the skin as needed for high blood sugar. Sliding scale needed. 10 mL 3   aspirin EC 81 MG tablet Take 1 tablet (81 mg total) by mouth daily. 30 tablet 5   No facility-administered medications prior to visit.     ROS Review of Systems  Constitutional:  Negative for activity change and appetite change.  HENT:  Negative for sinus pressure and sore throat.   Respiratory:  Negative for chest tightness, shortness of breath and wheezing.   Cardiovascular:  Negative for chest pain and palpitations.  Gastrointestinal:  Negative for abdominal distention, abdominal pain and constipation.  Genitourinary: Negative.   Musculoskeletal: Negative.   Psychiatric/Behavioral:  Negative for behavioral problems and dysphoric mood.     Objective:  BP 130/76   Pulse 97   Temp 98.2 F (36.8 C) (Oral)   Ht 5\' 5"  (1.651 m)   Wt 208 lb 6.4 oz (94.5 kg)   SpO2 99%   BMI 34.68 kg/m      03/30/2022    1:49 PM 12/16/2021    9:50 AM 12/16/2021    9:40 AM  BP/Weight  Systolic BP 557 322 025  Diastolic BP 76 74 77  Wt. (Lbs) 208.4    BMI 34.68  kg/m2        Physical Exam Constitutional:      Appearance: She is well-developed.  Cardiovascular:     Rate and Rhythm: Normal rate.     Heart sounds: Normal heart sounds. No murmur heard. Pulmonary:     Effort: Pulmonary effort is normal.  Breath sounds: Normal breath sounds. No wheezing or rales.  Chest:     Chest wall: No tenderness.  Abdominal:     General: Bowel sounds are normal. There is no distension.     Palpations: Abdomen is soft. There is no mass.     Tenderness: There is no abdominal tenderness.  Musculoskeletal:        General: Normal range of motion.     Right lower leg: No edema.     Left lower leg: No edema.  Neurological:     Mental Status: She is alert and oriented to person, place, and time.  Psychiatric:        Mood and Affect: Mood normal.        Latest Ref Rng & Units 09/20/2021   11:47 AM 11/03/2020    6:48 AM 10/28/2020    9:56 AM  CMP  Glucose 70 - 99 mg/dL 95  716  967   BUN 6 - 24 mg/dL 8  23  13    Creatinine 0.57 - 1.00 mg/dL  8.93  8.10   Sodium 134 - 144 mmol/L 139  133  138   Potassium 3.5 - 5.2 mmol/L 4.5  3.7  4.8   Chloride 96 - 106 mmol/L 106  98  102   CO2 20 - 29 mmol/L 21  25  22    Calcium 8.7 - 10.2 mg/dL 9.1  8.8  9.2   Total Protein 6.0 - 8.5 g/dL 6.6  7.1    Total Bilirubin 0.0 - 1.2 mg/dL 0.2  0.7    Alkaline Phos 44 - 121 IU/L 85  73    AST 0 - 40 IU/L 19  19    ALT 0 - 32 IU/L 16  14      Lipid Panel     Component Value Date/Time   CHOL 177 09/20/2021 1147   TRIG 145 09/20/2021 1147   HDL 39 (L) 09/20/2021 1147   CHOLHDL 4.3 10/28/2020 0956   CHOLHDL 4.7 05/25/2016 1543   VLDL 37 (H) 05/25/2016 1543   LDLCALC 112 (H) 09/20/2021 1147    CBC    Component Value Date/Time   WBC CANCELED 09/20/2021 1147   WBC 8.0 11/03/2020 0648   RBC CANCELED 09/20/2021 1147   RBC 4.16 11/03/2020 0648   HGB CANCELED 09/20/2021 1147   HCT CANCELED 09/20/2021 1147   PLT CANCELED 09/20/2021 1147   MCV 90.9  11/03/2020 0648   MCH 29.6 11/03/2020 0648   MCHC 32.5 11/03/2020 0648   RDW 13.3 11/03/2020 0648   LYMPHSABS CANCELED 09/20/2021 1147   MONOABS 0.5 10/01/2020 0835   EOSABS CANCELED 09/20/2021 1147   BASOSABS CANCELED 09/20/2021 1147    Lab Results  Component Value Date   HGBA1C 8.9 (A) 03/30/2022    Assessment & Plan:  1. Type 2 diabetes mellitus with diabetic polyneuropathy, with long-term current use of insulin (HCC) Uncontrolled with A1c of 8.9; goal is less than 7.0 Her hypoglycemia precludes strict adjustment of her insulin She will benefit from CGM but is not open to this due to the high co-pay Encouraged to increase her insulin by 2 units twice daily if sugars remain above goal Counseled on Diabetic diet, my plate method, 09/22/2021 minutes of moderate intensity exercise/week Blood sugar logs with fasting goals of 80-120 mg/dl, random of less than 04/01/2022 and in the event of sugars less than 60 mg/dl or greater than 102 mg/dl encouraged to notify the clinic. Advised on the need  for annual eye exams, annual foot exams, Pneumonia vaccine. - POCT glucose (manual entry) - POCT glycosylated hemoglobin (Hb A1C) - Microalbumin / creatinine urine ratio; Future - LP+Non-HDL Cholesterol; Future - CMP14+EGFR; Future  2. Hypertension associated with diabetes (HCC) Controlled Continue antihypertensives Counseled on blood pressure goal of less than 130/80, low-sodium, DASH diet, medication compliance, 150 minutes of moderate intensity exercise per week. Discussed medication compliance, adverse effects.   3. Hyperlipidemia associated with type 2 diabetes mellitus (HCC) LDL is above goal Will check lipid panel and adjust regimen accordingly Continue statin Low-cholesterol diet  4. Need for immunization against influenza - Flu Vaccine QUAD 4mo+IM (Fluarix, Fluzone & Alfiuria Quad PF)    Meds ordered this encounter  Medications   insulin lispro (HUMALOG) 100 UNIT/ML injection    Sig:  Inject 0-0.12 mLs (0-12 Units total) into the skin 3 (three) times daily with meals. As per sliding scale    Dispense:  10 mL    Refill:  3    For blood sugars 0-150 give 0 units of insulin, 151-200 give 2 units of insulin, 201-250 give 4 units, 251-300 give 6 units, 301-350 give 8 units, 351-400 give 10 units,> 400 give 12 units    Follow-up: Return in about 3 months (around 06/29/2022) for Chronic medical conditions.       Hoy Register, MD, FAAFP. [facility] [place], [place] [phone]   03/30/2022, 6:11 PM

## 2022-04-06 ENCOUNTER — Other Ambulatory Visit: Payer: Self-pay

## 2022-04-06 ENCOUNTER — Ambulatory Visit: Payer: Managed Care, Other (non HMO) | Attending: Family Medicine

## 2022-04-06 DIAGNOSIS — Z794 Long term (current) use of insulin: Secondary | ICD-10-CM

## 2022-04-08 LAB — CMP14+EGFR
ALT: 8 IU/L (ref 0–32)
AST: 12 IU/L (ref 0–40)
Albumin/Globulin Ratio: 1.4 (ref 1.2–2.2)
Albumin: 4 g/dL (ref 3.9–4.9)
Alkaline Phosphatase: 81 IU/L (ref 44–121)
BUN/Creatinine Ratio: 8 — ABNORMAL LOW (ref 9–23)
BUN: 6 mg/dL (ref 6–24)
Bilirubin Total: 0.2 mg/dL (ref 0.0–1.2)
CO2: 21 mmol/L (ref 20–29)
Calcium: 9.4 mg/dL (ref 8.7–10.2)
Chloride: 105 mmol/L (ref 96–106)
Creatinine, Ser: 0.77 mg/dL (ref 0.57–1.00)
Globulin, Total: 2.9 g/dL (ref 1.5–4.5)
Glucose: 113 mg/dL — ABNORMAL HIGH (ref 70–99)
Potassium: 4.8 mmol/L (ref 3.5–5.2)
Sodium: 140 mmol/L (ref 134–144)
Total Protein: 6.9 g/dL (ref 6.0–8.5)
eGFR: 97 mL/min/{1.73_m2} (ref >59)

## 2022-04-08 LAB — MICROALBUMIN / CREATININE URINE RATIO
Creatinine, Urine: 388.8 mg/dL
Microalb/Creat Ratio: 13 mg/g creat (ref 0–29)
Microalbumin, Urine: 50.8 ug/mL

## 2022-04-08 LAB — LP+NON-HDL CHOLESTEROL
Cholesterol, Total: 181 mg/dL (ref 100–199)
HDL: 45 mg/dL (ref >39)
LDL Chol Calc (NIH): 123 mg/dL — ABNORMAL HIGH (ref 0–99)
Total Non-HDL-Chol (LDL+VLDL): 136 mg/dL — ABNORMAL HIGH (ref 0–129)
Triglycerides: 70 mg/dL (ref 0–149)
VLDL Cholesterol Cal: 13 mg/dL (ref 5–40)

## 2022-04-11 ENCOUNTER — Other Ambulatory Visit: Payer: Self-pay | Admitting: Family Medicine

## 2022-04-11 ENCOUNTER — Other Ambulatory Visit: Payer: Self-pay

## 2022-04-11 ENCOUNTER — Other Ambulatory Visit (HOSPITAL_BASED_OUTPATIENT_CLINIC_OR_DEPARTMENT_OTHER): Payer: Self-pay

## 2022-04-11 DIAGNOSIS — J208 Acute bronchitis due to other specified organisms: Secondary | ICD-10-CM

## 2022-04-11 MED ORDER — ROSUVASTATIN CALCIUM 40 MG PO TABS
40.0000 mg | ORAL_TABLET | Freq: Every day | ORAL | 1 refills | Status: DC
  Filled 2022-04-11 – 2022-05-22 (×2): qty 30, 30d supply, fill #0

## 2022-04-13 ENCOUNTER — Other Ambulatory Visit: Payer: Self-pay

## 2022-04-15 ENCOUNTER — Other Ambulatory Visit: Payer: Self-pay

## 2022-04-18 ENCOUNTER — Other Ambulatory Visit: Payer: Self-pay

## 2022-04-21 ENCOUNTER — Other Ambulatory Visit: Payer: Self-pay

## 2022-04-27 ENCOUNTER — Ambulatory Visit: Payer: Managed Care, Other (non HMO) | Attending: Family Medicine | Admitting: Family Medicine

## 2022-04-27 ENCOUNTER — Encounter: Payer: Self-pay | Admitting: Family Medicine

## 2022-04-27 VITALS — BP 148/85 | HR 98 | Temp 98.0°F | Ht 65.0 in | Wt 206.0 lb

## 2022-04-27 DIAGNOSIS — Z0001 Encounter for general adult medical examination with abnormal findings: Secondary | ICD-10-CM | POA: Diagnosis not present

## 2022-04-27 DIAGNOSIS — Z1231 Encounter for screening mammogram for malignant neoplasm of breast: Secondary | ICD-10-CM

## 2022-04-27 DIAGNOSIS — R1032 Left lower quadrant pain: Secondary | ICD-10-CM | POA: Diagnosis not present

## 2022-04-27 NOTE — Patient Instructions (Signed)

## 2022-04-27 NOTE — Progress Notes (Signed)
Subjective:  Patient ID: Mariah Miller, female    DOB: 1975/10/28  Age: 47 y.o. MRN: [id number]  CC: Gynecologic Exam   HPI Mariah Miller is a 47 y.o. year old female with a history of Type 2 diabetes mellitus (A1c 8.9), hypertension, depression.    Interval History:  She presents today for an annual physical exam. She is up-to-date on Pap smear from 04/2020 which revealed NILM, HPV negative Colonoscopy from 12/2021 was negative for malignancy She is due for a mammogram.  She exercises regularly and has an adequate intake of fruits and vegetables.  She sees an ophthalmologist and dentist regularly. During her menstrual cycle she does have intermittent left lower quadrant pain described as moderate but pain is absent during other times of the month. Past Medical History:  Diagnosis Date   Depression    Diabetes mellitus without complication ([place])    Hyperlipidemia    Hypertension    Migraine    Seizures ([place])    self reported - last seizure was Oct 2019 "absent seizure"    Past Surgical History:  Procedure Laterality Date   CHOLECYSTECTOMY     CYST EXCISION     SHOULDER OPEN ROTATOR CUFF REPAIR Left    TONSILLECTOMY     TUBAL LIGATION      Family History  Problem Relation Age of Onset   Hypertension Mother    Thyroid disease Mother    Irregular heart beat Mother    Bipolar disorder Sister    Colon cancer Other        great uncle   Esophageal cancer Neg Hx    Rectal cancer Neg Hx    Stomach cancer Neg Hx     Social History   Socioeconomic History   Marital status: Divorced    Spouse name: Not on file   Number of children: Not on file   Years of education: Not on file   Highest education level: Not on file  Occupational History    Comment: Assembler   Tobacco Use   Smoking status: Former    Types: Cigarettes   Smokeless tobacco: Never   Tobacco comments:    Smoked for 4 years, quit age 47  Vaping Use   Vaping Use: Never used  Substance and  Sexual Activity   Alcohol use: Not Currently   Drug use: No   Sexual activity: Yes    Birth control/protection: None    Comment: Starteds menstral cycle today 12-12-2018 pt reported  Other Topics Concern   Not on file  Social History Narrative   Not on file   Social Determinants of Health   Financial Resource Strain: Not on file  Food Insecurity: Not on file  Transportation Needs: Not on file  Physical Activity: Not on file  Stress: Not on file  Social Connections: Not on file    Allergies  Allergen Reactions   Tomato Itching and Rash    Outpatient Medications Prior to Visit  Medication Sig Dispense Refill   albuterol (VENTOLIN HFA) 108 (90 Base) MCG/ACT inhaler Inhale 2 puffs into the lungs every 6 (six) hours as needed for wheezing or shortness of breath. 8 g 0   benzonatate (TESSALON) 100 MG capsule Take 1 capsule (100 mg total) by mouth 3 (three) times daily as needed for cough. 30 capsule 0   dapagliflozin propanediol (FARXIGA) 10 MG TABS tablet Take 1 tablet (10 mg total) by mouth daily before breakfast. 30 tablet 0   Dulaglutide (TRULICITY) 3  MG/0.5ML SOPN Inject 3 mg once a week as directed. 6 mL 1   DULoxetine (CYMBALTA) 60 MG capsule Take 1 capsule (60 mg total) by mouth daily. 30 capsule 3   ergocalciferol (DRISDOL) 1.25 MG (50000 UT) capsule Take 1 capsule (50,000 Units total) by mouth once a week. 12 capsule 1   gabapentin (NEURONTIN) 300 MG capsule Take 2 capsules (600 mg total) by mouth 3 (three) times daily. 180 capsule 2   glucose blood (TRUE METRIX BLOOD GLUCOSE TEST) test strip Use as instructed (Patient taking differently: 1 each by Other route as directed.) 100 each 12   insulin glargine-yfgn (SEMGLEE, YFGN,) 100 UNIT/ML Pen Inject twice daily 36 units in the morning and 30 units in the evening 30 mL 3   insulin lispro (HUMALOG) 100 UNIT/ML injection Inject 0-0.12 mLs (0-12 Units total) into the skin 3 (three) times daily with meals. As per sliding scale 10  mL 3   linaclotide (LINZESS) 72 MCG capsule Take 1 capsule (72 mcg total) by mouth daily before breakfast. 30 capsule 3   lisinopril-hydrochlorothiazide (ZESTORETIC) 20-12.5 MG tablet TAKE 2 TABLETS BY MOUTH ONCE DAILY. 180 tablet 1   metFORMIN (GLUCOPHAGE) 1000 MG tablet Take 1 tablet by mouth 2 (two) times daily with a meal. 180 tablet 0   metoprolol succinate (TOPROL-XL) 100 MG 24 hr tablet Take 1 tablet (100 mg total) by mouth daily. APPOINTMENT NEEDED FOR FUTURE REFILLS 90 tablet 0   rosuvastatin (CRESTOR) 40 MG tablet Take 1 tablet (40 mg total) by mouth daily. 90 tablet 1   TRUEplus Lancets 28G MISC USE AS INSTRUCTED (Patient taking differently: 1 each by Other route as directed.) 100 each 5   aspirin EC 81 MG tablet Take 1 tablet (81 mg total) by mouth daily. 30 tablet 5   No facility-administered medications prior to visit.     ROS Review of Systems  Constitutional:  Negative for activity change and appetite change.  HENT:  Negative for sinus pressure and sore throat.   Respiratory:  Negative for chest tightness, shortness of breath and wheezing.   Cardiovascular:  Negative for chest pain and palpitations.  Gastrointestinal:  Negative for abdominal distention, abdominal pain and constipation.  Genitourinary: Negative.   Musculoskeletal: Negative.   Psychiatric/Behavioral:  Negative for behavioral problems and dysphoric mood.     Objective:  BP (!) 148/85   Pulse 98   Temp 98 F (36.7 C) (Oral)   Ht 5' 5"$  (1.651 m)   Wt 206 lb (93.4 kg)   SpO2 98%   BMI 34.28 kg/m      04/27/2022    9:37 AM 03/30/2022    1:49 PM 12/16/2021    9:50 AM  BP/Weight  Systolic BP [id number]  Diastolic BP 85 76 74  Wt. (Lbs) 206 208.4   BMI 34.28 kg/m2 34.68 kg/m2       Physical Exam Constitutional:      General: She is not in acute distress.    Appearance: She is well-developed. She is not diaphoretic.  HENT:     Head: Normocephalic.     Right Ear: External ear normal.      Left Ear: External ear normal.     Nose: Nose normal.     Mouth/Throat:     Mouth: Mucous membranes are moist.  Eyes:     Extraocular Movements: Extraocular movements intact.     Conjunctiva/sclera: Conjunctivae normal.     Pupils: Pupils are equal, round, and reactive to light.  Neck:     Vascular: No JVD.  Cardiovascular:     Rate and Rhythm: Normal rate and regular rhythm.     Pulses: Normal pulses.     Heart sounds: Normal heart sounds. No murmur heard.    No gallop.  Pulmonary:     Effort: Pulmonary effort is normal. No respiratory distress.     Breath sounds: Normal breath sounds. No wheezing or rales.  Chest:     Chest wall: No tenderness.  Breasts:    Right: No mass, skin change or tenderness.     Left: No mass, skin change or tenderness.  Abdominal:     General: Bowel sounds are normal. There is no distension.     Palpations: Abdomen is soft. There is no mass.     Tenderness: There is no abdominal tenderness.  Musculoskeletal:        General: No tenderness. Normal range of motion.     Cervical back: Normal range of motion.  Skin:    General: Skin is warm and dry.  Neurological:     Mental Status: She is alert and oriented to person, place, and time.     Deep Tendon Reflexes: Reflexes are normal and symmetric.  Psychiatric:        Mood and Affect: Mood normal.        Latest Ref Rng & Units 04/06/2022    8:43 AM 09/20/2021   11:47 AM 11/03/2020    6:48 AM  CMP  Glucose 70 - 99 mg/dL 113  95  322   BUN 6 - 24 mg/dL 6  8  23   $ Creatinine 0.57 - 1.00 mg/dL 0.77  0.81  1.35   Sodium 134 - 144 mmol/L 140  139  133   Potassium 3.5 - 5.2 mmol/L 4.8  4.5  3.7   Chloride 96 - 106 mmol/L 105  106  98   CO2 20 - 29 mmol/L 21  21  25   $ Calcium 8.7 - 10.2 mg/dL 9.4  9.1  8.8   Total Protein 6.0 - 8.5 g/dL 6.9  6.6  7.1   Total Bilirubin 0.0 - 1.2 mg/dL 0.2  0.2  0.7   Alkaline Phos 44 - 121 IU/L 81  85  73   AST 0 - 40 IU/L 12  19  19   $ ALT 0 - 32 IU/L 8  16  14      $ Lipid Panel     Component Value Date/Time   CHOL 181 04/06/2022 0843   TRIG 70 04/06/2022 0843   HDL 45 04/06/2022 0843   CHOLHDL 4.3 10/28/2020 0956   CHOLHDL 4.7 05/25/2016 1543   VLDL 37 (H) 05/25/2016 1543   LDLCALC 123 (H) 04/06/2022 0843    CBC    Component Value Date/Time   WBC CANCELED 09/20/2021 1147   WBC 8.0 11/03/2020 0648   RBC CANCELED 09/20/2021 1147   RBC 4.16 11/03/2020 0648   HGB CANCELED 09/20/2021 1147   HCT CANCELED 09/20/2021 1147   PLT CANCELED 09/20/2021 1147   MCV 90.9 11/03/2020 0648   MCH 29.6 11/03/2020 0648   MCHC 32.5 11/03/2020 0648   RDW 13.3 11/03/2020 0648   LYMPHSABS CANCELED 09/20/2021 1147   MONOABS 0.5 10/01/2020 0835   EOSABS CANCELED 09/20/2021 1147   BASOSABS CANCELED 09/20/2021 1147    Lab Results  Component Value Date   HGBA1C 8.9 (A) 03/30/2022    Assessment & Plan:  1. Annual visit for general adult medical  examination with abnormal findings Counseled on 150 minutes of exercise per week, healthy eating (including decreased daily intake of saturated fats, cholesterol, added sugars, sodium), routine healthcare maintenance.   2. Encounter for screening mammogram for malignant neoplasm of breast - MM 3D SCREEN BREAST BILATERAL; Future  3. Left lower quadrant pain Has intermittently with her monthly cycles Advised to use naproxen when this occurs   Health Care Maintenance: Up-to-date on colonoscopy No orders of the defined types were placed in this encounter.   Follow-up: Return for previously scheduled appointment.       Charlott Rakes, MD, FAAFP. [facility] and [place], [place]   04/27/2022, 12:48 PM

## 2022-04-27 NOTE — Progress Notes (Signed)
Lower left side pelvic pain during cycle.

## 2022-05-03 ENCOUNTER — Other Ambulatory Visit: Payer: Self-pay

## 2022-05-08 ENCOUNTER — Telehealth: Payer: Managed Care, Other (non HMO) | Admitting: Nurse Practitioner

## 2022-05-08 DIAGNOSIS — M25562 Pain in left knee: Secondary | ICD-10-CM

## 2022-05-08 DIAGNOSIS — G8929 Other chronic pain: Secondary | ICD-10-CM

## 2022-05-08 MED ORDER — MELOXICAM 15 MG PO TABS: 15.0000 mg | ORAL_TABLET | Freq: Every day | ORAL | 0 refills | Status: DC

## 2022-05-08 NOTE — Progress Notes (Signed)
Virtual Visit Consent   Mariah Miller, you are scheduled for a virtual visit with a [facility] provider today. Just as with appointments in the office, your consent must be obtained to participate. Your consent will be active for this visit and any virtual visit you may have with one of our providers in the next 365 days. If you have a MyChart account, a copy of this consent can be sent to you electronically.  As this is a virtual visit, video technology does not allow for your provider to perform a traditional examination. This may limit your provider's ability to fully assess your condition. If your provider identifies any concerns that need to be evaluated in person or the need to arrange testing (such as labs, EKG, etc.), we will make arrangements to do so. Although advances in technology are sophisticated, we cannot ensure that it will always work on either your end or our end. If the connection with a video visit is poor, the visit may have to be switched to a telephone visit. With either a video or telephone visit, we are not always able to ensure that we have a secure connection.  By engaging in this virtual visit, you consent to the provision of healthcare and authorize for your insurance to be billed (if applicable) for the services provided during this visit. Depending on your insurance coverage, you may receive a charge related to this service.  I need to obtain your verbal consent now. Are you willing to proceed with your visit today? Mariah Miller has provided verbal consent on 05/08/2022 for a virtual visit (video or telephone). Gildardo Pounds, NP  Date: 05/08/2022 8:35 AM  Virtual Visit via Video Note   I, Gildardo Pounds, connected with  Mariah Miller  ([id number], 46/21/1977) on 05/08/22 at  8:30 AM EST by a video-enabled telemedicine application and verified that I am speaking with the correct person using two identifiers.  Location: Patient: Virtual Visit Location  Patient: Home Provider: Virtual Visit Location Provider: Home Office   I discussed the limitations of evaluation and management by telemedicine and the availability of in person appointments. The patient expressed understanding and agreed to proceed.    History of Present Illness: Mariah Miller is a 47 y.o. who identifies as a female who was assigned female at birth, and is being seen today for lett knee pain.  Onset of left knee swelling over the past 2 days. She does report a history of left knee pain and has seen an orthopedist for this in the past (12-2020).  Knee pain is unrelated to any injury or trauma.  Her job does require her to stand on concrete floors all night without any padding.  Problems:  Patient Active Problem List   Diagnosis Date Noted   Acute recurrent maxillary sinusitis 03/17/2021   Pain in joint of left knee 12/14/2020   Migraine 10/01/2020   Eczema 05/26/2017   Primary insomnia 09/28/2016   Anxiety and depression 05/26/2016   OSA (obstructive sleep apnea) 04/30/2015   Uncontrolled type 2 diabetes mellitus with hyperglycemia, with long-term current use of insulin ([place]) 04/28/2015   ASCUS with positive high risk HPV cervical 06/06/2014   Dyslipidemia 03/17/2014   Essential hypertension 03/17/2014   Diabetic neuropathy, painful ([place]) 03/21/2013   Hyperlipidemia associated with type 2 diabetes mellitus ([place]) 01/22/2013   A C DEGENERATION-CHRONIC 10/27/2008   IMPINGEMENT SYNDROME 05/01/2008    Allergies:  Allergies  Allergen Reactions   Tomato Itching  and Rash   Medications:  Current Outpatient Medications:    meloxicam (MOBIC) 15 MG tablet, Take 1 tablet (15 mg total) by mouth daily., Disp: 30 tablet, Rfl: 0   albuterol (VENTOLIN HFA) 108 (90 Base) MCG/ACT inhaler, Inhale 2 puffs into the lungs every 6 (six) hours as needed for wheezing or shortness of breath., Disp: 8 g, Rfl: 0   aspirin EC 81 MG tablet, Take 1 tablet (81 mg total) by mouth daily.,  Disp: 30 tablet, Rfl: 5   benzonatate (TESSALON) 100 MG capsule, Take 1 capsule (100 mg total) by mouth 3 (three) times daily as needed for cough., Disp: 30 capsule, Rfl: 0   dapagliflozin propanediol (FARXIGA) 10 MG TABS tablet, Take 1 tablet (10 mg total) by mouth daily before breakfast., Disp: 30 tablet, Rfl: 0   Dulaglutide (TRULICITY) 3 0000000 SOPN, Inject 3 mg once a week as directed., Disp: 6 mL, Rfl: 1   DULoxetine (CYMBALTA) 60 MG capsule, Take 1 capsule (60 mg total) by mouth daily., Disp: 30 capsule, Rfl: 3   ergocalciferol (DRISDOL) 1.25 MG (50000 UT) capsule, Take 1 capsule (50,000 Units total) by mouth once a week., Disp: 12 capsule, Rfl: 1   gabapentin (NEURONTIN) 300 MG capsule, Take 2 capsules (600 mg total) by mouth 3 (three) times daily., Disp: 180 capsule, Rfl: 2   glucose blood (TRUE METRIX BLOOD GLUCOSE TEST) test strip, Use as instructed (Patient taking differently: 1 each by Other route as directed.), Disp: 100 each, Rfl: 12   insulin glargine-yfgn (SEMGLEE, YFGN,) 100 UNIT/ML Pen, Inject twice daily 36 units in the morning and 30 units in the evening, Disp: 30 mL, Rfl: 3   insulin lispro (HUMALOG) 100 UNIT/ML injection, Inject 0-0.12 mLs (0-12 Units total) into the skin 3 (three) times daily with meals. As per sliding scale, Disp: 10 mL, Rfl: 3   linaclotide (LINZESS) 72 MCG capsule, Take 1 capsule (72 mcg total) by mouth daily before breakfast., Disp: 30 capsule, Rfl: 3   lisinopril-hydrochlorothiazide (ZESTORETIC) 20-12.5 MG tablet, TAKE 2 TABLETS BY MOUTH ONCE DAILY., Disp: 180 tablet, Rfl: 1   metFORMIN (GLUCOPHAGE) 1000 MG tablet, Take 1 tablet by mouth 2 (two) times daily with a meal., Disp: 180 tablet, Rfl: 0   metoprolol succinate (TOPROL-XL) 100 MG 24 hr tablet, Take 1 tablet (100 mg total) by mouth daily. APPOINTMENT NEEDED FOR FUTURE REFILLS, Disp: 90 tablet, Rfl: 0   rosuvastatin (CRESTOR) 40 MG tablet, Take 1 tablet (40 mg total) by mouth daily., Disp: 90 tablet,  Rfl: 1   TRUEplus Lancets 28G MISC, USE AS INSTRUCTED (Patient taking differently: 1 each by Other route as directed.), Disp: 100 each, Rfl: 5  Observations/Objective: Patient is well-developed, well-nourished in no acute distress.  Resting comfortably at home.  Head is normocephalic, atraumatic.  No labored breathing.  Speech is clear and coherent with logical content.  Patient is alert and oriented at baseline.    Assessment and Plan: 1. Chronic pain of left knee - meloxicam (MOBIC) 15 MG tablet; Take 1 tablet (15 mg total) by mouth daily.  Dispense: 30 tablet; Refill: 0 RICE instructions given Follow-up with orthopedic specialist  Follow Up Instructions: I discussed the assessment and treatment plan with the patient. The patient was provided an opportunity to ask questions and all were answered. The patient agreed with the plan and demonstrated an understanding of the instructions.  A copy of instructions were sent to the patient via MyChart unless otherwise noted below.    The  patient was advised to call back or seek an in-person evaluation if the symptoms worsen or if the condition fails to improve as anticipated.  Time:  I spent 11 minutes with the patient via telehealth technology discussing the above problems/concerns.    Gildardo Pounds, NP

## 2022-05-08 NOTE — Patient Instructions (Signed)
Mariah Miller, thank you for joining Gildardo Pounds, NP for today's virtual visit.  While this provider is not your primary care provider (PCP), if your PCP is located in our provider database this encounter information will be shared with them immediately following your visit.   [facility] account gives you access to today's visit and all your visits, tests, and labs performed at [facility] " click here if you don't have a [facility] account or go to [email or web address]  Consent: (Patient) Mariah Miller provided verbal consent for this virtual visit at the beginning of the encounter.  Current Medications:  Current Outpatient Medications:    meloxicam (MOBIC) 15 MG tablet, Take 1 tablet (15 mg total) by mouth daily., Disp: 30 tablet, Rfl: 0   albuterol (VENTOLIN HFA) 108 (90 Base) MCG/ACT inhaler, Inhale 2 puffs into the lungs every 6 (six) hours as needed for wheezing or shortness of breath., Disp: 8 g, Rfl: 0   aspirin EC 81 MG tablet, Take 1 tablet (81 mg total) by mouth daily., Disp: 30 tablet, Rfl: 5   benzonatate (TESSALON) 100 MG capsule, Take 1 capsule (100 mg total) by mouth 3 (three) times daily as needed for cough., Disp: 30 capsule, Rfl: 0   dapagliflozin propanediol (FARXIGA) 10 MG TABS tablet, Take 1 tablet (10 mg total) by mouth daily before breakfast., Disp: 30 tablet, Rfl: 0   Dulaglutide (TRULICITY) 3 0000000 SOPN, Inject 3 mg once a week as directed., Disp: 6 mL, Rfl: 1   DULoxetine (CYMBALTA) 60 MG capsule, Take 1 capsule (60 mg total) by mouth daily., Disp: 30 capsule, Rfl: 3   ergocalciferol (DRISDOL) 1.25 MG (50000 UT) capsule, Take 1 capsule (50,000 Units total) by mouth once a week., Disp: 12 capsule, Rfl: 1   gabapentin (NEURONTIN) 300 MG capsule, Take 2 capsules (600 mg total) by mouth 3 (three) times daily., Disp: 180 capsule, Rfl: 2   glucose blood (TRUE METRIX BLOOD GLUCOSE TEST) test strip, Use as instructed  (Patient taking differently: 1 each by Other route as directed.), Disp: 100 each, Rfl: 12   insulin glargine-yfgn (SEMGLEE, YFGN,) 100 UNIT/ML Pen, Inject twice daily 36 units in the morning and 30 units in the evening, Disp: 30 mL, Rfl: 3   insulin lispro (HUMALOG) 100 UNIT/ML injection, Inject 0-0.12 mLs (0-12 Units total) into the skin 3 (three) times daily with meals. As per sliding scale, Disp: 10 mL, Rfl: 3   linaclotide (LINZESS) 72 MCG capsule, Take 1 capsule (72 mcg total) by mouth daily before breakfast., Disp: 30 capsule, Rfl: 3   lisinopril-hydrochlorothiazide (ZESTORETIC) 20-12.5 MG tablet, TAKE 2 TABLETS BY MOUTH ONCE DAILY., Disp: 180 tablet, Rfl: 1   metFORMIN (GLUCOPHAGE) 1000 MG tablet, Take 1 tablet by mouth 2 (two) times daily with a meal., Disp: 180 tablet, Rfl: 0   metoprolol succinate (TOPROL-XL) 100 MG 24 hr tablet, Take 1 tablet (100 mg total) by mouth daily. APPOINTMENT NEEDED FOR FUTURE REFILLS, Disp: 90 tablet, Rfl: 0   rosuvastatin (CRESTOR) 40 MG tablet, Take 1 tablet (40 mg total) by mouth daily., Disp: 90 tablet, Rfl: 1   TRUEplus Lancets 28G MISC, USE AS INSTRUCTED (Patient taking differently: 1 each by Other route as directed.), Disp: 100 each, Rfl: 5   Medications ordered in this encounter:  Meds ordered this encounter  Medications   meloxicam (MOBIC) 15 MG tablet    Sig: Take 1 tablet (15 mg total) by mouth daily.    Dispense:  30 tablet    Refill:  0    Order Specific Question:   Supervising Provider    Answer:   Chase Picket [id number]     *If you need refills on other medications prior to your next appointment, please contact your pharmacy*  Follow-Up: Call back or seek an in-person evaluation if the symptoms worsen or if the condition fails to improve as anticipated.  [facility] [phone]  Other Instructions RICE instructions given Follow-up with orthopedic specialist   If you have been instructed to have an in-person  evaluation today at a local Urgent Care facility, please use the link below. It will take you to a list of all of our available [facility], including address, phone number and hours of operation. Please do not delay care.  [facility]  If you or a family member do not have a primary care provider, use the link below to schedule a visit and establish care. When you choose a [facility] primary care physician or advanced practice provider, you gain a long-term partner in health. Find a Primary Care Provider  Learn more about [facility] in-office and virtual care options: [facility]

## 2022-05-17 ENCOUNTER — Other Ambulatory Visit: Payer: Self-pay

## 2022-05-17 ENCOUNTER — Telehealth: Payer: Managed Care, Other (non HMO) | Admitting: Physician Assistant

## 2022-05-17 DIAGNOSIS — I10 Essential (primary) hypertension: Secondary | ICD-10-CM | POA: Diagnosis not present

## 2022-05-17 MED ORDER — AMLODIPINE BESYLATE 5 MG PO TABS
5.0000 mg | ORAL_TABLET | Freq: Every day | ORAL | 0 refills | Status: DC
  Filled 2022-05-17: qty 30, 30d supply, fill #0

## 2022-05-17 NOTE — Progress Notes (Signed)
Virtual Visit Consent   Mariah Miller, you are scheduled for a virtual visit with a [facility] provider today. Just as with appointments in the office, your consent must be obtained to participate. Your consent will be active for this visit and any virtual visit you may have with one of our providers in the next 365 days. If you have a MyChart account, a copy of this consent can be sent to you electronically.  As this is a virtual visit, video technology does not allow for your provider to perform a traditional examination. This may limit your provider's ability to fully assess your condition. If your provider identifies any concerns that need to be evaluated in person or the need to arrange testing (such as labs, EKG, etc.), we will make arrangements to do so. Although advances in technology are sophisticated, we cannot ensure that it will always work on either your end or our end. If the connection with a video visit is poor, the visit may have to be switched to a telephone visit. With either a video or telephone visit, we are not always able to ensure that we have a secure connection.  By engaging in this virtual visit, you consent to the provision of healthcare and authorize for your insurance to be billed (if applicable) for the services provided during this visit. Depending on your insurance coverage, you may receive a charge related to this service.  I need to obtain your verbal consent now. Are you willing to proceed with your visit today? Mariah Miller has provided verbal consent on 05/17/2022 for a virtual visit (video or telephone). Mariah Daring, PA-C  Date: 05/17/2022 9:23 AM  Virtual Visit via Video Note   I, Mariah Miller, connected with  Mariah Miller  ([id number], 03-26-1975) on 05/17/22 at  9:15 AM EDT by a video-enabled telemedicine application and verified that I am speaking with the correct person using two identifiers.  Location: Patient: Virtual Visit  Location Patient: Home Provider: Virtual Visit Location Provider: Home Office   I discussed the limitations of evaluation and management by telemedicine and the availability of in person appointments. The patient expressed understanding and agreed to proceed.    History of Present Illness: Mariah Miller is a 47 y.o. who identifies as a female who was assigned female at birth, and is being seen today for elevated blood pressure. Started having blurry vision at work. Denies chest pain, nausea, vomiting. Having slight headaches. BP was 189/89, was 189/100 at work. Has been taking Lisinopril-HCTZ 20-12.'5mg'$  Takes 2 tablets daily. BP last checked at PCP office was 148/85 on 04/27/22.   Problems:  Patient Active Problem List   Diagnosis Date Noted   Acute recurrent maxillary sinusitis 03/17/2021   Pain in joint of left knee 12/14/2020   Migraine 10/01/2020   Eczema 05/26/2017   Primary insomnia 09/28/2016   Anxiety and depression 05/26/2016   OSA (obstructive sleep apnea) 04/30/2015   Uncontrolled type 2 diabetes mellitus with hyperglycemia, with long-term current use of insulin ([place]) 04/28/2015   ASCUS with positive high risk HPV cervical 06/06/2014   Dyslipidemia 03/17/2014   Essential hypertension 03/17/2014   Diabetic neuropathy, painful ([place]) 03/21/2013   Hyperlipidemia associated with type 2 diabetes mellitus ([place]) 01/22/2013   A C DEGENERATION-CHRONIC 10/27/2008   IMPINGEMENT SYNDROME 05/01/2008    Allergies:  Allergies  Allergen Reactions   Tomato Itching and Rash   Medications:  Current Outpatient Medications:    amLODipine (NORVASC) 5 MG  tablet, Take 1 tablet (5 mg total) by mouth daily., Disp: 90 tablet, Rfl: 0   albuterol (VENTOLIN HFA) 108 (90 Base) MCG/ACT inhaler, Inhale 2 puffs into the lungs every 6 (six) hours as needed for wheezing or shortness of breath., Disp: 8 g, Rfl: 0   benzonatate (TESSALON) 100 MG capsule, Take 1 capsule (100 mg total) by mouth 3 (three)  times daily as needed for cough., Disp: 30 capsule, Rfl: 0   dapagliflozin propanediol (FARXIGA) 10 MG TABS tablet, Take 1 tablet (10 mg total) by mouth daily before breakfast., Disp: 30 tablet, Rfl: 0   Dulaglutide (TRULICITY) 3 0000000 SOPN, Inject 3 mg once a week as directed., Disp: 6 mL, Rfl: 1   DULoxetine (CYMBALTA) 60 MG capsule, Take 1 capsule (60 mg total) by mouth daily., Disp: 30 capsule, Rfl: 3   ergocalciferol (DRISDOL) 1.25 MG (50000 UT) capsule, Take 1 capsule (50,000 Units total) by mouth once a week., Disp: 12 capsule, Rfl: 1   gabapentin (NEURONTIN) 300 MG capsule, Take 2 capsules (600 mg total) by mouth 3 (three) times daily., Disp: 180 capsule, Rfl: 2   glucose blood (TRUE METRIX BLOOD GLUCOSE TEST) test strip, Use as instructed (Patient taking differently: 1 each by Other route as directed.), Disp: 100 each, Rfl: 12   insulin glargine-yfgn (SEMGLEE, YFGN,) 100 UNIT/ML Pen, Inject twice daily 36 units in the morning and 30 units in the evening, Disp: 30 mL, Rfl: 3   insulin lispro (HUMALOG) 100 UNIT/ML injection, Inject 0-0.12 mLs (0-12 Units total) into the skin 3 (three) times daily with meals. As per sliding scale, Disp: 10 mL, Rfl: 3   linaclotide (LINZESS) 72 MCG capsule, Take 1 capsule (72 mcg total) by mouth daily before breakfast., Disp: 30 capsule, Rfl: 3   lisinopril-hydrochlorothiazide (ZESTORETIC) 20-12.5 MG tablet, TAKE 2 TABLETS BY MOUTH ONCE DAILY., Disp: 180 tablet, Rfl: 1   meloxicam (MOBIC) 15 MG tablet, Take 1 tablet (15 mg total) by mouth daily., Disp: 30 tablet, Rfl: 0   metFORMIN (GLUCOPHAGE) 1000 MG tablet, Take 1 tablet by mouth 2 (two) times daily with a meal., Disp: 180 tablet, Rfl: 0   metoprolol succinate (TOPROL-XL) 100 MG 24 hr tablet, Take 1 tablet (100 mg total) by mouth daily. APPOINTMENT NEEDED FOR FUTURE REFILLS, Disp: 90 tablet, Rfl: 0   rosuvastatin (CRESTOR) 40 MG tablet, Take 1 tablet (40 mg total) by mouth daily., Disp: 90 tablet, Rfl: 1    TRUEplus Lancets 28G MISC, USE AS INSTRUCTED (Patient taking differently: 1 each by Other route as directed.), Disp: 100 each, Rfl: 5  Observations/Objective: Patient is well-developed, well-nourished in no acute distress.  Resting comfortably at home.  Head is normocephalic, atraumatic.  No labored breathing.  Speech is clear and coherent with logical content.  Patient is alert and oriented at baseline.    Assessment and Plan: 1. Primary hypertension - amLODipine (NORVASC) 5 MG tablet; Take 1 tablet (5 mg total) by mouth daily.  Dispense: 90 tablet; Refill: 0  - Elevated readings with blurred vision and mild headache - Added Amlodipine - Continue Lisinopril-HCTZ as prescribed - Keep scheduled follow up with PCP on 06/30/22 - Seek in person evaluation if symptoms worsen  Follow Up Instructions: I discussed the assessment and treatment plan with the patient. The patient was provided an opportunity to ask questions and all were answered. The patient agreed with the plan and demonstrated an understanding of the instructions.  A copy of instructions were sent to the patient via  MyChart unless otherwise noted below.    The patient was advised to call back or seek an in-person evaluation if the symptoms worsen or if the condition fails to improve as anticipated.  Time:  I spent 10 minutes with the patient via telehealth technology discussing the above problems/concerns.    Mariah Daring, PA-C

## 2022-05-17 NOTE — Patient Instructions (Signed)
Mariah Miller, thank you for joining [name], PA-C for today's virtual visit.  While this provider is not your primary care provider (PCP), if your PCP is located in our provider database this encounter information will be shared with them immediately following your visit.   [facility] account gives you access to today's visit and all your visits, tests, and labs performed at [facility] " click here if you don't have a [facility] account or go to [email or web address]  Consent: (Patient) Mariah Miller provided verbal consent for this virtual visit at the beginning of the encounter.  Current Medications:  Current Outpatient Medications:    amLODipine (NORVASC) 5 MG tablet, Take 1 tablet (5 mg total) by mouth daily., Disp: 90 tablet, Rfl: 0   albuterol (VENTOLIN HFA) 108 (90 Base) MCG/ACT inhaler, Inhale 2 puffs into the lungs every 6 (six) hours as needed for wheezing or shortness of breath., Disp: 8 g, Rfl: 0   benzonatate (TESSALON) 100 MG capsule, Take 1 capsule (100 mg total) by mouth 3 (three) times daily as needed for cough., Disp: 30 capsule, Rfl: 0   dapagliflozin propanediol (FARXIGA) 10 MG TABS tablet, Take 1 tablet (10 mg total) by mouth daily before breakfast., Disp: 30 tablet, Rfl: 0   Dulaglutide (TRULICITY) 3 0000000 SOPN, Inject 3 mg once a week as directed., Disp: 6 mL, Rfl: 1   DULoxetine (CYMBALTA) 60 MG capsule, Take 1 capsule (60 mg total) by mouth daily., Disp: 30 capsule, Rfl: 3   ergocalciferol (DRISDOL) 1.25 MG (50000 UT) capsule, Take 1 capsule (50,000 Units total) by mouth once a week., Disp: 12 capsule, Rfl: 1   gabapentin (NEURONTIN) 300 MG capsule, Take 2 capsules (600 mg total) by mouth 3 (three) times daily., Disp: 180 capsule, Rfl: 2   glucose blood (TRUE METRIX BLOOD GLUCOSE TEST) test strip, Use as instructed (Patient taking differently: 1 each by Other route as directed.), Disp: 100 each, Rfl: 12    insulin glargine-yfgn (SEMGLEE, YFGN,) 100 UNIT/ML Pen, Inject twice daily 36 units in the morning and 30 units in the evening, Disp: 30 mL, Rfl: 3   insulin lispro (HUMALOG) 100 UNIT/ML injection, Inject 0-0.12 mLs (0-12 Units total) into the skin 3 (three) times daily with meals. As per sliding scale, Disp: 10 mL, Rfl: 3   linaclotide (LINZESS) 72 MCG capsule, Take 1 capsule (72 mcg total) by mouth daily before breakfast., Disp: 30 capsule, Rfl: 3   lisinopril-hydrochlorothiazide (ZESTORETIC) 20-12.5 MG tablet, TAKE 2 TABLETS BY MOUTH ONCE DAILY., Disp: 180 tablet, Rfl: 1   meloxicam (MOBIC) 15 MG tablet, Take 1 tablet (15 mg total) by mouth daily., Disp: 30 tablet, Rfl: 0   metFORMIN (GLUCOPHAGE) 1000 MG tablet, Take 1 tablet by mouth 2 (two) times daily with a meal., Disp: 180 tablet, Rfl: 0   metoprolol succinate (TOPROL-XL) 100 MG 24 hr tablet, Take 1 tablet (100 mg total) by mouth daily. APPOINTMENT NEEDED FOR FUTURE REFILLS, Disp: 90 tablet, Rfl: 0   rosuvastatin (CRESTOR) 40 MG tablet, Take 1 tablet (40 mg total) by mouth daily., Disp: 90 tablet, Rfl: 1   TRUEplus Lancets 28G MISC, USE AS INSTRUCTED (Patient taking differently: 1 each by Other route as directed.), Disp: 100 each, Rfl: 5   Medications ordered in this encounter:  Meds ordered this encounter  Medications   amLODipine (NORVASC) 5 MG tablet    Sig: Take 1 tablet (5 mg total) by mouth daily.    Dispense:  90 tablet    Refill:  0    Order Specific Question:   Supervising Provider    Answer:   Chase Picket [id number]     *If you need refills on other medications prior to your next appointment, please contact your pharmacy*  Follow-Up: Call back or seek an in-person evaluation if the symptoms worsen or if the condition fails to improve as anticipated.  [place] [phone]  Other Instructions  Amlodipine Tablets What is this medication? AMLODIPINE (am LOE di peen) treats high blood pressure and  prevents chest pain (angina). It works by relaxing the blood vessels, which helps decrease the amount of work your heart has to do. It belongs to a group of medications called calcium channel blockers. This medicine may be used for other purposes; ask your health care provider or pharmacist if you have questions. COMMON BRAND NAME(S): Norvasc What should I tell my care team before I take this medication? They need to know if you have any of these conditions: Heart disease Liver disease An unusual or allergic reaction to amlodipine, other medications, foods, dyes, or preservatives Pregnant or trying to get pregnant Breastfeeding How should I use this medication? Take this medication by mouth. Take it as directed on the prescription label at the same time every day. You can take it with or without food. If it upsets your stomach, take it with food. Keep taking it unless your care team tells you to stop. Talk to your care team about the use of this medication in children. While it may be prescribed for children as young as 6 for selected conditions, precautions do apply. Overdosage: If you think you have taken too much of this medicine contact a poison control center or emergency room at once. NOTE: This medicine is only for you. Do not share this medicine with others. What if I miss a dose? If you miss a dose, take it as soon as you can. If it is almost time for your next dose, take only that dose. Do not take double or extra doses. What may interact with this medication? Clarithromycin Cyclosporine Diltiazem Itraconazole Simvastatin Tacrolimus This list may not describe all possible interactions. Give your health care provider a list of all the medicines, herbs, non-prescription drugs, or dietary supplements you use. Also tell them if you smoke, drink alcohol, or use illegal drugs. Some items may interact with your medicine. What should I watch for while using this medication? Visit your  care team for regular checks on your progress. Check your blood pressure as directed. Know what your blood pressure should be and when to contact your care team. Do not treat yourself for coughs, colds, or pain while you are using this medication without asking your care team for advice. Some medications may increase your blood pressure. This medication may affect your coordination, reaction time, or judgment. Do not drive or operate machinery until you know how this medication affects you. Sit up or stand slowly to reduce the risk of dizzy or fainting spells. Drinking alcohol with this medication can increase the risk of these side effects. What side effects may I notice from receiving this medication? Side effects that you should report to your care team as soon as possible: Allergic reactions--skin rash, itching, hives, swelling of the face, lips, tongue, or throat Heart attack--pain or tightness in the chest, shoulders, arms, or jaw, nausea, shortness of breath, cold or clammy skin, feeling faint or lightheaded Low blood pressure--dizziness,  feeling faint or lightheaded, blurry vision Worsening chest pain (angina)--pain, pressure, or tightness in the chest, neck, back, or arms Side effects that usually do not require medical attention (report these to your care team if they continue or are bothersome): Facial flushing, redness Heart palpitations--rapid, pounding, or irregular heartbeat Nausea Stomach pain Swelling of the ankles, hands, or feet This list may not describe all possible side effects. Call your doctor for medical advice about side effects. You may report side effects to FDA at 1-800-FDA-1088. Where should I keep my medication? Keep out of the reach of children and pets. Store at room temperature between 20 and 25 degrees C (68 and 77 degrees F). Protect from light and moisture. Keep the container tightly closed. Get rid of any unused medication after the expiration date. To get rid  of medications that are no longer needed or have expired: Take the medication to a medication take-back program. Check with your pharmacy or law enforcement to find a location. If you cannot return the medication, check the label or package insert to see if the medication should be thrown out in the garbage or flushed down the toilet. If you are not sure, ask your care team. If it is safe to put in the trash, empty the medication out of the container. Mix the medication with cat litter, dirt, coffee grounds, or other unwanted substance. Seal the mixture in a bag or container. Put it in the trash. NOTE: This sheet is a summary. It may not cover all possible information. If you have questions about this medicine, talk to your doctor, pharmacist, or health care provider.  2023 Elsevier/Gold Standard (2021-09-13 00:00:00)    If you have been instructed to have an in-person evaluation today at a local Urgent Care facility, please use the link below. It will take you to a list of all of our available [facility], including address, phone number and hours of operation. Please do not delay care.  [facility]  If you or a family member do not have a primary care provider, use the link below to schedule a visit and establish care. When you choose a [facility] primary care physician or advanced practice provider, you gain a long-term partner in health. Find a Primary Care Provider  Learn more about [facility]'s in-office and virtual care options: [facility] Now

## 2022-05-22 ENCOUNTER — Other Ambulatory Visit: Payer: Self-pay | Admitting: Physician Assistant

## 2022-05-22 ENCOUNTER — Other Ambulatory Visit: Payer: Self-pay | Admitting: Family Medicine

## 2022-05-22 DIAGNOSIS — E1142 Type 2 diabetes mellitus with diabetic polyneuropathy: Secondary | ICD-10-CM

## 2022-05-22 DIAGNOSIS — I152 Hypertension secondary to endocrine disorders: Secondary | ICD-10-CM

## 2022-05-23 ENCOUNTER — Other Ambulatory Visit: Payer: Self-pay

## 2022-05-23 ENCOUNTER — Other Ambulatory Visit (HOSPITAL_COMMUNITY): Payer: Self-pay

## 2022-05-23 MED ORDER — LISINOPRIL-HYDROCHLOROTHIAZIDE 20-12.5 MG PO TABS
2.0000 | ORAL_TABLET | Freq: Every day | ORAL | 0 refills | Status: DC
  Filled 2022-05-23: qty 60, 30d supply, fill #0
  Filled 2022-05-23 (×2): qty 180, 90d supply, fill #0
  Filled 2022-05-23: qty 60, 30d supply, fill #0

## 2022-05-23 MED ORDER — DAPAGLIFLOZIN PROPANEDIOL 10 MG PO TABS
10.0000 mg | ORAL_TABLET | Freq: Every day | ORAL | 0 refills | Status: DC
  Filled 2022-05-23 (×2): qty 30, 30d supply, fill #0

## 2022-05-24 ENCOUNTER — Other Ambulatory Visit (HOSPITAL_COMMUNITY): Payer: Self-pay

## 2022-06-30 ENCOUNTER — Ambulatory Visit: Payer: Managed Care, Other (non HMO) | Admitting: Family Medicine

## 2022-08-29 ENCOUNTER — Other Ambulatory Visit: Payer: Self-pay | Admitting: Family Medicine

## 2022-08-29 ENCOUNTER — Other Ambulatory Visit: Payer: Self-pay

## 2022-08-29 DIAGNOSIS — Z794 Long term (current) use of insulin: Secondary | ICD-10-CM

## 2022-08-29 DIAGNOSIS — E1142 Type 2 diabetes mellitus with diabetic polyneuropathy: Secondary | ICD-10-CM

## 2022-08-30 ENCOUNTER — Other Ambulatory Visit: Payer: Self-pay

## 2022-08-30 ENCOUNTER — Other Ambulatory Visit (HOSPITAL_COMMUNITY): Payer: Self-pay

## 2022-08-30 MED ORDER — TRULICITY 3 MG/0.5ML ~~LOC~~ SOAJ
3.0000 mg | SUBCUTANEOUS | 0 refills | Status: DC
  Filled 2022-08-30: qty 6, 84d supply, fill #0

## 2022-08-30 NOTE — Telephone Encounter (Signed)
Requested Prescriptions  Pending Prescriptions Disp Refills   Dulaglutide (TRULICITY) 3 MG/0.5ML SOPN 6 mL 0    Sig: Inject 3 mg once a week as directed.     Endocrinology:  Diabetes - GLP-1 Receptor Agonists Failed - 08/29/2022  2:28 PM      Failed - HBA1C is between 0 and 7.9 and within 180 days    HbA1c, POC (controlled diabetic range)  Date Value Ref Range Status  03/30/2022 8.9 (A) 0.0 - 7.0 % Final         Passed - Valid encounter within last 6 months    Recent Outpatient Visits           4 months ago Annual visit for general adult medical examination with abnormal findings   [facility] [place], [place], MD   5 months ago Type 2 diabetes mellitus with diabetic polyneuropathy, with long-term current use of insulin (HCC)   [place] [facility] [place], [place], MD   11 months ago Type 2 diabetes mellitus with diabetic polyneuropathy, with long-term current use of insulin (HCC)   [place] [facility] [place], [place], MD   11 months ago Type 2 diabetes mellitus with diabetic polyneuropathy, with long-term current use of insulin (HCC)   [place] [facility] [place], [place], MD   1 year ago Type 2 diabetes mellitus with diabetic polyneuropathy, with long-term current use of insulin (HCC)   [facility] Hoy Register, MD       Future Appointments             In 1 week Hoy Register, MD [facility]

## 2022-09-12 ENCOUNTER — Ambulatory Visit: Payer: Managed Care, Other (non HMO) | Admitting: Family Medicine

## 2022-09-27 ENCOUNTER — Ambulatory Visit: Payer: Managed Care, Other (non HMO) | Admitting: Family Medicine

## 2022-11-11 ENCOUNTER — Encounter: Payer: Self-pay | Admitting: Pharmacist

## 2022-11-21 ENCOUNTER — Ambulatory Visit: Payer: Managed Care, Other (non HMO) | Attending: Family Medicine | Admitting: Family Medicine

## 2022-11-21 ENCOUNTER — Other Ambulatory Visit: Payer: Self-pay

## 2022-11-21 VITALS — BP 157/83 | HR 78 | Ht 65.0 in | Wt 208.6 lb

## 2022-11-21 DIAGNOSIS — E785 Hyperlipidemia, unspecified: Secondary | ICD-10-CM

## 2022-11-21 DIAGNOSIS — E1169 Type 2 diabetes mellitus with other specified complication: Secondary | ICD-10-CM | POA: Diagnosis not present

## 2022-11-21 DIAGNOSIS — R109 Unspecified abdominal pain: Secondary | ICD-10-CM | POA: Diagnosis not present

## 2022-11-21 DIAGNOSIS — F439 Reaction to severe stress, unspecified: Secondary | ICD-10-CM

## 2022-11-21 DIAGNOSIS — E1142 Type 2 diabetes mellitus with diabetic polyneuropathy: Secondary | ICD-10-CM | POA: Diagnosis not present

## 2022-11-21 DIAGNOSIS — Z794 Long term (current) use of insulin: Secondary | ICD-10-CM | POA: Diagnosis not present

## 2022-11-21 DIAGNOSIS — I152 Hypertension secondary to endocrine disorders: Secondary | ICD-10-CM

## 2022-11-21 DIAGNOSIS — E1159 Type 2 diabetes mellitus with other circulatory complications: Secondary | ICD-10-CM | POA: Diagnosis not present

## 2022-11-21 LAB — POCT GLYCOSYLATED HEMOGLOBIN (HGB A1C): HbA1c, POC (controlled diabetic range): 11 % — AB (ref 0.0–7.0)

## 2022-11-21 LAB — GLUCOSE, POCT (MANUAL RESULT ENTRY): POC Glucose: 157 mg/dL — AB (ref 70–99)

## 2022-11-21 MED ORDER — TIRZEPATIDE 10 MG/0.5ML ~~LOC~~ SOAJ
10.0000 mg | SUBCUTANEOUS | 0 refills | Status: DC
  Filled 2022-11-21: qty 6, 84d supply, fill #0
  Filled 2023-01-31: qty 2, 28d supply, fill #0

## 2022-11-21 MED ORDER — METFORMIN HCL 1000 MG PO TABS
1000.0000 mg | ORAL_TABLET | Freq: Two times a day (BID) | ORAL | 1 refills | Status: DC
  Filled 2022-11-21: qty 60, 30d supply, fill #0
  Filled 2023-05-08: qty 60, 30d supply, fill #1

## 2022-11-21 MED ORDER — TIRZEPATIDE 12.5 MG/0.5ML ~~LOC~~ SOAJ
12.5000 mg | SUBCUTANEOUS | 6 refills | Status: DC
  Filled 2022-11-21: qty 6, 84d supply, fill #0
  Filled 2023-03-26: qty 2, 28d supply, fill #0
  Filled 2023-05-08: qty 2, 28d supply, fill #1

## 2022-11-21 MED ORDER — INSULIN GLARGINE-YFGN 100 UNIT/ML ~~LOC~~ SOPN
PEN_INJECTOR | SUBCUTANEOUS | 3 refills | Status: DC
Start: 2022-11-21 — End: 2023-05-08
  Filled 2022-11-21: qty 21, 29d supply, fill #0
  Filled 2023-01-31: qty 21, 29d supply, fill #1
  Filled 2023-03-26: qty 21, 29d supply, fill #2

## 2022-11-21 MED ORDER — TIRZEPATIDE 7.5 MG/0.5ML ~~LOC~~ SOAJ
7.5000 mg | SUBCUTANEOUS | 0 refills | Status: DC
  Filled 2022-11-21: qty 2, 28d supply, fill #0

## 2022-11-21 MED ORDER — DAPAGLIFLOZIN PROPANEDIOL 10 MG PO TABS
10.0000 mg | ORAL_TABLET | Freq: Every day | ORAL | 1 refills | Status: DC
  Filled 2022-11-21: qty 30, 30d supply, fill #0
  Filled 2023-05-08: qty 30, 30d supply, fill #1

## 2022-11-21 MED ORDER — METOPROLOL SUCCINATE ER 100 MG PO TB24
100.0000 mg | ORAL_TABLET | Freq: Every day | ORAL | 1 refills | Status: DC
  Filled 2022-11-21: qty 30, 30d supply, fill #0
  Filled 2023-05-08: qty 30, 30d supply, fill #1

## 2022-11-21 MED ORDER — ROSUVASTATIN CALCIUM 40 MG PO TABS
40.0000 mg | ORAL_TABLET | Freq: Every day | ORAL | 1 refills | Status: DC
Start: 2022-11-21 — End: 2023-05-08
  Filled 2022-11-21: qty 30, 30d supply, fill #0
  Filled 2023-05-08: qty 30, 30d supply, fill #1

## 2022-11-21 MED ORDER — LISINOPRIL-HYDROCHLOROTHIAZIDE 20-12.5 MG PO TABS
2.0000 | ORAL_TABLET | Freq: Every day | ORAL | 1 refills | Status: DC
  Filled 2022-11-21: qty 60, 30d supply, fill #0
  Filled 2023-05-08: qty 60, 30d supply, fill #1

## 2022-11-21 MED ORDER — AMLODIPINE BESYLATE 10 MG PO TABS
10.0000 mg | ORAL_TABLET | Freq: Every day | ORAL | 1 refills | Status: DC
  Filled 2022-11-21: qty 30, 30d supply, fill #0
  Filled 2023-05-08: qty 30, 30d supply, fill #1

## 2022-11-21 NOTE — Progress Notes (Signed)
Subjective:  Patient ID: Mariah Miller, female    DOB: January 01, 1976  Age: 47 y.o. MRN: [id number]  CC: Medical Management of Chronic Issues (Pt states the medication Trulicity is causing her to have stomach issues.)   HPI Mariah Miller is a 47 y.o. year old female with a history of  Type 2 diabetes mellitus (A1c 11.0), hypertension, depression.    Interval History: Discussed the use of AI scribe software for clinical note transcription with the patient, who gave verbal consent to proceed.  She presents with severe stomach pain that started after initiating Trulicity for diabetes management. The pain is so severe that it has affected her appetite, leading to decreased food intake. The patient reports that she has been taking Trulicity for less than a year, and the stomach pain started recently. She has continued to take the medication despite the discomfort, but stopped when the pain became unbearable.  The patient's blood sugar control has been poor, with a recent A1c level of 11. She reports that her blood sugar levels have been high, with the highest reading being 300. The patient acknowledges that her diet and stress levels have been contributing factors to her poor blood sugar control.  In addition to her diabetes, the patient has been dealing with high blood pressure. She has been experiencing headaches, which she attributes to her elevated blood pressure. The patient has been under significant stress due to the illness of her mother and grandmother, which has required her to travel frequently and manage her family's healthcare.        Past Medical History:  Diagnosis Date   Depression    Diabetes mellitus without complication (HCC)    Hyperlipidemia    Hypertension    Migraine    Seizures (HCC)    self reported - last seizure was Oct 2019 "absent seizure"    Past Surgical History:  Procedure Laterality Date   CHOLECYSTECTOMY     CYST EXCISION     SHOULDER OPEN  ROTATOR CUFF REPAIR Left    TONSILLECTOMY     TUBAL LIGATION      Family History  Problem Relation Age of Onset   Hypertension Mother    Thyroid disease Mother    Irregular heart beat Mother    Bipolar disorder Sister    Colon cancer Other        great uncle   Esophageal cancer Neg Hx    Rectal cancer Neg Hx    Stomach cancer Neg Hx     Social History   Socioeconomic History   Marital status: Divorced    Spouse name: Not on file   Number of children: Not on file   Years of education: Not on file   Highest education level: Not on file  Occupational History    Comment: Assembler   Tobacco Use   Smoking status: Former    Types: Cigarettes   Smokeless tobacco: Never   Tobacco comments:    Smoked for 4 years, quit age 23  Vaping Use   Vaping status: Never Used  Substance and Sexual Activity   Alcohol use: Not Currently   Drug use: No   Sexual activity: Yes    Birth control/protection: None    Comment: Starteds menstral cycle today 12-12-2018 pt reported  Other Topics Concern   Not on file  Social History Narrative   Not on file   Social Determinants of Health   Financial Resource Strain: Not on file  Food Insecurity:  Not on file  Transportation Needs: Not on file  Physical Activity: Not on file  Stress: Not on file  Social Connections: Not on file    Allergies  Allergen Reactions   Tomato Itching and Rash    Outpatient Medications Prior to Visit  Medication Sig Dispense Refill   albuterol (VENTOLIN HFA) 108 (90 Base) MCG/ACT inhaler Inhale 2 puffs into the lungs every 6 (six) hours as needed for wheezing or shortness of breath. 8 g 0   benzonatate (TESSALON) 100 MG capsule Take 1 capsule (100 mg total) by mouth 3 (three) times daily as needed for cough. 30 capsule 0   DULoxetine (CYMBALTA) 60 MG capsule Take 1 capsule (60 mg total) by mouth daily. 30 capsule 3   ergocalciferol (DRISDOL) 1.25 MG (50000 UT) capsule Take 1 capsule (50,000 Units total) by  mouth once a week. 12 capsule 1   gabapentin (NEURONTIN) 300 MG capsule Take 2 capsules (600 mg total) by mouth 3 (three) times daily. 180 capsule 2   glucose blood (TRUE METRIX BLOOD GLUCOSE TEST) test strip Use as instructed (Patient taking differently: 1 each by Other route as directed.) 100 each 12   insulin lispro (HUMALOG) 100 UNIT/ML injection Inject 0.01 mLs (1 Units total) into the skin as needed for high blood sugar. Sliding scale needed. 10 mL 3   insulin lispro (HUMALOG) 100 UNIT/ML injection Inject 0-0.12 mLs (0-12 Units total) into the skin 3 (three) times daily with meals. As per sliding scale 10 mL 3   linaclotide (LINZESS) 72 MCG capsule Take 1 capsule (72 mcg total) by mouth daily before breakfast. 30 capsule 3   meloxicam (MOBIC) 15 MG tablet Take 1 tablet (15 mg total) by mouth daily. 30 tablet 0   TRUEplus Lancets 28G MISC USE AS INSTRUCTED (Patient taking differently: 1 each by Other route as directed.) 100 each 5   amLODipine (NORVASC) 5 MG tablet Take 1 tablet (5 mg total) by mouth daily. 90 tablet 0   dapagliflozin propanediol (FARXIGA) 10 MG TABS tablet Take 1 tablet (10 mg total) by mouth daily before breakfast. 90 tablet 0   Dulaglutide (TRULICITY) 3 MG/0.5ML SOPN Inject 3 mg once a week as directed. 6 mL 0   insulin glargine-yfgn (SEMGLEE, YFGN,) 100 UNIT/ML Pen Inject twice daily 36 units in the morning and 30 units in the evening 30 mL 3   lisinopril-hydrochlorothiazide (ZESTORETIC) 20-12.5 MG tablet TAKE 2 TABLETS BY MOUTH ONCE DAILY. 180 tablet 0   metFORMIN (GLUCOPHAGE) 1000 MG tablet Take 1 tablet by mouth 2 (two) times daily with a meal. 180 tablet 0   metoprolol succinate (TOPROL-XL) 100 MG 24 hr tablet Take 1 tablet (100 mg total) by mouth daily. APPOINTMENT NEEDED FOR FUTURE REFILLS 90 tablet 0   rosuvastatin (CRESTOR) 40 MG tablet Take 1 tablet (40 mg total) by mouth daily. 90 tablet 1   No facility-administered medications prior to visit.     ROS Review  of Systems  Constitutional:  Negative for activity change and appetite change.  HENT:  Negative for sinus pressure and sore throat.   Respiratory:  Negative for chest tightness, shortness of breath and wheezing.   Cardiovascular:  Negative for chest pain and palpitations.  Gastrointestinal:  Negative for abdominal distention, abdominal pain and constipation.  Genitourinary: Negative.   Musculoskeletal: Negative.   Psychiatric/Behavioral:  Negative for behavioral problems and dysphoric mood.     Objective:  BP (!) 157/83   Pulse 78   Ht 5\' 5"  (  1.651 m)   Wt 208 lb 9.6 oz (94.6 kg)   SpO2 100%   BMI 34.71 kg/m      11/21/2022    2:43 PM 11/21/2022    1:46 PM 04/27/2022    9:37 AM  BP/Weight  Systolic BP 157 144 148  Diastolic BP 83 83 85  Wt. (Lbs)  208.6 206  BMI  34.71 kg/m2 34.28 kg/m2      Physical Exam Constitutional:      Appearance: She is well-developed.  Cardiovascular:     Rate and Rhythm: Normal rate.     Heart sounds: Normal heart sounds. No murmur heard. Pulmonary:     Effort: Pulmonary effort is normal.     Breath sounds: Normal breath sounds. No wheezing or rales.  Chest:     Chest wall: No tenderness.  Abdominal:     General: Bowel sounds are normal. There is no distension.     Palpations: Abdomen is soft. There is no mass.     Tenderness: There is no abdominal tenderness.  Musculoskeletal:        General: Normal range of motion.     Right lower leg: No edema.     Left lower leg: No edema.  Neurological:     Mental Status: She is alert and oriented to person, place, and time.  Psychiatric:        Mood and Affect: Mood normal.        Latest Ref Rng & Units 04/06/2022    8:43 AM 09/20/2021   11:47 AM 11/03/2020    6:48 AM  CMP  Glucose 70 - 99 mg/dL 119  95  147   BUN 6 - 24 mg/dL 6  8  23    Creatinine 0.57 - 1.00 mg/dL 8.29  5.62  1.30   Sodium 134 - 144 mmol/L 140  139  133   Potassium 3.5 - 5.2 mmol/L 4.8  4.5  3.7   Chloride 96 - 106  mmol/L 105  106  98   CO2 20 - 29 mmol/L 21  21  25    Calcium 8.7 - 10.2 mg/dL 9.4  9.1  8.8   Total Protein 6.0 - 8.5 g/dL 6.9  6.6  7.1   Total Bilirubin 0.0 - 1.2 mg/dL 0.2  0.2  0.7   Alkaline Phos 44 - 121 IU/L 81  85  73   AST 0 - 40 IU/L 12  19  19    ALT 0 - 32 IU/L 8  16  14      Lipid Panel     Component Value Date/Time   CHOL 181 04/06/2022 0843   TRIG 70 04/06/2022 0843   HDL 45 04/06/2022 0843   CHOLHDL 4.3 10/28/2020 0956   CHOLHDL 4.7 05/25/2016 1543   VLDL 37 (H) 05/25/2016 1543   LDLCALC 123 (H) 04/06/2022 0843    CBC    Component Value Date/Time   WBC CANCELED 09/20/2021 1147   WBC 8.0 11/03/2020 0648   RBC CANCELED 09/20/2021 1147   RBC 4.16 11/03/2020 0648   HGB CANCELED 09/20/2021 1147   HCT CANCELED 09/20/2021 1147   PLT CANCELED 09/20/2021 1147   MCV 90.9 11/03/2020 0648   MCH 29.6 11/03/2020 0648   MCHC 32.5 11/03/2020 0648   RDW 13.3 11/03/2020 0648   LYMPHSABS CANCELED 09/20/2021 1147   MONOABS 0.5 10/01/2020 0835   EOSABS CANCELED 09/20/2021 1147   BASOSABS CANCELED 09/20/2021 1147    Lab Results  Component Value Date  HGBA1C 11.0 (A) 11/21/2022    Assessment & Plan:      Type 2 Diabetes Mellitus Elevated A1c at 11. Recent discontinuation of Trulicity due to severe abdominal pain. Patient has been experiencing this side effect for about a year. Discussed switching to a different GLP-1 receptor agonist, Mounjaro, which may also aid in further weight loss. -Discontinue Trulicity. -Start Mounjaro 7.5mg  for 4 weeks, then increase to 10mg  for 4 weeks, and then to 12.5mg . -Lantus increased to 40 units in the morning and 32 units in the evening. -Decrease Lantus dose by 2 units twice daily based on blood glucose readings if hypoglycemia occurs especially with up titration of Mounjaro -Counseled on Diabetic diet, my plate method, 161 minutes of moderate intensity exercise/week Blood sugar logs with fasting goals of 80-120 mg/dl, random of  less than 096 and in the event of sugars less than 60 mg/dl or greater than 045 mg/dl encouraged to notify the clinic. Advised on the need for annual eye exams, annual foot exams, Pneumonia vaccine.   Hypertension Blood pressure elevated at 157/83. Patient has been under significant stress due to family health issues. -Increase Amlodipine from 5mg  to 10mg  daily. -Continue Lisinopril/Hydrochlorothiazide. -Encourage low sodium diet. -Follow up with nurse in 2 weeks to reassess blood pressure.  -Will consider switching from metoprolol to Coreg if blood pressure is still above goal         Meds ordered this encounter  Medications   amLODipine (NORVASC) 10 MG tablet    Sig: Take 1 tablet (10 mg total) by mouth daily.    Dispense:  90 tablet    Refill:  1    Dose increase   lisinopril-hydrochlorothiazide (ZESTORETIC) 20-12.5 MG tablet    Sig: TAKE 2 TABLETS BY MOUTH ONCE DAILY.    Dispense:  180 tablet    Refill:  1   metFORMIN (GLUCOPHAGE) 1000 MG tablet    Sig: Take 1 tablet by mouth 2 (two) times daily with a meal.    Dispense:  180 tablet    Refill:  1   metoprolol succinate (TOPROL-XL) 100 MG 24 hr tablet    Sig: Take 1 tablet (100 mg total) by mouth daily. APPOINTMENT NEEDED FOR FUTURE REFILLS    Dispense:  90 tablet    Refill:  1   rosuvastatin (CRESTOR) 40 MG tablet    Sig: Take 1 tablet (40 mg total) by mouth daily.    Dispense:  90 tablet    Refill:  1   tirzepatide (MOUNJARO) 7.5 MG/0.5ML Pen    Sig: Inject 7.5 mg into the skin once a week. For 4 weeks then increase to 10 mg once a week    Dispense:  6 mL    Refill:  0    Discontinue Trulicity   tirzepatide (MOUNJARO) 10 MG/0.5ML Pen    Sig: Inject 10 mg into the skin once a week. For 4 weeks then increase to 12.5 mg once a week    Dispense:  6 mL    Refill:  0   insulin glargine-yfgn (SEMGLEE, YFGN,) 100 UNIT/ML Pen    Sig: Inject 40 Units into the skin in the morning AND 32 Units every evening.    Dispense:   30 mL    Refill:  3    Dose decrease   tirzepatide (MOUNJARO) 12.5 MG/0.5ML Pen    Sig: Inject 12.5 mg into the skin once a week.    Dispense:  6 mL    Refill:  6  dapagliflozin propanediol (FARXIGA) 10 MG TABS tablet    Sig: Take 1 tablet (10 mg total) by mouth daily before breakfast.    Dispense:  90 tablet    Refill:  1    Follow-up: Return in about 2 weeks (around 12/05/2022) for nurse visit, BP check, Medical conditions with PCP, in 3 months.       Hoy Register, MD, FAAFP. [facility] [place], [place] [phone]   11/21/2022, 5:50 PM

## 2022-11-21 NOTE — Patient Instructions (Signed)
Managing Your Hypertension Hypertension, also called high blood pressure, is when the force of the blood pressing against the walls of the arteries is too strong. Arteries are blood vessels that carry blood from your heart throughout your body. Hypertension forces the heart to work harder to pump blood and may cause the arteries to become narrow or stiff. Understanding blood pressure readings A blood pressure reading includes a higher number over a lower number: The first, or top, number is called the systolic pressure. It is a measure of the pressure in your arteries as your heart beats. The second, or bottom number, is called the diastolic pressure. It is a measure of the pressure in your arteries as the heart relaxes. For most people, a normal blood pressure is below 120/80. Your personal target blood pressure may vary depending on your medical conditions, your age, and other factors. Blood pressure is classified into four stages. Based on your blood pressure reading, your health care provider may use the following stages to determine what type of treatment you need, if any. Systolic pressure and diastolic pressure are measured in a unit called millimeters of mercury (mmHg). Normal Systolic pressure: below 120. Diastolic pressure: below 80. Elevated Systolic pressure: 120-129. Diastolic pressure: below 80. Hypertension stage 1 Systolic pressure: 130-139. Diastolic pressure: 80-89. Hypertension stage 2 Systolic pressure: 140 or above. Diastolic pressure: 90 or above. How can this condition affect me? Managing your hypertension is very important. Over time, hypertension can damage the arteries and decrease blood flow to parts of the body, including the brain, heart, and kidneys. Having untreated or uncontrolled hypertension can lead to: A heart attack. A stroke. A weakened blood vessel (aneurysm). Heart failure. Kidney damage. Eye damage. Memory and concentration problems. Vascular  dementia. What actions can I take to manage this condition? Hypertension can be managed by making lifestyle changes and possibly by taking medicines. Your health care provider will help you make a plan to bring your blood pressure within a normal range. You may be referred for counseling on a healthy diet and physical activity. Nutrition  Eat a diet that is high in fiber and potassium, and low in salt (sodium), added sugar, and fat. An example eating plan is called the DASH diet. DASH stands for Dietary Approaches to Stop Hypertension. To eat this way: Eat plenty of fresh fruits and vegetables. Try to fill one-half of your plate at each meal with fruits and vegetables. Eat whole grains, such as whole-wheat pasta, brown rice, or whole-grain bread. Fill about one-fourth of your plate with whole grains. Eat low-fat dairy products. Avoid fatty cuts of meat, processed or cured meats, and poultry with skin. Fill about one-fourth of your plate with lean proteins such as fish, chicken without skin, beans, eggs, and tofu. Avoid pre-made and processed foods. These tend to be higher in sodium, added sugar, and fat. Reduce your daily sodium intake. Many people with hypertension should eat less than 1,500 mg of sodium a day. Lifestyle  Work with your health care provider to maintain a healthy body weight or to lose weight. Ask what an ideal weight is for you. Get at least 30 minutes of exercise that causes your heart to beat faster (aerobic exercise) most days of the week. Activities may include walking, swimming, or biking. Include exercise to strengthen your muscles (resistance exercise), such as weight lifting, as part of your weekly exercise routine. Try to do these types of exercises for 30 minutes at least 3 days a week. Do  not use any products that contain nicotine or tobacco. These products include cigarettes, chewing tobacco, and vaping devices, such as e-cigarettes. If you need help quitting, ask your  health care provider. Control any long-term (chronic) conditions you have, such as high cholesterol or diabetes. Identify your sources of stress and find ways to manage stress. This may include meditation, deep breathing, or making time for fun activities. Alcohol use Do not drink alcohol if: Your health care provider tells you not to drink. You are pregnant, may be pregnant, or are planning to become pregnant. If you drink alcohol: Limit how much you have to: 0-1 drink a day for women. 0-2 drinks a day for men. Know how much alcohol is in your drink. In the [place] one drink equals one 12 oz bottle of beer (355 mL), one 5 oz glass of wine (148 mL), or one 1 oz glass of hard liquor (44 mL). Medicines Your health care provider may prescribe medicine if lifestyle changes are not enough to get your blood pressure under control and if: Your systolic blood pressure is 130 or higher. Your diastolic blood pressure is 80 or higher. Take medicines only as told by your health care provider. Follow the directions carefully. Blood pressure medicines must be taken as told by your health care provider. The medicine does not work as well when you skip doses. Skipping doses also puts you at risk for problems. Monitoring Before you monitor your blood pressure: Do not smoke, drink caffeinated beverages, or exercise within 30 minutes before taking a measurement. Use the bathroom and empty your bladder (urinate). Sit quietly for at least 5 minutes before taking measurements. Monitor your blood pressure at home as told by your health care provider. To do this: Sit with your back straight and supported. Place your feet flat on the floor. Do not cross your legs. Support your arm on a flat surface, such as a table. Make sure your upper arm is at heart level. Each time you measure, take two or three readings one minute apart and record the results. You may also need to have your blood pressure checked regularly by  your health care provider. General information Talk with your health care provider about your diet, exercise habits, and other lifestyle factors that may be contributing to hypertension. Review all the medicines you take with your health care provider because there may be side effects or interactions. Keep all follow-up visits. Your health care provider can help you create and adjust your plan for managing your high blood pressure. Where to find more information National Heart, Lung, and Blood Institute: PopSteam.is American Heart Association: www.heart.org Contact a health care provider if: You think you are having a reaction to medicines you have taken. You have repeated (recurrent) headaches. You feel dizzy. You have swelling in your ankles. You have trouble with your vision. Get help right away if: You develop a severe headache or confusion. You have unusual weakness or numbness, or you feel faint. You have severe pain in your chest or abdomen. You vomit repeatedly. You have trouble breathing. These symptoms may be an emergency. Get help right away. Call 911. Do not wait to see if the symptoms will go away. Do not drive yourself to the hospital. Summary Hypertension is when the force of blood pumping through your arteries is too strong. If this condition is not controlled, it may put you at risk for serious complications. Your personal target blood pressure may vary depending on your medical conditions,  your age, and other factors. For most people, a normal blood pressure is less than 120/80. Hypertension is managed by lifestyle changes, medicines, or both. Lifestyle changes to help manage hypertension include losing weight, eating a healthy, low-sodium diet, exercising more, stopping smoking, and limiting alcohol. This information is not intended to replace advice given to you by your health care provider. Make sure you discuss any questions you have with your health care  provider. Document Revised: 11/05/2020 Document Reviewed: 11/05/2020 Elsevier Patient Education  2024 [employer].

## 2022-11-22 LAB — CMP14+EGFR
ALT: 12 IU/L (ref 0–32)
AST: 10 IU/L (ref 0–40)
Albumin: 4.3 g/dL (ref 3.9–4.9)
Alkaline Phosphatase: 100 IU/L (ref 44–121)
BUN/Creatinine Ratio: 12 (ref 9–23)
BUN: 10 mg/dL (ref 6–24)
Bilirubin Total: 0.3 mg/dL (ref 0.0–1.2)
CO2: 21 mmol/L (ref 20–29)
Calcium: 9.6 mg/dL (ref 8.7–10.2)
Chloride: 104 mmol/L (ref 96–106)
Creatinine, Ser: 0.83 mg/dL (ref 0.57–1.00)
Globulin, Total: 3 g/dL (ref 1.5–4.5)
Glucose: 95 mg/dL (ref 70–99)
Potassium: 4 mmol/L (ref 3.5–5.2)
Sodium: 142 mmol/L (ref 134–144)
Total Protein: 7.3 g/dL (ref 6.0–8.5)
eGFR: 88 mL/min/{1.73_m2} (ref >59)

## 2022-11-25 ENCOUNTER — Other Ambulatory Visit: Payer: Self-pay

## 2022-12-05 ENCOUNTER — Ambulatory Visit: Payer: Managed Care, Other (non HMO)

## 2022-12-09 ENCOUNTER — Ambulatory Visit: Payer: Managed Care, Other (non HMO)

## 2023-01-31 ENCOUNTER — Other Ambulatory Visit: Payer: Self-pay | Admitting: Family Medicine

## 2023-01-31 ENCOUNTER — Other Ambulatory Visit: Payer: Self-pay

## 2023-01-31 DIAGNOSIS — K5909 Other constipation: Secondary | ICD-10-CM

## 2023-01-31 MED ORDER — LINACLOTIDE 72 MCG PO CAPS
72.0000 ug | ORAL_CAPSULE | Freq: Every day | ORAL | 0 refills | Status: DC
Start: 2023-01-31 — End: 2023-05-08
  Filled 2023-01-31: qty 30, 30d supply, fill #0

## 2023-01-31 NOTE — Telephone Encounter (Signed)
Requested Prescriptions  Pending Prescriptions Disp Refills   linaclotide (LINZESS) 72 MCG capsule 90 capsule 0    Sig: Take 1 capsule (72 mcg total) by mouth daily before breakfast.     Gastroenterology: Irritable Bowel Syndrome Passed - 01/31/2023  8:25 AM      Passed - Valid encounter within last 12 months    Recent Outpatient Visits           2 months ago Type 2 diabetes mellitus with diabetic polyneuropathy, with long-term current use of insulin (HCC)   [facility] [place] [place] - [facility] Dept Of [place]. [facility] Hoy Register, MD   9 months ago Annual visit for general adult medical examination with abnormal findings   [facility] - [facility] Dept Of [place]. [facility] Hoy Register, MD   10 months ago Type 2 diabetes mellitus with diabetic polyneuropathy, with long-term current use of insulin (HCC)   [facility] [place] - A Dept Of [place]. [facility] Hoy Register, MD   1 year ago Type 2 diabetes mellitus with diabetic polyneuropathy, with long-term current use of insulin (HCC)   [facility] [place] - A Dept Of [place]. [facility] Hoy Register, MD   1 year ago Type 2 diabetes mellitus with diabetic polyneuropathy, with long-term current use of insulin (HCC)   [facility] [place] - A Dept Of [place]. [facility] Hoy Register, MD       Future Appointments             In 3 weeks Hoy Register, MD [facility] [place] - A Dept Of [place]. [facility]

## 2023-02-09 ENCOUNTER — Other Ambulatory Visit: Payer: Self-pay

## 2023-02-22 ENCOUNTER — Ambulatory Visit: Payer: Managed Care, Other (non HMO) | Admitting: Family Medicine

## 2023-03-27 ENCOUNTER — Other Ambulatory Visit (HOSPITAL_COMMUNITY): Payer: Self-pay

## 2023-05-08 ENCOUNTER — Encounter: Payer: Self-pay | Admitting: Family Medicine

## 2023-05-08 ENCOUNTER — Other Ambulatory Visit: Payer: Self-pay

## 2023-05-08 ENCOUNTER — Telehealth (HOSPITAL_BASED_OUTPATIENT_CLINIC_OR_DEPARTMENT_OTHER): Payer: Managed Care, Other (non HMO) | Admitting: Family Medicine

## 2023-05-08 DIAGNOSIS — K5900 Constipation, unspecified: Secondary | ICD-10-CM

## 2023-05-08 DIAGNOSIS — F419 Anxiety disorder, unspecified: Secondary | ICD-10-CM

## 2023-05-08 DIAGNOSIS — I10 Essential (primary) hypertension: Secondary | ICD-10-CM

## 2023-05-08 DIAGNOSIS — Z7985 Long-term (current) use of injectable non-insulin antidiabetic drugs: Secondary | ICD-10-CM

## 2023-05-08 DIAGNOSIS — K5909 Other constipation: Secondary | ICD-10-CM

## 2023-05-08 DIAGNOSIS — K529 Noninfective gastroenteritis and colitis, unspecified: Secondary | ICD-10-CM

## 2023-05-08 DIAGNOSIS — E119 Type 2 diabetes mellitus without complications: Secondary | ICD-10-CM | POA: Diagnosis not present

## 2023-05-08 DIAGNOSIS — F32A Depression, unspecified: Secondary | ICD-10-CM

## 2023-05-08 DIAGNOSIS — E1159 Type 2 diabetes mellitus with other circulatory complications: Secondary | ICD-10-CM

## 2023-05-08 DIAGNOSIS — Z7984 Long term (current) use of oral hypoglycemic drugs: Secondary | ICD-10-CM

## 2023-05-08 DIAGNOSIS — Z794 Long term (current) use of insulin: Secondary | ICD-10-CM

## 2023-05-08 MED ORDER — DULOXETINE HCL 60 MG PO CPEP
60.0000 mg | ORAL_CAPSULE | Freq: Every day | ORAL | 3 refills | Status: AC
  Filled 2023-05-08: qty 30, 30d supply, fill #0

## 2023-05-08 MED ORDER — LISINOPRIL-HYDROCHLOROTHIAZIDE 20-12.5 MG PO TABS
2.0000 | ORAL_TABLET | Freq: Every day | ORAL | 1 refills | Status: AC
  Filled 2023-09-11: qty 60, 30d supply, fill #0
  Filled 2023-10-05: qty 60, 30d supply, fill #1
  Filled 2024-03-13: qty 180, 90d supply, fill #2

## 2023-05-08 MED ORDER — ROSUVASTATIN CALCIUM 40 MG PO TABS
40.0000 mg | ORAL_TABLET | Freq: Every day | ORAL | 1 refills | Status: DC
Start: 2023-05-08 — End: 2023-05-18

## 2023-05-08 MED ORDER — DAPAGLIFLOZIN PROPANEDIOL 10 MG PO TABS
10.0000 mg | ORAL_TABLET | Freq: Every day | ORAL | 1 refills | Status: AC
  Filled 2024-03-13: qty 90, 90d supply, fill #0

## 2023-05-08 MED ORDER — GABAPENTIN 300 MG PO CAPS
600.0000 mg | ORAL_CAPSULE | Freq: Three times a day (TID) | ORAL | 6 refills | Status: AC
  Filled 2023-05-08: qty 180, 30d supply, fill #0
  Filled 2024-03-13: qty 180, 30d supply, fill #1

## 2023-05-08 MED ORDER — INSULIN GLARGINE-YFGN 100 UNIT/ML ~~LOC~~ SOPN
PEN_INJECTOR | SUBCUTANEOUS | 3 refills | Status: DC
  Filled 2023-05-08: qty 18, 25d supply, fill #0

## 2023-05-08 MED ORDER — TIRZEPATIDE 12.5 MG/0.5ML ~~LOC~~ SOAJ
12.5000 mg | SUBCUTANEOUS | 6 refills | Status: AC
  Filled 2023-05-08: qty 2, 28d supply, fill #0
  Filled 2023-06-14: qty 2, 28d supply, fill #1
  Filled 2023-07-06 – 2023-07-11 (×2): qty 2, 28d supply, fill #2
  Filled 2023-07-11: qty 2, 28d supply, fill #0
  Filled 2023-08-08: qty 2, 28d supply, fill #1
  Filled 2023-09-11: qty 2, 28d supply, fill #2
  Filled 2023-10-11: qty 2, 28d supply, fill #3
  Filled 2023-11-13: qty 2, 28d supply, fill #4
  Filled 2023-12-20 – 2023-12-25 (×2): qty 2, 28d supply, fill #5
  Filled 2024-02-07 – 2024-02-13 (×2): qty 2, 28d supply, fill #6
  Filled 2024-03-13: qty 2, 28d supply, fill #7
  Filled 2024-04-02 – 2024-04-06 (×2): qty 2, 28d supply, fill #8

## 2023-05-08 MED ORDER — METFORMIN HCL 1000 MG PO TABS
1000.0000 mg | ORAL_TABLET | Freq: Two times a day (BID) | ORAL | 1 refills | Status: AC
  Filled 2024-03-13: qty 180, 90d supply, fill #0

## 2023-05-08 MED ORDER — LINACLOTIDE 72 MCG PO CAPS
72.0000 ug | ORAL_CAPSULE | Freq: Every day | ORAL | 1 refills | Status: AC
  Filled 2023-05-08: qty 30, 30d supply, fill #0

## 2023-05-08 MED ORDER — AMLODIPINE BESYLATE 10 MG PO TABS
10.0000 mg | ORAL_TABLET | Freq: Every day | ORAL | 1 refills | Status: AC
  Filled 2024-03-13: qty 90, 90d supply, fill #0

## 2023-05-08 MED ORDER — METOPROLOL SUCCINATE ER 100 MG PO TB24
100.0000 mg | ORAL_TABLET | Freq: Every day | ORAL | 1 refills | Status: DC
Start: 2023-05-08 — End: 2023-08-17

## 2023-05-08 NOTE — Progress Notes (Signed)
 Virtual Visit via Video Note  I connected with Mariah Miller, on 05/08/2023 at 3:38 PM by video enabled telemedicine device and verified that I am speaking with the correct person using two identifiers.   Consent: I discussed the limitations, risks, security and privacy concerns of performing an evaluation and management service by telemedicine and the availability of in person appointments. I also discussed with the patient that there may be a patient responsible charge related to this service. The patient expressed understanding and agreed to proceed.   Location of Patient: Home  Location of Provider: Clinic   Persons participating in Telemedicine visit: Mariah Miller Dr. Alvis Lemmings     Discussed the use of AI scribe software for clinical note transcription with the patient, who gave verbal consent to proceed.  History of Present Illness The patient, with a history of diabetes, hypertension, anxiety and depression presents with a recent stomach virus. She reports feeling weak and has been maintaining hydration and eating chicken noodle soup to keep food down. She has been able to continue taking her medications.  Since starting a new insulin, Mounjaro 12.5mg , her blood sugars have improved significantly. She reports weight loss and her blood sugars range from 70 to 189. She rarely needs Humalog and continues to take Lantus 40 units in the morning and 32 units at night. She also takes [place] and amlodipine for blood pressure.  The patient has also been dealing with family health issues. Her son and mother were both recently diagnosed with diabetes. She has been helping her mother adjust to insulin therapy and planning meals with her son.  She had an eye exam in January and reports no damage from diabetes, but a slight touch of cataract. She has new glasses.   Past Medical History:  Diagnosis Date   Depression    Diabetes mellitus without complication (HCC)     Hyperlipidemia    Hypertension    Migraine    Seizures (HCC)    self reported - last seizure was Oct 2019 "absent seizure"   Allergies  Allergen Reactions   Tomato Itching and Rash    Current Outpatient Medications on File Prior to Visit  Medication Sig Dispense Refill   albuterol (VENTOLIN HFA) 108 (90 Base) MCG/ACT inhaler Inhale 2 puffs into the lungs every 6 (six) hours as needed for wheezing or shortness of breath. 8 g 0   amLODipine (NORVASC) 10 MG tablet Take 1 tablet (10 mg total) by mouth daily. 90 tablet 1   benzonatate (TESSALON) 100 MG capsule Take 1 capsule (100 mg total) by mouth 3 (three) times daily as needed for cough. 30 capsule 0   dapagliflozin propanediol (FARXIGA) 10 MG TABS tablet Take 1 tablet (10 mg total) by mouth daily before breakfast. 90 tablet 1   DULoxetine (CYMBALTA) 60 MG capsule Take 1 capsule (60 mg total) by mouth daily. 30 capsule 3   ergocalciferol (DRISDOL) 1.25 MG (50000 UT) capsule Take 1 capsule (50,000 Units total) by mouth once a week. 12 capsule 1   gabapentin (NEURONTIN) 300 MG capsule Take 2 capsules (600 mg total) by mouth 3 (three) times daily. 180 capsule 2   glucose blood (TRUE METRIX BLOOD GLUCOSE TEST) test strip Use as instructed (Patient taking differently: 1 each by Other route as directed.) 100 each 12   insulin glargine-yfgn (SEMGLEE, YFGN,) 100 UNIT/ML Pen Inject 40 Units into the skin in the morning AND 32 Units every evening. 30 mL 3   insulin lispro (  HUMALOG) 100 UNIT/ML injection Inject 0.01 mLs (1 Units total) into the skin as needed for high blood sugar. Sliding scale needed. 10 mL 3   insulin lispro (HUMALOG) 100 UNIT/ML injection Inject 0-0.12 mLs (0-12 Units total) into the skin 3 (three) times daily with meals. As per sliding scale 10 mL 3   linaclotide (LINZESS) 72 MCG capsule Take 1 capsule (72 mcg total) by mouth daily before breakfast. 90 capsule 0   lisinopril-hydrochlorothiazide (ZESTORETIC) 20-12.5 MG tablet TAKE 2  TABLETS BY MOUTH ONCE DAILY. 180 tablet 1   meloxicam (MOBIC) 15 MG tablet Take 1 tablet (15 mg total) by mouth daily. 30 tablet 0   metFORMIN (GLUCOPHAGE) 1000 MG tablet Take 1 tablet by mouth 2 (two) times daily with a meal. 180 tablet 1   metoprolol succinate (TOPROL-XL) 100 MG 24 hr tablet Take 1 tablet (100 mg total) by mouth daily. APPOINTMENT NEEDED FOR FUTURE REFILLS 90 tablet 1   rosuvastatin (CRESTOR) 40 MG tablet Take 1 tablet (40 mg total) by mouth daily. 90 tablet 1   tirzepatide (MOUNJARO) 10 MG/0.5ML Pen Inject 10 mg into the skin once a week. For 4 weeks then increase to 12.5 mg once a week 6 mL 0   tirzepatide (MOUNJARO) 12.5 MG/0.5ML Pen Inject 12.5 mg into the skin once a week. 6 mL 6   tirzepatide (MOUNJARO) 7.5 MG/0.5ML Pen Inject 7.5 mg into the skin once a week. For 4 weeks then increase to 10 mg once a week 6 mL 0   TRUEplus Lancets 28G MISC USE AS INSTRUCTED (Patient taking differently: 1 each by Other route as directed.) 100 each 5   No current facility-administered medications on file prior to visit.    ROS: See HPI  Observations/Objective: Awake, alert, oriented x3 Not in acute distress Normal mood      Latest Ref Rng & Units 11/21/2022    2:56 PM 04/06/2022    8:43 AM 09/20/2021   11:47 AM  CMP  Glucose 70 - 99 mg/dL 95  409  95   BUN 6 - 24 mg/dL 10  6  8    Creatinine 0.57 - 1.00 mg/dL 8.11  9.14  7.82   Sodium 134 - 144 mmol/L 142  140  139   Potassium 3.5 - 5.2 mmol/L 4.0  4.8  4.5   Chloride 96 - 106 mmol/L 104  105  106   CO2 20 - 29 mmol/L 21  21  21    Calcium 8.7 - 10.2 mg/dL 9.6  9.4  9.1   Total Protein 6.0 - 8.5 g/dL 7.3  6.9  6.6   Total Bilirubin 0.0 - 1.2 mg/dL 0.3  0.2  0.2   Alkaline Phos 44 - 121 IU/L 100  81  85   AST 0 - 40 IU/L 10  12  19    ALT 0 - 32 IU/L 12  8  16      Lipid Panel     Component Value Date/Time   CHOL 181 04/06/2022 0843   TRIG 70 04/06/2022 0843   HDL 45 04/06/2022 0843   CHOLHDL 4.3 10/28/2020 0956    CHOLHDL 4.7 05/25/2016 1543   VLDL 37 (H) 05/25/2016 1543   LDLCALC 123 (H) 04/06/2022 0843   LABVLDL 13 04/06/2022 0843    Lab Results  Component Value Date   HGBA1C 11.0 (A) 11/21/2022      Assessment & Plan Gastroenteritis Weakness and difficulty keeping food down due to a stomach virus. Managed symptoms with  hydration and light meals. - Advise to maintain hydration with fluids. - Encourage consumption of light meals as tolerated.  Type 2 Diabetes Mellitus Previously uncontrolled with A1c of 11.0 Blood glucose levels improved with Mounjaro 12.5 mg, resulting in weight loss and reduced Humalog use. Current glucose readings range from 70 to 189 mg/dL. She manages her diet well. - Schedule A1c test for May 17, 2023. - Refill Mounjaro, Lantus, Humalog, and Farxiga prescriptions. - Advise to continue current dietary habits and monitor blood glucose levels.  Hypertension Managed with amlodipine. - Refill amlodipine prescription. -Counseled on blood pressure goal of less than 130/80, low-sodium, DASH diet, medication compliance, 150 minutes of moderate intensity exercise per week. Discussed medication compliance, adverse effects.   Anxiety and depression -Controlled on Cymbalta   Constipation -Controlled on Linzess -Counseled on increasing fiber intake, fruits and vegetable, limit intake of foods like cheese, white bread, white rice  Cataracts Slight cataract noted in January eye exam with no diabetic retinopathy. - Request records from the eye doctor at [facility].    Follow Up Instructions: 3 months    I discussed the assessment and treatment plan with the patient. The patient was provided an opportunity to ask questions and all were answered. The patient agreed with the plan and demonstrated an understanding of the instructions.   The patient was advised to call back or seek an in-person evaluation if the symptoms worsen or if the condition fails to  improve as anticipated.     I provided 17 minutes total of Telehealth time during this encounter including median intraservice time, reviewing previous notes, investigations, ordering medications, medical decision making, coordinating care and patient verbalized understanding at the end of the visit.     Hoy Register, MD, FAAFP. [facility] [place], [place] [phone]   05/08/2023, 3:38 PM

## 2023-05-08 NOTE — Patient Instructions (Signed)
 VISIT SUMMARY:  During today's visit, we discussed your recent stomach virus, your diabetes management, and other health concerns. You have been feeling weak but are managing to stay hydrated and eat light meals. Your blood sugar levels have improved significantly with the new insulin, and you are experiencing weight loss. We also reviewed your hypertension and eye health.  YOUR PLAN:  -GASTROENTERITIS: Gastroenteritis is an inflammation of the stomach and intestines, often caused by a virus, leading to symptoms like weakness and difficulty keeping food down. Continue to stay hydrated with fluids and eat light meals as you can tolerate.  -TYPE 2 DIABETES MELLITUS: Type 2 Diabetes Mellitus is a condition where your body does not use insulin properly, leading to high blood sugar levels. Your blood sugar levels have improved with Mounjaro 12.5 mg, and you have experienced weight loss. Continue your current medications and dietary habits, and monitor your blood glucose levels. We have scheduled an A1c test for May 17, 2023, and refilled your prescriptions for Greggory Keen, Lantus, Humalog, and [place].  -HYPERTENSION: Hypertension, or high blood pressure, is being managed with amlodipine. We have refilled your prescription for amlodipine.  -CATARACTS: Cataracts are a clouding of the eye's lens, which can affect vision. A slight cataract was noted in your January eye exam, but there was no damage from diabetes. We will request records from your eye doctor at [facility].  INSTRUCTIONS:  Please follow up with the scheduled A1c test on May 17, 2023. Continue monitoring your blood glucose levels and maintain your current dietary habits. Ensure you stay hydrated and eat light meals as you recover from the stomach virus. We will also request records from your eye doctor to keep track of your cataract.

## 2023-05-09 ENCOUNTER — Other Ambulatory Visit: Payer: Self-pay

## 2023-05-10 ENCOUNTER — Other Ambulatory Visit: Payer: Self-pay

## 2023-05-11 ENCOUNTER — Other Ambulatory Visit: Payer: Self-pay

## 2023-05-15 ENCOUNTER — Other Ambulatory Visit: Payer: Self-pay

## 2023-05-17 ENCOUNTER — Encounter: Payer: Self-pay | Admitting: Family Medicine

## 2023-05-17 ENCOUNTER — Ambulatory Visit: Attending: Family Medicine

## 2023-05-17 DIAGNOSIS — E1142 Type 2 diabetes mellitus with diabetic polyneuropathy: Secondary | ICD-10-CM

## 2023-05-18 ENCOUNTER — Other Ambulatory Visit: Payer: Self-pay | Admitting: Family Medicine

## 2023-05-18 ENCOUNTER — Encounter: Payer: Self-pay | Admitting: Family Medicine

## 2023-05-18 ENCOUNTER — Other Ambulatory Visit: Payer: Self-pay

## 2023-05-18 DIAGNOSIS — E1169 Type 2 diabetes mellitus with other specified complication: Secondary | ICD-10-CM

## 2023-05-18 DIAGNOSIS — E1142 Type 2 diabetes mellitus with diabetic polyneuropathy: Secondary | ICD-10-CM

## 2023-05-18 LAB — LP+NON-HDL CHOLESTEROL
Cholesterol, Total: 197 mg/dL (ref 100–199)
HDL: 39 mg/dL — ABNORMAL LOW (ref >39)
LDL Chol Calc (NIH): 138 mg/dL — ABNORMAL HIGH (ref 0–99)
Total Non-HDL-Chol (LDL+VLDL): 158 mg/dL — ABNORMAL HIGH (ref 0–129)
Triglycerides: 109 mg/dL (ref 0–149)
VLDL Cholesterol Cal: 20 mg/dL (ref 5–40)

## 2023-05-18 LAB — CMP14+EGFR
ALT: 7 IU/L (ref 0–32)
AST: 15 IU/L (ref 0–40)
Albumin: 4.1 g/dL (ref 3.9–4.9)
Alkaline Phosphatase: 81 IU/L (ref 44–121)
BUN/Creatinine Ratio: 9 (ref 9–23)
BUN: 8 mg/dL (ref 6–24)
Bilirubin Total: 0.5 mg/dL (ref 0.0–1.2)
CO2: 20 mmol/L (ref 20–29)
Calcium: 9 mg/dL (ref 8.7–10.2)
Chloride: 104 mmol/L (ref 96–106)
Creatinine, Ser: 0.94 mg/dL (ref 0.57–1.00)
Globulin, Total: 3.1 g/dL (ref 1.5–4.5)
Glucose: 89 mg/dL (ref 70–99)
Potassium: 4.3 mmol/L (ref 3.5–5.2)
Sodium: 138 mmol/L (ref 134–144)
Total Protein: 7.2 g/dL (ref 6.0–8.5)
eGFR: 76 mL/min/{1.73_m2} (ref >59)

## 2023-05-18 LAB — HEMOGLOBIN A1C
Est. average glucose Bld gHb Est-mCnc: 134 mg/dL
Hgb A1c MFr Bld: 6.3 % — ABNORMAL HIGH (ref 4.8–5.6)

## 2023-05-18 MED ORDER — EZETIMIBE 10 MG PO TABS
10.0000 mg | ORAL_TABLET | Freq: Every day | ORAL | 3 refills | Status: AC
  Filled 2023-05-18: qty 30, 30d supply, fill #0
  Filled 2024-03-13: qty 90, 90d supply, fill #0

## 2023-05-18 MED ORDER — ROSUVASTATIN CALCIUM 40 MG PO TABS: 37.0000 mg | ORAL_TABLET | Freq: Every day | ORAL | Status: AC

## 2023-05-22 ENCOUNTER — Other Ambulatory Visit: Payer: Self-pay

## 2023-05-22 ENCOUNTER — Telehealth (HOSPITAL_BASED_OUTPATIENT_CLINIC_OR_DEPARTMENT_OTHER): Admitting: Family Medicine

## 2023-05-22 ENCOUNTER — Encounter: Payer: Self-pay | Admitting: Family Medicine

## 2023-05-22 ENCOUNTER — Telehealth: Payer: Self-pay

## 2023-05-22 DIAGNOSIS — E1142 Type 2 diabetes mellitus with diabetic polyneuropathy: Secondary | ICD-10-CM | POA: Diagnosis not present

## 2023-05-22 DIAGNOSIS — Z029 Encounter for administrative examinations, unspecified: Secondary | ICD-10-CM | POA: Diagnosis not present

## 2023-05-22 DIAGNOSIS — E11649 Type 2 diabetes mellitus with hypoglycemia without coma: Secondary | ICD-10-CM | POA: Diagnosis not present

## 2023-05-22 DIAGNOSIS — Z794 Long term (current) use of insulin: Secondary | ICD-10-CM

## 2023-05-22 MED ORDER — INSULIN GLARGINE-YFGN 100 UNIT/ML ~~LOC~~ SOPN
PEN_INJECTOR | SUBCUTANEOUS | 3 refills | Status: DC
  Filled 2023-05-22: qty 30, 46d supply, fill #0

## 2023-05-22 NOTE — Progress Notes (Signed)
 Virtual Visit via Video Note  I connected with Mariah Miller, on 05/22/2023 at 8:25 AM by video enabled telemedicine device and verified that I am speaking with the correct person using two identifiers.   Consent: I discussed the limitations, risks, security and privacy concerns of performing an evaluation and management service by telemedicine and the availability of in person appointments. I also discussed with the patient that there may be a patient responsible charge related to this service. The patient expressed understanding and agreed to proceed.   Location of Patient: Home  Location of Provider: Clinic   Persons participating in Telemedicine visit: Mariah Miller Dr. Alvis Lemmings    Discussed the use of AI scribe software for clinical note transcription with the patient, who gave verbal consent to proceed.  History of Present Illness The patient, with a 20-year history of 2 diabetes mellitus, hypertension, anxiety and depression, presents with recurrent hypoglycemia. She reports that her blood sugar levels have been dropping frequently, even to as low as 43, despite eating regularly. This has resulted in her being sent home from work due to low blood sugar levels. The patient works night shifts and is constantly moving around at her job, which she believes contributes to her blood sugar dropping.  Last A1c was 6.3 this month and her Lantus was decreased from 40 units to 37 units in the morning after this A1c and she has been remaining on 32 units of Lantus at night in addition to [place]. She is needing FMLA paperwork completed to cover her in the event of missing work or attending doctors appointments      Past Medical History:  Diagnosis Date   Depression    Diabetes mellitus without complication (HCC)    Hyperlipidemia    Hypertension    Migraine    Seizures (HCC)    self reported - last seizure was Oct 2019 "absent seizure"   Allergies  Allergen  Reactions   Tomato Itching and Rash    Current Outpatient Medications on File Prior to Visit  Medication Sig Dispense Refill   albuterol (VENTOLIN HFA) 108 (90 Base) MCG/ACT inhaler Inhale 2 puffs into the lungs every 6 (six) hours as needed for wheezing or shortness of breath. 8 g 0   amLODipine (NORVASC) 10 MG tablet Take 1 tablet (10 mg total) by mouth daily. 90 tablet 1   benzonatate (TESSALON) 100 MG capsule Take 1 capsule (100 mg total) by mouth 3 (three) times daily as needed for cough. 30 capsule 0   dapagliflozin propanediol (FARXIGA) 10 MG TABS tablet Take 1 tablet (10 mg total) by mouth daily before breakfast. 90 tablet 1   DULoxetine (CYMBALTA) 60 MG capsule Take 1 capsule (60 mg total) by mouth daily. 30 capsule 3   ergocalciferol (DRISDOL) 1.25 MG (50000 UT) capsule Take 1 capsule (50,000 Units total) by mouth once a week. 12 capsule 1   ezetimibe (ZETIA) 10 MG tablet Take 1 tablet (10 mg total) by mouth daily. 90 tablet 3   gabapentin (NEURONTIN) 300 MG capsule Take 2 capsules (600 mg total) by mouth 3 (three) times daily. 180 capsule 6   glucose blood (TRUE METRIX BLOOD GLUCOSE TEST) test strip Use as instructed (Patient taking differently: 1 each by Other route as directed.) 100 each 12   insulin lispro (HUMALOG) 100 UNIT/ML injection Inject 0.01 mLs (1 Units total) into the skin as needed for high blood sugar. Sliding scale needed. 10 mL 3   linaclotide (LINZESS)  72 MCG capsule Take 1 capsule (72 mcg total) by mouth daily before breakfast. 90 capsule 1   lisinopril-hydrochlorothiazide (ZESTORETIC) 20-12.5 MG tablet TAKE 2 TABLETS BY MOUTH ONCE DAILY. 180 tablet 1   meloxicam (MOBIC) 15 MG tablet Take 1 tablet (15 mg total) by mouth daily. 30 tablet 0   metFORMIN (GLUCOPHAGE) 1000 MG tablet Take 1 tablet by mouth 2 (two) times daily with a meal. 180 tablet 1   metoprolol succinate (TOPROL-XL) 100 MG 24 hr tablet Take 1 tablet (100 mg total) by mouth daily. 90 tablet 1    rosuvastatin (CRESTOR) 40 MG tablet Take 1 tablet (40 mg total) by mouth daily.     tirzepatide (MOUNJARO) 12.5 MG/0.5ML Pen Inject 12.5 mg into the skin once a week. 6 mL 6   TRUEplus Lancets 28G MISC USE AS INSTRUCTED (Patient taking differently: 1 each by Other route as directed.) 100 each 5   No current facility-administered medications on file prior to visit.    ROS: See HPI  Observations/Objective: Awake, alert, oriented x3 Not in acute distress Normal mood      Latest Ref Rng & Units 05/17/2023    9:20 AM 11/21/2022    2:56 PM 04/06/2022    8:43 AM  CMP  Glucose 70 - 99 mg/dL 89  95  102   BUN 6 - 24 mg/dL 8  10  6    Creatinine 0.57 - 1.00 mg/dL 7.25  3.66  4.40   Sodium 134 - 144 mmol/L 138  142  140   Potassium 3.5 - 5.2 mmol/L 4.3  4.0  4.8   Chloride 96 - 106 mmol/L 104  104  105   CO2 20 - 29 mmol/L 20  21  21    Calcium 8.7 - 10.2 mg/dL 9.0  9.6  9.4   Total Protein 6.0 - 8.5 g/dL 7.2  7.3  6.9   Total Bilirubin 0.0 - 1.2 mg/dL 0.5  0.3  0.2   Alkaline Phos 44 - 121 IU/L 81  100  81   AST 0 - 40 IU/L 15  10  12    ALT 0 - 32 IU/L 7  12  8      Lipid Panel     Component Value Date/Time   CHOL 197 05/17/2023 0920   TRIG 109 05/17/2023 0920   HDL 39 (L) 05/17/2023 0920   CHOLHDL 4.3 10/28/2020 0956   CHOLHDL 4.7 05/25/2016 1543   VLDL 37 (H) 05/25/2016 1543   LDLCALC 138 (H) 05/17/2023 0920   LABVLDL 20 05/17/2023 0920    Lab Results  Component Value Date   HGBA1C 6.3 (H) 05/17/2023      Assessment & Plan Hypoglycemia Recurrent hypoglycemia during night shifts due to insulin regimen and also increased activity at work. A1c at 6.3% indicates good control, but regimen adjustments needed. - Reduce nighttime insulin glargine to 28 units down from 32 units. - Maintain morning insulin glargine at 37 units. - Further reduce nighttime dose to 26 units if hypoglycemia persists during night shifts. - Reduce morning dose by 2 units if hypoglycemia occurs during  the day. - Adjust insulin based on glucose readings and activity levels.  Type 2 Diabetes Mellitus Long-standing Type 2 Diabetes Mellitus with good control (A1c 6.3%). Insulin regimen adjustments needed to prevent hypoglycemia. - Continue current diabetes management with insulin adjustments as discussed.  Employment Documentation Requires employer documentation for diabetes management and potential leave due to hypoglycemia. - Complete and fax employer form regarding diabetes  management and leave requirements. - Document intermittent leave up to three times per month, each episode lasting approximately eight hours. - Schedule follow-up appointments every three months for diabetes management.      Meds ordered this encounter  Medications   insulin glargine-yfgn (SEMGLEE, YFGN,) 100 UNIT/ML Pen    Sig: Inject 37 Units into the skin in the morning AND 28 Units every evening.    Dispense:  30 mL    Refill:  3    Dose decrease    Follow Up Instructions: 3 months.   I discussed the assessment and treatment plan with the patient. The patient was provided an opportunity to ask questions and all were answered. The patient agreed with the plan and demonstrated an understanding of the instructions.   The patient was advised to call back or seek an in-person evaluation if the symptoms worsen or if the condition fails to improve as anticipated.     I provided 15 minutes total of Telehealth time during this encounter including median intraservice time, reviewing previous notes, investigations, ordering medications, medical decision making, coordinating care and patient verbalized understanding at the end of the visit.     Hoy Register, MD, FAAFP. [facility] [place], [place] [phone]   05/22/2023, 8:25 AM

## 2023-05-22 NOTE — Telephone Encounter (Signed)
 LVM for patient informing her that paperwork is ready for pick up.

## 2023-05-29 ENCOUNTER — Other Ambulatory Visit: Payer: Self-pay

## 2023-06-20 ENCOUNTER — Ambulatory Visit: Payer: Self-pay

## 2023-06-20 ENCOUNTER — Encounter: Payer: Self-pay | Admitting: Family Medicine

## 2023-06-20 NOTE — Telephone Encounter (Signed)
 Tried reaching out to patient to see if she is able to send pictures through MyChart was unable to reach pt. Will forward to provider for recommendations

## 2023-06-20 NOTE — Telephone Encounter (Addendum)
 3rd call attempt went unanswered, left voicemail to return call. Routing to practice for no contact X 3 follow up.   2nd call attempt went unanswered, left voicemail to return call.   1st call attempt went unanswered, left voicemail to return call.   Copied from CRM [id number]. Topic: Clinical - Medical Advice >> Jun 20, 2023  8:57 AM Jyl Or S wrote: Reason for CRM: Patient called wanted to know if Dr Adan Holms can call in a prescription patient says she has a rash on the back of her neck. Patient stated she has tried to put cream on it but it not clearing up. Please follow up with patient

## 2023-06-20 NOTE — Telephone Encounter (Signed)
 Chief Complaint: Rash Symptoms: neck rash, on the back of the neck, dry and discolored skin, itching and burning feeling.  Frequency: Constant onset about 2 days ago Pertinent Negatives: Patient denies fever, bumps, redness, infection  Disposition: [] ED /[] Urgent Care (no appt availability in office) / [x] Appointment(In office/virtual)/ []  [facility] [facility]/ [] Home Care/ [] Refused Recommended Disposition /[] [facility]/ []  Follow-up with PCP Additional Notes: Patient states she has a rash on the back of her neck that appeared about 2 days ago. Patient states the rash is itching and has a slight burning feeling to it. Patient reports using a vasoline cream that is not improving the symptoms. Care advice was given and patient states she had this kind of rash before under her breast and was told it was a diabetic rash. Patient has been scheduled for the first available appointment in clinic next week and advised to try Hydrocortisone cream for the itching. Patient asking for additional recommendations from PCP at this time.   Copied from CRM [id number]. Topic: Clinical - Medical Advice >> Jun 20, 2023  8:57 AM Jyl Or S wrote: Reason for CRM: Patient called wanted to know if Dr Adan Holms can call in a prescription patient says she has a rash on the back of her neck. Patient stated she has tried to put cream on it but it not clearing up. Please follow up with patient Reason for Disposition  [1] Applying cream or ointment AND [2] causes severe itch, burning or pain  Answer Assessment - Initial Assessment Questions 1. APPEARANCE of RASH: "Describe the rash."      Dry skin  2. LOCATION: "Where is the rash located?"      Back of neck  3. NUMBER: "How many spots are there?"      1  4. SIZE: "How big are the spots?" (Inches, centimeters or compare to size of a coin)      Covering most of the back of the neck  5. ONSET: "When did the rash start?"      About 2 days ago  6. ITCHING: "Does the  rash itch?" If Yes, ask: "How bad is the itch?"  (Scale 0-10; or none, mild, moderate, severe)    Mild to  Moderate 7. PAIN: "Does the rash hurt?" If Yes, ask: "How bad is the pain?"  (Scale 0-10; or none, mild, moderate, severe)    - NONE (0): no pain    - MILD (1-3): doesn't interfere with normal activities     - MODERATE (4-7): interferes with normal activities or awakens from sleep     - SEVERE (8-10): excruciating pain, unable to do any normal activities     Mild  8. OTHER SYMPTOMS: "Do you have any other symptoms?" (e.g., fever)     Mild burning feeling, dry and dark colored skin  9. PREGNANCY: "Is there any chance you are pregnant?" "When was your last menstrual period?"     N/A  Protocols used: Rash or Redness - Localized-A-AH

## 2023-06-20 NOTE — Telephone Encounter (Signed)
 Reason for Disposition ?? Third attempt to contact caller AND no contact made. Phone number verified. ? ?Protocols used: No Contact or Duplicate Contact Call-A-AH ? ?

## 2023-06-28 ENCOUNTER — Ambulatory Visit: Payer: Self-pay | Admitting: Physician Assistant

## 2023-07-06 ENCOUNTER — Other Ambulatory Visit (HOSPITAL_COMMUNITY): Payer: Self-pay

## 2023-07-11 ENCOUNTER — Other Ambulatory Visit (HOSPITAL_COMMUNITY): Payer: Self-pay

## 2023-07-11 ENCOUNTER — Other Ambulatory Visit: Payer: Self-pay

## 2023-07-27 ENCOUNTER — Ambulatory Visit: Payer: Self-pay | Admitting: *Deleted

## 2023-07-27 NOTE — Telephone Encounter (Signed)
 Copied from CRM [id number]. Topic: Clinical - Red Word Triage >> Jul 27, 2023  8:32 AM Carlatta H wrote: [facility] that prompted transfer to Nurse Triage: Patients blood pressure has been running high for about 2 days//xxx/xxx Reason for Disposition  Systolic BP  >= 160 OR Diastolic >= 100    Had one episode of dizziness and blurry vision on Tues. But not since.  Answer Assessment - Initial Assessment Questions 1. BLOOD PRESSURE: "What is the blood pressure?" "Did you take at least two measurements 5 minutes apart?"     My BP is high  185/101 Tues. Night.   Yesterday it was 101/95.  I took extra lisinopril .   It went down some.   I have a light headache today.    I was on Tues. Having dizziness but not now.   My vision was blurry on Tues.  Today no dizziness or blurry vision. 2. ONSET: "When did you take your blood pressure?"     This morning I don't remember what it was.   I came to work at 2:30 AM.   I'm still at work. 3. HOW: "How did you take your blood pressure?" (e.g., automatic home BP monitor, visiting nurse)     BP machine at home 4. HISTORY: "Do you have a history of high blood pressure?"     Yes 5. MEDICINES: "Are you taking any medicines for blood pressure?" "Have you missed any doses recently?"     Lisinopril  and another one.   I took an extra lisinopril  last night before coming to work.    6. OTHER SYMPTOMS: "Do you have any symptoms?" (e.g., blurred vision, chest pain, difficulty breathing, headache, weakness)     Tues. Had blurry vision and dizziness.   But not now. Tues when I noticed my BP being high and these symptoms.  7. PREGNANCY: "Is there any chance you are pregnant?" "When was your last menstrual period?"     N/A  Protocols used: Blood Pressure - High-A-AH  Chief Complaint: elevated BP,   On Tues. Had episode of dizziness and blurry vision but not since.   Symptoms: Mild headache now.  She took an extra lisinopril  07/27/2023 before going to work at 2:30 AM because her  BP was elevated.   See triage notes for readings.   The extra lisinopril  brought BP down but since she was at work unable to measure it.   Frequency: Dizziness and blurry once on Tues. 07/25/2023 but not since. Pertinent Negatives: Patient denies Blurry vision or dizziness since Tues. Disposition: [] ED /[] Urgent Care (no appt availability in office) / [x] Appointment(In office/virtual)/ []  [facility]/ [] Home Care/ [] Refused Recommended Disposition /[] [place] [facility]/ []  Follow-up with PCP Additional Notes: No appts available with any of the providers until the end of June 2025.    High priority message sent to see if she can be worked in.   I went over the s/s to go to the ED:  dizziness, blurry vision, off balance, slurred speech, confusion, one side of body feels numb or weak.   Pt verbalized understanding and was agreeable to someone calling her back.

## 2023-07-27 NOTE — Telephone Encounter (Signed)
 LVM for return call, also sent patient a MyChart message.

## 2023-08-17 ENCOUNTER — Other Ambulatory Visit: Payer: Self-pay

## 2023-08-17 ENCOUNTER — Encounter: Payer: Self-pay | Admitting: Family Medicine

## 2023-08-17 ENCOUNTER — Ambulatory Visit: Attending: Family Medicine | Admitting: Family Medicine

## 2023-08-17 VITALS — BP 146/82 | HR 88 | Ht 65.0 in | Wt 182.4 lb

## 2023-08-17 DIAGNOSIS — Z23 Encounter for immunization: Secondary | ICD-10-CM | POA: Diagnosis not present

## 2023-08-17 DIAGNOSIS — Z7985 Long-term (current) use of injectable non-insulin antidiabetic drugs: Secondary | ICD-10-CM

## 2023-08-17 DIAGNOSIS — Z7984 Long term (current) use of oral hypoglycemic drugs: Secondary | ICD-10-CM | POA: Diagnosis not present

## 2023-08-17 DIAGNOSIS — I10 Essential (primary) hypertension: Secondary | ICD-10-CM

## 2023-08-17 DIAGNOSIS — E1142 Type 2 diabetes mellitus with diabetic polyneuropathy: Secondary | ICD-10-CM | POA: Diagnosis not present

## 2023-08-17 DIAGNOSIS — E785 Hyperlipidemia, unspecified: Secondary | ICD-10-CM

## 2023-08-17 DIAGNOSIS — Z794 Long term (current) use of insulin: Secondary | ICD-10-CM

## 2023-08-17 DIAGNOSIS — I152 Hypertension secondary to endocrine disorders: Secondary | ICD-10-CM

## 2023-08-17 DIAGNOSIS — E1169 Type 2 diabetes mellitus with other specified complication: Secondary | ICD-10-CM

## 2023-08-17 DIAGNOSIS — G44209 Tension-type headache, unspecified, not intractable: Secondary | ICD-10-CM

## 2023-08-17 LAB — POCT GLYCOSYLATED HEMOGLOBIN (HGB A1C): HbA1c, POC (controlled diabetic range): 5.7 % (ref 0.0–7.0)

## 2023-08-17 MED ORDER — CARVEDILOL 6.25 MG PO TABS
6.2500 mg | ORAL_TABLET | Freq: Two times a day (BID) | ORAL | 1 refills | Status: DC
  Filled 2023-08-17: qty 60, 30d supply, fill #0
  Filled 2023-10-05: qty 60, 30d supply, fill #1

## 2023-08-17 MED ORDER — INSULIN GLARGINE-YFGN 100 UNIT/ML ~~LOC~~ SOPN
PEN_INJECTOR | SUBCUTANEOUS | 6 refills | Status: DC
  Filled 2023-08-17: qty 15, 25d supply, fill #0

## 2023-08-17 MED ORDER — ACETAMINOPHEN 325 MG PO TABS
650.0000 mg | ORAL_TABLET | Freq: Once | ORAL | Status: AC
  Administered 2023-08-17: 650 mg via ORAL

## 2023-08-17 NOTE — Patient Instructions (Signed)
 VISIT SUMMARY:  Today, we discussed your blood pressure, diabetes, cholesterol, and eye health. We made some changes to your medications to better manage your conditions and provided recommendations for your overall health maintenance.  YOUR PLAN:  -HYPERTENSION: Hypertension means high blood pressure. Your recent readings have been high, causing headaches and blurry vision. We are switching your medication from metoprolol  to carvedilol to help control your blood pressure better. Please take carvedilol 6.25 mg twice a day and monitor your blood pressure at home. Follow up in one month. You can take acetaminophen  for headaches.  -TYPE 2 DIABETES MELLITUS: Type 2 Diabetes Mellitus is a condition where your body does not use insulin  properly, leading to high blood sugar levels. Your A1c is well-controlled at 5.7%, but we are adjusting your insulin  doses to prevent low blood sugar. Please reduce your insulin  to 35 units in the morning and 25 units in the evening. Continue taking Farxiga  and Mounjaro  as prescribed. Monitor your blood sugar and adjust insulin  as needed.  -HYPERLIPIDEMIA: Hyperlipidemia means high cholesterol levels in your blood. You are currently on Crestor  and Zetia  to manage this. We will check your cholesterol levels with blood work. Please continue taking Crestor  and Zetia  as prescribed.  -CATARACTS: Cataracts are a clouding of the eye's lens, which can affect vision. You were informed of having cataracts during your eye exam. We will obtain your ophthalmologist's name and send a letter to get more information.  -GENERAL HEALTH MAINTENANCE: For your overall health, you need a pneumonia vaccine because of your diabetes. We will administer the pneumonia vaccine today. We have documented your recent eye exam and will follow up with your eye care provider.  INSTRUCTIONS:  Please follow up in one month to monitor your blood pressure. Continue to monitor your blood sugar levels and adjust  your insulin  as needed. We will also check your cholesterol levels with blood work. Make sure to get your pneumonia vaccine today.

## 2023-08-17 NOTE — Progress Notes (Signed)
 Subjective:  Patient ID: Mariah Miller, female    DOB: [birth year]  Age: 48 y.o. MRN: [id number]  CC: Headache (Blood pressure)     History of Present Illness  Discussed the use of AI scribe software for clinical note transcription with the patient, who gave verbal consent to proceed.  History of Present Illness   Mariah Miller is a 48 year old female with a history of type 2 diabetes mellitus, hyperlipidemia, hypertension, depression who presents for follow-up of her blood pressure and diabetes management.  Her blood pressure recently elevated to 185/101 mmHg, causing severe headaches and blurry vision. These symptoms occur with high blood pressure. She monitors her salt intake and reports no excessive stress.  BP in the clinic today is 146/82 and she endorses adherence with her antihypertensive. Her A1c is 5.7%. She has lost 24 pounds, and her blood sugars are stable, with occasional highs around 185 mg/dL. She takes Farxiga , Mounjaro  12.5 mg weekly, and insulin  (37 units in the morning and 28 units in the evening). Humalog  is rarely used.  At her last visit she had experienced hypoglycemic episodes and her insulin  dose had been decreased.  She rarely needs NovoLog . She had an eye exam earlier this year and received new glasses. She was informed of having a 'touch of cataract.  Her cholesterol was elevated in March.  Zetia  was added to her regimen but she had also endorsed ingesting high cholesterol foods but now she avoids fried foods and ham, especially after family gatherings. She is on Crestor  and Zetia .  '         Past Medical History:  Diagnosis Date   Depression    Diabetes mellitus without complication (HCC)    Hyperlipidemia    Hypertension    Migraine    Seizures (HCC)    self reported - last seizure was Oct 2019 absent seizure    Past Surgical History:  Procedure Laterality Date   CHOLECYSTECTOMY     CYST EXCISION     SHOULDER OPEN ROTATOR CUFF  REPAIR Left    TONSILLECTOMY     TUBAL LIGATION      Family History  Problem Relation Age of Onset   Hypertension Mother    Thyroid disease Mother    Irregular heart beat Mother    Bipolar disorder Sister    Colon cancer Other        great uncle   Esophageal cancer Neg Hx    Rectal cancer Neg Hx    Stomach cancer Neg Hx     Social History   Socioeconomic History   Marital status: Divorced    Spouse name: Not on file   Number of children: Not on file   Years of education: Not on file   Highest education level: Not on file  Occupational History    Comment: Assembler   Tobacco Use   Smoking status: Former    Types: Cigarettes   Smokeless tobacco: Never   Tobacco comments:    Smoked for 4 years, quit age 43  Vaping Use   Vaping status: Never Used  Substance and Sexual Activity   Alcohol use: Not Currently   Drug use: No   Sexual activity: Yes    Birth control/protection: None    Comment: Starteds menstral cycle today 12-12-2018 pt reported  Other Topics Concern   Not on file  Social History Narrative   Not on file   Social Drivers of Health   Financial Resource Strain:  Not on file  Food Insecurity: Not on file  Transportation Needs: Not on file  Physical Activity: Not on file  Stress: Not on file  Social Connections: Not on file    Allergies  Allergen Reactions   Tomato Itching and Rash    Outpatient Medications Prior to Visit  Medication Sig Dispense Refill   albuterol  (VENTOLIN  HFA) 108 (90 Base) MCG/ACT inhaler Inhale 2 puffs into the lungs every 6 (six) hours as needed for wheezing or shortness of breath. 8 g 0   amLODipine  (NORVASC ) 10 MG tablet Take 1 tablet (10 mg total) by mouth daily. 90 tablet 1   benzonatate  (TESSALON ) 100 MG capsule Take 1 capsule (100 mg total) by mouth 3 (three) times daily as needed for cough. 30 capsule 0   dapagliflozin  propanediol (FARXIGA ) 10 MG TABS tablet Take 1 tablet (10 mg total) by mouth daily before breakfast.  90 tablet 1   DULoxetine  (CYMBALTA ) 60 MG capsule Take 1 capsule (60 mg total) by mouth daily. 30 capsule 3   ergocalciferol  (DRISDOL ) 1.25 MG (50000 UT) capsule Take 1 capsule (50,000 Units total) by mouth once a week. 12 capsule 1   ezetimibe  (ZETIA ) 10 MG tablet Take 1 tablet (10 mg total) by mouth daily. 90 tablet 3   gabapentin  (NEURONTIN ) 300 MG capsule Take 2 capsules (600 mg total) by mouth 3 (three) times daily. 180 capsule 6   glucose blood (TRUE METRIX BLOOD GLUCOSE TEST) test strip Use as instructed (Patient taking differently: 1 each by Other route as directed.) 100 each 12   insulin  lispro (HUMALOG ) 100 UNIT/ML injection Inject 0.01 mLs (1 Units total) into the skin as needed for high blood sugar. Sliding scale needed. 10 mL 3   linaclotide  (LINZESS ) 72 MCG capsule Take 1 capsule (72 mcg total) by mouth daily before breakfast. 90 capsule 1   lisinopril -hydrochlorothiazide  (ZESTORETIC ) 20-12.5 MG tablet TAKE 2 TABLETS BY MOUTH ONCE DAILY. 180 tablet 1   meloxicam  (MOBIC ) 15 MG tablet Take 1 tablet (15 mg total) by mouth daily. 30 tablet 0   metFORMIN  (GLUCOPHAGE ) 1000 MG tablet Take 1 tablet by mouth 2 (two) times daily with a meal. 180 tablet 1   rosuvastatin  (CRESTOR ) 40 MG tablet Take 1 tablet (40 mg total) by mouth daily.     tirzepatide  (MOUNJARO ) 12.5 MG/0.5ML Pen Inject 12.5 mg into the skin once a week. 6 mL 6   TRUEplus Lancets 28G MISC USE AS INSTRUCTED (Patient taking differently: 1 each by Other route as directed.) 100 each 5   insulin  glargine-yfgn (SEMGLEE , YFGN,) 100 UNIT/ML Pen Inject 37 Units into the skin in the morning AND 28 Units every evening. 30 mL 3   metoprolol  succinate (TOPROL -XL) 100 MG 24 hr tablet Take 1 tablet (100 mg total) by mouth daily. 90 tablet 1   No facility-administered medications prior to visit.     ROS Review of Systems  Constitutional:  Negative for activity change and appetite change.  HENT:  Negative for sinus pressure and sore  throat.   Respiratory:  Negative for chest tightness, shortness of breath and wheezing.   Cardiovascular:  Negative for chest pain and palpitations.  Gastrointestinal:  Negative for abdominal distention, abdominal pain and constipation.  Genitourinary: Negative.   Musculoskeletal: Negative.   Neurological:  Positive for headaches.  Psychiatric/Behavioral:  Negative for behavioral problems and dysphoric mood.     Objective:  BP (!) 146/82   Pulse 88   Ht 5' 5 (1.651 m)  Wt 182 lb 6.4 oz (82.7 kg)   SpO2 100%   BMI 30.35 kg/m      08/17/2023   12:02 PM 08/17/2023   11:19 AM 11/21/2022    2:43 PM  BP/Weight  Systolic BP 146 145 157  Diastolic BP 82 81 83  Wt. (Lbs)  182.4   BMI  30.35 kg/m2     Wt Readings from Last 3 Encounters:  08/17/23 182 lb 6.4 oz (82.7 kg)  11/21/22 208 lb 9.6 oz (94.6 kg)  04/27/22 206 lb (93.4 kg)      Physical Exam Constitutional:      Appearance: She is well-developed.   Cardiovascular:     Rate and Rhythm: Normal rate.     Heart sounds: Normal heart sounds. No murmur heard. Pulmonary:     Effort: Pulmonary effort is normal.     Breath sounds: Normal breath sounds. No wheezing or rales.  Chest:     Chest wall: No tenderness.  Abdominal:     General: Bowel sounds are normal. There is no distension.     Palpations: Abdomen is soft. There is no mass.     Tenderness: There is no abdominal tenderness.   Musculoskeletal:        General: Normal range of motion.     Right lower leg: No edema.     Left lower leg: No edema.   Neurological:     Mental Status: She is alert and oriented to person, place, and time.   Psychiatric:        Mood and Affect: Mood normal.        Latest Ref Rng & Units 05/17/2023    9:20 AM 11/21/2022    2:56 PM 04/06/2022    8:43 AM  CMP  Glucose 70 - 99 mg/dL 89  95  161   BUN 6 - 24 mg/dL 8  10  6    Creatinine 0.57 - 1.00 mg/dL 0.96  0.45  4.09   Sodium 134 - 144 mmol/L 138  142  140   Potassium 3.5 -  5.2 mmol/L 4.3  4.0  4.8   Chloride 96 - 106 mmol/L 104  104  105   CO2 20 - 29 mmol/L 20  21  21    Calcium  8.7 - 10.2 mg/dL 9.0  9.6  9.4   Total Protein 6.0 - 8.5 g/dL 7.2  7.3  6.9   Total Bilirubin 0.0 - 1.2 mg/dL 0.5  0.3  0.2   Alkaline Phos 44 - 121 IU/L 81  100  81   AST 0 - 40 IU/L 15  10  12    ALT 0 - 32 IU/L 7  12  8      Lipid Panel     Component Value Date/Time   CHOL 197 05/17/2023 0920   TRIG 109 05/17/2023 0920   HDL 39 (L) 05/17/2023 0920   CHOLHDL 4.3 10/28/2020 0956   CHOLHDL 4.7 05/25/2016 1543   VLDL 37 (H) 05/25/2016 1543   LDLCALC 138 (H) 05/17/2023 0920    CBC    Component Value Date/Time   WBC CANCELED 09/20/2021 1147   WBC 8.0 11/03/2020 0648   RBC CANCELED 09/20/2021 1147   RBC 4.16 11/03/2020 0648   HGB CANCELED 09/20/2021 1147   HCT CANCELED 09/20/2021 1147   PLT CANCELED 09/20/2021 1147   MCV 90.9 11/03/2020 0648   MCH 29.6 11/03/2020 0648   MCHC 32.5 11/03/2020 0648   RDW 13.3 11/03/2020 0648   LYMPHSABS  CANCELED 09/20/2021 1147   MONOABS 0.5 10/01/2020 0835   EOSABS CANCELED 09/20/2021 1147   BASOSABS CANCELED 09/20/2021 1147    Lab Results  Component Value Date   HGBA1C 5.7 08/17/2023      1. Type 2 diabetes mellitus with diabetic polyneuropathy, with long-term current use of insulin  (HCC) (Primary) Controlled with A1c of 5.7 I have decreased her dose of Semglee  to 35 units in the morning and 20 5 in the evening to prevent hypoglycemia Continue Farxiga , Mounjaro , metformin  Counseled on Diabetic diet, my plate method, 161 minutes of moderate intensity exercise/week Blood sugar logs with fasting goals of 80-120 mg/dl, random of less than 096 and in the event of sugars less than 60 mg/dl or greater than 045 mg/dl encouraged to notify the clinic. Advised on the need for annual eye exams, annual foot exams, Pneumonia vaccine. - POCT glycosylated hemoglobin (Hb A1C) - insulin  glargine-yfgn (SEMGLEE , YFGN,) 100 UNIT/ML Pen; Inject 35  Units into the skin in the morning AND 25 Units every evening.  Dispense: 30 mL; Refill: 6 - Microalbumin / creatinine urine ratio - LP+Non-HDL Cholesterol - CMP14+EGFR  2. Hypertension associated with diabetes (HCC) Uncontrolled Metoprolol  substituted with carvedilol Continue lisinopril /HCTZ and amlodipine  Counseled on blood pressure goal of less than 130/80, low-sodium, DASH diet, medication compliance, 150 minutes of moderate intensity exercise per week. Discussed medication compliance, adverse effects. - carvedilol (COREG) 6.25 MG tablet; Take 1 tablet (6.25 mg total) by mouth 2 (two) times daily with a meal.  Dispense: 180 tablet; Refill: 1  3. Long term current use of oral hypoglycemic drug See #1 above  4. Long-term current use of injectable noninsulin antidiabetic medication See #1 above  5. Long term current use of insulin  (HCC) See #1 above  6. Tension-type headache, not intractable, unspecified chronicity pattern Intermittent and noticed with episodes of elevated blood pressure Antihypertensive regimen has been adjusted which should bring about some improvement - acetaminophen  (TYLENOL ) tablet 650 mg  7. Hyperlipidemia associated with type 2 diabetes mellitus (HCC) Uncontrolled with LDL of 138 above goal of less than 100 Currently on maximum dose of Crestor  and Zetia  Low-cholesterol diet If LDL remains above goal with blood work, consider referral for PCSK9 therapy  8. Need for Streptococcus pneumoniae vaccination - Pneumococcal conjugate vaccine 20-valent   Meds ordered this encounter  Medications   insulin  glargine-yfgn (SEMGLEE , YFGN,) 100 UNIT/ML Pen    Sig: Inject 35 Units into the skin in the morning AND 25 Units every evening.    Dispense:  30 mL    Refill:  6    Dose decrease   carvedilol (COREG) 6.25 MG tablet    Sig: Take 1 tablet (6.25 mg total) by mouth 2 (two) times daily with a meal.    Dispense:  180 tablet    Refill:  1    Discontinue  Metoprolol    acetaminophen  (TYLENOL ) tablet 650 mg    Follow-up: Return in about 1 month (around 09/16/2023) for Blood Pressure follow-up with PCP.       Joaquin Mulberry, MD, FAAFP. [facility] [place], [place] [phone]   08/17/2023, 12:32 PM

## 2023-08-18 ENCOUNTER — Other Ambulatory Visit: Payer: Self-pay

## 2023-08-18 ENCOUNTER — Ambulatory Visit: Payer: Self-pay | Admitting: Family Medicine

## 2023-08-19 LAB — LP+NON-HDL CHOLESTEROL
Cholesterol, Total: 174 mg/dL (ref 100–199)
HDL: 41 mg/dL
LDL Chol Calc (NIH): 110 mg/dL — ABNORMAL HIGH (ref 0–99)
Total Non-HDL-Chol (LDL+VLDL): 133 mg/dL — ABNORMAL HIGH (ref 0–129)
Triglycerides: 125 mg/dL (ref 0–149)
VLDL Cholesterol Cal: 23 mg/dL (ref 5–40)

## 2023-08-19 LAB — CMP14+EGFR
ALT: 10 IU/L (ref 0–32)
AST: 16 IU/L (ref 0–40)
Albumin: 4.2 g/dL (ref 3.9–4.9)
Alkaline Phosphatase: 81 IU/L (ref 44–121)
BUN/Creatinine Ratio: 10 (ref 9–23)
BUN: 8 mg/dL (ref 6–24)
Bilirubin Total: 0.2 mg/dL (ref 0.0–1.2)
CO2: 21 mmol/L (ref 20–29)
Calcium: 9.3 mg/dL (ref 8.7–10.2)
Chloride: 106 mmol/L (ref 96–106)
Creatinine, Ser: 0.79 mg/dL (ref 0.57–1.00)
Globulin, Total: 2.8 g/dL (ref 1.5–4.5)
Glucose: 88 mg/dL (ref 70–99)
Potassium: 5.1 mmol/L (ref 3.5–5.2)
Sodium: 139 mmol/L (ref 134–144)
Total Protein: 7 g/dL (ref 6.0–8.5)
eGFR: 93 mL/min/1.73

## 2023-08-19 LAB — MICROALBUMIN / CREATININE URINE RATIO
Creatinine, Urine: 446.9 mg/dL
Microalb/Creat Ratio: 9 mg/g{creat} (ref 0–29)
Microalbumin, Urine: 38 ug/mL

## 2023-09-12 ENCOUNTER — Other Ambulatory Visit: Payer: Self-pay

## 2023-09-29 ENCOUNTER — Telehealth: Payer: Self-pay | Admitting: Family Medicine

## 2023-09-29 NOTE — Telephone Encounter (Signed)
 Called patient, no answer. Left voicemail confirming upcoming appointment on 10/03/2023 at 9:10 am with Dr.Newlin. Provided callback number for any questions or changes.

## 2023-10-03 ENCOUNTER — Ambulatory Visit: Admitting: Family Medicine

## 2023-10-05 ENCOUNTER — Other Ambulatory Visit: Payer: Self-pay

## 2023-10-05 MED ORDER — NORETHINDRONE 0.35 MG PO TABS
1.0000 | ORAL_TABLET | Freq: Every day | ORAL | 1 refills | Status: DC
  Filled 2023-10-05: qty 28, 28d supply, fill #0
  Filled 2023-11-13: qty 28, 28d supply, fill #1
  Filled 2023-12-13: qty 28, 28d supply, fill #2
  Filled 2024-01-10: qty 28, 28d supply, fill #3

## 2023-10-11 ENCOUNTER — Other Ambulatory Visit: Payer: Self-pay

## 2023-10-12 ENCOUNTER — Other Ambulatory Visit: Payer: Self-pay

## 2023-10-20 ENCOUNTER — Ambulatory Visit: Payer: Self-pay

## 2023-10-20 DIAGNOSIS — Z794 Long term (current) use of insulin: Secondary | ICD-10-CM

## 2023-10-20 MED ORDER — INSULIN GLARGINE-YFGN 100 UNIT/ML ~~LOC~~ SOPN: PEN_INJECTOR | SUBCUTANEOUS | Status: DC

## 2023-10-20 NOTE — Addendum Note (Signed)
 Addended by: Darby Shadwick on: 10/20/2023 01:30 PM   Modules accepted: Orders

## 2023-10-20 NOTE — Telephone Encounter (Signed)
   FYI Only or Action Required?: Action required by provider: wants to know if she can .  Patient was last seen in primary care on 08/17/2023 by Newlin, Enobong, MD.  Called Nurse Triage reporting Hypoglycemia.  Symptoms began yesterday.  Interventions attempted: Nothing.  Symptoms are: unchanged.  Triage Disposition: Urgent Home Treatment With Follow-up Call  Patient/caregiver understands and will follow disposition?:     Copied from CRM #[id number]. Topic: Clinical - Red Word Triage >> Oct 20, 2023 12:55 PM Thliyah D wrote: Sugar kept dropping in sleep last night and this morning. Wants to know if she dosage needs to be lowered at night. Levels 40, 50 ,60 Reason for Disposition  [1] Low blood sugar symptoms with no other adult present AND [2] hasn't tried Care Advice  Answer Assessment - Initial Assessment Questions 1. SYMPTOMS: What symptoms are you concerned about?     States bgl droops at night before sleeping.  States takes insulin  at night and wants to know if she can reduce dose at night 2. ONSET:  When did the symptoms start?     yesterday 3. BLOOD GLUCOSE: What is your blood glucose level?  40 this am, states drank juice 4. USUAL RANGE: What is your blood glucose level usually? (e.g., usual fasting morning value, usual evening value)     120 5. TYPE 1 or 2:  Do you know what type of diabetes you have?  (e.g., Type 1, Type 2, Gestational; doesn't know)      Type 2  Protocols used: Diabetes - Low Blood Sugar-A-AH

## 2023-10-20 NOTE — Telephone Encounter (Signed)
 Please advise her to decrease insulin  in the morning to 32 units in evening to 22 units.  Thanks.

## 2023-10-20 NOTE — Telephone Encounter (Signed)
 Spoke with patient advise per PCP advise her to decrease insulin  in the morning to 32 units in evening to 22 units. Patient voiced understanding and had not questions

## 2023-11-03 ENCOUNTER — Telehealth: Payer: Self-pay | Admitting: Family Medicine

## 2023-11-03 NOTE — Telephone Encounter (Signed)
 Pt unconfirmed appt 8/29 lvm

## 2023-11-07 ENCOUNTER — Ambulatory Visit: Admitting: Family Medicine

## 2023-11-13 ENCOUNTER — Other Ambulatory Visit (HOSPITAL_COMMUNITY): Payer: Self-pay

## 2023-11-13 ENCOUNTER — Other Ambulatory Visit: Payer: Self-pay

## 2023-11-19 ENCOUNTER — Encounter (HOSPITAL_COMMUNITY): Payer: Self-pay

## 2023-11-19 ENCOUNTER — Ambulatory Visit (HOSPITAL_COMMUNITY): Admission: EM | Admit: 2023-11-19 | Discharge: 2023-11-19 | Disposition: A | Discharge status: Home / Self Care

## 2023-11-19 ENCOUNTER — Ambulatory Visit (INDEPENDENT_AMBULATORY_CARE_PROVIDER_SITE_OTHER)

## 2023-11-19 DIAGNOSIS — S46911A Strain of unspecified muscle, fascia and tendon at shoulder and upper arm level, right arm, initial encounter: Secondary | ICD-10-CM | POA: Diagnosis not present

## 2023-11-19 DIAGNOSIS — M25511 Pain in right shoulder: Secondary | ICD-10-CM | POA: Diagnosis not present

## 2023-11-19 MED ORDER — DICLOFENAC SODIUM 50 MG PO TBEC: 50.0000 mg | DELAYED_RELEASE_TABLET | Freq: Two times a day (BID) | ORAL | 1 refills | Status: AC

## 2023-11-19 MED ORDER — BACLOFEN 10 MG PO TABS: 10.0000 mg | ORAL_TABLET | Freq: Three times a day (TID) | ORAL | 0 refills | Status: AC

## 2023-11-19 NOTE — ED Provider Notes (Signed)
 [facility] [place]  Note:  This document was prepared using Conservation officer, historic buildings and may include unintentional dictation errors.  MRN: [id number] DOB: 1975-04-13  Subjective:   Mariah Miller is a 48 y.o. female presenting for right shoulder pain since Thursday morning.  Patient reports that she woke up Thursday morning with severe right shoulder pain with difficulty moving and difficulty performing ADLs.  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  Patient denies any known injury or trauma but states that she was using the hoist at work quite a bit on Wednesday prior to the onset of symptoms.  Patient is worried that continued work has caused symptoms to progress.  Patient reports taking extra strength Tylenol  with no improvement to symptoms.  Patient denies any prior history of shoulder injury or trauma.  No current facility-administered medications for this encounter.  Current Outpatient Medications:    amLODipine  (NORVASC ) 10 MG tablet, Take 1 tablet (10 mg total) by mouth daily., Disp: 90 tablet, Rfl: 1   baclofen  (LIORESAL ) 10 MG tablet, Take 1 tablet (10 mg total) by mouth 3 (three) times daily., Disp: 30 each, Rfl: 0   dapagliflozin  propanediol (FARXIGA ) 10 MG TABS tablet, Take 1 tablet (10 mg total) by mouth daily before breakfast., Disp: 90 tablet, Rfl: 1   diclofenac  (VOLTAREN ) 50 MG EC tablet, Take 1 tablet (50 mg total) by mouth 2 (two) times daily., Disp: 30 tablet, Rfl: 1   DULoxetine  (CYMBALTA ) 60 MG capsule, Take 1 capsule (60 mg total) by mouth daily., Disp: 30 capsule, Rfl: 3   ezetimibe  (ZETIA ) 10 MG tablet, Take 1 tablet (10 mg total) by mouth daily., Disp: 90 tablet, Rfl: 3   gabapentin  (NEURONTIN ) 300 MG capsule, Take 2 capsules (600 mg total) by mouth 3 (three) times daily., Disp: 180 capsule, Rfl: 6   insulin  glargine-yfgn (SEMGLEE , YFGN,) 100 UNIT/ML Pen, Inject 32 Units  into the skin in the morning AND 22 Units every evening., Disp: , Rfl:    insulin  lispro (HUMALOG ) 100 UNIT/ML injection, Inject 0.01 mLs (1 Units total) into the skin as needed for high blood sugar. Sliding scale needed., Disp: 10 mL, Rfl: 3   linaclotide  (LINZESS ) 72 MCG capsule, Take 1 capsule (72 mcg total) by mouth daily before breakfast., Disp: 90 capsule, Rfl: 1   lisinopril -hydrochlorothiazide  (ZESTORETIC ) 20-12.5 MG tablet, TAKE 2 TABLETS BY MOUTH ONCE DAILY., Disp: 180 tablet, Rfl: 1   metFORMIN  (GLUCOPHAGE ) 1000 MG tablet, Take 1 tablet by mouth 2 (two) times daily with a meal., Disp: 180 tablet, Rfl: 1   norethindrone  (MICRONOR ) 0.35 MG tablet, Take 1 tablet (0.35 mg total) by mouth daily., Disp: 84 tablet, Rfl: 1   rosuvastatin  (CRESTOR ) 40 MG tablet, Take 1 tablet (40 mg total) by mouth daily., Disp: , Rfl:    tirzepatide  (MOUNJARO ) 12.5 MG/0.5ML Pen, Inject 12.5 mg into the skin once a week., Disp: 6 mL, Rfl: 6   ergocalciferol  (DRISDOL ) 1.25 MG (50000 UT) capsule, Take 1 capsule (50,000 Units total) by mouth once a week., Disp: 12 capsule, Rfl: 1   Allergies  Allergen Reactions   Tomato Itching and Rash    Past Medical History:  Diagnosis Date   Depression    Diabetes mellitus without complication (HCC)    Hyperlipidemia    Hypertension    Migraine    Seizures (HCC)    self reported - last seizure was Oct  2019 absent seizure     Past Surgical History:  Procedure Laterality Date   CHOLECYSTECTOMY     CYST EXCISION     SHOULDER OPEN ROTATOR CUFF REPAIR Left    TONSILLECTOMY     TUBAL LIGATION      Family History  Problem Relation Age of Onset   Hypertension Mother    Thyroid disease Mother    Irregular heart beat Mother    Bipolar disorder Sister    Colon cancer Other        great uncle   Esophageal cancer Neg Hx    Rectal cancer Neg Hx    Stomach cancer Neg Hx     Social History   Tobacco Use   Smoking status: Former    Types: Cigarettes    Smokeless tobacco: Never   Tobacco comments:    Smoked for 4 years, quit age 39  Vaping Use   Vaping status: Never Used  Substance Use Topics   Alcohol use: Not Currently   Drug use: No    ROS Refer to HPI for ROS details.  Objective:    Vitals: BP (!) 164/76 (BP Location: Left Arm)   Pulse 72   Temp 97.9 F (36.6 C) (Oral)   Resp 18   Ht 5' 5 (1.651 m)   Wt 167 lb (75.8 kg)   LMP 11/15/2023 (Approximate)   SpO2 99%   BMI 27.79 kg/m   Physical Exam Vitals and nursing note reviewed.  Constitutional:      General: She is not in acute distress.    Appearance: Normal appearance. She is well-developed. She is not ill-appearing or toxic-appearing.  HENT:     Head: Normocephalic and atraumatic.  Cardiovascular:     Rate and Rhythm: Normal rate.  Pulmonary:     Effort: Pulmonary effort is normal. No respiratory distress.  Musculoskeletal:     Right shoulder: Swelling, tenderness, bony tenderness and crepitus present. No deformity. Decreased range of motion. Decreased strength. Normal pulse.  Skin:    General: Skin is warm and dry.  Neurological:     General: No focal deficit present.     Mental Status: She is alert and oriented to person, place, and time.  Psychiatric:        Mood and Affect: Mood normal.        Behavior: Behavior normal.     Procedures  No results found for this or any previous visit (from the past 24 hours).  Assessment and Plan :     Discharge Instructions       1. Strain of right shoulder, initial encounter (Primary) - DG Shoulder Right x-ray performed in UC shows no acute fracture or dislocation of the right shoulder, no acute skeletal injury. - Apply Sling & Swathe for protection and immobilization until symptoms improve. - baclofen  (LIORESAL ) 10 MG tablet; Take 1 tablet (10 mg total) by mouth 3 (three) times daily.  Dispense: 30 each; Refill: 0 - diclofenac  (VOLTAREN ) 50 MG EC tablet; Take 1 tablet (50 mg total) by mouth 2 (two)  times daily.  Dispense: 30 tablet; Refill: 1 - AMB referral to orthopedics for follow-up evaluation and management of symptoms can continue with current treatment. -Continue to monitor symptoms for any change in severity if there is any escalation of current symptoms or development of new symptoms follow-up in ER for further evaluation and management.      Nikita Surman B Shunna Mikaelian   Corley Kohls, [place], [place] 11/19/23 1202

## 2023-11-19 NOTE — Discharge Instructions (Signed)
  1. Strain of right shoulder, initial encounter (Primary) - DG Shoulder Right x-ray performed in UC shows no acute fracture or dislocation of the right shoulder, no acute skeletal injury. - Apply Sling & Swathe for protection and immobilization until symptoms improve. - baclofen  (LIORESAL ) 10 MG tablet; Take 1 tablet (10 mg total) by mouth 3 (three) times daily.  Dispense: 30 each; Refill: 0 - diclofenac  (VOLTAREN ) 50 MG EC tablet; Take 1 tablet (50 mg total) by mouth 2 (two) times daily.  Dispense: 30 tablet; Refill: 1 - AMB referral to orthopedics for follow-up evaluation and management of symptoms can continue with current treatment. -Continue to monitor symptoms for any change in severity if there is any escalation of current symptoms or development of new symptoms follow-up in ER for further evaluation and management.

## 2023-11-19 NOTE — ED Triage Notes (Signed)
 Patient presenting with right shoulder pain onset this past Thursday morning when she woke up. States she can not put pressure on the shoulder or hardly lift it. Patient denies any known falls, injuries, or strain.   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

## 2023-12-25 ENCOUNTER — Other Ambulatory Visit (HOSPITAL_COMMUNITY): Payer: Self-pay

## 2023-12-25 ENCOUNTER — Other Ambulatory Visit: Payer: Self-pay

## 2023-12-26 ENCOUNTER — Other Ambulatory Visit: Payer: Self-pay

## 2024-02-07 ENCOUNTER — Encounter: Payer: Self-pay | Admitting: Pharmacist

## 2024-02-07 ENCOUNTER — Other Ambulatory Visit: Payer: Self-pay

## 2024-02-08 ENCOUNTER — Other Ambulatory Visit (HOSPITAL_COMMUNITY): Payer: Self-pay

## 2024-02-12 ENCOUNTER — Other Ambulatory Visit: Payer: Self-pay

## 2024-02-13 ENCOUNTER — Other Ambulatory Visit (HOSPITAL_COMMUNITY): Payer: Self-pay

## 2024-03-13 ENCOUNTER — Other Ambulatory Visit: Payer: Self-pay | Admitting: Family Medicine

## 2024-03-13 ENCOUNTER — Other Ambulatory Visit (HOSPITAL_COMMUNITY): Payer: Self-pay

## 2024-03-13 ENCOUNTER — Other Ambulatory Visit: Payer: Self-pay

## 2024-03-13 DIAGNOSIS — E1142 Type 2 diabetes mellitus with diabetic polyneuropathy: Secondary | ICD-10-CM

## 2024-03-13 MED ORDER — INSULIN GLARGINE 100 UNIT/ML SOLOSTAR PEN
PEN_INJECTOR | SUBCUTANEOUS | 0 refills | Status: AC
  Filled 2024-03-13 – 2024-03-22 (×2): qty 15, 25d supply, fill #0

## 2024-03-14 ENCOUNTER — Other Ambulatory Visit: Payer: Self-pay

## 2024-03-14 ENCOUNTER — Other Ambulatory Visit (HOSPITAL_COMMUNITY): Payer: Self-pay

## 2024-03-21 ENCOUNTER — Other Ambulatory Visit (HOSPITAL_COMMUNITY): Payer: Self-pay

## 2024-03-22 ENCOUNTER — Other Ambulatory Visit (HOSPITAL_COMMUNITY): Payer: Self-pay

## 2024-03-22 ENCOUNTER — Other Ambulatory Visit: Payer: Self-pay

## 2024-04-03 ENCOUNTER — Other Ambulatory Visit (HOSPITAL_COMMUNITY): Payer: Self-pay
